# Patient Record
Sex: Female | Born: 1935
Health system: Southern US, Community
[De-identification: ages and names within clinical notes are randomized; demographics above are authoritative.]

## PROBLEM LIST (undated history)

## (undated) DIAGNOSIS — B029 Zoster without complications: Secondary | ICD-10-CM

## (undated) DIAGNOSIS — M25519 Pain in unspecified shoulder: Secondary | ICD-10-CM

## (undated) DIAGNOSIS — A159 Respiratory tuberculosis unspecified: Secondary | ICD-10-CM

## (undated) DIAGNOSIS — D126 Benign neoplasm of colon, unspecified: Secondary | ICD-10-CM

## (undated) DIAGNOSIS — D509 Iron deficiency anemia, unspecified: Secondary | ICD-10-CM

## (undated) DIAGNOSIS — Z9849 Cataract extraction status, unspecified eye: Secondary | ICD-10-CM

## (undated) DIAGNOSIS — Z5189 Encounter for other specified aftercare: Secondary | ICD-10-CM

## (undated) DIAGNOSIS — M1990 Unspecified osteoarthritis, unspecified site: Secondary | ICD-10-CM

## (undated) DIAGNOSIS — K219 Gastro-esophageal reflux disease without esophagitis: Secondary | ICD-10-CM

## (undated) DIAGNOSIS — J45909 Unspecified asthma, uncomplicated: Secondary | ICD-10-CM

## (undated) DIAGNOSIS — K279 Peptic ulcer, site unspecified, unspecified as acute or chronic, without hemorrhage or perforation: Secondary | ICD-10-CM

## (undated) DIAGNOSIS — M545 Low back pain: Secondary | ICD-10-CM

## (undated) DIAGNOSIS — J301 Allergic rhinitis due to pollen: Secondary | ICD-10-CM

## (undated) DIAGNOSIS — Z8719 Personal history of other diseases of the digestive system: Secondary | ICD-10-CM

## (undated) DIAGNOSIS — E785 Hyperlipidemia, unspecified: Secondary | ICD-10-CM

## (undated) DIAGNOSIS — H905 Unspecified sensorineural hearing loss: Secondary | ICD-10-CM

## (undated) DIAGNOSIS — I10 Essential (primary) hypertension: Secondary | ICD-10-CM

## (undated) DIAGNOSIS — Z9189 Other specified personal risk factors, not elsewhere classified: Secondary | ICD-10-CM

## (undated) DIAGNOSIS — F419 Anxiety disorder, unspecified: Secondary | ICD-10-CM

## (undated) DIAGNOSIS — L659 Nonscarring hair loss, unspecified: Secondary | ICD-10-CM

## (undated) DIAGNOSIS — M25549 Pain in joints of unspecified hand: Secondary | ICD-10-CM

## (undated) DIAGNOSIS — Z9079 Acquired absence of other genital organ(s): Secondary | ICD-10-CM

## (undated) HISTORY — DX: Unspecified osteoarthritis, unspecified site: M19.90

## (undated) HISTORY — DX: Peptic ulcer, site unspecified, unspecified as acute or chronic, without hemorrhage or perforation: K27.9

## (undated) HISTORY — DX: Other specified personal risk factors, not elsewhere classified: Z91.89

## (undated) HISTORY — PX: POLYPECTOMY: SHX149

## (undated) HISTORY — DX: Benign neoplasm of colon, unspecified: D12.6

## (undated) HISTORY — DX: Iron deficiency anemia, unspecified: D50.9

## (undated) HISTORY — DX: Cataract extraction status, unspecified eye: Z98.49

## (undated) HISTORY — DX: Zoster without complications: B02.9

## (undated) HISTORY — DX: Respiratory tuberculosis unspecified: A15.9

## (undated) HISTORY — DX: Anxiety disorder, unspecified: F41.9

## (undated) HISTORY — PX: BREAST SURGERY: SHX581

## (undated) HISTORY — DX: Encounter for other specified aftercare: Z51.89

## (undated) HISTORY — DX: Pain in joints of unspecified hand: M25.549

## (undated) HISTORY — DX: Gastro-esophageal reflux disease without esophagitis: K21.9

## (undated) HISTORY — DX: Allergic rhinitis due to pollen: J30.1

## (undated) HISTORY — DX: Pain in unspecified shoulder: M25.519

## (undated) HISTORY — PX: COLONOSCOPY: SHX174

## (undated) HISTORY — DX: Unspecified asthma, uncomplicated: J45.909

## (undated) HISTORY — DX: Personal history of other diseases of the digestive system: Z87.19

## (undated) HISTORY — DX: Low back pain: M54.5

## (undated) HISTORY — DX: Unspecified sensorineural hearing loss: H90.5

## (undated) HISTORY — DX: Hyperlipidemia, unspecified: E78.5

## (undated) HISTORY — PX: TOTAL ABDOMINAL HYSTERECTOMY W/ BILATERAL SALPINGOOPHORECTOMY: SHX83

## (undated) HISTORY — PX: CATARACT EXTRACTION: SUR2

## (undated) HISTORY — DX: Essential (primary) hypertension: I10

## (undated) HISTORY — PX: ABDOMINAL HYSTERECTOMY: SHX81

## (undated) HISTORY — PX: IRRIGATION AND DEBRIDEMENT SEBACEOUS CYST: SHX5255

## (undated) HISTORY — PX: EYE SURGERY: SHX253

## (undated) HISTORY — DX: Acquired absence of other genital organ(s): Z90.79

## (undated) HISTORY — DX: Nonscarring hair loss, unspecified: L65.9

---

## 1995-06-16 ENCOUNTER — Encounter: Payer: Self-pay | Admitting: Internal Medicine

## 1998-03-05 ENCOUNTER — Other Ambulatory Visit: Admission: RE | Admit: 1998-03-05 | Discharge: 1998-03-05 | Payer: Self-pay | Admitting: *Deleted

## 1999-06-03 ENCOUNTER — Other Ambulatory Visit: Admission: RE | Admit: 1999-06-03 | Discharge: 1999-06-03 | Payer: Self-pay | Admitting: *Deleted

## 2000-08-25 ENCOUNTER — Other Ambulatory Visit: Admission: RE | Admit: 2000-08-25 | Discharge: 2000-08-25 | Payer: Self-pay | Admitting: *Deleted

## 2002-09-21 ENCOUNTER — Other Ambulatory Visit: Admission: RE | Admit: 2002-09-21 | Discharge: 2002-09-21 | Payer: Self-pay | Admitting: *Deleted

## 2003-09-16 ENCOUNTER — Emergency Department (HOSPITAL_COMMUNITY): Admission: EM | Admit: 2003-09-16 | Discharge: 2003-09-16 | Payer: Self-pay | Admitting: Emergency Medicine

## 2003-10-02 ENCOUNTER — Other Ambulatory Visit: Admission: RE | Admit: 2003-10-02 | Discharge: 2003-10-02 | Payer: Self-pay | Admitting: *Deleted

## 2004-04-28 ENCOUNTER — Emergency Department (HOSPITAL_COMMUNITY): Admission: EM | Admit: 2004-04-28 | Discharge: 2004-04-28 | Payer: Self-pay | Admitting: Emergency Medicine

## 2004-10-28 ENCOUNTER — Other Ambulatory Visit: Admission: RE | Admit: 2004-10-28 | Discharge: 2004-10-28 | Payer: Self-pay | Admitting: *Deleted

## 2004-11-01 ENCOUNTER — Ambulatory Visit: Payer: Self-pay | Admitting: Internal Medicine

## 2004-11-04 ENCOUNTER — Ambulatory Visit: Payer: Self-pay | Admitting: Internal Medicine

## 2004-12-05 ENCOUNTER — Ambulatory Visit: Payer: Self-pay | Admitting: Internal Medicine

## 2005-01-01 ENCOUNTER — Ambulatory Visit: Payer: Self-pay | Admitting: Internal Medicine

## 2005-02-20 ENCOUNTER — Ambulatory Visit: Payer: Self-pay | Admitting: Internal Medicine

## 2005-06-26 ENCOUNTER — Ambulatory Visit: Payer: Self-pay | Admitting: Internal Medicine

## 2005-07-11 ENCOUNTER — Ambulatory Visit: Payer: Self-pay | Admitting: Internal Medicine

## 2005-09-11 ENCOUNTER — Ambulatory Visit: Payer: Self-pay | Admitting: Internal Medicine

## 2005-11-11 ENCOUNTER — Ambulatory Visit: Payer: Self-pay | Admitting: Internal Medicine

## 2005-11-17 ENCOUNTER — Ambulatory Visit: Payer: Self-pay | Admitting: Internal Medicine

## 2006-09-04 ENCOUNTER — Ambulatory Visit: Payer: Self-pay | Admitting: Internal Medicine

## 2006-09-16 ENCOUNTER — Ambulatory Visit: Payer: Self-pay | Admitting: Internal Medicine

## 2006-12-01 DIAGNOSIS — H269 Unspecified cataract: Secondary | ICD-10-CM

## 2006-12-01 HISTORY — DX: Unspecified cataract: H26.9

## 2006-12-30 ENCOUNTER — Ambulatory Visit: Payer: Self-pay | Admitting: Internal Medicine

## 2006-12-30 LAB — CONVERTED CEMR LAB
ALT: 18 units/L (ref 0–40)
AST: 21 units/L (ref 0–37)
Cholesterol: 150 mg/dL (ref 0–200)
Creatinine, Ser: 0.8 mg/dL (ref 0.4–1.2)
HDL: 44.5 mg/dL (ref >39.0)
LDL Cholesterol: 91 mg/dL (ref 0–99)
Total CHOL/HDL Ratio: 3.4
VLDL: 14 mg/dL (ref 0–40)

## 2007-01-04 ENCOUNTER — Ambulatory Visit: Payer: Self-pay

## 2007-02-04 ENCOUNTER — Other Ambulatory Visit: Admission: RE | Admit: 2007-02-04 | Discharge: 2007-02-04 | Payer: Self-pay | Admitting: *Deleted

## 2007-03-17 ENCOUNTER — Ambulatory Visit: Payer: Self-pay | Admitting: Internal Medicine

## 2007-09-10 ENCOUNTER — Encounter: Payer: Self-pay | Admitting: *Deleted

## 2007-09-10 DIAGNOSIS — J301 Allergic rhinitis due to pollen: Secondary | ICD-10-CM

## 2007-09-10 DIAGNOSIS — Z9189 Other specified personal risk factors, not elsewhere classified: Secondary | ICD-10-CM

## 2007-09-10 DIAGNOSIS — Z8719 Personal history of other diseases of the digestive system: Secondary | ICD-10-CM

## 2007-09-10 DIAGNOSIS — Z9849 Cataract extraction status, unspecified eye: Secondary | ICD-10-CM

## 2007-09-10 DIAGNOSIS — H905 Unspecified sensorineural hearing loss: Secondary | ICD-10-CM | POA: Insufficient documentation

## 2007-09-10 DIAGNOSIS — K219 Gastro-esophageal reflux disease without esophagitis: Secondary | ICD-10-CM

## 2007-09-10 DIAGNOSIS — I10 Essential (primary) hypertension: Secondary | ICD-10-CM | POA: Insufficient documentation

## 2007-09-10 DIAGNOSIS — D509 Iron deficiency anemia, unspecified: Secondary | ICD-10-CM

## 2007-09-10 DIAGNOSIS — E782 Mixed hyperlipidemia: Secondary | ICD-10-CM | POA: Insufficient documentation

## 2007-09-10 DIAGNOSIS — K279 Peptic ulcer, site unspecified, unspecified as acute or chronic, without hemorrhage or perforation: Secondary | ICD-10-CM

## 2007-09-10 DIAGNOSIS — D126 Benign neoplasm of colon, unspecified: Secondary | ICD-10-CM

## 2007-09-10 DIAGNOSIS — Z9079 Acquired absence of other genital organ(s): Secondary | ICD-10-CM | POA: Insufficient documentation

## 2007-09-10 DIAGNOSIS — E785 Hyperlipidemia, unspecified: Secondary | ICD-10-CM

## 2007-09-10 HISTORY — DX: Peptic ulcer, site unspecified, unspecified as acute or chronic, without hemorrhage or perforation: K27.9

## 2007-09-10 HISTORY — DX: Acquired absence of other genital organ(s): Z90.79

## 2007-09-10 HISTORY — DX: Hyperlipidemia, unspecified: E78.5

## 2007-09-10 HISTORY — DX: Other specified personal risk factors, not elsewhere classified: Z91.89

## 2007-09-10 HISTORY — DX: Cataract extraction status, unspecified eye: Z98.49

## 2007-09-10 HISTORY — DX: Unspecified sensorineural hearing loss: H90.5

## 2007-09-10 HISTORY — DX: Benign neoplasm of colon, unspecified: D12.6

## 2007-09-10 HISTORY — DX: Essential (primary) hypertension: I10

## 2007-09-10 HISTORY — DX: Personal history of other diseases of the digestive system: Z87.19

## 2007-09-10 HISTORY — DX: Allergic rhinitis due to pollen: J30.1

## 2007-09-10 HISTORY — DX: Iron deficiency anemia, unspecified: D50.9

## 2007-09-23 ENCOUNTER — Ambulatory Visit: Payer: Self-pay | Admitting: Internal Medicine

## 2007-11-08 ENCOUNTER — Ambulatory Visit: Payer: Self-pay | Admitting: Internal Medicine

## 2008-01-10 ENCOUNTER — Telehealth (INDEPENDENT_AMBULATORY_CARE_PROVIDER_SITE_OTHER): Payer: Self-pay | Admitting: Family Medicine

## 2008-01-11 ENCOUNTER — Telehealth (INDEPENDENT_AMBULATORY_CARE_PROVIDER_SITE_OTHER): Payer: Self-pay | Admitting: *Deleted

## 2008-01-12 ENCOUNTER — Ambulatory Visit: Payer: Self-pay | Admitting: Endocrinology

## 2008-01-14 ENCOUNTER — Telehealth: Payer: Self-pay | Admitting: Internal Medicine

## 2008-02-12 ENCOUNTER — Ambulatory Visit: Payer: Self-pay | Admitting: Family Medicine

## 2008-02-12 DIAGNOSIS — M25519 Pain in unspecified shoulder: Secondary | ICD-10-CM

## 2008-02-12 HISTORY — DX: Pain in unspecified shoulder: M25.519

## 2008-03-01 ENCOUNTER — Ambulatory Visit: Payer: Self-pay | Admitting: Internal Medicine

## 2008-03-01 DIAGNOSIS — M25549 Pain in joints of unspecified hand: Secondary | ICD-10-CM

## 2008-03-01 HISTORY — DX: Pain in joints of unspecified hand: M25.549

## 2008-03-01 LAB — CONVERTED CEMR LAB
ALT: 16 units/L (ref 0–35)
Albumin: 3.8 g/dL (ref 3.5–5.2)
Alkaline Phosphatase: 143 units/L — ABNORMAL HIGH (ref 39–117)
Calcium: 9.1 mg/dL (ref 8.4–10.5)
Chloride: 109 meq/L (ref 96–112)
Creatinine, Ser: 0.8 mg/dL (ref 0.4–1.2)
Eosinophils Absolute: 0.1 10*3/uL (ref 0.0–0.7)
Eosinophils Relative: 1.9 % (ref 0.0–5.0)
HCT: 39 % (ref 36.0–46.0)
HDL: 41.1 mg/dL (ref >39.0)
Hemoglobin: 12.5 g/dL (ref 12.0–15.0)
LDL Cholesterol: 99 mg/dL (ref 0–99)
Lymphocytes Relative: 33.8 % (ref 12.0–46.0)
RBC: 4.08 M/uL (ref 3.87–5.11)
Sodium: 144 meq/L (ref 135–145)
TSH: 0.81 microintl units/mL (ref 0.35–5.50)
Total Protein: 7.4 g/dL (ref 6.0–8.3)
WBC: 4.3 10*3/uL — ABNORMAL LOW (ref 4.5–10.5)

## 2008-03-02 ENCOUNTER — Encounter: Payer: Self-pay | Admitting: Internal Medicine

## 2008-03-21 ENCOUNTER — Encounter: Payer: Self-pay | Admitting: Internal Medicine

## 2008-03-22 ENCOUNTER — Other Ambulatory Visit: Admission: RE | Admit: 2008-03-22 | Discharge: 2008-03-22 | Payer: Self-pay | Admitting: Gynecology

## 2008-03-23 ENCOUNTER — Encounter: Payer: Self-pay | Admitting: Internal Medicine

## 2008-05-10 ENCOUNTER — Ambulatory Visit: Payer: Self-pay | Admitting: Internal Medicine

## 2008-05-10 DIAGNOSIS — M545 Low back pain, unspecified: Secondary | ICD-10-CM | POA: Insufficient documentation

## 2008-05-10 HISTORY — DX: Low back pain, unspecified: M54.50

## 2008-05-10 LAB — CONVERTED CEMR LAB
Bacteria, UA: NEGATIVE
Bilirubin Urine: NEGATIVE
Crystals: NEGATIVE
Ketones, ur: NEGATIVE mg/dL
Leukocytes, UA: NEGATIVE
Mucus, UA: NEGATIVE
Specific Gravity, Urine: 1.02 (ref 1.000–1.03)
Urine Glucose: NEGATIVE mg/dL
Urobilinogen, UA: 0.2 (ref 0.0–1.0)
pH: 6 (ref 5.0–8.0)

## 2008-06-09 ENCOUNTER — Ambulatory Visit: Payer: Self-pay | Admitting: Internal Medicine

## 2008-07-27 ENCOUNTER — Telehealth: Payer: Self-pay | Admitting: Internal Medicine

## 2008-07-28 ENCOUNTER — Ambulatory Visit: Payer: Self-pay | Admitting: Internal Medicine

## 2008-08-11 ENCOUNTER — Telehealth: Payer: Self-pay | Admitting: Internal Medicine

## 2008-09-10 ENCOUNTER — Telehealth: Payer: Self-pay | Admitting: Family Medicine

## 2008-09-12 ENCOUNTER — Ambulatory Visit: Payer: Self-pay | Admitting: Internal Medicine

## 2008-09-27 ENCOUNTER — Telehealth: Payer: Self-pay | Admitting: Internal Medicine

## 2008-09-28 ENCOUNTER — Ambulatory Visit: Payer: Self-pay | Admitting: Internal Medicine

## 2008-10-05 ENCOUNTER — Ambulatory Visit: Payer: Self-pay | Admitting: Internal Medicine

## 2008-10-19 ENCOUNTER — Telehealth: Payer: Self-pay | Admitting: Internal Medicine

## 2008-10-23 ENCOUNTER — Ambulatory Visit: Payer: Self-pay | Admitting: Internal Medicine

## 2008-10-23 ENCOUNTER — Encounter: Payer: Self-pay | Admitting: Internal Medicine

## 2008-10-23 LAB — HM COLONOSCOPY

## 2008-10-25 ENCOUNTER — Encounter: Payer: Self-pay | Admitting: Internal Medicine

## 2008-10-28 ENCOUNTER — Telehealth: Payer: Self-pay | Admitting: Internal Medicine

## 2008-10-30 ENCOUNTER — Telehealth: Payer: Self-pay | Admitting: Internal Medicine

## 2009-03-21 ENCOUNTER — Ambulatory Visit: Payer: Self-pay | Admitting: Internal Medicine

## 2009-03-21 DIAGNOSIS — L659 Nonscarring hair loss, unspecified: Secondary | ICD-10-CM | POA: Insufficient documentation

## 2009-03-21 HISTORY — DX: Nonscarring hair loss, unspecified: L65.9

## 2009-03-21 LAB — CONVERTED CEMR LAB
AST: 19 units/L (ref 0–37)
Albumin: 4.1 g/dL (ref 3.5–5.2)
Alkaline Phosphatase: 154 units/L — ABNORMAL HIGH (ref 39–117)
Basophils Relative: 0.3 % (ref 0.0–3.0)
Chloride: 107 meq/L (ref 96–112)
Eosinophils Absolute: 0.1 10*3/uL (ref 0.0–0.7)
GFR calc non Af Amer: 105.18 mL/min (ref >60)
HCT: 39.5 % (ref 36.0–46.0)
HDL: 50.8 mg/dL (ref >39.00)
Hemoglobin: 13.8 g/dL (ref 12.0–15.0)
Iron: 94 ug/dL (ref 42–145)
LDL Cholesterol: 88 mg/dL (ref 0–99)
Lymphocytes Relative: 33.7 % (ref 12.0–46.0)
Lymphs Abs: 1.6 10*3/uL (ref 0.7–4.0)
Monocytes Absolute: 0.3 10*3/uL (ref 0.1–1.0)
Monocytes Relative: 6.7 % (ref 3.0–12.0)
Neutro Abs: 2.8 10*3/uL (ref 1.4–7.7)
Saturation Ratios: 21.9 % (ref 20.0–50.0)
Total Bilirubin: 0.7 mg/dL (ref 0.3–1.2)
Total CHOL/HDL Ratio: 3
Triglycerides: 61 mg/dL (ref 0.0–149.0)
VLDL: 12.2 mg/dL (ref 0.0–40.0)

## 2009-03-22 ENCOUNTER — Encounter: Payer: Self-pay | Admitting: Internal Medicine

## 2009-03-22 ENCOUNTER — Encounter (INDEPENDENT_AMBULATORY_CARE_PROVIDER_SITE_OTHER): Payer: Self-pay | Admitting: *Deleted

## 2009-05-29 ENCOUNTER — Telehealth: Payer: Self-pay | Admitting: Internal Medicine

## 2009-08-10 ENCOUNTER — Telehealth: Payer: Self-pay | Admitting: Internal Medicine

## 2009-08-13 ENCOUNTER — Ambulatory Visit: Payer: Self-pay | Admitting: Internal Medicine

## 2009-08-13 DIAGNOSIS — B029 Zoster without complications: Secondary | ICD-10-CM

## 2009-08-13 HISTORY — DX: Zoster without complications: B02.9

## 2009-08-24 ENCOUNTER — Ambulatory Visit: Payer: Self-pay | Admitting: Internal Medicine

## 2009-09-11 ENCOUNTER — Ambulatory Visit: Payer: Self-pay | Admitting: Internal Medicine

## 2010-01-02 ENCOUNTER — Telehealth: Payer: Self-pay | Admitting: Internal Medicine

## 2010-01-31 ENCOUNTER — Telehealth: Payer: Self-pay | Admitting: Internal Medicine

## 2010-04-25 ENCOUNTER — Encounter: Payer: Self-pay | Admitting: Internal Medicine

## 2010-04-25 LAB — HM MAMMOGRAPHY: HM Mammogram: NORMAL

## 2010-05-01 ENCOUNTER — Telehealth: Payer: Self-pay | Admitting: Internal Medicine

## 2010-05-06 ENCOUNTER — Telehealth: Payer: Self-pay | Admitting: Internal Medicine

## 2010-05-17 ENCOUNTER — Ambulatory Visit: Payer: Self-pay | Admitting: Internal Medicine

## 2010-05-17 LAB — CONVERTED CEMR LAB
Albumin: 4.3 g/dL (ref 3.5–5.2)
Alkaline Phosphatase: 136 units/L — ABNORMAL HIGH (ref 39–117)
Basophils Relative: 1.1 % (ref 0.0–3.0)
Bilirubin, Direct: 0.2 mg/dL (ref 0.0–0.3)
Cholesterol: 155 mg/dL (ref 0–200)
Creatinine, Ser: 0.7 mg/dL (ref 0.4–1.2)
HDL: 49.9 mg/dL (ref >39.00)
Lymphocytes Relative: 34.6 % (ref 12.0–46.0)
Lymphs Abs: 1.7 10*3/uL (ref 0.7–4.0)
Neutro Abs: 2.6 10*3/uL (ref 1.4–7.7)
Neutrophils Relative %: 55.3 % (ref 43.0–77.0)
Platelets: 204 10*3/uL (ref 150.0–400.0)
Potassium: 4.2 meq/L (ref 3.5–5.1)
RBC: 3.99 M/uL (ref 3.87–5.11)
Sodium: 143 meq/L (ref 135–145)
Triglycerides: 56 mg/dL (ref 0.0–149.0)
VLDL: 11.2 mg/dL (ref 0.0–40.0)
WBC: 4.8 10*3/uL (ref 4.5–10.5)

## 2010-08-15 ENCOUNTER — Telehealth: Payer: Self-pay | Admitting: Internal Medicine

## 2010-09-24 ENCOUNTER — Ambulatory Visit: Payer: Self-pay | Admitting: Internal Medicine

## 2010-12-24 ENCOUNTER — Telehealth: Payer: Self-pay | Admitting: Internal Medicine

## 2011-01-02 NOTE — Progress Notes (Signed)
  Phone Note Refill Request Message from:  Fax from Pharmacy on December 24, 2010 10:54 AM  Refills Requested: Medication #1:  DIOVAN 320 MG  TABS Take one tablet once daily Initial call taken by: Ami Bullins CMA,  December 24, 2010 10:54 AM    Prescriptions: DIOVAN 320 MG  TABS (VALSARTAN) Take one tablet once daily  #90 x 3   Entered by:   Ami Bullins CMA   Authorized by:   Jacques Navy MD   Signed by:   Bill Salinas CMA on 12/24/2010   Method used:   Electronically to        [facility] (retail)       [address]       [place], [place]  [address]       Ph: [phone]       Fax: [phone]   RxID:   [id number]

## 2011-01-02 NOTE — Progress Notes (Signed)
    Preventive Care Screening  Mammogram:    Date:  04/25/2010    Results:  normal bilateral

## 2011-01-02 NOTE — Progress Notes (Signed)
  Phone Note Refill Request Message from:  Fax from Pharmacy on January 31, 2010 10:32 AM  Refills Requested: Medication #1:  XANAX 0.5 MG  TABS Take 1/2 tablets 3 times as day as needed   Supply Requested: 3 months   Last Refilled: 10/31/2008 Recieved refill request from [facility], pt did have 5 refills on medication but the refills have expired. Please Advise Refill?  Initial call taken by: Ami Bullins CMA,  January 31, 2010 10:33 AM  Follow-up for Phone Call        yes Follow-up by: Etta Grandchild MD,  January 31, 2010 11:30 AM    Prescriptions: Prudy Feeler 0.5 MG  TABS (ALPRAZOLAM) Take 1/2 tablets 3 times as day as needed  #100 x 5   Entered by:   Ami Bullins CMA   Authorized by:   Jacques Navy MD   Signed by:   Bill Salinas CMA on 01/31/2010   Method used:   Telephoned to ...       [facility] (retail)       [address]       [place], [place]  [address]       Ph: [phone]       Fax: [phone]   RxID:   [id number]

## 2011-01-02 NOTE — Assessment & Plan Note (Signed)
Summary: FLU VAC  PER [facility]  Nurse Visit   Allergies: No Known Drug Allergies  Orders Added: 1)  Flu Vaccine 29yrs + MEDICARE PATIENTS [Q2039] 2)  Administration Flu vaccine - MCR [G0008] Flu Vaccine Consent Questions     Do you have a history of severe allergic reactions to this vaccine? no    Any prior history of allergic reactions to egg and/or gelatin? no    Do you have a sensitivity to the preservative Thimersol? no    Do you have a past history of Guillan-Barre Syndrome? no    Do you currently have an acute febrile illness? no    Have you ever had a severe reaction to latex? no    Vaccine information given and explained to patient? yes    Are you currently pregnant? no    Lot Number:[id number]   Exp Date:05/31/2011   Site Given  Left Deltoid IM1

## 2011-01-02 NOTE — Progress Notes (Signed)
Summary: Furosemide refill  Phone Note Refill Request   Refills Requested: Medication #1:  LASIX 40 MG  TABS Take one tablet once daily Patient called stating that she is completely out of this med. She does not have any for tomorrow, I made patient aware that we will refill.  Initial call taken by: Lucious Groves,  January 02, 2010 5:01 PM    Prescriptions: LASIX 40 MG  TABS (FUROSEMIDE) Take one tablet once daily  #30 x 6   Entered by:   Lucious Groves   Authorized by:   Jacques Navy MD   Signed by:   Lucious Groves on 01/02/2010   Method used:   Faxed to ...       [facility] (retail)       [address]       [place], [place]  [address]       Ph: [phone]       Fax: [phone]   RxID:   [id number]

## 2011-01-02 NOTE — Assessment & Plan Note (Signed)
Summary: yearly f/u medicare/#/cd   Vital Signs:  Patient profile:   77 year old female Height:      63 inches Weight:      168 pounds BMI:     29.87 O2 Sat:      97 % on Room air Temp:     98.7 degrees F oral Pulse rate:   73 / minute BP sitting:   130 / 62  (left arm) Cuff size:   regular  Vitals Entered By: Bill Salinas CMA (May 17, 2010 1:32 PM)  O2 Flow:  Room air CC: cpx/ ab   Primary Care Provider:  Jacques Navy MD  CC:  cpx/ ab.  History of Present Illness: Patient presents for routine medical follow-up. She is feeling well. She does c/o hand and shoulder pain with a morning gel period, shortened with warm water immersion.  She had gyn exam several weeks ago that was normal. She had a mammogram in May that was normal. Chart reviewed - last EKG '08 and this was normal. Had colonoscopy in '09. Is due for tetnus. She has had shingles thus no indication for shingles vaccine.   Current Medications (verified): 1)  Climara 0.05 Mg/24hr  Ptwk (Estradiol) .... Take As Directed 2)  Lipitor 20 Mg  Tabs (Atorvastatin Calcium) .... Take One Tablet Once Daily 3)  Citrucel   Powd (Methylcellulose (Laxative)) .... Take Daily 4)  Lasix 40 Mg  Tabs (Furosemide) .... Take One Tablet Once Daily 5)  Diovan 320 Mg  Tabs (Valsartan) .... Take One Tablet Once Daily 6)  Ranitidine Hcl 150 Mg  Tabs (Ranitidine Hcl) .... Take 2 Tablets As Needed 7)  Meclizine Hcl 25 Mg  Tabs (Meclizine Hcl) .... Take 1 Tablet By Mouth Every 6 Hours As Needed 8)  Xanax 0.5 Mg  Tabs (Alprazolam) .... Take 1/2 Tablets 3 Times As Day As Needed 9)  Tylenol Extra Strength 500 Mg  Tabs (Acetaminophen) .... As Needed 10)  Mylanta 200-200-20 Mg/6ml  Susp (Alum & Mag Hydroxide-Simeth) .... As Needed 11)  Acyclovir 400 Mg Tabs (Acyclovir) .... 2 Tab 5 Times A Day 12)  Clotrimazole-Betamethasone 1-0.05 % Crea (Clotrimazole-Betamethasone) .... Apply Three Times A Day To Rash On Thig  Allergies (verified): No Known  Drug Allergies  Past History:  Past Medical History: Last updated: 08/13/2009 DYSPEPSIA, HX OF (ICD-V12.79) Hx of POLYP, COLON (ICD-211.3) PEPTIC ULCER DISEASE (ICD-533.90) HAY FEVER (ICD-477.0) ANEMIA, IRON DEFICIENCY (ICD-280.9) HEARING LOSS, SENSORINEURAL (ICD-389.10) HYPERLIPIDEMIA (ICD-272.4)  hypertension  Past Surgical History: Last updated: 09/10/2007 POLYPECTOMY, HX OF (ICD-V15.9) CATARACT EXTRACTION, HX OF (ICD-V45.61) SEBACEOUS CYST,I&D OF (ICD-706.2) TAH/BSO, HX OF (ICD-V45.77)  Family History: Last updated: March 05, 2008 father- deceased @ 68's acute mother-deceased @88 ; HTN, CAD/CHF Neg-breast or colon cancer; DM  Social History: Last updated: 03-05-2008 HSG married '52 3 sons - '54, '59, '69; 1 daughter '57; 4 grandchildren 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 SO with multiple medical problems.  Risk Factors: Caffeine Use: decaf tea (03/21/2009) Exercise: no (03/21/2009)  Risk Factors: Smoking Status: never (03/21/2009)  Review of Systems       The patient complains of decreased hearing.  The patient denies anorexia, fever, weight loss, weight gain, vision loss, hoarseness, chest pain, dyspnea on exertion, headaches, hemoptysis, abdominal pain, severe indigestion/heartburn, incontinence, suspicious skin lesions, difficulty walking, depression, abnormal bleeding, enlarged lymph nodes, and angioedema.         tinnitis  Physical Exam  General:  Overweight AA female in  no distress Head:  Normocephalic and atraumatic without obvious abnormalities. No apparent alopecia or balding. Eyes:  vision grossly intact, pupils equal, pupils round, and no injection.  Slight opacity left eye lens. Fundiscopic exam normal Ears:  Cerumen bilaterally Nose:  no external deformity and no external erythema.   Mouth:  full upper denture, partial lower. No buccal lesions Neck:  supple, full ROM, no thyromegaly, and no carotid bruits.    Chest Wall:  No deformities, masses, or tenderness noted. Breasts:  deferred Lungs:  Normal respiratory effort, chest expands symmetrically. Lungs are clear to auscultation, no crackles or wheezes. Heart:  Normal rate and regular rhythm. S1 and S2 normal without gallop, murmur, click, rub or other extra sounds. Abdomen:  soft, non-tender, normal bowel sounds, no distention, and no hepatomegaly.   Genitalia:  deferred to gyn Msk:  normal ROM, no joint tenderness, no joint swelling, no joint warmth, and no joint deformities.   Pulses:  2+ radial and DP pulses Extremities:  No clubbing, cyanosis, edema, or deformity noted with normal full range of motion of all joints.   Neurologic:  alert & oriented X3, cranial nerves II-XII intact, gait normal, and DTRs symmetrical and normal.   Skin:  turgor normal, color normal, no rashes, and no ulcerations.   Cervical Nodes:  no anterior cervical adenopathy.   Psych:  Oriented X3, memory intact for recent and remote, normally interactive, good eye contact, and not anxious appearing.     Impression & Recommendations:  Problem # 1:  PAIN IN JOINT, HAND (ICD-719.44) Chronic problem with some progression. No visible joint damage on exam  Plan - warm water immersion or hot showers for the stiffness           over the counter alleve or ibuprofen as needed  Problem # 2:  HYPERTENSION (ICD-401.9)  Her updated medication list for this problem includes:    Lasix 40 Mg Tabs (Furosemide) .Marland Kitchen... Take one tablet once daily    Diovan 320 Mg Tabs (Valsartan) .Marland Kitchen... Take one tablet once daily  Orders: Prescription Created Electronically [phone]) TLB-BMP (Basic Metabolic Panel-BMET) (80048-METABOL)  BP today: 130/62 Prior BP: 144/62 (08/24/2009)  Good control of BP on present medications. Plan - continue present medications.   Problem # 3:  PEPTIC ULCER DISEASE (ICD-533.90) Stable with no complaint of pain. She continues to take Ranitidine.  Her updated  medication list for this problem includes:    Ranitidine Hcl 150 Mg Tabs (Ranitidine hcl) .Marland Kitchen... Take 2 tablets as needed    Mylanta 200-200-20 Mg/1ml Susp (Alum & mag hydroxide-simeth) .Marland Kitchen... As needed  Problem # 4:  ANEMIA, IRON DEFICIENCY (ICD-280.9) Patient has no symptoms.   Plan - follow-up CBC.  Orders: TLB-CBC Platelet - w/Differential (85025-CBCD)  Addendum - Hgb 13 g - normal, no evidence of anemia  Problem # 5:  HYPERLIPIDEMIA (ICD-272.4) Continues on medication without complication.  Plan - routine follow-up lab. Recommendations to follow.   Her updated medication list for this problem includes:    Lipitor 20 Mg Tabs (Atorvastatin calcium) .Marland Kitchen... Take one tablet once daily  Orders: TLB-Lipid Panel (80061-LIPID) TLB-Hepatic/Liver Function Pnl (80076-HEPATIC) TLB-TSH (Thyroid Stimulating Hormone) (84443-TSH)  Addendum - LDL 94 - great control on present medication  Problem # 6:  Preventive Health Care (ICD-V70.0) Up to date with gyn, breast health, colorectal cancer screening. She has had no falls and has no fall risk. She has no depression - supportive family. Up to date with pneumovax and will have tetnus booster today.  She has had shingles and needs no zostavax.  In summary - a very nice woman who is medically stable. She will return as needed or in 1 year. Rx's renewed.  Complete Medication List: 1)  Lipitor 20 Mg Tabs (Atorvastatin calcium) .... Take one tablet once daily 2)  Citrucel Powd (Methylcellulose (laxative)) .... Take daily 3)  Lasix 40 Mg Tabs (Furosemide) .... Take one tablet once daily 4)  Diovan 320 Mg Tabs (Valsartan) .... Take one tablet once daily 5)  Ranitidine Hcl 150 Mg Tabs (Ranitidine hcl) .... Take 2 tablets as needed 6)  Meclizine Hcl 25 Mg Tabs (Meclizine hcl) .... Take 1 tablet by mouth every 6 hours as needed 7)  Xanax 0.5 Mg Tabs (Alprazolam) .... Take 1/2 tablets 3 times as day as needed 8)  Tylenol Extra Strength 500 Mg Tabs  (Acetaminophen) .... As needed 9)  Mylanta 200-200-20 Mg/57ml Susp (Alum & mag hydroxide-simeth) .... As needed 10)  Clotrimazole-betamethasone 1-0.05 % Crea (Clotrimazole-betamethasone) .... Apply three times a day to rash on thig  Other Orders: Subsequent annual wellness visit with prevention plan (Z6109) TD Toxoids IM 7 YR + (60454) Admin 1st Vaccine (09811)  Patient: Jamie [name] Note: All result statuses are Final unless otherwise noted.  Tests: (1) BMP (METABOL)   Sodium                    143 mEq/L                   135-145   Potassium                 4.2 mEq/L                   3.5-5.1   Chloride                  109 mEq/L                   96-112   Carbon Dioxide            27 mEq/L                    19-32   Glucose                   87 mg/dL                    91-47   BUN                       13 mg/dL                    8-29   Creatinine                0.7 mg/dL                   5.6-2.1   Calcium                   9.3 mg/dL                   3.0-86.5   GFR                       101.49 mL/min               >60  Tests: (2) CBC Platelet w/Diff (CBCD)  White Cell Count          4.8 K/uL                    4.5-10.5   Red Cell Count            3.99 Mil/uL                 3.87-5.11   Hemoglobin                13.0 g/dL                   16.1-09.6   Hematocrit                38.5 %                      36.0-46.0   MCV                       96.5 fl                     78.0-100.0   MCHC                      33.9 g/dL                   04.5-40.9   RDW                       14.1 %                      11.5-14.6   Platelet Count            204.0 K/uL                  150.0-400.0   Neutrophil %              55.3 %                      43.0-77.0   Lymphocyte %              34.6 %                      12.0-46.0   Monocyte %                6.1 %                       3.0-12.0   Eosinophils%              2.9 %                       0.0-5.0   Basophils %               1.1 %                        0.0-3.0   Neutrophill Absolute      2.6 K/uL                    1.4-7.7   Lymphocyte Absolute       1.7 K/uL  0.7-4.0   Monocyte Absolute         0.3 K/uL                    0.1-1.0  Eosinophils, Absolute                             0.1 K/uL                    0.0-0.7   Basophils Absolute        0.1 K/uL                    0.0-0.1  Tests: (3) Lipid Panel (LIPID)   Cholesterol               155 mg/dL                   6-010     ATP III Classification            Desirable:  < 200 mg/dL                    Borderline High:  200 - 239 mg/dL               High:  > = 240 mg/dL   Triglycerides             56.0 mg/dL                  9.3-235.5     Normal:  <150 mg/dL     Borderline High:  732 - 199 mg/dL   HDL                       20.25 mg/dL                 >42.70   VLDL Cholesterol          11.2 mg/dL                  6.2-37.6   LDL Cholesterol           94 mg/dL                    2-83  CHO/HDL Ratio:  CHD Risk                             3                    Men          Women     1/2 Average Risk     3.4          3.3     Average Risk          5.0          4.4     2X Average Risk          9.6          7.1     3X Average Risk          15.0          11.0                           Tests: (4) Hepatic/Liver Function Panel (HEPATIC)  Total Bilirubin           0.5 mg/dL                   1.6-1.0   Direct Bilirubin          0.2 mg/dL                   9.6-0.4   Alkaline Phosphatase [H]  136 U/L                     39-117   AST                       18 U/L                      0-37   ALT                       17 U/L                      0-35   Total Protein             7.6 g/dL                    5.4-0.9   Albumin                   4.3 g/dL                    8.1-1.9  Tests: (5) TSH (TSH)   FastTSH                   0.90 uIU/mL                 0.35-5.50Prescriptions: XANAX 0.5 MG  TABS (ALPRAZOLAM) Take 1/2 tablets 3 times as day as needed  #100 x 1    Entered and Authorized by:   Jacques Navy MD   Signed by:   Jacques Navy MD on 05/17/2010   Method used:   Handwritten   RxID:   [id number] CLOTRIMAZOLE-BETAMETHASONE 1-0.05 % CREA (CLOTRIMAZOLE-BETAMETHASONE) apply three times a day to rash on thig  #15g x 3   Entered and Authorized by:   Jacques Navy MD   Signed by:   Jacques Navy MD on 05/17/2010   Method used:   Faxed to ...       [facility] (retail)       [address]       [place], [place]  [address]       Ph: [phone]       Fax: [phone]   RxID:   [id number] RANITIDINE HCL 150 MG  TABS (RANITIDINE HCL) Take 2 tablets as needed  #180 x 3   Entered and Authorized by:   Jacques Navy MD   Signed by:   Jacques Navy MD on 05/17/2010   Method used:   Faxed to ...       [facility] (retail)       [address]       [place], [place]  [address]       Ph: [phone]       Fax: [phone]   RxID:   [id number]  LASIX 40 MG  TABS (FUROSEMIDE) Take one tablet once daily  #90 x 3   Entered and Authorized by:   Jacques Navy MD   Signed by:   Jacques Navy MD on 05/17/2010   Method used:   Faxed to ...       [facility] (retail)       [address]       [place], [place]  [address]       Ph: [phone]       Fax: [phone]   RxID:   [id number]    Immunizations Administered:  Tetanus Vaccine:    Vaccine Type: Td    Site: right deltoid    Mfr: Sanofi Pasteur    Dose: 0.5 ml    Route: IM    Given by: Ami Bullins CMA    Exp. Date: 12/27/2011    Lot #: [id number]    VIS given: 10/19/07 version given May 17, 2010.   Appended Document: yearly f/u medicare/#/cd Patient is fully independent in all ADLs and capable of independent function as well as being primary caregiver to her disabled husband. She is very conscious of safety issues and needs in her household for herself and  her husband.

## 2011-01-02 NOTE — Progress Notes (Signed)
  Phone Note Refill Request Message from:  Fax from Pharmacy on August 15, 2010 11:22 AM  Refills Requested: Medication #1:  XANAX 0.5 MG  TABS Take 1/2 tablets 3 times as day as needed   Last Refilled: 01/31/2010 Fax from [facility], please Advise refill  Initial call taken by: Ami Bullins CMA,  August 15, 2010 11:23 AM  Follow-up for Phone Call        ok x 5 Follow-up by: Jacques Navy MD,  August 15, 2010 4:01 PM    Prescriptions: Prudy Feeler 0.5 MG  TABS (ALPRAZOLAM) Take 1/2 tablets 3 times as day as needed  #100 x 5   Entered by:   Ami Bullins CMA   Authorized by:   Jacques Navy MD   Signed by:   Bill Salinas CMA on 08/15/2010   Method used:   Telephoned to ...       [facility] (retail)       [address]       [place], [place]  [address]       Ph: [phone]       Fax: [phone]   RxID:   [id number]

## 2011-01-02 NOTE — Progress Notes (Signed)
  Phone Note Refill Request Message from:  Fax from Pharmacy on May 06, 2010 10:19 AM  Refills Requested: Medication #1:  LIPITOR 20 MG  TABS Take one tablet once daily Initial call taken by: Ami Bullins CMA,  May 06, 2010 10:19 AM    Prescriptions: LIPITOR 20 MG  TABS (ATORVASTATIN CALCIUM) Take one tablet once daily  #90 x 3   Entered by:   Ami Bullins CMA   Authorized by:   Jacques Navy MD   Signed by:   Bill Salinas CMA on 05/06/2010   Method used:   Faxed to ...       [facility] (retail)       [address]       [place], [place]  [address]       Ph: [phone]       Fax: [phone]   RxID:   [id number]

## 2011-01-10 ENCOUNTER — Telehealth: Payer: Self-pay | Admitting: Internal Medicine

## 2011-01-22 NOTE — Progress Notes (Signed)
Summary: Meclizine  Phone Note Call from Patient Call back at Home Phone [phone] Call back at [phone]   Summary of Call: Patient is requesting refill of meclizine. She is c/o slight dizzyness and would like to try med. Is this med ok to take w/her other meds?  Initial call taken by: Lamar Sprinkles, CMA,  January 10, 2011 12:23 PM  Follow-up for Phone Call        ok for refill as needed  Follow-up by: Jacques Navy MD,  January 10, 2011 1:04 PM  Additional Follow-up for Phone Call Additional follow up Details #1::        # disconnected, will try again later....................Marland KitchenLamar Sprinkles, CMA  January 10, 2011 2:34 PM   unable to leave vm on # pt called on...............Marland KitchenLamar Sprinkles, CMA  January 10, 2011 6:37 PM   Pt informed  Additional Follow-up by: Lamar Sprinkles, CMA,  January 13, 2011 1:59 PM

## 2011-02-21 ENCOUNTER — Telehealth: Payer: Self-pay | Admitting: *Deleted

## 2011-02-21 NOTE — Telephone Encounter (Signed)
Received fax from [facility] in [place], Alprazolam 0.5 mg tablet SIG: take 1/2 tablet by mouth 3 times a day prn. Please Advise.

## 2011-02-21 NOTE — Telephone Encounter (Signed)
Ok for refills x 5 

## 2011-02-24 MED ORDER — ALPRAZOLAM 0.5 MG PO TABS: 0.5000 mg | ORAL_TABLET | Freq: Three times a day (TID) | ORAL | Status: DC

## 2011-02-24 MED ORDER — ALPRAZOLAM 0.5 MG PO TABS: 0.5000 mg | ORAL_TABLET | Freq: Three times a day (TID) | ORAL | Status: DC | PRN

## 2011-04-15 NOTE — Assessment & Plan Note (Signed)
[facility]                                 ON-CALL NOTE   NAME:Jamie [name]                  MRN:          [id number]  DATE:10/22/2008                            DOB:          11-Oct-1933    Phone # [phone]   PRIMARY CARE PHYSICIAN:  Rosalyn Gess. Norins, MD.   SUBJECTIVE:  Jamie [name] is calling because she has chronic intermittent  nausea and was given Phenergan by her primary MD.  She calls because she  is wondering if she can take this with her prep for her colonoscopy.  She is due to take Reglan and begin the prep between 3 and 5 tonight.   ASSESSMENT AND PLAN:  Recommended that if she goes ahead and takes the  Phenergan now she will be able to take it given that will be a good 6  hours before she needs to take the Reglan.  She was instructed not to  repeat the dose of Phenergan.     Kerby Nora, MD  Electronically Signed    AB/MedQ  DD: 10/22/2008  DT: 10/22/2008  Job #: [id number]

## 2011-04-18 NOTE — Assessment & Plan Note (Signed)
[facility]                           PRIMARY CARE OFFICE NOTE   NAME:[name], Jamie FONTANILLA                  MRN:          [id number]  DATE:12/30/2006                            DOB:          1933-06-08    Jamie [name] is a delightful 77 year old [place] American woman who  presents for follow up evaluation and exam.  She was last seen in the  office September 04, 2006, for dizziness.  In the interval, the patient  reports that she is having several mild problems.  1. Leg pain.  The patient reports that she will get discomfort in her      legs described as a tired heavy feeling with rapid recovery with      rest.  She is able to walk approximately 50 yards before her      symptoms develop.  2. Paresthesias.  The patient reports she has been having tingling in      her right hand affecting the first through third digits.  3. Tinnitus.  The patient is having ringing in both ears.  She reports      some decreased hearing, particularly of voice at high frequencies.   PAST MEDICAL HISTORY:  Surgical:  I&D of a sebaceous cyst in 1985.  TAHBSO in 1996.  Medical:  1. The usual childhood diseases.  2. Hyperlipidemia.  3. Sensory neural hearing loss.  4. History of colon polyps.  5. Iron deficiency anemia.  6. Hay fever.  7. History of peptic ulcer disease.   CURRENT MEDICATIONS:  Climara 0.05 mg patch twice weekly, Lipitor 20 mg  daily, Citrucel daily, Lasix 40 mg daily, Diovan 320 mg daily,  Ranitidine 150 mg b.i.d. p.r.n., Allegra D p.r.n., Meclizine 0.25 mg  p.r.n.   HEALTH MAINTENANCE:  Mammogram is scheduled for February 2008.  Pap  smear with her gynecologist is scheduled for February 2008.  Last  colonoscopy was April 2004 with follow up recommended in 2009.   The patient is a former smoker, she quit in 1992.  Last chest x-ray  January 17, 2003, was negative.   SOCIAL HISTORY:  The patient is happily married, her marriage is in good  health.   She has been married now for 52 years.  She has three sons and  one daughter.  She has many grandchildren.  She has a very supportive  family with whom she is close.   REVIEW OF SYMPTOMS:  The patient has had no fevers, sweats, chills,  weight change, or sleep problems.  Her last eye exam was November 2007.  She has had a cataract removed.  She does have some decreased vision and  is for laser surgery this summer.  No ENT problems.  Hearing as above.  No cardiovascular problem except for leg discomfort that is possibly  claudication.  No respiratory, GI, GU, or musculoskeletal complaints.   PHYSICAL EXAMINATION:  VITAL SIGNS:  Temperature 97.6, blood pressure  139/81, pulse 76, weight 169.  GENERAL:  This is a well nourished, mildly overweight, African American  woman in no acute distress.  HEENT:  Normocephalic, atraumatic.  The patient has  cerumen impactions  bilaterally which were irrigated without difficulty.  The patient has  full upper denture, partial lower denture.  She had no buccal  lesions.  Posterior pharynx was clear.  Conjunctivae and sclerae were clear.  The  patient has mild arcus senilis.  Pupils were equal, round, and reactive.  Funduscopic exam is deferred to ophthalmology.  NECK:  Supple without thyromegaly.  No lymphadenopathy was noted in the  cervical or supraclavicular regions.  CHEST:  No CVA tenderness.  LUNGS:  Clear with no rales, wheezes, or rhonchi.  CARDIOVASCULAR:  2+ radial pulse, she had a quiet precordium, she had no  JVD, no carotid bruits.  She had a regular rate and rhythm without  murmurs, gallops, and rubs.  She had 2+ dorsalis pedes pulses  bilaterally.  BREASTS:  Exam deferred to gynecology.  ABDOMEN:  Soft, no guarding, no rebound.  No organosplenomegaly was  noted.  PELVIC AND RECTAL:  Exams deferred to gynecology.  EXTREMITIES:  Without cyanosis, clubbing, edema, or deformity.  DERMATOLOGICAL:  The patient has no stasis changes, no  ulcers, no  lesions were noted.  NEUROLOGICAL:  Nonfocal.   DATA BASE:  A 12 lead electrocardiogram revealed a normal sinus rhythm  with no significant abnormalities noted.  Laboratory with normal  potassium at 3.3, creatinine was normal at 0.8, BUN was normal at 10,  SGOT normal at 21, SGPT normal at 18.  The patient had a cholesterol of  150, triglycerides 71, HDL 44.5, LDL 91.   ASSESSMENT/PLAN:  1. Hypertension.  The patient's blood pressure is adequately      controlled on her present medication and she will continue the      same.  2. Hyperlipidemia.  Excellent response to Lipitor, definitely at goal.      The patient will continue her present medications.  3. Claudication.  The patient gives a description of claudication.      She did have easily palpable distal pulses.  The patient is      scheduled for lower extremity exercise Doppler January 04, 2007.      The patient is aware of this appointment.  Will make      recommendations based on findings.  4. Neurological.  Patient with possible mild carpal tunnel syndrome.      She did have a positive Tinel's sign.  Her symptoms are mild.  At      this point, she is advised of the diagnosis and should consider      cock up wrist splints.  5. ENT.  Patient with mild tinnitus in both ears.  She does have      neurosensory neural hearing loss.  I expect that the cerumen      impaction impeded her hearing further and she should do well      following irrigation.  If she continues to have problems, we will      be happy to refer her back to ENT.  6. Health maintenance.  The patient stays current with Dr. Randell Patient for      gynecology.  She has an upcoming appointment in February.  She has      scheduled mammography with her last study being December 2006.  The      patient is up to date with colorectal cancer screening with      colonoscopy as noted.  She is for recall in 2009.  In summary, this is a very pleasant woman who seems to be  doing  well at  this time.  She will be notified as to the results of her Doppler study  when available.     Jamie Gess Norins, MD  Electronically Signed    MEN/MedQ  DD: 01/02/2007  DT: 01/02/2007  Job #: [id number]

## 2011-05-13 ENCOUNTER — Encounter: Payer: Self-pay | Admitting: Internal Medicine

## 2011-05-13 ENCOUNTER — Other Ambulatory Visit (INDEPENDENT_AMBULATORY_CARE_PROVIDER_SITE_OTHER): Payer: Medicare Other

## 2011-05-13 ENCOUNTER — Ambulatory Visit (INDEPENDENT_AMBULATORY_CARE_PROVIDER_SITE_OTHER): Payer: Medicare Other | Admitting: Internal Medicine

## 2011-05-13 VITALS — BP 142/82 | HR 84 | Temp 99.9°F | Ht 62.0 in | Wt 171.5 lb

## 2011-05-13 DIAGNOSIS — M7989 Other specified soft tissue disorders: Secondary | ICD-10-CM | POA: Insufficient documentation

## 2011-05-13 DIAGNOSIS — I10 Essential (primary) hypertension: Secondary | ICD-10-CM

## 2011-05-13 LAB — CBC WITH DIFFERENTIAL/PLATELET
Basophils Absolute: 0 10*3/uL (ref 0.0–0.1)
Eosinophils Absolute: 0.1 10*3/uL (ref 0.0–0.7)
Lymphocytes Relative: 31.1 % (ref 12.0–46.0)
MCHC: 33.9 g/dL (ref 30.0–36.0)
Monocytes Absolute: 0.5 10*3/uL (ref 0.1–1.0)
Neutrophils Relative %: 58 % (ref 43.0–77.0)
RDW: 13.7 % (ref 11.5–14.6)

## 2011-05-13 MED ORDER — VALSARTAN 320 MG PO TABS: 320.0000 mg | ORAL_TABLET | Freq: Every day | ORAL | Status: DC

## 2011-05-13 MED ORDER — FUROSEMIDE 40 MG PO TABS: 40.0000 mg | ORAL_TABLET | Freq: Every day | ORAL | Status: DC

## 2011-05-13 MED ORDER — DOXYCYCLINE HYCLATE 100 MG PO TABS: 100.0000 mg | ORAL_TABLET | Freq: Two times a day (BID) | ORAL | Status: AC

## 2011-05-13 NOTE — Assessment & Plan Note (Signed)
Third finger right hand with some swelling of the MCP palmar aspect only, without obvious trauma, no fever or pain, but with low grade temp and small fingertip blistering as well;  Diff includes overuse (marked tenosynovitis), cellulitis or gout;  For today will assume possible infection -tx with doxy course, check uric acid level (no hx of gout though) and cbc, with f/u Dr Debby Bud 1 wk, or sooner if needed

## 2011-05-13 NOTE — Progress Notes (Signed)
  Subjective:    Patient ID: Jamie [name], female    DOB: 04/28/1933, 77 y.o.   MRN: [id number]  HPI Pt here with unusual swelling only of the third finger right hand , mild to mod, for 5 days, rather sudden onset, not improving on its own, and assoc with blistering type small lesion to the distal finger tip, again without pain.   Pt denies fever, wt loss, night sweats, loss of appetite, or other constitutional symptoms  No hx of gout, recent trauma, or fever, chills.  She is right handed and tends to overuse the hand with taking care of her husband, especially with opening multiple pill bottles every day that she finds difficult.  Needs refill of lasix and diovan as well.  Pt denies chest pain, increased sob or doe, wheezing, orthopnea, PND, increased LE swelling, palpitations, dizziness or syncope.  Pt denies new neurological symptoms such as new headache, or facial or extremity weakness or numbness   Pt denies polydipsia, polyuria. Past Medical History  Diagnosis Date  . ANEMIA, IRON DEFICIENCY 09/10/2007  . CATARACT EXTRACTION, HX OF 09/10/2007  . DYSPEPSIA, HX OF 09/10/2007  . HAIR LOSS 03/21/2009  . HAY FEVER 09/10/2007  . HEARING LOSS, SENSORINEURAL 09/10/2007  . HYPERLIPIDEMIA 09/10/2007  . HYPERTENSION 09/10/2007  . LOW BACK PAIN 05/10/2008  . Pain in joint, hand 03/01/2008  . PEPTIC ULCER DISEASE 09/10/2007  . POLYP, COLON 09/10/2007  . POLYPECTOMY, HX OF 09/10/2007  . SHINGLES 08/13/2009  . SHOULDER PAIN, LEFT 02/12/2008  . TAH/BSO, HX OF 09/10/2007   Past Surgical History  Procedure Date  . Polypectomy   . Cataract extraction   . Irrigation and debridement sebaceous cyst   . Total abdominal hysterectomy w/ bilateral salpingoophorectomy     reports that she has never smoked. She does not have any smokeless tobacco history on file. Her alcohol and drug histories not on file. family history includes Coronary artery disease in her mother; Heart disease in her mother; and  Hypertension in her mother. No Known Allergies Review of Systems All otherwise neg per pt     Objective:   Physical Exam BP 142/82  Pulse 84  Temp(Src) 99.9 F (37.7 C) (Oral)  Ht 5\' 2"  (1.575 m)  Wt 171 lb 8 oz (77.792 kg)  BMI 31.37 kg/m2  SpO2 95% Physical Exam  VS noted Constitutional: Pt appears well-developed and well-nourished.  HENT: Head: Normocephalic.  Right Ear: External ear normal.  Left Ear: External ear normal.  Eyes: Conjunctivae and EOM are normal. Pupils are equal, round, and reactive to light.  Neck: Normal range of motion. Neck supple.  Cardiovascular: Normal rate and regular rhythm.   Pulmonary/Chest: Effort normal and breath sounds normal.  Neurological: Pt is alert. No cranial nerve deficit.  Skin: Skin is warm.to third finger right hand with diffuse swelling (worse palmar aspect) extending to the palmar MCP area, diffuse mild erythema with small blister lesion nontender nonfluctuant to fingertip;  Nail intact, no paronychia - o/w neurovasc intact;  Has decreased ROM on full flexion of DIP/PIP due to diffuse finger swelling Psychiatric: Pt behavior is normal. Thought content normal.         Assessment & Plan:

## 2011-05-13 NOTE — Patient Instructions (Addendum)
Take all new medications as prescribed Continue all other medications as before Please go to LAB in the Basement for the blood and/or urine tests to be done today Please call the phone number [phone] (the PhoneTree System) for results of testing in 2-3 days;  When calling, simply dial the number, and when prompted enter the MRN number above (the Medical Record Number) and the # key, then the message should start. Your lasix and diovan were refilled to [facility] Please return in 1 wk to Dr Debby Bud please

## 2011-05-13 NOTE — Assessment & Plan Note (Signed)
stable overall by hx and exam, most recent data reviewed with pt, and pt to continue medical treatment as before  BP Readings from Last 3 Encounters:  05/13/11 142/82  05/17/10 130/62  08/24/09 144/62

## 2011-05-15 ENCOUNTER — Encounter: Payer: Self-pay | Admitting: Internal Medicine

## 2011-05-15 ENCOUNTER — Ambulatory Visit (INDEPENDENT_AMBULATORY_CARE_PROVIDER_SITE_OTHER): Payer: Medicare Other | Admitting: Internal Medicine

## 2011-05-15 DIAGNOSIS — M7989 Other specified soft tissue disorders: Secondary | ICD-10-CM

## 2011-05-15 NOTE — Progress Notes (Signed)
  Subjective:    Patient ID: Francisco Capuchin, female    DOB: [birth year], 77 y.o.   MRN: [id number]  HPI Mrs. Folta presents for follow-up injury to middle finger right hand. She does not know how she injured the distal aspect of her finger. She did see Dr. Jonny Ruiz who started doxcycline. She reporets that the redness has dimished. She has no pain or discomfort, no fever.  PMH, FamHx and SocHx reviewed for any changes and relevance.    Review of Systems  Constitutional: Negative for fever, chills and activity change.  HENT: Negative.   Eyes: Negative.   Respiratory: Negative.   Cardiovascular: Negative.   Gastrointestinal: Negative.   Musculoskeletal: Negative.   Neurological: Negative.   Hematological: Negative.        Objective:   Physical Exam Vitals reviewed - afebrile Derm - distal aspect of right index finger with what appears to be a blister with swelling of the tuft of the finger. Residual blood under the nail. No heat, redness or tenderness.  Procedue: aspiration of blister Consent- verbal Prep - betadine followed by alcohol Procedure - using a sterile 23g needle attached to 10cc syringe attempted aspiration. No fluid returned.       Assessment & Plan:

## 2011-05-16 NOTE — Assessment & Plan Note (Signed)
Patient with a swollen distal finger, seen several days ago by Dr. Jonny Ruiz. She was started on doxycyline at that time. She reports that there has been a reduction in redness and minimal decrease in swelling. No fluid was aspirated today.  Plan - complete antibiotics            Soak in warm water with betadine once or twice a day.

## 2011-05-19 ENCOUNTER — Encounter: Payer: Self-pay | Admitting: Internal Medicine

## 2011-05-20 ENCOUNTER — Ambulatory Visit: Payer: BC Managed Care – PPO | Admitting: Internal Medicine

## 2011-06-16 ENCOUNTER — Other Ambulatory Visit: Payer: Self-pay | Admitting: *Deleted

## 2011-06-16 MED ORDER — ATORVASTATIN CALCIUM 20 MG PO TABS: 20.0000 mg | ORAL_TABLET | Freq: Every day | ORAL | Status: DC

## 2011-06-27 ENCOUNTER — Encounter: Payer: Self-pay | Admitting: Internal Medicine

## 2011-08-07 ENCOUNTER — Other Ambulatory Visit (INDEPENDENT_AMBULATORY_CARE_PROVIDER_SITE_OTHER): Payer: Medicare Other

## 2011-08-07 ENCOUNTER — Ambulatory Visit (INDEPENDENT_AMBULATORY_CARE_PROVIDER_SITE_OTHER): Payer: Medicare Other | Admitting: Internal Medicine

## 2011-08-07 VITALS — BP 142/64 | HR 83 | Temp 99.5°F | Wt 172.0 lb

## 2011-08-07 DIAGNOSIS — I10 Essential (primary) hypertension: Secondary | ICD-10-CM

## 2011-08-07 DIAGNOSIS — Z0000 Encounter for general adult medical examination without abnormal findings: Secondary | ICD-10-CM

## 2011-08-07 DIAGNOSIS — E785 Hyperlipidemia, unspecified: Secondary | ICD-10-CM

## 2011-08-07 DIAGNOSIS — D509 Iron deficiency anemia, unspecified: Secondary | ICD-10-CM

## 2011-08-07 DIAGNOSIS — Z136 Encounter for screening for cardiovascular disorders: Secondary | ICD-10-CM

## 2011-08-07 LAB — CBC WITH DIFFERENTIAL/PLATELET
Basophils Absolute: 0 10*3/uL (ref 0.0–0.1)
Basophils Relative: 0.5 % (ref 0.0–3.0)
Eosinophils Absolute: 0.2 10*3/uL (ref 0.0–0.7)
MCHC: 33 g/dL (ref 30.0–36.0)
MCV: 96.6 fl (ref 78.0–100.0)
Monocytes Absolute: 0.4 10*3/uL (ref 0.1–1.0)
Neutrophils Relative %: 57.1 % (ref 43.0–77.0)
Platelets: 227 10*3/uL (ref 150.0–400.0)
RDW: 14.1 % (ref 11.5–14.6)

## 2011-08-07 LAB — LIPID PANEL
Cholesterol: 142 mg/dL (ref 0–200)
Total CHOL/HDL Ratio: 3
Triglycerides: 50 mg/dL (ref 0.0–149.0)

## 2011-08-07 LAB — COMPREHENSIVE METABOLIC PANEL
ALT: 16 U/L (ref 0–35)
AST: 20 U/L (ref 0–37)
Albumin: 4.1 g/dL (ref 3.5–5.2)
Alkaline Phosphatase: 146 U/L — ABNORMAL HIGH (ref 39–117)
Calcium: 9 mg/dL (ref 8.4–10.5)
Chloride: 107 mEq/L (ref 96–112)
Potassium: 3.7 mEq/L (ref 3.5–5.1)
Sodium: 142 mEq/L (ref 135–145)
Total Protein: 7.6 g/dL (ref 6.0–8.3)

## 2011-08-07 LAB — HEPATIC FUNCTION PANEL
ALT: 16 U/L (ref 0–35)
AST: 20 U/L (ref 0–37)
Albumin: 4.1 g/dL (ref 3.5–5.2)
Alkaline Phosphatase: 146 U/L — ABNORMAL HIGH (ref 39–117)

## 2011-08-07 NOTE — Progress Notes (Signed)
Subjective:    Patient ID: Jamie [name], female    DOB: 12-Jun-1933, 77 y.o.   MRN: [id number]  HPI   The patient is here for annual Medicare wellness examination and management of other chronic and acute problems. Awoke this AM with a minor scratchy throat, fullness in the ears and a little balance trouble.    The risk factors are reflected in the social history.  The roster of all physicians providing medical care to patient - is listed in the Snapshot section of the chart.  Activities of daily living:  The patient is 100% inedpendent in all ADLs: dressing, toileting, feeding as well as independent mobility  Home safety : The patient has smoke detectors in the home. They wear seatbelts. No firearms at home. There is no violence in the home. No falls and the home is cleared of fall risks.  There is no risks for hepatitis, STDs or HIV. There is  history of blood transfusion. They have no travel history to infectious disease endemic areas of the world.  The patient has not seen their dentist in the last six month. They have seen their eye doctor in the last year. They deny  any hearing difficulty and have not had audiologic testing in the last year.  They do not  have excessive sun exposure. Discussed the need for sun protection: hats, long sleeves and use of sunscreen if there is significant sun exposure.   Diet: the importance of a healthy diet is discussed. They do have a healthy diet.  The patient does not have a regular exercise program.  The benefits of regular aerobic exercise were discussed.  Depression screen: there are no signs or vegative symptoms of depression- irritability, change in appetite, anhedonia, sadness/tearfullness.  Cognitive assessment: the patient manages all their financial and personal affairs and is actively engaged. They could relate day,date,year and events; recalled 3/3 objects at 3 minutes; performed clock-face test normally.  The following portions  of the patient's history were reviewed and updated as appropriate: allergies, current medications, past family history, past medical history,  past surgical history, past social history  and problem list.  Vision, hearing, body mass index were assessed and reviewed.   During the course of the visit the patient was educated and counseled about appropriate screening and preventive services including : fall prevention , diabetes screening, nutrition counseling, colorectal cancer screening, and recommended immunizations.  Past Medical History  Diagnosis Date  . ANEMIA, IRON DEFICIENCY 09/10/2007  . CATARACT EXTRACTION, HX OF 09/10/2007  . DYSPEPSIA, HX OF 09/10/2007  . HAIR LOSS 03/21/2009  . HAY FEVER 09/10/2007  . HEARING LOSS, SENSORINEURAL 09/10/2007  . HYPERLIPIDEMIA 09/10/2007  . HYPERTENSION 09/10/2007  . LOW BACK PAIN 05/10/2008  . Pain in joint, hand 03/01/2008  . PEPTIC ULCER DISEASE 09/10/2007  . POLYP, COLON 09/10/2007  . POLYPECTOMY, HX OF 09/10/2007  . SHINGLES 08/13/2009  . SHOULDER PAIN, LEFT 02/12/2008  . TAH/BSO, HX OF 09/10/2007   Past Surgical History  Procedure Date  . Polypectomy   . Cataract extraction   . Irrigation and debridement sebaceous cyst   . Total abdominal hysterectomy w/ bilateral salpingoophorectomy    Family History  Problem Relation Age of Onset  . Hypertension Mother   . Coronary artery disease Mother   . Heart disease Mother     CHF  . Cancer Neg Hx     breast or colon  . Diabetes Neg Hx    History   Social History  .  Marital Status: Married    Spouse Name: N/A    Number of Children: 4  . Years of Education: N/A   Occupational History  . [employer], retire 1998 after 30 yrs    Social History Main Topics  . Smoking status: Never Smoker   . Smokeless tobacco: Not on file  . Alcohol Use: Not on file  . Drug Use: Not on file  . Sexually Active: Not on file   Other Topics Concern  . Not on file    Social History Narrative   3 sons, 54,59, 60 and 1 daughter 16, 4 grandchildrenSO with multiple medical problems       Review of Systems Review of Systems  Constitutional:  Negative for fever, chills, activity change and unexpected weight change.  HEENT:  Negative for hearing loss, ear pain, congestion, neck stiffness and postnasal drip. Negative for sore throat or swallowing problems. Negative for dental complaints.   Eyes: Negative for vision loss or change in visual acuity.  Respiratory: Negative for chest tightness and wheezing.   Cardiovascular: Negative for chest pain and palpitation. No decreased exercise tolerance. Rare PVC Gastrointestinal: No change in bowel habit. No bloating or gas. No reflux or indigestion Genitourinary: Negative for urgency, frequency, flank pain and difficulty urinating.  Musculoskeletal: Negative for myalgias, back pain, arthralgias and gait problem. Stiffness of hands in AM, responds to warming Neurological: Negative for dizziness, tremors, weakness and headaches.  Hematological: Negative for adenopathy.  Psychiatric/Behavioral: Negative for behavioral problems and dysphoric mood.       Objective:   Physical Exam Vitals reviewed. Gen'l: well nourished, well developed AA woman in no distress HEENT - [place]/AT, EACs/TMs normal, oropharynx with denture and partial in good condition, no buccal or palatal lesions, posterior pharynx clear, mucous membranes moist. C&S clear, PERRLA, fundi - normal Neck - supple, no thyromegaly Nodes- negative submental, cervical, supraclavicular regions Chest - no deformity, no CVAT Lungs - cleat without rales, wheezes. No increased work of breathing Breast - deferred to mammogram Cardiovascular - regular rate and rhythm, quiet precordium, no murmurs, rubs or gallops, 2+ radial, DP and PT pulses Abdomen - BS+ x 4, no HSM, no guarding or rebound or tenderness Pelvic - deferred to gyn Rectal - deferred to gyn Extremities  - no clubbing, cyanosis, edema or deformity.  Neuro - A&O x 3, CN II-XII normal, motor strength normal and equal, DTRs 2+ and symmetrical biceps, radial, and patellar tendons. Cerebellar - no tremor, no rigidity, fluid movement and normal gait. Derm - Head, neck, back, abdomen and extremities without suspicious lesions  Lab Results  Component Value Date   WBC 5.3 08/07/2011   HGB 12.5 08/07/2011   HCT 37.7 08/07/2011   PLT 227.0 08/07/2011   CHOL 142 08/07/2011   TRIG 50.0 08/07/2011   HDL 52.00 08/07/2011   ALT 16 08/07/2011   ALT 16 08/07/2011   AST 20 08/07/2011   AST 20 08/07/2011   NA 142 08/07/2011   K 3.7 08/07/2011   CL 107 08/07/2011   CREATININE 0.6 08/07/2011   BUN 11 08/07/2011   CO2 30 08/07/2011   TSH 0.90 05/17/2010   Lab Results  Component Value Date   LDLCALC 80 08/07/2011            Assessment & Plan:

## 2011-08-09 DIAGNOSIS — Z0000 Encounter for general adult medical examination without abnormal findings: Secondary | ICD-10-CM | POA: Insufficient documentation

## 2011-08-09 NOTE — Assessment & Plan Note (Signed)
Hemoglobin today is normal range.

## 2011-08-09 NOTE — Assessment & Plan Note (Signed)
BP Readings from Last 3 Encounters:  08/07/11 142/64  05/15/11 152/72  05/13/11 142/82   Adequate control on present medications. No changes at this time

## 2011-08-09 NOTE — Assessment & Plan Note (Signed)
Good control on present medication. No adverse effects from medication.  Plan - continue present regimen

## 2011-08-09 NOTE — Assessment & Plan Note (Signed)
Interval history is benign. Physical exam, sans breast and pelvic, is normal. Lab results are in normal limits. She is current with mammography with last exam Uly 23, '12. She is current with colorectal cancer screening with last exam Nov '09. Immunizations: Pneumonia vaccine Arpil '09; Tetanus June '11. She has had shingles. 12 lead EKG without signs of ischemia or injury.   In summary - a very nice woman who is medically stable at this time. She will continue her healthy diet, a little more exercise would be a good thing. She will return as needed on in 6 months.

## 2011-08-11 ENCOUNTER — Other Ambulatory Visit: Payer: Self-pay | Admitting: *Deleted

## 2011-08-11 ENCOUNTER — Encounter: Payer: Self-pay | Admitting: Internal Medicine

## 2011-08-11 MED ORDER — CLOTRIMAZOLE-BETAMETHASONE 1-0.05 % EX CREA: TOPICAL_CREAM | Freq: Three times a day (TID) | CUTANEOUS | Status: DC

## 2011-10-15 ENCOUNTER — Other Ambulatory Visit: Payer: Self-pay | Admitting: *Deleted

## 2011-10-15 MED ORDER — RANITIDINE HCL 150 MG PO TABS: 150.0000 mg | ORAL_TABLET | Freq: Two times a day (BID) | ORAL | Status: DC

## 2011-12-17 ENCOUNTER — Other Ambulatory Visit: Payer: Self-pay | Admitting: Internal Medicine

## 2012-01-30 ENCOUNTER — Telehealth: Payer: Self-pay | Admitting: *Deleted

## 2012-01-30 NOTE — Telephone Encounter (Signed)
Request for Promethazine HCL 12.5mg ; not listed in patient's EMR [[facility] and/or Epic]. Please advise.

## 2012-01-30 NOTE — Telephone Encounter (Signed)
Ok for phenrgan 12.5 mg 1 po q 6 , # 25, refill x 2. Please update med list.

## 2012-02-02 MED ORDER — PROMETHAZINE HCL 12.5 MG PO TABS: 12.5000 mg | ORAL_TABLET | Freq: Four times a day (QID) | ORAL | Status: DC | PRN

## 2012-02-02 NOTE — Telephone Encounter (Signed)
Done

## 2012-03-26 ENCOUNTER — Other Ambulatory Visit: Payer: Self-pay

## 2012-03-26 MED ORDER — ALPRAZOLAM 0.5 MG PO TABS: 0.5000 mg | ORAL_TABLET | Freq: Three times a day (TID) | ORAL | Status: DC | PRN

## 2012-03-26 NOTE — Telephone Encounter (Signed)
Rx refill request received for Xanax 0.5 mg 1/2 tablet TID PRN

## 2012-03-31 MED ORDER — ALPRAZOLAM 0.5 MG PO TABS: 0.5000 mg | ORAL_TABLET | Freq: Three times a day (TID) | ORAL | Status: DC | PRN

## 2012-03-31 NOTE — Telephone Encounter (Signed)
Unable to locate printed Rx, pharmacy had no record of receiving refill. Rx called in 03/31/2012

## 2012-03-31 NOTE — Telephone Encounter (Signed)
Addended by: Anselm Jungling on: 03/31/2012 12:21 PM   Modules accepted: Orders

## 2012-04-28 ENCOUNTER — Ambulatory Visit (INDEPENDENT_AMBULATORY_CARE_PROVIDER_SITE_OTHER): Payer: Medicare Other | Admitting: Internal Medicine

## 2012-04-28 ENCOUNTER — Ambulatory Visit: Payer: Medicare Other | Admitting: Internal Medicine

## 2012-04-28 ENCOUNTER — Other Ambulatory Visit (INDEPENDENT_AMBULATORY_CARE_PROVIDER_SITE_OTHER): Payer: Medicare Other

## 2012-04-28 ENCOUNTER — Encounter: Payer: Self-pay | Admitting: Internal Medicine

## 2012-04-28 ENCOUNTER — Ambulatory Visit (INDEPENDENT_AMBULATORY_CARE_PROVIDER_SITE_OTHER)
Admission: RE | Admit: 2012-04-28 | Discharge: 2012-04-28 | Disposition: A | Discharge status: Home / Self Care | Payer: Medicare Other | Source: Ambulatory Visit | Attending: Internal Medicine | Admitting: Internal Medicine

## 2012-04-28 VITALS — BP 138/72 | HR 80 | Temp 98.8°F | Resp 16 | Wt 164.0 lb

## 2012-04-28 DIAGNOSIS — R1033 Periumbilical pain: Secondary | ICD-10-CM

## 2012-04-28 DIAGNOSIS — N390 Urinary tract infection, site not specified: Secondary | ICD-10-CM

## 2012-04-28 DIAGNOSIS — I10 Essential (primary) hypertension: Secondary | ICD-10-CM

## 2012-04-28 LAB — URINALYSIS, ROUTINE W REFLEX MICROSCOPIC
Bilirubin Urine: NEGATIVE
Ketones, ur: NEGATIVE
Nitrite: NEGATIVE
Specific Gravity, Urine: 1.02 (ref 1.000–1.030)
Urine Glucose: NEGATIVE
Urobilinogen, UA: 1 (ref 0.0–1.0)
pH: 7 (ref 5.0–8.0)

## 2012-04-28 LAB — CBC WITH DIFFERENTIAL/PLATELET
Basophils Relative: 0.3 % (ref 0.0–3.0)
Eosinophils Absolute: 0.1 10*3/uL (ref 0.0–0.7)
Eosinophils Relative: 0.8 % (ref 0.0–5.0)
Lymphocytes Relative: 19.1 % (ref 12.0–46.0)
MCHC: 33 g/dL (ref 30.0–36.0)
MCV: 95.5 fl (ref 78.0–100.0)
Monocytes Absolute: 0.6 10*3/uL (ref 0.1–1.0)
Neutrophils Relative %: 73.3 % (ref 43.0–77.0)
Platelets: 237 10*3/uL (ref 150.0–400.0)
RBC: 3.93 Mil/uL (ref 3.87–5.11)
WBC: 8.7 10*3/uL (ref 4.5–10.5)

## 2012-04-28 LAB — LIPASE: Lipase: 34 U/L (ref 11.0–59.0)

## 2012-04-28 LAB — COMPREHENSIVE METABOLIC PANEL
Albumin: 3.8 g/dL (ref 3.5–5.2)
BUN: 8 mg/dL (ref 6–23)
Calcium: 9.1 mg/dL (ref 8.4–10.5)
Chloride: 103 mEq/L (ref 96–112)
Glucose, Bld: 95 mg/dL (ref 70–99)
Potassium: 3.8 mEq/L (ref 3.5–5.1)
Sodium: 141 mEq/L (ref 135–145)
Total Protein: 7.7 g/dL (ref 6.0–8.3)

## 2012-04-28 NOTE — Progress Notes (Signed)
Subjective:    Patient ID: Jamie [name], female    DOB: Aug 05, 1933, 78 y.o.   MRN: [id number]  Abdominal Pain This is a new problem. The current episode started in the past 7 days. The onset quality is gradual. The problem occurs intermittently. The most recent episode lasted 5 days. The problem has been unchanged. The pain is located in the suprapubic region. The pain is at a severity of 2/10. The pain is mild. Quality: squeezing. The abdominal pain does not radiate. Pertinent negatives include no anorexia, arthralgias, belching, constipation, diarrhea, dysuria, fever, flatus, frequency, headaches, hematochezia, hematuria, melena, myalgias, nausea, vomiting or weight loss. The pain is aggravated by nothing. The pain is relieved by nothing. She has tried nothing for the symptoms.      Review of Systems  Constitutional: Negative for fever, chills, weight loss, diaphoresis, activity change, appetite change, fatigue and unexpected weight change.  HENT: Negative.   Eyes: Negative.   Respiratory: Negative for cough, chest tightness, shortness of breath, wheezing and stridor.   Cardiovascular: Negative for chest pain, palpitations and leg swelling.  Gastrointestinal: Positive for abdominal pain. Negative for nausea, vomiting, diarrhea, constipation, blood in stool, melena, hematochezia, abdominal distention, anal bleeding, rectal pain, anorexia and flatus.  Genitourinary: Negative for dysuria, urgency, frequency, hematuria, flank pain, decreased urine volume, vaginal bleeding, vaginal discharge, enuresis, difficulty urinating, genital sores, vaginal pain, pelvic pain and dyspareunia.  Musculoskeletal: Negative for myalgias, back pain, joint swelling, arthralgias and gait problem.  Skin: Negative for color change, pallor, rash and wound.  Neurological: Negative.  Negative for headaches.  Hematological: Negative for adenopathy. Does not bruise/bleed easily.  Psychiatric/Behavioral: Negative.         Objective:   Physical Exam  Vitals reviewed. Constitutional: She is oriented to person, place, and time. She appears well-developed and well-nourished. No distress.  HENT:  Head: Normocephalic and atraumatic.  Mouth/Throat: Oropharynx is clear and moist. No oropharyngeal exudate.  Eyes: Conjunctivae are normal. Right eye exhibits no discharge. Left eye exhibits no discharge. No scleral icterus.  Neck: Normal range of motion. Neck supple. No JVD present. No tracheal deviation present. No thyromegaly present.  Cardiovascular: Normal rate, regular rhythm, normal heart sounds and intact distal pulses.  Exam reveals no gallop and no friction rub.   No murmur heard. Pulmonary/Chest: Effort normal and breath sounds normal. No stridor. No respiratory distress. She has no wheezes. She has no rales. She exhibits no tenderness.  Abdominal: Soft. Normal appearance. She exhibits no shifting dullness, no distension, no pulsatile liver, no fluid wave, no abdominal bruit, no ascites, no pulsatile midline mass and no mass. Bowel sounds are decreased. There is no hepatosplenomegaly, splenomegaly or hepatomegaly. There is tenderness in the suprapubic area. There is no rigidity, no rebound, no guarding, no CVA tenderness, no tenderness at McBurney's point and negative Murphy's sign. No hernia. Hernia confirmed negative in the ventral area, confirmed negative in the right inguinal area and confirmed negative in the left inguinal area.  Genitourinary: Rectum normal. Rectal exam shows no external hemorrhoid, no internal hemorrhoid, no fissure, no mass, no tenderness and anal tone normal. Guaiac negative stool.  Musculoskeletal: Normal range of motion. She exhibits no edema and no tenderness.  Lymphadenopathy:    She has no cervical adenopathy.  Neurological: She is oriented to person, place, and time.  Skin: Skin is warm and dry. No rash noted. She is not diaphoretic. No erythema. No pallor.  Psychiatric: She  has a normal mood and affect. Her behavior  is normal. Judgment and thought content normal.     Lab Results  Component Value Date   WBC 8.7 04/28/2012   HGB 12.4 04/28/2012   HCT 37.5 04/28/2012   PLT 237.0 04/28/2012   GLUCOSE 95 04/28/2012   CHOL 142 08/07/2011   TRIG 50.0 08/07/2011   HDL 52.00 08/07/2011   LDLCALC 80 08/07/2011   ALT 14 04/28/2012   AST 16 04/28/2012   NA 141 04/28/2012   K 3.8 04/28/2012   CL 103 04/28/2012   CREATININE 0.8 04/28/2012   BUN 8 04/28/2012   CO2 30 04/28/2012   TSH 1.12 04/28/2012       Assessment & Plan:

## 2012-04-28 NOTE — Patient Instructions (Signed)

## 2012-04-29 ENCOUNTER — Encounter: Payer: Self-pay | Admitting: Internal Medicine

## 2012-04-29 DIAGNOSIS — N390 Urinary tract infection, site not specified: Secondary | ICD-10-CM | POA: Insufficient documentation

## 2012-04-29 MED ORDER — CIPROFLOXACIN HCL 250 MG PO TABS: 250.0000 mg | ORAL_TABLET | Freq: Two times a day (BID) | ORAL | Status: AC

## 2012-04-29 NOTE — Assessment & Plan Note (Signed)
Her BP is well controlled, I will check her lytes and renal function 

## 2012-04-29 NOTE — Assessment & Plan Note (Signed)
Her UA looks suspicious for infection so I have asked her to start cipro

## 2012-04-29 NOTE — Assessment & Plan Note (Signed)
Her pain and exam do not indicate anything acute, I will check her labs and xray today to look for pathology

## 2012-05-06 ENCOUNTER — Ambulatory Visit (INDEPENDENT_AMBULATORY_CARE_PROVIDER_SITE_OTHER)
Admission: RE | Admit: 2012-05-06 | Discharge: 2012-05-06 | Disposition: A | Discharge status: Home / Self Care | Payer: Medicare Other | Source: Ambulatory Visit | Attending: Internal Medicine | Admitting: Internal Medicine

## 2012-05-06 ENCOUNTER — Telehealth: Payer: Self-pay | Admitting: Internal Medicine

## 2012-05-06 ENCOUNTER — Encounter: Payer: Self-pay | Admitting: Internal Medicine

## 2012-05-06 ENCOUNTER — Ambulatory Visit (INDEPENDENT_AMBULATORY_CARE_PROVIDER_SITE_OTHER): Payer: Medicare Other | Admitting: Internal Medicine

## 2012-05-06 VITALS — BP 132/64 | HR 60 | Temp 98.7°F | Resp 16 | Wt 161.0 lb

## 2012-05-06 DIAGNOSIS — J301 Allergic rhinitis due to pollen: Secondary | ICD-10-CM

## 2012-05-06 DIAGNOSIS — R1033 Periumbilical pain: Secondary | ICD-10-CM

## 2012-05-06 DIAGNOSIS — R52 Pain, unspecified: Secondary | ICD-10-CM

## 2012-05-06 MED ORDER — PROMETHAZINE-CODEINE 6.25-10 MG/5ML PO SYRP: 5.0000 mL | ORAL_SOLUTION | ORAL | Status: AC | PRN

## 2012-05-06 NOTE — Patient Instructions (Signed)
Blood pressure is good.  No signs of a bacterial infection by history or exam. This appears to be a viral infection vs allergy.  Plan - for the cough Robitussin DM, or the equivalent, during the day. Take loratadine (OTC) for the drip. At bedtime take phenergan with codiene syrup so the cough doesn't keep you up. Lots of fluids and mucinex 1200 mg twice a day. Vitamin C.  Mild stomach discomfort - may be related to all the post-nasal drainage. Plan - take the Ranitidine twice a day. If needed for discomfort and/or gas take a tablespoon of Mylanta II or Riopan Plus.

## 2012-05-06 NOTE — Progress Notes (Signed)
  Subjective:    Patient ID: Jamie [name], female    DOB: [birth year], 78 y.o.   MRN: [id number]  HPI Jamie [name] presents for a 3 week h/o sinus drainage, fullness, cough productive phlegm that is clear to yellow w/o metallic or bitter taste. She has not had any fever or SOB. She odes a bit of sleep disturbance due to the cough.   She reports that she has mild dyspepsia with a trace of nausea but no vomiting. She is taking Ranitidine twice a day, most days.   PMH, FamHx and SocHx reviewed for any changes and relevance.    Review of Systems System review is negative for any constitutional, cardiac, pulmonary, GI or neuro symptoms or complaints other than as described in the HPI.     Objective:   Physical Exam Filed Vitals:   05/06/12 1206  BP: 132/64  Pulse: 60  Temp: 98.7 F (37.1 C)  Resp: 16   Gen'l- WNWD AA woman in no acute distress HEENT - no tenderness to percussion over frontal or max sinus, Throat clear Nodes - none Cor- RRR Pulm - CTAP Abd - BS+, minimal epigastric tenderness.      Assessment & Plan:

## 2012-05-06 NOTE — Telephone Encounter (Signed)
The pt called and is hoping to be worked in for cold symptoms.  She refused an apt with a different dr or a different day.   (Your schedule already has 4 double books for this afternoon)   Thanks!    [phone]

## 2012-05-07 ENCOUNTER — Telehealth: Payer: Self-pay | Admitting: *Deleted

## 2012-05-07 NOTE — Telephone Encounter (Signed)
Message copied by Elnora Morrison on Fri May 07, 2012  2:49 PM ------      Message from: Jacques Navy      Created: Fri May 07, 2012  2:31 PM       Call patient - follow-up x-ray 100% normal

## 2012-05-07 NOTE — Telephone Encounter (Signed)
PATIENT NOTIFIED OF X-RAY RESULT OF ABDOMEN NORMAL

## 2012-05-09 NOTE — Assessment & Plan Note (Signed)
Symptoms today w/o evidence of a bacterial infection - suspect allergy with possible viral illness  Plan  Phenergan/codeine cough syrup  Supportive care.

## 2012-05-09 NOTE — Assessment & Plan Note (Signed)
Patient was evaluated for abdominal pain - KUB with mild dilation of colon. Her symptoms have resolved. She is very worried about the abnormal KUG  Plan - follow-up KUB  Addendum: KUB June 6th: IMPRESSION:  Normal examination. No plain radiographic findings suspicious for  a colon mass.

## 2012-05-12 ENCOUNTER — Ambulatory Visit: Payer: Medicare Other | Admitting: Internal Medicine

## 2012-05-17 ENCOUNTER — Other Ambulatory Visit: Payer: Self-pay | Admitting: Internal Medicine

## 2012-05-18 NOTE — Telephone Encounter (Signed)
Med refills sent to [facility].

## 2012-06-18 NOTE — Telephone Encounter (Signed)
error 

## 2012-08-13 ENCOUNTER — Encounter: Payer: Self-pay | Admitting: Internal Medicine

## 2012-08-26 ENCOUNTER — Other Ambulatory Visit: Payer: Self-pay | Admitting: *Deleted

## 2012-08-26 MED ORDER — CLOTRIMAZOLE-BETAMETHASONE 1-0.05 % EX CREA: TOPICAL_CREAM | Freq: Three times a day (TID) | CUTANEOUS | Status: DC

## 2012-09-21 ENCOUNTER — Ambulatory Visit (INDEPENDENT_AMBULATORY_CARE_PROVIDER_SITE_OTHER): Payer: Medicare Other | Admitting: Internal Medicine

## 2012-09-21 ENCOUNTER — Encounter: Payer: Self-pay | Admitting: Internal Medicine

## 2012-09-21 ENCOUNTER — Other Ambulatory Visit (INDEPENDENT_AMBULATORY_CARE_PROVIDER_SITE_OTHER): Payer: Medicare Other

## 2012-09-21 VITALS — BP 130/62 | HR 76 | Temp 98.5°F | Resp 16 | Ht 62.0 in | Wt 164.0 lb

## 2012-09-21 DIAGNOSIS — Z0000 Encounter for general adult medical examination without abnormal findings: Secondary | ICD-10-CM

## 2012-09-21 DIAGNOSIS — M25549 Pain in joints of unspecified hand: Secondary | ICD-10-CM

## 2012-09-21 DIAGNOSIS — I10 Essential (primary) hypertension: Secondary | ICD-10-CM

## 2012-09-21 DIAGNOSIS — K279 Peptic ulcer, site unspecified, unspecified as acute or chronic, without hemorrhage or perforation: Secondary | ICD-10-CM

## 2012-09-21 DIAGNOSIS — D509 Iron deficiency anemia, unspecified: Secondary | ICD-10-CM

## 2012-09-21 DIAGNOSIS — Z23 Encounter for immunization: Secondary | ICD-10-CM | POA: Insufficient documentation

## 2012-09-21 DIAGNOSIS — E785 Hyperlipidemia, unspecified: Secondary | ICD-10-CM

## 2012-09-21 LAB — CBC WITH DIFFERENTIAL/PLATELET
Eosinophils Relative: 3.3 % (ref 0.0–5.0)
HCT: 39.5 % (ref 36.0–46.0)
Lymphs Abs: 1.5 10*3/uL (ref 0.7–4.0)
Monocytes Relative: 7 % (ref 3.0–12.0)
Neutrophils Relative %: 58.6 % (ref 43.0–77.0)
Platelets: 208 10*3/uL (ref 150.0–400.0)
RBC: 4.04 Mil/uL (ref 3.87–5.11)
WBC: 5.1 10*3/uL (ref 4.5–10.5)

## 2012-09-21 NOTE — Patient Instructions (Addendum)
Thanks for coming to see me. Your exam is normal.  Please: 1. Try to exercise at least 3 times a week: walking is good 2. Try to loose a little weight.  A full report will follow including your lab work.

## 2012-09-21 NOTE — Progress Notes (Signed)
Subjective:    Patient ID: Jamie [name], female    DOB: Apr 02, 1933, 78 y.o.   MRN: [id number]  HPI The patient is here for annual Medicare wellness examination and management of other chronic and acute problems.   The risk factors are reflected in the social history.  The roster of all physicians providing medical care to patient - is listed in the Snapshot section of the chart.  Activities of daily living:  The patient is 100% inedpendent in all ADLs: dressing, toileting, feeding as well as independent mobility  Home safety : The patient has smoke detectors in the home. Fall - home is fall safe. They wear seatbelts. No firearms at home. There is no violence in the home.   There is no risks for hepatitis, STDs or HIV. There is no   history of blood transfusion. They have no travel history to infectious disease endemic areas of the world.  The patient has not seen their dentist in the last six month. They have not seen their eye doctor in the last year. They deny any hearing difficulty and have not had audiologic testing in the last year.    They do not  have excessive sun exposure. Discussed the need for sun protection: hats, long sleeves and use of sunscreen if there is significant sun exposure.   Diet: the importance of a healthy diet is discussed. They do have a healthy  diet.  The patient has no regular exercise program.  The benefits of regular aerobic exercise were discussed.  Depression screen: there are no signs or vegative symptoms of depression- irritability, change in appetite, anhedonia, sadness/tearfullness.  Cognitive assessment: the patient manages all their financial and personal affairs and is actively engaged.   Vision, hearing, body mass index were assessed and reviewed.   During the course of the visit the patient was educated and counseled about appropriate screening and preventive services including : fall prevention , diabetes screening, nutrition  counseling, colorectal cancer screening, and recommended immunizations.  Past Medical History  Diagnosis Date  . ANEMIA, IRON DEFICIENCY 09/10/2007  . CATARACT EXTRACTION, HX OF 09/10/2007  . DYSPEPSIA, HX OF 09/10/2007  . HAIR LOSS 03/21/2009  . HAY FEVER 09/10/2007  . HEARING LOSS, SENSORINEURAL 09/10/2007  . HYPERLIPIDEMIA 09/10/2007  . HYPERTENSION 09/10/2007  . LOW BACK PAIN 05/10/2008  . Pain in joint, hand 03/01/2008  . PEPTIC ULCER DISEASE 09/10/2007  . POLYP, COLON 09/10/2007  . POLYPECTOMY, HX OF 09/10/2007  . SHINGLES 08/13/2009  . SHOULDER PAIN, LEFT 02/12/2008  . TAH/BSO, HX OF 09/10/2007   Past Surgical History  Procedure Date  . Polypectomy   . Cataract extraction   . Irrigation and debridement sebaceous cyst   . Total abdominal hysterectomy w/ bilateral salpingoophorectomy    Family History  Problem Relation Age of Onset  . Hypertension Mother   . Coronary artery disease Mother   . Heart disease Mother     CHF  . Cancer Neg Hx     breast or colon  . Diabetes Neg Hx    History   Social History  . Marital Status: Married    Spouse Name: N/A    Number of Children: 4  . Years of Education: N/A   Occupational History  . [employer], retire 1998 after 30 yrs    Social History Main Topics  . Smoking status: Never Smoker   . Smokeless tobacco: Not on file  . Alcohol Use: Not on file  .  Drug Use: Not on file  . Sexually Active: Not on file   Other Topics Concern  . Not on file   Social History Narrative   3 sons, 54,59, 41 and 1 daughter 16, 4 grandchildrenSO with multiple medical problems    Current Outpatient Prescriptions on File Prior to Visit  Medication Sig Dispense Refill  . acetaminophen (TYLENOL) 500 MG tablet Take 500 mg by mouth as needed.        . ALPRAZolam (XANAX) 0.5 MG tablet Take 1 tablet (0.5 mg total) by mouth 3 (three) times daily as needed for sleep or anxiety.  30 tablet  2  . alum & mag  hydroxide-simeth (MYLANTA) 200-200-20 MG/5ML suspension Take by mouth as needed.        Marland Kitchen atorvastatin (LIPITOR) 20 MG tablet TAKE ONE (1) TABLET EACH DAY  90 tablet  3  . clotrimazole-betamethasone (LOTRISONE) cream Apply topically 3 (three) times daily.  30 g  3  . DIOVAN 320 MG tablet TAKE ONE (1) TABLET EACH DAY  90 tablet  3  . furosemide (LASIX) 40 MG tablet TAKE ONE (1) TABLET EACH DAY                                                  Generic for LASIX 40  90 tablet  3  . meclizine (ANTIVERT) 25 MG tablet Take 25 mg by mouth every 6 (six) hours as needed.        . methylcellulose (CITRUCEL) oral powder Take by mouth daily.        . ranitidine (ZANTAC) 150 MG tablet Take 1 tablet (150 mg total) by mouth 2 (two) times daily.  180 tablet  3  . promethazine (PHENERGAN) 12.5 MG tablet Take 1 tablet (12.5 mg total) by mouth every 6 (six) hours as needed for nausea.  25 tablet  2       Review of Systems Constitutional:  Negative for fever, chills, activity change and unexpected weight change.  HEENT:  Negative for hearing loss, ear pain, congestion, neck stiffness and postnasal drip. Negative for sore throat or swallowing problems. Negative for dental complaints.   Eyes: Negative for vision loss or change in visual acuity.  Respiratory: Negative for chest tightness and wheezing. Negative for DOE.   Cardiovascular: Negative for chest pain or palpitations. No decreased exercise tolerance Gastrointestinal: No change in bowel habit. No bloating or gas. No reflux or indigestion Genitourinary: Negative for urgency, frequency, flank pain and difficulty urinating.  Musculoskeletal: Negative for myalgias, back pain, arthralgias and gait problem.  Neurological: Negative for dizziness, tremors, weakness and headaches.  Hematological: Negative for adenopathy.  Psychiatric/Behavioral: Negative for behavioral problems and dysphoric mood.       Objective:   Physical Exam Filed Vitals:   09/21/12 1454   BP: 130/62  Pulse: 76  Temp: 98.5 F (36.9 C)  Resp: 16   Wt Readings from Last 3 Encounters:  09/21/12 164 lb (74.39 kg)  05/06/12 161 lb (73.029 kg)  04/28/12 164 lb (74.39 kg)   Gen'l: well nourished, well developed AA Woman in no distress HEENT - [place]/AT, EACs/TMs normal, oropharynx with full upper denture, lower partial, no buccal lesions, posterior pharynx clear, mucous membranes moist. C&S clear, PERRLA, arcus senilis noted. Neck - supple, no thyromegaly Nodes- negative submental, cervical, supraclavicular regions Chest - no deformity, no  CVAT Lungs - clear without rales, wheezes. No increased work of breathing Breast - deferred to mammography Cardiovascular - regular rate and rhythm, quiet precordium, no murmurs, rubs or gallops, 2+ radial, DP and PT pulses Abdomen - BS+ x 4, no HSM, no guarding or rebound or tenderness Pelvic - deferred to age Rectal - deferred  Extremities - no clubbing, cyanosis, edema or deformity.  Neuro - A&O x 3, CN II-XII normal, motor strength normal and equal, DTRs 2+ and symmetrical biceps, radial, and patellar tendons. Cerebellar - no tremor, no rigidity, fluid movement and normal gait. Derm - Head, neck, back, abdomen and extremities without suspicious lesions  Lab Results  Component Value Date   WBC 8.7 04/28/2012   HGB 12.4 04/28/2012   HCT 37.5 04/28/2012   PLT 237.0 04/28/2012   GLUCOSE 95 04/28/2012   CHOL 142 08/07/2011   TRIG 50.0 08/07/2011   HDL 52.00 08/07/2011   LDLCALC 80 08/07/2011   ALT 14 04/28/2012   AST 16 04/28/2012   NA 141 04/28/2012   K 3.8 04/28/2012   CL 103 04/28/2012   CREATININE 0.8 04/28/2012   BUN 8 04/28/2012   CO2 30 04/28/2012   TSH 1.12 04/28/2012   New labs ordered and pending         Assessment & Plan:

## 2012-09-22 LAB — COMPREHENSIVE METABOLIC PANEL
AST: 18 U/L (ref 0–37)
Alkaline Phosphatase: 150 U/L — ABNORMAL HIGH (ref 39–117)
BUN: 11 mg/dL (ref 6–23)
Creatinine, Ser: 0.8 mg/dL (ref 0.4–1.2)

## 2012-09-22 LAB — LIPID PANEL
Cholesterol: 144 mg/dL (ref 0–200)
LDL Cholesterol: 83 mg/dL (ref 0–99)
Triglycerides: 75 mg/dL (ref 0.0–149.0)
VLDL: 15 mg/dL (ref 0.0–40.0)

## 2012-09-22 LAB — HEPATIC FUNCTION PANEL
ALT: 16 U/L (ref 0–35)
Alkaline Phosphatase: 150 U/L — ABNORMAL HIGH (ref 39–117)
Bilirubin, Direct: 0.1 mg/dL (ref 0.0–0.3)
Total Protein: 7.4 g/dL (ref 6.0–8.3)

## 2012-09-22 NOTE — Assessment & Plan Note (Signed)
Lab Results  Component Value Date   WBC 8.7 04/28/2012   HGB 12.4 04/28/2012   HCT 37.5 04/28/2012   MCV 95.5 04/28/2012   PLT 237.0 04/28/2012   Normal Hgb

## 2012-09-22 NOTE — Assessment & Plan Note (Signed)
Interval history is unremarkable. Limited physical exam is normal. She is current with colorectal and breast cancer screening. Immunizations are up to date.  In summary - a very nice woman who is medically stable and doing well. She will return in 1 year, sooner as needed.

## 2012-09-22 NOTE — Assessment & Plan Note (Signed)
BP Readings from Last 3 Encounters:  09/21/12 130/62  05/06/12 132/64  04/28/12 138/72   Good control on present medication

## 2012-09-22 NOTE — Assessment & Plan Note (Signed)
Tolerates medication well. Has previously been well controlled.  Plan  Follow-up lab with recommendations to follow.

## 2012-09-22 NOTE — Assessment & Plan Note (Signed)
Hand and wrist pain, worse in the AM. Mildly positive Tinel's c/w mild carpal tunnel syndrome.  Plan Provided with cock-up wrist splints to use at night.

## 2012-09-22 NOTE — Assessment & Plan Note (Signed)
Stable with no c/o abdominal pain.   Plan - continue on H2 blocker therapy - ranitidine

## 2012-09-23 ENCOUNTER — Encounter: Payer: Self-pay | Admitting: Internal Medicine

## 2012-10-25 ENCOUNTER — Other Ambulatory Visit: Payer: Self-pay | Admitting: *Deleted

## 2012-10-25 MED ORDER — ALPRAZOLAM 0.5 MG PO TABS: 0.5000 mg | ORAL_TABLET | Freq: Three times a day (TID) | ORAL | Status: DC | PRN

## 2012-11-12 ENCOUNTER — Other Ambulatory Visit: Payer: Self-pay | Admitting: *Deleted

## 2012-11-13 MED ORDER — ALPRAZOLAM 0.5 MG PO TABS: 0.5000 mg | ORAL_TABLET | Freq: Three times a day (TID) | ORAL | Status: DC | PRN

## 2012-12-29 ENCOUNTER — Other Ambulatory Visit: Payer: Self-pay | Admitting: *Deleted

## 2012-12-29 MED ORDER — ATORVASTATIN CALCIUM 20 MG PO TABS: 20.0000 mg | ORAL_TABLET | Freq: Every day | ORAL | Status: DC

## 2013-01-10 ENCOUNTER — Other Ambulatory Visit: Payer: Self-pay | Admitting: *Deleted

## 2013-01-10 MED ORDER — ATORVASTATIN CALCIUM 20 MG PO TABS: 20.0000 mg | ORAL_TABLET | Freq: Every day | ORAL | Status: DC

## 2013-01-12 ENCOUNTER — Telehealth: Payer: Self-pay | Admitting: Internal Medicine

## 2013-01-12 NOTE — Telephone Encounter (Signed)
Patient Information:  Caller Name: Jamie [name]  Phone: [phone]  Patient: Jamie, [name]  Gender: Female  DOB: 23-Feb-1933  Age: 79 Years  PCP: Jamie [name] (Adults only)  Office Follow Up:  Does the office need to follow up with this patient?: No  Instructions For The Office: N/A   Symptoms  Reason For Call & Symptoms: Patient asks why there is comment on her cholesterol medication that she needs a physical exam.  Reviewed EMR and adivsed that it was on Rx in chart.  She voiced understanding and states she will call later to schedule the appointment.  Reviewed Health History In EMR: Yes  Reviewed Medications In EMR: Yes  Reviewed Allergies In EMR: Yes  Reviewed Surgeries / Procedures: Yes  Date of Onset of Symptoms: Unknown  Guideline(s) Used:  No Protocol Available - Information Only  Disposition Per Guideline:   Home Care  Reason For Disposition Reached:   Information only question and nurse able to answer  Advice Given:  Call Back If:  New symptoms develop  You become worse.

## 2013-01-19 ENCOUNTER — Ambulatory Visit (INDEPENDENT_AMBULATORY_CARE_PROVIDER_SITE_OTHER): Payer: Medicare Other | Admitting: Internal Medicine

## 2013-01-19 ENCOUNTER — Encounter: Payer: Self-pay | Admitting: Internal Medicine

## 2013-01-19 VITALS — BP 130/64 | HR 98 | Temp 98.3°F | Resp 10 | Wt 165.0 lb

## 2013-01-19 DIAGNOSIS — E785 Hyperlipidemia, unspecified: Secondary | ICD-10-CM

## 2013-01-19 DIAGNOSIS — I10 Essential (primary) hypertension: Secondary | ICD-10-CM

## 2013-01-19 MED ORDER — ATORVASTATIN CALCIUM 20 MG PO TABS: 20.0000 mg | ORAL_TABLET | Freq: Every day | ORAL | Status: DC

## 2013-01-19 NOTE — Progress Notes (Signed)
  Subjective:    Patient ID: Francisco Capuchin, female    DOB: 1933-11-13, 79 y.o.   MRN: [id number]  HPI Mrs. Durrell presents because on her refill prescription there was an uncleared message that she was due for physical. She had full exam in October '13.  She is feeling well and doing well.  PMH, FamHx and SocHx reviewed for any changes and relevance.  Medications - reviewed   Review of Systems Constitutional:  Negative for fever, chills, activity change and unexpected weight change.  HEENT:  Negative for hearing loss, ear pain, congestion, neck stiffness and postnasal drip. Negative for sore throat or swallowing problems. Negative for dental complaints.   Eyes: Negative for vision loss or change in visual acuity.  Respiratory: Negative for chest tightness and wheezing. Negative for DOE.   Cardiovascular: Negative for chest pain or palpitations. No decreased exercise tolerance Gastrointestinal: No change in bowel habit. No bloating or gas. No reflux or indigestion Genitourinary: Negative for urgency, frequency, flank pain and difficulty urinating.  Musculoskeletal: Negative for myalgias, back pain, arthralgias and gait problem.  Neurological: Negative for dizziness, tremors, weakness and headaches.  Hematological: Negative for adenopathy.  Psychiatric/Behavioral: Negative for behavioral problems and dysphoric mood.        Objective:   Physical Exam Filed Vitals:   01/19/13 1034  BP: 130/64  Pulse: 98  Temp: 98.3 F (36.8 C)  Resp: 10   Wt Readings from Last 3 Encounters:  01/19/13 165 lb (74.844 kg)  09/21/12 164 lb (74.39 kg)  05/06/12 161 lb (73.029 kg)   Gen'l - WNWD AA woman Cor- RRR Pulm - CTAP Neuro - normal       Assessment & Plan:

## 2013-01-19 NOTE — Assessment & Plan Note (Signed)
Last Lipid panel - good control.  No change in medication.

## 2013-01-19 NOTE — Assessment & Plan Note (Signed)
BP Readings from Last 3 Encounters:  01/19/13 130/64  09/21/12 130/62  05/06/12 132/64   Continue present medication

## 2013-03-16 ENCOUNTER — Other Ambulatory Visit: Payer: Self-pay | Admitting: Internal Medicine

## 2013-04-11 ENCOUNTER — Other Ambulatory Visit: Payer: Self-pay | Admitting: Internal Medicine

## 2013-04-11 NOTE — Telephone Encounter (Signed)
Alprazolam called to pharmacy #[id number], no refills

## 2013-05-08 ENCOUNTER — Other Ambulatory Visit: Payer: Self-pay | Admitting: Internal Medicine

## 2013-05-09 ENCOUNTER — Other Ambulatory Visit: Payer: Self-pay | Admitting: Internal Medicine

## 2013-08-25 ENCOUNTER — Other Ambulatory Visit: Payer: Self-pay | Admitting: Internal Medicine

## 2013-09-03 ENCOUNTER — Other Ambulatory Visit: Payer: Self-pay | Admitting: Internal Medicine

## 2013-09-20 ENCOUNTER — Other Ambulatory Visit: Payer: Self-pay | Admitting: Internal Medicine

## 2013-09-22 ENCOUNTER — Ambulatory Visit (INDEPENDENT_AMBULATORY_CARE_PROVIDER_SITE_OTHER): Payer: Medicare PPO | Admitting: Internal Medicine

## 2013-09-22 ENCOUNTER — Other Ambulatory Visit (INDEPENDENT_AMBULATORY_CARE_PROVIDER_SITE_OTHER): Payer: Medicare PPO

## 2013-09-22 ENCOUNTER — Encounter: Payer: Self-pay | Admitting: Internal Medicine

## 2013-09-22 VITALS — BP 146/60 | HR 75 | Temp 98.1°F | Ht 62.0 in | Wt 161.4 lb

## 2013-09-22 DIAGNOSIS — I10 Essential (primary) hypertension: Secondary | ICD-10-CM

## 2013-09-22 DIAGNOSIS — K279 Peptic ulcer, site unspecified, unspecified as acute or chronic, without hemorrhage or perforation: Secondary | ICD-10-CM

## 2013-09-22 DIAGNOSIS — Z0000 Encounter for general adult medical examination without abnormal findings: Secondary | ICD-10-CM

## 2013-09-22 DIAGNOSIS — E785 Hyperlipidemia, unspecified: Secondary | ICD-10-CM

## 2013-09-22 DIAGNOSIS — M21612 Bunion of left foot: Secondary | ICD-10-CM

## 2013-09-22 DIAGNOSIS — D509 Iron deficiency anemia, unspecified: Secondary | ICD-10-CM

## 2013-09-22 DIAGNOSIS — Z23 Encounter for immunization: Secondary | ICD-10-CM

## 2013-09-22 LAB — COMPREHENSIVE METABOLIC PANEL
ALT: 14 U/L (ref 0–35)
AST: 20 U/L (ref 0–37)
BUN: 11 mg/dL (ref 6–23)
CO2: 29 mEq/L (ref 19–32)
Calcium: 9.3 mg/dL (ref 8.4–10.5)
Creatinine, Ser: 0.8 mg/dL (ref 0.4–1.2)
GFR: 87.81 mL/min (ref >60.00)
Sodium: 141 mEq/L (ref 135–145)
Total Bilirubin: 0.6 mg/dL (ref 0.3–1.2)

## 2013-09-22 LAB — IBC PANEL
Iron: 96 ug/dL (ref 42–145)
Saturation Ratios: 24.2 % (ref 20.0–50.0)

## 2013-09-22 LAB — LIPID PANEL
Cholesterol: 143 mg/dL (ref 0–200)
HDL: 49.3 mg/dL (ref >39.00)
Total CHOL/HDL Ratio: 3
Triglycerides: 38 mg/dL (ref 0.0–149.0)
VLDL: 7.6 mg/dL (ref 0.0–40.0)

## 2013-09-22 LAB — HEPATIC FUNCTION PANEL
AST: 20 U/L (ref 0–37)
Albumin: 4.2 g/dL (ref 3.5–5.2)
Alkaline Phosphatase: 152 U/L — ABNORMAL HIGH (ref 39–117)
Bilirubin, Direct: 0.1 mg/dL (ref 0.0–0.3)
Total Bilirubin: 0.6 mg/dL (ref 0.3–1.2)

## 2013-09-22 LAB — HEMOGLOBIN AND HEMATOCRIT, BLOOD: Hemoglobin: 12.9 g/dL (ref 12.0–15.0)

## 2013-09-22 NOTE — Progress Notes (Signed)
Subjective:    Patient ID: Jamie [name], female    DOB: 1933/01/06, 79 y.o.   MRN: [id number]  HPI The patient is here for annual Medicare wellness examination and management of other chronic and acute problems.  She is feeling good and does not have any major complaints.   The risk factors are reflected in the social history.  The roster of all physicians providing medical care to patient - is listed in the Snapshot section of the chart.  Activities of daily living:  The patient is 100% inedpendent in all ADLs: dressing, toileting, feeding as well as independent mobility  Home safety : The patient has smoke detectors in the home. Falls - none, Home is fall safe. They wear seatbelts. No firearms at home. There is no violence in the home.   There is no risks for hepatitis, STDs or HIV. There is no   history of blood transfusion. They have no travel history to infectious disease endemic areas of the world.  The patient has not seen their dentist in the last six month. They have seen their eye doctor in the last year. They deny any hearing difficulty and have not had audiologic testing in the last year.    They do not  have excessive sun exposure. Discussed the need for sun protection: hats, long sleeves and use of sunscreen if there is significant sun exposure.   Diet: the importance of a healthy diet is discussed. They do have a healthy diet.  The patient has no regular exercise program.  The benefits of regular aerobic exercise were discussed.  Depression screen: there are no signs or vegative symptoms of depression- irritability, change in appetite, anhedonia, sadness/tearfullness.  Cognitive assessment: the patient manages all their financial and personal affairs and is actively engaged.   The following portions of the patient's history were reviewed and updated as appropriate: allergies, current medications, past family history, past medical history,  past surgical history,  past social history  and problem list.  Vision, hearing, body mass index were assessed and reviewed.   During the course of the visit the patient was educated and counseled about appropriate screening and preventive services including : fall prevention , diabetes screening, nutrition counseling, colorectal cancer screening, and recommended immunizations.  Past Medical History  Diagnosis Date  . ANEMIA, IRON DEFICIENCY 09/10/2007  . CATARACT EXTRACTION, HX OF 09/10/2007  . DYSPEPSIA, HX OF 09/10/2007  . HAIR LOSS 03/21/2009  . HAY FEVER 09/10/2007  . HEARING LOSS, SENSORINEURAL 09/10/2007  . HYPERLIPIDEMIA 09/10/2007  . HYPERTENSION 09/10/2007  . LOW BACK PAIN 05/10/2008  . Pain in joint, hand 03/01/2008  . PEPTIC ULCER DISEASE 09/10/2007  . POLYP, COLON 09/10/2007  . POLYPECTOMY, HX OF 09/10/2007  . SHINGLES 08/13/2009  . SHOULDER PAIN, LEFT 02/12/2008  . TAH/BSO, HX OF 09/10/2007   Past Surgical History  Procedure Laterality Date  . Polypectomy    . Cataract extraction    . Irrigation and debridement sebaceous cyst    . Total abdominal hysterectomy w/ bilateral salpingoophorectomy     Family History  Problem Relation Age of Onset  . Hypertension Mother   . Coronary artery disease Mother   . Heart disease Mother     CHF  . Cancer Neg Hx     breast or colon  . Diabetes Neg Hx    History   Social History  . Marital Status: Married    Spouse Name: N/A    Number of Children: 4  .  Years of Education: N/A   Occupational History  . [employer], retire 1998 after 30 yrs    Social History Main Topics  . Smoking status: Never Smoker   . Smokeless tobacco: Not on file  . Alcohol Use: Not on file  . Drug Use: Not on file  . Sexual Activity: Not on file   Other Topics Concern  . Not on file   Social History Narrative   3 sons, 68,59, 45 and 1 daughter 87, 4 grandchildren   SO with multiple medical problems     Current Outpatient  Prescriptions on File Prior to Visit  Medication Sig Dispense Refill  . acetaminophen (TYLENOL) 500 MG tablet Take 500 mg by mouth as needed.        . ALPRAZolam (XANAX) 0.5 MG tablet TAKE ONE (1) TABLET THREE (3) TIMES EACH DAY AS NEEDED FOR SLEEP OR ANXIETY  30 tablet  0  . alum & mag hydroxide-simeth (MYLANTA) 200-200-20 MG/5ML suspension Take by mouth as needed.        Marland Kitchen atorvastatin (LIPITOR) 20 MG tablet Take 1 tablet (20 mg total) by mouth daily.  90 tablet  3  . clotrimazole-betamethasone (LOTRISONE) cream Apply topically 3 (three) times daily.  30 g  3  . DIOVAN 320 MG tablet TAKE ONE (1) TABLET EACH DAY  90 tablet  3  . furosemide (LASIX) 40 MG tablet TAKE ONE (1) TABLET EACH DAY  90 tablet  3  . meclizine (ANTIVERT) 25 MG tablet Take 25 mg by mouth every 6 (six) hours as needed.        . methylcellulose (CITRUCEL) oral powder Take by mouth daily.        . promethazine (PHENERGAN) 12.5 MG tablet TAKE 1 TABLET BY MOUTH EVERY 6 HOURS AS NEEDED FOR NAUSEA  25 tablet  0  . ranitidine (ZANTAC) 150 MG tablet TAKE ONE TABLET TWICE DAILY  180 tablet  1   No current facility-administered medications on file prior to visit.      Review of Systems Constitutional:  Negative for fever, chills, activity change and unexpected weight change.  HEENT:  Negative for hearing loss, ear pain, congestion, neck stiffness and postnasal drip. Negative for sore throat or swallowing problems. Negative for dental complaints.   Eyes: Negative for vision loss or change in visual acuity.  Respiratory: Negative for chest tightness and wheezing. Negative for DOE.   Cardiovascular: Negative for chest pain or palpitations. No decreased exercise tolerance Gastrointestinal: No change in bowel habit. Positive bloating or gas. Positive for  reflux or indigestion Genitourinary: Negative for urgency, frequency, flank pain and difficulty urinating.  Musculoskeletal: Negative for myalgias, back pain, arthralgias and gait  problem.  Neurological: Negative for dizziness, tremors, weakness and headaches.  Hematological: Negative for adenopathy.  Psychiatric/Behavioral: Negative for behavioral problems and dysphoric mood.       Objective:   Physical Exam Filed Vitals:   09/22/13 1332  BP: 146/60  Pulse: 75  Temp: 98.1 F (36.7 C)   Wt Readings from Last 3 Encounters:  09/22/13 161 lb 6.4 oz (73.211 kg)  01/19/13 165 lb (74.844 kg)  09/21/12 164 lb (74.39 kg)   Gen'l: well nourished, well developed Woman in no distress HEENT - [place]/AT, EACs/TMs normal, oropharynx with native dentition in good condition, no buccal or palatal lesions, posterior pharynx clear, mucous membranes moist. C&S clear, PERRLA Neck - supple, no thyromegaly Nodes- negative submental, cervical, supraclavicular regions Chest - no deformity, no CVAT  Lungs - clear without rales, wheezes. No increased work of breathing Breast - pre gyn and mammogram Cardiovascular - regular rate and rhythm, quiet precordium, no murmurs, rubs or gallops, 2+ radial, DP and PT pulses Abdomen - BS+ x 4, no HSM, no guarding or rebound or tenderness Pelvic - deferred to gyn Rectal - deferred to gyn Extremities - no clubbing, cyanosis, edema or deformity.  Neuro - A&O x 3, CN II-XII normal, motor strength normal and equal, DTRs 2+ and symmetrical biceps, radial, and patellar tendons. Cerebellar - no tremor, no rigidity, fluid movement and normal gait. Derm - Head, neck, back, abdomen and extremities without suspicious lesions  Recent Results (from the past 2160 hour(s))  HEPATIC FUNCTION PANEL     Status: Abnormal   Collection Time    09/22/13  2:51 PM      Result Value Range   Total Bilirubin 0.6  0.3 - 1.2 mg/dL   Bilirubin, Direct 0.1  0.0 - 0.3 mg/dL   Alkaline Phosphatase 152 (*) 39 - 117 U/L   AST 20  0 - 37 U/L   ALT 14  0 - 35 U/L   Total Protein 7.7  6.0 - 8.3 g/dL   Albumin 4.2  3.5 - 5.2 g/dL  COMPREHENSIVE METABOLIC PANEL     Status:  Abnormal   Collection Time    09/22/13  2:51 PM      Result Value Range   Sodium 141  135 - 145 mEq/L   Potassium 4.3  3.5 - 5.1 mEq/L   Chloride 105  96 - 112 mEq/L   CO2 29  19 - 32 mEq/L   Glucose, Bld 78  70 - 99 mg/dL   BUN 11  6 - 23 mg/dL   Creatinine, Ser 0.8  0.4 - 1.2 mg/dL   Total Bilirubin 0.6  0.3 - 1.2 mg/dL   Alkaline Phosphatase 152 (*) 39 - 117 U/L   AST 20  0 - 37 U/L   ALT 14  0 - 35 U/L   Total Protein 7.7  6.0 - 8.3 g/dL   Albumin 4.2  3.5 - 5.2 g/dL   Calcium 9.3  8.4 - 16.1 mg/dL   GFR 09.60  >45.40 mL/min  LIPID PANEL     Status: None   Collection Time    09/22/13  2:51 PM      Result Value Range   Cholesterol 143  0 - 200 mg/dL   Comment: ATP III Classification       Desirable:  < 200 mg/dL               Borderline High:  200 - 239 mg/dL          High:  > = 981 mg/dL   Triglycerides 19.1  0.0 - 149.0 mg/dL   Comment: Normal:  <478 mg/dLBorderline High:  150 - 199 mg/dL   HDL 29.56  >21.30 mg/dL   VLDL 7.6  0.0 - 86.5 mg/dL   LDL Cholesterol 86  0 - 99 mg/dL   Total CHOL/HDL Ratio 3     Comment:                Men          Women1/2 Average Risk     3.4          3.3Average Risk          5.0          4.42X Average Risk  9.6          7.13X Average Risk          15.0          11.0                      HEMOGLOBIN AND HEMATOCRIT, BLOOD     Status: None   Collection Time    09/22/13  2:51 PM      Result Value Range   Hemoglobin 12.9  12.0 - 15.0 g/dL   HCT 46.9  62.9 - 52.8 %  IBC PANEL     Status: None   Collection Time    09/22/13  2:51 PM      Result Value Range   Iron 96  42 - 145 ug/dL   Transferrin 413.2  440.1 - 360.0 mg/dL   Saturation Ratios 02.7  20.0 - 50.0 %         Assessment & Plan:

## 2013-09-22 NOTE — Patient Instructions (Signed)
Good to see you, especially since you are doing so well  Callus - shaved down the callus on the left heel and toe pad. Will refer to Dr. Harriet Pho for podiatry consult  Routine lab today with a letter to follow  Immunizations:  Prevnar today - once and done for extra protection against pneumonia. You can get a flu shot in 2 weeks and the shingles vaccine at a participating drug store.  Your exam is normal today.  Wart on the left index finger - treated today with liquid nitrogen., Very likely will need another treatment in a couple of weeks. If this gets red, angry, swollen, if there is fever or drainage come see me right away.  Health maintenance is up to date with colonoscopy and gyn.

## 2013-09-26 NOTE — Assessment & Plan Note (Signed)
Interval history is negative. Physical exam, sans pelvic, is normal. Lab results are in normal range. She is current with colorectal and breast cancer screening. Immunizations are up to date.   In summary A very nice woman who appears to be medically stable.

## 2013-09-26 NOTE — Assessment & Plan Note (Signed)
No complaints or symptoms at today's exam. Lab: Hgb 12.9. Fe 96, Sat% 24 - normal

## 2013-09-26 NOTE — Assessment & Plan Note (Signed)
BP Readings from Last 3 Encounters:  09/22/13 146/60  01/19/13 130/64  09/21/12 130/62   Adequate control on present medications  Plan No change in regimen

## 2013-09-26 NOTE — Assessment & Plan Note (Signed)
Taking and tolerating atorvastatin w/o adverse effects.  Lab reveals excellent control with LDL better than goal of 100 or less. LFTs normal  Plan Continue present medications

## 2013-09-29 ENCOUNTER — Encounter: Payer: Self-pay | Admitting: Internal Medicine

## 2013-10-11 ENCOUNTER — Ambulatory Visit (INDEPENDENT_AMBULATORY_CARE_PROVIDER_SITE_OTHER): Payer: Medicare PPO

## 2013-10-11 DIAGNOSIS — Z23 Encounter for immunization: Secondary | ICD-10-CM

## 2013-10-19 ENCOUNTER — Other Ambulatory Visit: Payer: Self-pay | Admitting: Internal Medicine

## 2013-12-13 ENCOUNTER — Encounter: Payer: Self-pay | Admitting: Internal Medicine

## 2013-12-13 ENCOUNTER — Ambulatory Visit (INDEPENDENT_AMBULATORY_CARE_PROVIDER_SITE_OTHER): Payer: Medicare PPO | Admitting: Internal Medicine

## 2013-12-13 VITALS — BP 134/76 | HR 67 | Temp 97.8°F | Wt 157.0 lb

## 2013-12-13 DIAGNOSIS — H6120 Impacted cerumen, unspecified ear: Secondary | ICD-10-CM

## 2013-12-13 DIAGNOSIS — H6123 Impacted cerumen, bilateral: Secondary | ICD-10-CM

## 2013-12-13 NOTE — Progress Notes (Signed)
Pre visit review using our clinic review tool, if applicable. No additional management support is needed unless otherwise documented below in the visit note. 

## 2013-12-13 NOTE — Progress Notes (Signed)
   Subjective:    Patient ID: Jamie [name], female    DOB: 03-Aug-1933, 80 y.o.   MRN: [id number]  HPI Mrs. Uselton presents for decreased hearing both ears with a concern for cerumen impaction which has been a problem in the past. No pain, no drainage, no tinnitis.  PMH, FamHx and SocHx reviewed for any changes and relevance.  \ Current Outpatient Prescriptions on File Prior to Visit  Medication Sig Dispense Refill  . acetaminophen (TYLENOL) 500 MG tablet Take 500 mg by mouth as needed.        . ALPRAZolam (XANAX) 0.5 MG tablet TAKE ONE (1) TABLET THREE (3) TIMES EACH DAY AS NEEDED FOR SLEEP OR ANXIETY  30 tablet  0  . alum & mag hydroxide-simeth (MYLANTA) 500-938-18 MG/5ML suspension Take by mouth as needed.        Marland Kitchen atorvastatin (LIPITOR) 20 MG tablet Take 1 tablet (20 mg total) by mouth daily.  90 tablet  3  . clotrimazole-betamethasone (LOTRISONE) cream APPLY TOPICALLY 3 TIMES A DAY  30 g  1  . DIOVAN 320 MG tablet TAKE ONE (1) TABLET EACH DAY  90 tablet  3  . furosemide (LASIX) 40 MG tablet TAKE ONE (1) TABLET EACH DAY  90 tablet  3  . meclizine (ANTIVERT) 25 MG tablet Take 25 mg by mouth every 6 (six) hours as needed.        . methylcellulose (CITRUCEL) oral powder Take by mouth daily.        . promethazine (PHENERGAN) 12.5 MG tablet TAKE 1 TABLET BY MOUTH EVERY 6 HOURS AS NEEDED FOR NAUSEA  25 tablet  0  . ranitidine (ZANTAC) 150 MG tablet TAKE ONE TABLET TWICE DAILY  180 tablet  1   No current facility-administered medications on file prior to visit.      Review of Systems System review is negative for any constitutional, cardiac, pulmonary, GI or neuro symptoms or complaints other than as described in the HPI.     Objective:   Physical Exam Filed Vitals:   12/13/13 0957  BP: 134/76  Pulse: 67  Temp: 97.8 F (36.6 C)   Gen'l - WNWD woman in no distress HEENT - Cerumen impaction bilaterally       Assessment & Plan:  Cerumen impaction - CMA performed  irrigation w/o complication and good results.

## 2014-01-04 ENCOUNTER — Encounter: Payer: Self-pay | Admitting: Internal Medicine

## 2014-01-04 ENCOUNTER — Ambulatory Visit (INDEPENDENT_AMBULATORY_CARE_PROVIDER_SITE_OTHER): Payer: Medicare PPO | Admitting: Internal Medicine

## 2014-01-04 VITALS — BP 140/60 | HR 69 | Temp 98.4°F | Wt 155.0 lb

## 2014-01-04 DIAGNOSIS — H905 Unspecified sensorineural hearing loss: Secondary | ICD-10-CM

## 2014-01-04 NOTE — Progress Notes (Signed)
Pre visit review using our clinic review tool, if applicable. No additional management support is needed unless otherwise documented below in the visit note. 

## 2014-01-04 NOTE — Patient Instructions (Signed)
Decreased hearing - on exam there is a clear ear canal - no wax build up. The problem may be eustachian tube dysfunction - poor air ventilation of the ear - vs true hearing loss.  Plan Try taking sudafed (generic)30 mg (from behind the counter) twice a day for several day to see if this helps your hearing. If it does the problem is ventilation. If it doesn'l help we will need to get your hearing tested.    Barotitis Media Barotitis media is inflammation of your middle ear. This occurs when the auditory tube (eustachian tube) leading from the back of your nose (nasopharynx) to your eardrum is blocked. This blockage may result from a cold, environmental allergies, or an upper respiratory infection. Unresolved barotitis media may lead to damage or hearing loss (barotrauma), which may become permanent. HOME CARE INSTRUCTIONS   Use medicines as recommended by your health care provider. Over-the-counter medicines will help unblock the canal and can help during times of air travel.  Do not put anything into your ears to clean or unplug them. Eardrops will not be helpful.  Do not swim, dive, or fly until your health care provider says it is all right to do so. If these activities are necessary, chewing gum with frequent, forceful swallowing may help. It is also helpful to hold your nose and gently blow to pop your ears for equalizing pressure changes. This forces air into the eustachian tube.  Only take over-the-counter or prescription medicines for pain, discomfort, or fever as directed by your health care provider.  A decongestant may be helpful in decongesting the middle ear and make pressure equalization easier. SEEK MEDICAL CARE IF:  You experience a serious form of dizziness in which you feel as if the room is spinning and you feel nauseated (vertigo).  Your symptoms only involve one ear. SEEK IMMEDIATE MEDICAL CARE IF:   You develop a severe headache, dizziness, or severe ear pain.  You  have bloody or pus-like drainage from your ears.  You develop a fever.  Your problems do not improve or become worse. MAKE SURE YOU:   Understand these instructions.  Will watch your condition.  Will get help right away if you are not doing well or get worse. Document Released: 11/14/2000 Document Revised: 09/07/2013 Document Reviewed: 06/14/2013 [facility] Patient Information 2014 [place], [place].

## 2014-01-04 NOTE — Progress Notes (Signed)
   Subjective:    Patient ID: Jamie [name], female    DOB: 1933-10-26, 80 y.o.   MRN: [id number]  HPI Jamie [name] presents for crackling sounds in the ears with decreased hearing. No pain, no tinnitus. Has had cerumen impactions in the past.   PMH, FamHx and SocHx reviewed for any changes and relevance.  Current Outpatient Prescriptions on File Prior to Visit  Medication Sig Dispense Refill  . acetaminophen (TYLENOL) 500 MG tablet Take 500 mg by mouth as needed.        . ALPRAZolam (XANAX) 0.5 MG tablet TAKE ONE (1) TABLET THREE (3) TIMES EACH DAY AS NEEDED FOR SLEEP OR ANXIETY  30 tablet  0  . alum & mag hydroxide-simeth (MYLANTA) 585-277-82 MG/5ML suspension Take by mouth as needed.        Marland Kitchen atorvastatin (LIPITOR) 20 MG tablet Take 1 tablet (20 mg total) by mouth daily.  90 tablet  3  . clotrimazole-betamethasone (LOTRISONE) cream APPLY TOPICALLY 3 TIMES A DAY  30 g  1  . DIOVAN 320 MG tablet TAKE ONE (1) TABLET EACH DAY  90 tablet  3  . furosemide (LASIX) 40 MG tablet TAKE ONE (1) TABLET EACH DAY  90 tablet  3  . meclizine (ANTIVERT) 25 MG tablet Take 25 mg by mouth every 6 (six) hours as needed.        . methylcellulose (CITRUCEL) oral powder Take by mouth daily.        . promethazine (PHENERGAN) 12.5 MG tablet TAKE 1 TABLET BY MOUTH EVERY 6 HOURS AS NEEDED FOR NAUSEA  25 tablet  0  . ranitidine (ZANTAC) 150 MG tablet TAKE ONE TABLET TWICE DAILY  180 tablet  1   No current facility-administered medications on file prior to visit.      Review of Systems System review is negative for any constitutional, cardiac, pulmonary, GI or neuro symptoms or complaints other than as described in the HPI.     Objective:   Physical Exam Filed Vitals:   01/04/14 0849  BP: 140/60  Pulse: 69  Temp: 98.4 F (36.9 C)   HEENT- EAC clear., decreased hearing to 256 Hz tuning fork. Cor - RRR Pulm - normal respirations Neuor - A&O x 3       Assessment & Plan:  Hearing loss - no  evidence of cerumen impaction or infection. Suspect eustachian tube dysfunction.  Plan otc pseudoephedrin low dose  If no relief will need audiology evaluation.

## 2014-01-10 ENCOUNTER — Telehealth: Payer: Self-pay | Admitting: *Deleted

## 2014-01-10 DIAGNOSIS — H905 Unspecified sensorineural hearing loss: Secondary | ICD-10-CM

## 2014-01-10 NOTE — Telephone Encounter (Signed)
Will refer to [facility] for audiology eval.

## 2014-01-10 NOTE — Telephone Encounter (Signed)
Patient phoned and stated she had been taking the recommended sudafed for her ears and she is still having a problem; unable to hear clearly.  Last OV with PCP 01/04/14.  States PCP advised her to call back if not improved.  Please advise if you wish for pt to schedule f/u.   CB# [phone]

## 2014-01-11 NOTE — Telephone Encounter (Signed)
Notified patient of MD response and action.  Understanding and appreciation verbalized

## 2014-01-23 ENCOUNTER — Encounter: Payer: Self-pay | Admitting: Internal Medicine

## 2014-01-28 ENCOUNTER — Other Ambulatory Visit: Payer: Self-pay | Admitting: Internal Medicine

## 2014-01-31 ENCOUNTER — Telehealth: Payer: Self-pay | Admitting: Internal Medicine

## 2014-01-31 NOTE — Telephone Encounter (Signed)
Rec'd from [facility] and Throat forward 2 pages to Dr.Norins

## 2014-02-23 ENCOUNTER — Ambulatory Visit (INDEPENDENT_AMBULATORY_CARE_PROVIDER_SITE_OTHER): Payer: Medicare PPO | Admitting: Internal Medicine

## 2014-02-23 ENCOUNTER — Encounter: Payer: Self-pay | Admitting: Internal Medicine

## 2014-02-23 VITALS — BP 140/58 | HR 80 | Temp 98.6°F | Wt 153.0 lb

## 2014-02-23 DIAGNOSIS — M25549 Pain in joints of unspecified hand: Secondary | ICD-10-CM

## 2014-02-23 NOTE — Progress Notes (Signed)
Pre visit review using our clinic review tool, if applicable. No additional management support is needed unless otherwise documented below in the visit note. 

## 2014-02-23 NOTE — Progress Notes (Signed)
   Subjective:    Patient ID: [name], female    DOB: Jun 22, 1933, 80 y.o.   MRN: [id number]  HPI Mrs. Meddaugh presents for evaluation of progressive hand stiffness and discomfort. She has had increased pain at the base of the right thumb. Her morning gel period is about 10 minutes. She has not been taking any medication.   PMH, FamHx and SocHx reviewed for any changes and relevance. \ \ Current Outpatient Prescriptions on File Prior to Visit  Medication Sig Dispense Refill  . acetaminophen (TYLENOL) 500 MG tablet Take 500 mg by mouth as needed.        . ALPRAZolam (XANAX) 0.5 MG tablet TAKE ONE (1) TABLET THREE (3) TIMES EACH DAY AS NEEDED FOR SLEEP OR ANXIETY  30 tablet  0  . alum & mag hydroxide-simeth (MYLANTA) 409-811-91 MG/5ML suspension Take by mouth as needed.        Marland Kitchen atorvastatin (LIPITOR) 20 MG tablet Take 1 tablet (20 mg total) by mouth daily.  90 tablet  3  . clotrimazole-betamethasone (LOTRISONE) cream APPLY TOPICALLY 3 TIMES A DAY  30 g  1  . DIOVAN 320 MG tablet TAKE ONE (1) TABLET EACH DAY  90 tablet  3  . furosemide (LASIX) 40 MG tablet TAKE ONE (1) TABLET EACH DAY  90 tablet  3  . meclizine (ANTIVERT) 25 MG tablet Take 25 mg by mouth every 6 (six) hours as needed.        . methylcellulose (CITRUCEL) oral powder Take by mouth daily.        . promethazine (PHENERGAN) 12.5 MG tablet TAKE 1 TABLET BY MOUTH EVERY 6 HOURS AS NEEDED FOR NAUSEA  25 tablet  0  . ranitidine (ZANTAC) 150 MG tablet TAKE ONE TABLET TWICE DAILY  180 tablet  1   No current facility-administered medications on file prior to visit.      Review of Systems System review is negative for any constitutional, cardiac, pulmonary, GI or neuro symptoms or complaints other than as described in the HPI.     Objective:   Physical Exam Filed Vitals:   02/23/14 1645  BP: 140/58  Pulse: 80  Temp: 98.6 F (37 C)   MSK - tender at the MTP joint right 1st.       Assessment & Plan:

## 2014-02-23 NOTE — Patient Instructions (Signed)
Osteoarthritis - the hand stiffness in the morning and some joint pain, especially at the base of the thumb is early arthritis.  Plan Immerse your hands in warm water in the morning to loosen them up  Use a soft rubber ball to keep the hand strong and flexible  For the soreness at the base of the thumb use a rub of choice, like Aspercreme, Ben-Gay, etc  For unrelieved pain take an anti-inflammatory drug like aleve or advil.   Osteoarthritis Osteoarthritis is a disease that causes soreness and swelling (inflammation) of a joint. It occurs when the cartilage at the affected joint wears down. Cartilage acts as a cushion, covering the ends of bones where they meet to form a joint. Osteoarthritis is the most common form of arthritis. It often occurs in older people. The joints affected most often by this condition include those in the:  Ends of the fingers.  Thumbs.  Neck.  Lower back.  Knees.  Hips. CAUSES  Over time, the cartilage that covers the ends of bones begins to wear away. This causes bone to rub on bone, producing pain and stiffness in the affected joints.  RISK FACTORS Certain factors can increase your chances of having osteoarthritis, including:  Older age.  Excessive body weight.  Overuse of joints. SIGNS AND SYMPTOMS   Pain, swelling, and stiffness in the joint.  Over time, the joint may lose its normal shape.  Small deposits of bone (osteophytes) may grow on the edges of the joint.  Bits of bone or cartilage can break off and float inside the joint space. This may cause more pain and damage. DIAGNOSIS  Your health care provider will do a physical exam and ask about your symptoms. Various tests may be ordered, such as:  X-rays of the affected joint.  An MRI scan.  Blood tests to rule out other types of arthritis.  Joint fluid tests. This involves using a needle to draw fluid from the joint and examining the fluid under a microscope. TREATMENT  Goals of  treatment are to control pain and improve joint function. Treatment plans may include:  A prescribed exercise program that allows for rest and joint relief.  A weight control plan.  Pain relief techniques, such as:  Properly applied heat and cold.  Electric pulses delivered to nerve endings under the skin (transcutaneous electrical nerve stimulation, TENS).  Massage.  Certain nutritional supplements.  Medicines to control pain, such as:  Acetaminophen.  Nonsteroidal anti-inflammatory drugs (NSAIDs), such as naproxen.  Narcotic or central-acting agents, such as tramadol.  Corticosteroids. These can be given orally or as an injection.  Surgery to reposition the bones and relieve pain (osteotomy) or to remove loose pieces of bone and cartilage. Joint replacement may be needed in advanced states of osteoarthritis. HOME CARE INSTRUCTIONS   Only take over-the-counter or prescription medicines as directed by your health care provider. Take all medicines exactly as instructed.  Maintain a healthy weight. Follow your health care provider's instructions for weight control. This may include dietary instructions.  Exercise as directed. Your health care provider can recommend specific types of exercise. These may include:  Strengthening exercises These are done to strengthen the muscles that support joints affected by arthritis. They can be performed with weights or with exercise bands to add resistance.  Aerobic activities These are exercises, such as brisk walking or low-impact aerobics, that get your heart pumping.  Range-of-motion activities These keep your joints limber.  Balance and agility exercises These help  you maintain daily living skills.  Rest your affected joints as directed by your health care provider.  Follow up with your health care provider as directed. SEEK MEDICAL CARE IF:   Your skin turns red.  You develop a rash in addition to your joint pain.  You have  worsening joint pain. SEEK IMMEDIATE MEDICAL CARE IF:  You have a significant loss of weight or appetite.  You have a fever along with joint or muscle aches.  You have night sweats. [place] of Arthritis and Musculoskeletal and Skin Diseases: www.niams.SouthExposed.es [employer] on Aging: http://kim-miller.com/ American College of Rheumatology: www.rheumatology.org Document Released: 11/17/2005 Document Revised: 09/07/2013 Document Reviewed: 07/25/2013 [facility] Patient Information 2014 [place], [place].

## 2014-03-01 NOTE — Assessment & Plan Note (Signed)
Osteoarthritis - the hand stiffness in the morning and some joint pain, especially at the base of the thumb is early arthritis.  Plan Immerse your hands in warm water in the morning to loosen them up  Use a soft rubber ball to keep the hand strong and flexible  For the soreness at the base of the thumb use a rub of choice, like Aspercreme, Ben-Gay, etc  For unrelieved pain take an anti-inflammatory drug like aleve or advil.

## 2014-04-21 ENCOUNTER — Encounter (HOSPITAL_COMMUNITY): Payer: Self-pay | Admitting: Emergency Medicine

## 2014-04-21 ENCOUNTER — Emergency Department (HOSPITAL_COMMUNITY): Payer: Medicare PPO

## 2014-04-21 ENCOUNTER — Emergency Department (HOSPITAL_COMMUNITY)
Admission: EM | Admit: 2014-04-21 | Discharge: 2014-04-21 | Disposition: A | Discharge status: Home / Self Care | Payer: Medicare PPO | Attending: Emergency Medicine | Admitting: Emergency Medicine

## 2014-04-21 DIAGNOSIS — Z8719 Personal history of other diseases of the digestive system: Secondary | ICD-10-CM | POA: Insufficient documentation

## 2014-04-21 DIAGNOSIS — Z9079 Acquired absence of other genital organ(s): Secondary | ICD-10-CM | POA: Insufficient documentation

## 2014-04-21 DIAGNOSIS — J069 Acute upper respiratory infection, unspecified: Secondary | ICD-10-CM | POA: Insufficient documentation

## 2014-04-21 DIAGNOSIS — R5381 Other malaise: Secondary | ICD-10-CM | POA: Insufficient documentation

## 2014-04-21 DIAGNOSIS — R197 Diarrhea, unspecified: Secondary | ICD-10-CM

## 2014-04-21 DIAGNOSIS — N390 Urinary tract infection, site not specified: Secondary | ICD-10-CM | POA: Insufficient documentation

## 2014-04-21 DIAGNOSIS — Z8619 Personal history of other infectious and parasitic diseases: Secondary | ICD-10-CM | POA: Insufficient documentation

## 2014-04-21 DIAGNOSIS — Z862 Personal history of diseases of the blood and blood-forming organs and certain disorders involving the immune mechanism: Secondary | ICD-10-CM | POA: Insufficient documentation

## 2014-04-21 DIAGNOSIS — R5383 Other fatigue: Secondary | ICD-10-CM

## 2014-04-21 DIAGNOSIS — Z90710 Acquired absence of both cervix and uterus: Secondary | ICD-10-CM | POA: Insufficient documentation

## 2014-04-21 DIAGNOSIS — I10 Essential (primary) hypertension: Secondary | ICD-10-CM | POA: Insufficient documentation

## 2014-04-21 DIAGNOSIS — Z79899 Other long term (current) drug therapy: Secondary | ICD-10-CM | POA: Insufficient documentation

## 2014-04-21 DIAGNOSIS — E785 Hyperlipidemia, unspecified: Secondary | ICD-10-CM | POA: Insufficient documentation

## 2014-04-21 DIAGNOSIS — Z9849 Cataract extraction status, unspecified eye: Secondary | ICD-10-CM | POA: Insufficient documentation

## 2014-04-21 LAB — CBC WITH DIFFERENTIAL/PLATELET
BASOS ABS: 0 10*3/uL (ref 0.0–0.1)
BASOS PCT: 0 % (ref 0–1)
EOS PCT: 1 % (ref 0–5)
Eosinophils Absolute: 0.1 10*3/uL (ref 0.0–0.7)
HCT: 34.5 % — ABNORMAL LOW (ref 36.0–46.0)
Hemoglobin: 11.2 g/dL — ABNORMAL LOW (ref 12.0–15.0)
Lymphocytes Relative: 11 % — ABNORMAL LOW (ref 12–46)
Lymphs Abs: 1 10*3/uL (ref 0.7–4.0)
MCH: 31.5 pg (ref 26.0–34.0)
MCHC: 32.5 g/dL (ref 30.0–36.0)
MCV: 96.9 fL (ref 78.0–100.0)
MONO ABS: 0.5 10*3/uL (ref 0.1–1.0)
Monocytes Relative: 5 % (ref 3–12)
NEUTROS ABS: 7.1 10*3/uL (ref 1.7–7.7)
Neutrophils Relative %: 83 % — ABNORMAL HIGH (ref 43–77)
Platelets: 180 10*3/uL (ref 150–400)
RBC: 3.56 MIL/uL — ABNORMAL LOW (ref 3.87–5.11)
RDW: 13.4 % (ref 11.5–15.5)
WBC: 8.7 10*3/uL (ref 4.0–10.5)

## 2014-04-21 LAB — URINALYSIS, ROUTINE W REFLEX MICROSCOPIC
BILIRUBIN URINE: NEGATIVE
Glucose, UA: NEGATIVE mg/dL
Hgb urine dipstick: NEGATIVE
KETONES UR: NEGATIVE mg/dL
Nitrite: NEGATIVE
PH: 7 (ref 5.0–8.0)
PROTEIN: 30 mg/dL — AB
Specific Gravity, Urine: 1.027 (ref 1.005–1.030)
UROBILINOGEN UA: 1 mg/dL (ref 0.0–1.0)

## 2014-04-21 LAB — BASIC METABOLIC PANEL
BUN: 12 mg/dL (ref 6–23)
CO2: 24 mEq/L (ref 19–32)
CREATININE: 0.72 mg/dL (ref 0.50–1.10)
Calcium: 8.8 mg/dL (ref 8.4–10.5)
Chloride: 104 mEq/L (ref 96–112)
GFR calc non Af Amer: 80 mL/min — ABNORMAL LOW (ref >90)
Glucose, Bld: 106 mg/dL — ABNORMAL HIGH (ref 70–99)
Potassium: 4 mEq/L (ref 3.7–5.3)
Sodium: 139 mEq/L (ref 137–147)

## 2014-04-21 LAB — URINE MICROSCOPIC-ADD ON

## 2014-04-21 MED ORDER — ONDANSETRON HCL 4 MG PO TABS
4.0000 mg | ORAL_TABLET | Freq: Once | ORAL | Status: AC
  Administered 2014-04-21: 4 mg via ORAL
  Filled 2014-04-21: qty 1

## 2014-04-21 MED ORDER — BENZONATATE 100 MG PO CAPS
100.0000 mg | ORAL_CAPSULE | Freq: Once | ORAL | Status: AC
  Administered 2014-04-21: 100 mg via ORAL
  Filled 2014-04-21: qty 1

## 2014-04-21 MED ORDER — LEVOFLOXACIN 500 MG PO TABS
500.0000 mg | ORAL_TABLET | Freq: Once | ORAL | Status: AC
  Administered 2014-04-21: 500 mg via ORAL
  Filled 2014-04-21 (×2): qty 1

## 2014-04-21 MED ORDER — LEVOFLOXACIN 500 MG PO TABS: 500.0000 mg | ORAL_TABLET | Freq: Every day | ORAL | Status: DC

## 2014-04-21 MED ORDER — ONDANSETRON HCL 4 MG PO TABS: 4.0000 mg | ORAL_TABLET | Freq: Four times a day (QID) | ORAL | Status: DC

## 2014-04-21 MED ORDER — ALBUTEROL SULFATE HFA 108 (90 BASE) MCG/ACT IN AERS
2.0000 | INHALATION_SPRAY | RESPIRATORY_TRACT | Status: DC | PRN
Start: 2014-04-21 — End: 2014-04-21
  Administered 2014-04-21: 2 via RESPIRATORY_TRACT
  Filled 2014-04-21: qty 6.7

## 2014-04-21 MED ORDER — SODIUM CHLORIDE 0.9 % IV SOLN
INTRAVENOUS | Status: DC
  Administered 2014-04-21: 08:00:00 via INTRAVENOUS

## 2014-04-21 MED ORDER — BENZONATATE 100 MG PO CAPS: 100.0000 mg | ORAL_CAPSULE | Freq: Three times a day (TID) | ORAL | Status: DC

## 2014-04-21 NOTE — Discharge Instructions (Signed)
Urinary Tract Infection Urinary tract infections (UTIs) can develop anywhere along your urinary tract. Your urinary tract is your body's drainage system for removing wastes and extra water. Your urinary tract includes two kidneys, two ureters, a bladder, and a urethra. Your kidneys are a pair of bean-shaped organs. Each kidney is about the size of your fist. They are located below your ribs, one on each side of your spine. CAUSES Infections are caused by microbes, which are microscopic organisms, including fungi, viruses, and bacteria. These organisms are so small that they can only be seen through a microscope. Bacteria are the microbes that most commonly cause UTIs. SYMPTOMS  Symptoms of UTIs may vary by age and gender of the patient and by the location of the infection. Symptoms in young women typically include a frequent and intense urge to urinate and a painful, burning feeling in the bladder or urethra during urination. Older women and men are more likely to be tired, shaky, and weak and have muscle aches and abdominal pain. A fever may mean the infection is in your kidneys. Other symptoms of a kidney infection include pain in your back or sides below the ribs, nausea, and vomiting. DIAGNOSIS To diagnose a UTI, your caregiver will ask you about your symptoms. Your caregiver also will ask to provide a urine sample. The urine sample will be tested for bacteria and white blood cells. White blood cells are made by your body to help fight infection. TREATMENT  Typically, UTIs can be treated with medication. Because most UTIs are caused by a bacterial infection, they usually can be treated with the use of antibiotics. The choice of antibiotic and length of treatment depend on your symptoms and the type of bacteria causing your infection. HOME CARE INSTRUCTIONS  If you were prescribed antibiotics, take them exactly as your caregiver instructs you. Finish the medication even if you feel better after you  have only taken some of the medication.  Drink enough water and fluids to keep your urine clear or pale yellow.  Avoid caffeine, tea, and carbonated beverages. They tend to irritate your bladder.  Empty your bladder often. Avoid holding urine for long periods of time.  Empty your bladder before and after sexual intercourse.  After a bowel movement, women should cleanse from front to back. Use each tissue only once. SEEK MEDICAL CARE IF:   You have back pain.  You develop a fever.  Your symptoms do not begin to resolve within 3 days. SEEK IMMEDIATE MEDICAL CARE IF:   You have severe back pain or lower abdominal pain.  You develop chills.  You have nausea or vomiting.  You have continued burning or discomfort with urination. MAKE SURE YOU:   Understand these instructions.  Will watch your condition.  Will get help right away if you are not doing well or get worse. Document Released: 08/27/2005 Document Revised: 05/18/2012 Document Reviewed: 12/26/2011 [facility] Patient Information 2014 [place].  Upper Respiratory Infection, Adult An upper respiratory infection (URI) is also sometimes known as the common cold. The upper respiratory tract includes the nose, sinuses, throat, trachea, and bronchi. Bronchi are the airways leading to the lungs. Most people improve within 1 week, but symptoms can last up to 2 weeks. A residual cough may last even longer.  CAUSES Many different viruses can infect the tissues lining the upper respiratory tract. The tissues become irritated and inflamed and often become very moist. Mucus production is also common. A cold is contagious. You can easily  spread the virus to others by oral contact. This includes kissing, sharing a glass, coughing, or sneezing. Touching your mouth or nose and then touching a surface, which is then touched by another person, can also spread the virus. SYMPTOMS  Symptoms typically develop 1 to 3 days after you come in  contact with a cold virus. Symptoms vary from person to person. They may include:  Runny nose.  Sneezing.  Nasal congestion.  Sinus irritation.  Sore throat.  Loss of voice (laryngitis).  Cough.  Fatigue.  Muscle aches.  Loss of appetite.  Headache.  Low-grade fever. DIAGNOSIS  You might diagnose your own cold based on familiar symptoms, since most people get a cold 2 to 3 times a year. Your caregiver can confirm this based on your exam. Most importantly, your caregiver can check that your symptoms are not due to another disease such as strep throat, sinusitis, pneumonia, asthma, or epiglottitis. Blood tests, throat tests, and X-rays are not necessary to diagnose a common cold, but they may sometimes be helpful in excluding other more serious diseases. Your caregiver will decide if any further tests are required. RISKS AND COMPLICATIONS  You may be at risk for a more severe case of the common cold if you smoke cigarettes, have chronic heart disease (such as heart failure) or lung disease (such as asthma), or if you have a weakened immune system. The very young and very old are also at risk for more serious infections. Bacterial sinusitis, middle ear infections, and bacterial pneumonia can complicate the common cold. The common cold can worsen asthma and chronic obstructive pulmonary disease (COPD). Sometimes, these complications can require emergency medical care and may be life-threatening. PREVENTION  The best way to protect against getting a cold is to practice good hygiene. Avoid oral or hand contact with people with cold symptoms. Wash your hands often if contact occurs. There is no clear evidence that vitamin C, vitamin E, echinacea, or exercise reduces the chance of developing a cold. However, it is always recommended to get plenty of rest and practice good nutrition. TREATMENT  Treatment is directed at relieving symptoms. There is no cure. Antibiotics are not effective,  because the infection is caused by a virus, not by bacteria. Treatment may include:  Increased fluid intake. Sports drinks offer valuable electrolytes, sugars, and fluids.  Breathing heated mist or steam (vaporizer or shower).  Eating chicken soup or other clear broths, and maintaining good nutrition.  Getting plenty of rest.  Using gargles or lozenges for comfort.  Controlling fevers with ibuprofen or acetaminophen as directed by your caregiver.  Increasing usage of your inhaler if you have asthma. Zinc gel and zinc lozenges, taken in the first 24 hours of the common cold, can shorten the duration and lessen the severity of symptoms. Pain medicines may help with fever, muscle aches, and throat pain. A variety of non-prescription medicines are available to treat congestion and runny nose. Your caregiver can make recommendations and may suggest nasal or lung inhalers for other symptoms.  HOME CARE INSTRUCTIONS   Only take over-the-counter or prescription medicines for pain, discomfort, or fever as directed by your caregiver.  Use a warm mist humidifier or inhale steam from a shower to increase air moisture. This may keep secretions moist and make it easier to breathe.  Drink enough water and fluids to keep your urine clear or pale yellow.  Rest as needed.  Return to work when your temperature has returned to normal or as your  caregiver advises. You may need to stay home longer to avoid infecting others. You can also use a face mask and careful hand washing to prevent spread of the virus. SEEK MEDICAL CARE IF:   After the first few days, you feel you are getting worse rather than better.  You need your caregiver's advice about medicines to control symptoms.  You develop chills, worsening shortness of breath, or brown or red sputum. These may be signs of pneumonia.  You develop yellow or brown nasal discharge or pain in the face, especially when you bend forward. These may be signs of  sinusitis.  You develop a fever, swollen neck glands, pain with swallowing, or white areas in the back of your throat. These may be signs of strep throat. SEEK IMMEDIATE MEDICAL CARE IF:   You have a fever.  You develop severe or persistent headache, ear pain, sinus pain, or chest pain.  You develop wheezing, a prolonged cough, cough up blood, or have a change in your usual mucus (if you have chronic lung disease).  You develop sore muscles or a stiff neck. Document Released: 05/13/2001 Document Revised: 02/09/2012 Document Reviewed: 03/21/2011 [facility] Patient Information 2014 [place], [place].

## 2014-04-21 NOTE — ED Notes (Signed)
Nausea since this morning. Some diarrhea. Non productive cough since mother's day.  Takes zantac.

## 2014-04-21 NOTE — ED Provider Notes (Signed)
Patient presented to the ER with complaints of 3 weeks of progressively worsening productive cough. She is now experiencing nausea without vomiting and diarrhea. There is no abdominal pain.  Face to face Exam: HEENT - PERRLA Lungs - CTAB Heart - RRR, no M/R/G Abd - S/NT/ND Neuro - alert, oriented x3  Plan: Blood work unremarkable. Urinalysis consistent with urinary tract infection. Chest x-ray clear, no pneumonia. [employer] Coverage for bronchitis and UTI, followup with PCP.   Orpah Greek, MD 04/21/14 [phone]

## 2014-04-21 NOTE — ED Provider Notes (Signed)
Medical screening examination/treatment/procedure(s) were conducted as a shared visit with non-physician practitioner(s) and myself.  I personally evaluated the patient during the encounter.  Please see associated note for evaluation and plan.    EKG Interpretation None       Orpah Greek, MD 04/21/14 [phone]

## 2014-04-21 NOTE — ED Provider Notes (Signed)
CSN: [id number]     Arrival date & time 04/21/14  0272 History   None    Chief Complaint  Patient presents with  . Nausea  . URI     (Consider location/radiation/quality/duration/timing/severity/associated sxs/prior Treatment) HPI  Jamie [name] is a 80 y.o.female with a significant PMH of hypertension, peptic ulcer disease, low back pain, and shingles presents to the ER with complaints of cough, nausea, one episode of diarrhea and feeling weak for one day. Patient reports that she's had a cough over the past two weeks which she has not yet been seen for. She describes it as productive and clear sputum. She reports that she had felt fine other than that until this morning when she woke up feeling weak, nauseous and had one episode of nonbloody diarrhea. She also reports having a subjective fever. She denies having pain anywhere. In triage she is afebrile with normal blood pressure, oxygen saturation and pulse.   Past Medical History  Diagnosis Date  . ANEMIA, IRON DEFICIENCY 09/10/2007  . CATARACT EXTRACTION, HX OF 09/10/2007  . DYSPEPSIA, HX OF 09/10/2007  . HAIR LOSS 03/21/2009  . HAY FEVER 09/10/2007  . HEARING LOSS, SENSORINEURAL 09/10/2007  . HYPERLIPIDEMIA 09/10/2007  . HYPERTENSION 09/10/2007  . LOW BACK PAIN 05/10/2008  . Pain in joint, hand 03/01/2008  . PEPTIC ULCER DISEASE 09/10/2007  . POLYP, COLON 09/10/2007  . POLYPECTOMY, HX OF 09/10/2007  . SHINGLES 08/13/2009  . SHOULDER PAIN, LEFT 02/12/2008  . TAH/BSO, HX OF 09/10/2007   Past Surgical History  Procedure Laterality Date  . Polypectomy    . Cataract extraction    . Irrigation and debridement sebaceous cyst    . Total abdominal hysterectomy w/ bilateral salpingoophorectomy     Family History  Problem Relation Age of Onset  . Hypertension Mother   . Coronary artery disease Mother   . Heart disease Mother     CHF  . Cancer Neg Hx     breast or colon  . Diabetes Neg Hx    History  Substance Use Topics   . Smoking status: Never Smoker   . Smokeless tobacco: Not on file  . Alcohol Use: Not on file   OB History   Grav Para Term Preterm Abortions TAB SAB Ect Mult Living                 Review of Systems  Respiratory: Positive for cough. Negative for shortness of breath.   Cardiovascular: Negative for chest pain.  Gastrointestinal: Positive for nausea and diarrhea. Negative for vomiting.  Neurological: Positive for weakness.  All other systems reviewed and are negative.     Allergies  Review of patient's allergies indicates no known allergies.  Home Medications   Prior to Admission medications   Medication Sig Start Date End Date Taking? Authorizing Provider  acetaminophen (TYLENOL) 500 MG tablet Take 500 mg by mouth as needed.      Historical Provider, MD  ALPRAZolam Duanne Moron) 0.5 MG tablet TAKE ONE (1) TABLET THREE (3) TIMES EACH DAY AS NEEDED FOR SLEEP OR ANXIETY 04/11/13   Neena Rhymes, MD  alum & mag hydroxide-simeth [facility]) 200-200-20 MG/5ML suspension Take by mouth as needed.      Historical Provider, MD  atorvastatin (LIPITOR) 20 MG tablet Take 1 tablet (20 mg total) by mouth daily.    Neena Rhymes, MD  clotrimazole-betamethasone (LOTRISONE) cream APPLY TOPICALLY 3 TIMES A DAY 10/19/13   Neena Rhymes, MD  DIOVAN 320 MG  tablet TAKE ONE (1) TABLET EACH DAY 05/08/13   Neena Rhymes, MD  furosemide (LASIX) 40 MG tablet TAKE ONE (1) TABLET EACH DAY 03/16/13   Neena Rhymes, MD  meclizine (ANTIVERT) 25 MG tablet Take 25 mg by mouth every 6 (six) hours as needed.      Historical Provider, MD  methylcellulose (CITRUCEL) oral powder Take by mouth daily.      Historical Provider, MD  promethazine (PHENERGAN) 12.5 MG tablet TAKE 1 TABLET BY MOUTH EVERY 6 HOURS AS NEEDED FOR NAUSEA 09/20/13   Neena Rhymes, MD  ranitidine (ZANTAC) 150 MG tablet TAKE ONE TABLET TWICE DAILY 09/03/13   Neena Rhymes, MD   BP 131/63  Pulse 103  Temp(Src) 98.6 F (37 C) (Oral)  Resp  21  SpO2 98% Physical Exam  Nursing note and vitals reviewed. Constitutional: She appears well-developed and well-nourished. No distress.  HENT:  Head: Normocephalic and atraumatic.  Eyes: Pupils are equal, round, and reactive to light.  Neck: Normal range of motion. Neck supple.  Cardiovascular: Normal rate and regular rhythm.   Pulmonary/Chest: Effort normal. No respiratory distress. She has no wheezes. She has no rales.  Productive cough during exam. Good airway movement without wheezing or stridor.  Abdominal: Soft.  Neurological: She is alert.  Skin: Skin is warm and dry.    ED Course  Procedures (including critical care time) Labs Review Labs Reviewed  BASIC METABOLIC PANEL - Abnormal; Notable for the following:    Glucose, Bld 106 (*)    GFR calc non Af Amer 80 (*)    All other components within normal limits  CBC WITH DIFFERENTIAL - Abnormal; Notable for the following:    RBC 3.56 (*)    Hemoglobin 11.2 (*)    HCT 34.5 (*)    Neutrophils Relative % 83 (*)    Lymphocytes Relative 11 (*)    All other components within normal limits  URINALYSIS, ROUTINE W REFLEX MICROSCOPIC - Abnormal; Notable for the following:    APPearance CLOUDY (*)    Protein, ur 30 (*)    Leukocytes, UA MODERATE (*)    All other components within normal limits  URINE MICROSCOPIC-ADD ON - Abnormal; Notable for the following:    Squamous Epithelial / LPF FEW (*)    Bacteria, UA FEW (*)    All other components within normal limits  URINE CULTURE    Imaging Review Dg Chest 2 View  04/21/2014   CLINICAL DATA:  Cough and congestion  EXAM: CHEST  2 VIEW  COMPARISON:  DG CHEST 2 VIEW dated 03/01/2008; DG ABD ACUTE W/CHEST dated 09/12/2008  FINDINGS: The heart size and mediastinal contours are within normal limits. Both lungs are clear. The visualized skeletal structures are unremarkable. Nodularity over the left anterior fifth rib is stable since 2009 prior exam, compatible with a benign finding such as  costochondral calcification.  IMPRESSION: No active cardiopulmonary disease.   Electronically Signed   By: Conchita Paris M.D.   On: 04/21/2014 07:46     EKG Interpretation None      MDM   Final diagnoses:  UTI (lower urinary tract infection)  URI (upper respiratory infection)  Diarrhea    Dr. Betsey Holiday saw patient as well. She has  UTI and probably bronchitis (coughing for two weeks) and its getting worse.  He recommends Levaquin, Zofran, Tessalon, Albuterol Inhaler and then home. She is to follow-up with PCP.  She can take Imodium at home for diarrhea. She  otherwise does not look toxic or sick at this time. Her blood work is reassuring. She was given an albuterol inhaler in the ed as well as her first dose of Levaquin and Tessalon.   80 y.o.Tommy Rainwater Mcroy's evaluation in the Emergency Department is complete. It has been determined that no acute conditions requiring further emergency intervention are present at this time. The patient/guardian have been advised of the diagnosis and plan. We have discussed signs and symptoms that warrant return to the ED, such as changes or worsening in symptoms.  Vital signs are stable at discharge. Filed Vitals:   04/21/14 0700  BP: 131/63  Pulse: 103  Temp:   Resp: 21    Patient/guardian has voiced understanding and agreed to follow-up with the PCP or specialist.  I personally performed the services described in this documentation, which was scribed in my presence. The recorded information has been reviewed and is accurate.     Linus Mako, PA-C 04/21/14 [phone]

## 2014-04-22 LAB — URINE CULTURE

## 2014-04-25 ENCOUNTER — Other Ambulatory Visit: Payer: Self-pay

## 2014-04-25 MED ORDER — VALSARTAN 320 MG PO TABS: 320.0000 mg | ORAL_TABLET | Freq: Every day | ORAL | Status: DC

## 2014-04-25 MED ORDER — FUROSEMIDE 40 MG PO TABS: 40.0000 mg | ORAL_TABLET | Freq: Every day | ORAL | Status: DC

## 2014-04-28 ENCOUNTER — Ambulatory Visit (INDEPENDENT_AMBULATORY_CARE_PROVIDER_SITE_OTHER): Payer: Medicare PPO | Admitting: Internal Medicine

## 2014-04-28 ENCOUNTER — Encounter: Payer: Self-pay | Admitting: Internal Medicine

## 2014-04-28 VITALS — BP 132/50 | HR 81 | Temp 98.6°F | Resp 14 | Wt 157.1 lb

## 2014-04-28 DIAGNOSIS — M654 Radial styloid tenosynovitis [de Quervain]: Secondary | ICD-10-CM

## 2014-04-28 DIAGNOSIS — N898 Other specified noninflammatory disorders of vagina: Secondary | ICD-10-CM

## 2014-04-28 DIAGNOSIS — J209 Acute bronchitis, unspecified: Secondary | ICD-10-CM

## 2014-04-28 NOTE — Patient Instructions (Signed)
Use an anti-inflammatory cream such as Aspercreme or Zostrix cream twice a day to the affected area as needed. In lieu of this warm moist compresses or  hot water bottle can be used. Do not apply ice .  I recommend a consultation to determine optimal therapy with Dr Charlann Boxer [phone]) if wrist pain persists.

## 2014-04-28 NOTE — Progress Notes (Signed)
Pre visit review using our clinic review tool, if applicable. No additional management support is needed unless otherwise documented below in the visit note. 

## 2014-04-28 NOTE — Progress Notes (Signed)
   Subjective:    Patient ID: Jamie [name], female    DOB: 08-09-1933, 80 y.o.   MRN: [id number]  HPI Respiratory tract symptoms for which she was seen in emergency room 04/21/14 have resolved except for some clear, thick sputum. She denies any fever chills or sweats at this time she is on her last day of Levaquin. Chest x-ray revealed no active process. There was a nodule over the left anterior fifth rib which was unchanged versus 2009. This was felt to be a costochondral calcification.  Urinary tract infection was diagnosed based on moderate leukocytes on urinalysis even though she had no genitourinary symptoms. Final culture 5/22 revealed 20,000 colonies of multiple species indicating the absence of a true urinary tract infection.  At this time her major concern is the size of her ARB antihypertensive pill. She's questioning changing  back to the branded format. Her blood pressure ranges 118-126 over 70s at home.    Review of Systems  She denies frontal headache, facial pain, nasal purulence, or purulence sputum.  She denies dysuria, pyuria, hematuria, or flank pain.  She is having pain at the left medial wrist with mechanical activities. This has been present since March.     Objective:   Physical Exam  General appearance:good health ;well nourished; no acute distress or increased work of breathing is present.  No  lymphadenopathy about the head, neck, or axilla noted. Appears younger than stated age  Eyes: No conjunctival inflammation or lid edema is present. There is no scleral icterus.  Ears:  External ear exam shows no significant lesions or deformities.  Otoscopic examination reveals clear canals, tympanic membranes are intact bilaterally without bulging, retraction, inflammation or discharge.  Nose:  External nasal examination shows no deformity or inflammation. Nasal mucosa are pink and moist without lesions or exudates. No septal dislocation or deviation.No obstruction  to airflow.   Oral exam:She has an upper plate & lower partial;   lips and gums are healthy appearing.There is no oropharyngeal erythema or exudate noted.   Neck:  No deformities, thyromegaly, masses, or tenderness noted.   Supple with full range of motion without pain.   Heart:  Normal rate and regular rhythm. S1 and S2 normal without gallop, murmur, click, rub or other extra sounds.   Lungs:Chest clear to auscultation; no wheezes, rhonchi,rales ,or rubs present.No increased work of breathing.    Extremities:  No cyanosis, edema, or clubbing  noted .She has pain with range of motion of the left wrist.  No discomfort with percussion over the flanks  Skin: Warm & dry w/o jaundice or tenting.          Assessment & Plan:  #1 respiratory infection/bronchitis which is resolving  #2 no true urinary tract infection  #3 tenosynovitis left wrist  Plan: Because of the large size of the angiotensin receptor blocker ;I have asked her to cut the pill in half and take a half every 12 hours.  Topical treatment for the tenosynovitis. She should be seen by Dr. Tamala Julian, sports medicine if symptoms persist or progress.

## 2014-04-28 NOTE — Progress Notes (Signed)
Subjective:    Patient ID: Jamie [name], female    DOB: 09-26-1933, 80 y.o.   MRN: [id number]  HPI: hospital f/u Pt presented to [facility] ER Friday 04/21/14 for cough and cold symptoms. Per pt, she was subsequently found to have a UTI and was treated with Levaquin 500mg  daily x 7 days. The pt was given tessalon for the cough, which has improved. The pt is currently taking th tessalon 2x/day and reports the cough is no longer productive and is improving.Today is D7 of Levaquin. At this time the pt has no complaints. Pt does question the pharmacy refilling her diovan. She was given Valsartan, which she states is a larger pill than the diovan and would like to have it changed back to the diovan brand.   Review of Systems  Constitutional: Negative for fever, chills, fatigue and unexpected weight change.  Respiratory: Negative for chest tightness, shortness of breath and wheezing.   Cardiovascular: Negative for chest pain and palpitations.  Gastrointestinal: Negative for nausea, vomiting, abdominal pain, diarrhea and constipation.  Genitourinary: Negative for dysuria and difficulty urinating.  Neurological: Negative for dizziness and light-headedness.        Objective:   Physical Exam Gen.: Healthy and well-nourished in appearance. Alert, appropriate and cooperative throughout exam. Appears younger than stated age  Head: Normocephalic without obvious abnormalities.  Eyes: No corneal or conjunctival inflammation noted. Pupils equal round reactive to light and accommodation. Arcus senilis bilaterally. Extraocular motion intact. Pt wears glasses.  Nose: External nasal exam reveals no deformity or inflammation. Nasal mucosa are pink and moist. No lesions or exudates noted.   Mouth: Oral mucosa and oropharynx reveal no lesions or exudates. Upper partial.  Neck: No deformities, masses, or tenderness noted. Range of motion . No thyroidmegally.  Lungs: Normal respiratory effort; chest expands  symmetrically. Lungs are clear to auscultation without rales, wheezes, or increased work of breathing. Heart: Normal rate and rhythm. Normal S1 and S2. No gallop, click, or rub. No murmur. Abdomen: Bowel sounds normal; abdomen soft and nontender. No masses, organomegaly or hernias noted.                         Musculoskeletal/extremities: No deformity or scoliosis noted of  the thoracic or lumbar spine.  No clubbing, cyanosis, edema, or significant extremity  deformity noted. Range of motion normal .Tone & strength normal. Hand joints normal.  OR reveal mild  DJD DIP changes.  Able to lie down & sit up w/o help. Vascular: Carotid, radial artery, dorsalis pedis are full and equal. No bruits present. Neurologic: Alert and oriented x3.      Skin: Intact without suspicious lesions or rashes. Lymph: No cervical, axillary, lymphadenopathy present. Psych: Mood and affect are normal. Normally interactive                                                                                Assessment & Plan:  #1 Well patient visit; pt has no complaints at this time  Advised pt to discuss with insurance the Diovan to valsartan switch. At this recommend pt cut the valsartan in half and take BID. Will not consider halfing  valsartan dose as pt's BP is well controlled on current regimen. On recheck, BP was 130/70.

## 2014-05-04 ENCOUNTER — Telehealth: Payer: Self-pay | Admitting: *Deleted

## 2014-05-04 NOTE — Telephone Encounter (Signed)
Rf req for Alprazolam 0.5 mg 1 po tid prn sleep and anxiety. Last OV 01/2014 with [facility]. Scheduled to see Dr. Doug Sou 08/08/14. Ok to Rf?

## 2014-05-04 NOTE — Telephone Encounter (Signed)
OK to fill this prescription with additional refills x2 Thank you!  

## 2014-05-05 MED ORDER — ALPRAZOLAM 0.5 MG PO TABS: 0.5000 mg | ORAL_TABLET | Freq: Three times a day (TID) | ORAL | Status: DC | PRN

## 2014-05-05 NOTE — Telephone Encounter (Signed)
Done

## 2014-05-05 NOTE — Telephone Encounter (Signed)
Please call [facility].

## 2014-05-17 ENCOUNTER — Telehealth: Payer: Self-pay

## 2014-05-17 MED ORDER — VALSARTAN 320 MG PO TABS: 320.0000 mg | ORAL_TABLET | Freq: Every day | ORAL | Status: DC

## 2014-05-17 NOTE — Telephone Encounter (Deleted)
Pt needs a refill for diovan 320 mg

## 2014-05-17 NOTE — Telephone Encounter (Signed)
rx refilled for diovan 320 mg 1 tab po daily qty 30 0 rfs

## 2014-06-21 ENCOUNTER — Encounter: Payer: Self-pay | Admitting: Podiatry

## 2014-06-21 ENCOUNTER — Ambulatory Visit (INDEPENDENT_AMBULATORY_CARE_PROVIDER_SITE_OTHER): Payer: Medicare HMO | Admitting: Podiatry

## 2014-06-21 VITALS — BP 179/78 | HR 78 | Resp 17 | Ht 64.0 in | Wt <= 1120 oz

## 2014-06-21 DIAGNOSIS — Q828 Other specified congenital malformations of skin: Secondary | ICD-10-CM

## 2014-06-21 NOTE — Progress Notes (Signed)
   Subjective:    Patient ID: Jamie [name], female    DOB: Nov 11, 1933, 80 y.o.   MRN: [id number]  HPI Comments: N porokeratosis L left 2nd MPJ, heel, and 5th toe, right 1st toe and MPJ D and O long-term C hard, painful cores A weight bearing T OTC corn medication in bottle, and OTC medicated corn pads     Review of Systems  HENT: Positive for hearing loss and tinnitus.   Respiratory: Positive for cough.   Allergic/Immunologic: Positive for environmental allergies.  Neurological: Positive for numbness.  All other systems reviewed and are negative.      Objective:   Physical Exam  Orientated x3 black female  Vascular: DP and PT pulses 2/4 bilaterally  Neurological: Sensation to 10 g monofilament wire intact 5/5 bilaterally Vibratory sensation intact bilaterally Ankle Reflexes reactive bilaterally  Dermatological: Nucleated keratoses x3 plantar aspect left foot Non-nucleated keratoses x2 plantar aspect right  Musculoskeletal: HAV and hammer toes second, left foot Pes planus bilaterally      Assessment & Plan:   Assessment: Satisfactory neurovascular status Porokeratosis x3 Plantar keratoses x2  Plan: Debridement of porokeratosis and pack with salinocaine  Debride keratoses x2  Annex point at patient's request

## 2014-06-22 ENCOUNTER — Other Ambulatory Visit (INDEPENDENT_AMBULATORY_CARE_PROVIDER_SITE_OTHER): Payer: Medicare PPO

## 2014-06-22 ENCOUNTER — Encounter: Payer: Self-pay | Admitting: Family Medicine

## 2014-06-22 ENCOUNTER — Encounter: Payer: Self-pay | Admitting: Podiatry

## 2014-06-22 ENCOUNTER — Ambulatory Visit (INDEPENDENT_AMBULATORY_CARE_PROVIDER_SITE_OTHER): Payer: Medicare PPO | Admitting: Family Medicine

## 2014-06-22 VITALS — BP 154/76 | HR 87 | Ht 64.0 in | Wt 157.0 lb

## 2014-06-22 DIAGNOSIS — M19049 Primary osteoarthritis, unspecified hand: Secondary | ICD-10-CM

## 2014-06-22 DIAGNOSIS — M25532 Pain in left wrist: Secondary | ICD-10-CM

## 2014-06-22 DIAGNOSIS — M25539 Pain in unspecified wrist: Secondary | ICD-10-CM

## 2014-06-22 DIAGNOSIS — M180 Bilateral primary osteoarthritis of first carpometacarpal joints: Secondary | ICD-10-CM | POA: Insufficient documentation

## 2014-06-22 NOTE — Assessment & Plan Note (Signed)
Patient does have underlying osteophytic changes of the [facility] joint I think is giving her some pain. Patient does have some overlying de Quervain's tenosynovitis also. We are going to put patient in a brace today and then for the next 2 weeks to nightly for 2 weeks. We discussed an icing protocol as well as trying topical anti-inflammatories. Patient has had erosive gastritis previously and does to stop the topical medication if any stomach pain occurs. We discussed continued icing as well. Patient and will come back and see me again in 3-4 weeks.

## 2014-06-22 NOTE — Progress Notes (Signed)
  [name] [address] [place], [place] [address] Phone: [phone] Subjective:      CC: Jamie [name]  CLE:[id number] Jamie [name] is a 80 y.o. female coming in with complaint of right wrist pain. Patient has had this pain for several months. Patient does not remember any specific injury and states that it has slowly increased over time. Patient does not do any repetitive motions, does not have any hobbies and does not garden on a regular basis. Patient states it hurts more when she moves her thumb. Feels better when she rests. Denies any new medications. Denies any loss of strength or numbness. States severity is 5/10 pain. No nighttime awakening.      Past medical history, social, surgical and family history all reviewed in electronic medical record.   Review of Systems: No headache, visual changes, nausea, vomiting, diarrhea, constipation, dizziness, abdominal pain, skin rash, fevers, chills, night sweats, weight loss, swollen lymph nodes, body aches, joint swelling, muscle aches, chest pain, shortness of breath, mood changes.   Objective Blood pressure 154/76, pulse 87, height 5\' 4"  (1.626 m), weight 157 lb (71.215 kg), SpO2 98.00%.  General: No apparent distress alert and oriented x3 mood and affect normal, dressed appropriately.  HEENT: Pupils equal, extraocular movements intact  Respiratory: Patient's speak in full sentences and does not appear short of breath  Cardiovascular: No lower extremity edema, non tender, no erythema  Skin: Warm dry intact with no signs of infection or rash on extremities or on axial skeleton.  Abdomen: Soft nontender  Neuro: Cranial nerves II through XII are intact, neurovascularly intact in all extremities with 2+ DTRs and 2+ pulses.  Lymph: No lymphadenopathy of posterior or anterior cervical chain or axillae bilaterally.  Gait normal with good balance and coordination.  MSK:  Non tender with full range of motion  and good stability and symmetric strength and tone of shoulders, elbows, hip, knee and ankles bilaterally.  Wrist: Right Inspection normal with no visible erythema or swelling. ROM smooth and normal with good flexion and extension and ulnar/radial deviation that is symmetrical with opposite wrist. Palpation is normal over metacarpals, navicular, lunate, and TFCC; tendons without tenderness/ swelling Patient though is very tender to palpation over the [facility] joint. Positive pain with compression of the CMC joint. Positive grind test No snuffbox tenderness. No tenderness over Canal of Guyon. Strength 5/5 in all directions without pain. Negative Finkelstein, tinel's and phalens. Negative Watson's test.  MSK US performed of: Right This study was ordered, performed, and interpreted by Jamie [name] D.O.  Wrist: All extensor compartments visualized and tendons all normal in appearance without fraying, tears, or sheath effusions. Mild tendon sheath effusion of the abductor pollicis longus tendon. Heeney joint does have arthritic changes with hypoechoic changes. Mild potential effusion noted. TFCC intact. Scapholunate ligament intact. Carpal tunnel visualized and median nerve area normal, flexor tendons all normal in appearance without fraying, tears, or sheath effusions. Power doppler signal normal.  IMPRESSION:  CMC arthritis with mild tension of the abductor pollicis longus tendon..      Impression and Recommendations:     This case required medical decision making of moderate complexity.

## 2014-06-22 NOTE — Patient Instructions (Signed)
Very nice to meet you.  Wear brace day and night for next 2 weeks then nightly for 2 weeks.  Ice for 20 minutes after activity and before bed.  Heat before activity.  Try the Pennsaid twice daily for next 3 days then as needed.  If it hurts your stomach stop it immediatly.  Exercises to start in 1 week and do them 3 times a week.  Take tylenol 650 mg three times a day is the best evidence based medicine we have for arthritis.  Glucosamine sulfate 750mg  twice a day is a supplement that has been shown to help moderate to severe arthritis. Vitamin D 2000 IU daily Fish oil 2 grams daily.  Tumeric 500mg  twice daily.  Come back and see me in 3 weeks. If still in pain we can do an injection.

## 2014-07-03 ENCOUNTER — Other Ambulatory Visit: Payer: Self-pay | Admitting: Internal Medicine

## 2014-07-03 NOTE — Telephone Encounter (Signed)
Former Dr Linda Hedges patient  08/08/14 appt coming up with Dr Doug Sou Recent labs

## 2014-07-03 NOTE — Telephone Encounter (Signed)
OK X1 

## 2014-07-13 ENCOUNTER — Ambulatory Visit (INDEPENDENT_AMBULATORY_CARE_PROVIDER_SITE_OTHER): Payer: Medicare PPO | Admitting: Family Medicine

## 2014-07-13 ENCOUNTER — Encounter: Payer: Self-pay | Admitting: Family Medicine

## 2014-07-13 VITALS — BP 142/68 | HR 71 | Ht 64.0 in | Wt 161.0 lb

## 2014-07-13 DIAGNOSIS — M19049 Primary osteoarthritis, unspecified hand: Secondary | ICD-10-CM

## 2014-07-13 NOTE — Progress Notes (Signed)
  [name] [address], [place] [address] Phone: [phone] Subjective:      GLO:[id number] Jamie [name] is a 80 y.o. female coming in with complaint of right wrist pain. Patient was seen previously and did have severe right CMC arthritis. Patient was given bracing, icing protocol and we discussed with medications over-the-counter to be beneficial. Patient states that she is improved significantly. Patient states that only when she is opening of cancer she have any discomfort. Patient is able to do daily activities and sleeping comfortably at night. Patient states that she is approximately 70-80% better. Denies any new symptoms.     Past medical history, social, surgical and family history all reviewed in electronic medical record.   Review of Systems: No headache, visual changes, nausea, vomiting, diarrhea, constipation, dizziness, abdominal pain, skin rash, fevers, chills, night sweats, weight loss, swollen lymph nodes, body aches, joint swelling, muscle aches, chest pain, shortness of breath, mood changes.   Objective Blood pressure 142/68, pulse 71, height 5\' 4"  (1.626 m), weight 161 lb (73.029 kg), SpO2 97.00%.  General: No apparent distress alert and oriented x3 mood and affect normal, dressed appropriately.  HEENT: Pupils equal, extraocular movements intact  Respiratory: Patient's speak in full sentences and does not appear short of breath  Cardiovascular: No lower extremity edema, non tender, no erythema  Skin: Warm dry intact with no signs of infection or rash on extremities or on axial skeleton.  Abdomen: Soft nontender  Neuro: Cranial nerves II through XII are intact, neurovascularly intact in all extremities with 2+ DTRs and 2+ pulses.  Lymph: No lymphadenopathy of posterior or anterior cervical chain or axillae bilaterally.  Gait normal with good balance and coordination.  MSK:  Non tender with full range of motion and good  stability and symmetric strength and tone of shoulders, elbows, hip, knee and ankles bilaterally.  Wrist: Right Inspection normal with no visible erythema or swelling. Patient does have some atrophy of the thenar eminence. ROM smooth and normal with good flexion and extension and ulnar/radial deviation that is symmetrical with opposite wrist. Palpation is normal over metacarpals, navicular, lunate, and TFCC; tendons without tenderness/ swelling Patient though is minimally tender to palpation over the [facility] joint. Positive pain with compression of the CMC joint. Positive grind test No snuffbox tenderness. No tenderness over Canal of Guyon. Strength 5/5 in all directions without pain. Negative Finkelstein, tinel's and phalens. Negative Watson's test.       Impression and Recommendations:     This case required medical decision making of moderate complexity.

## 2014-07-13 NOTE — Patient Instructions (Signed)
You are doing great! COntinue the brace at night another week or 2.  Ice is your friend.  Consider a can opener.  Tylenol 3 times daily can help Continue the vitamin D  See me when you need me./

## 2014-07-13 NOTE — Assessment & Plan Note (Addendum)
Patient is doing significantly better with over-the-counter medications as well as bracing and icing protocol. Patient encouraged to continue this on a regular basis. We discussed other over-the-counter tools the can help her with can opening. His lungs patient continues to do well she can follow up with me on an as-needed basis. We did not get any further imaging secondary to do not changing our management. Patient is happy at this time. Discussed imaging which showed patient does have a scaphoid fracture but at this time this would not change management.  Spent greater than 25 minutes with patient face-to-face and had greater than 50% of counseling including as described above in assessment and plan.

## 2014-07-21 ENCOUNTER — Other Ambulatory Visit: Payer: Self-pay | Admitting: *Deleted

## 2014-07-21 MED ORDER — FUROSEMIDE 40 MG PO TABS: 40.0000 mg | ORAL_TABLET | Freq: Every day | ORAL | Status: DC

## 2014-07-25 ENCOUNTER — Other Ambulatory Visit: Payer: Self-pay

## 2014-07-25 MED ORDER — FUROSEMIDE 40 MG PO TABS
40.0000 mg | ORAL_TABLET | Freq: Every day | ORAL | Status: DC
Start: 2014-07-25 — End: 2014-12-22

## 2014-08-08 ENCOUNTER — Encounter: Payer: Self-pay | Admitting: Internal Medicine

## 2014-08-08 ENCOUNTER — Ambulatory Visit (INDEPENDENT_AMBULATORY_CARE_PROVIDER_SITE_OTHER): Payer: Medicare PPO | Admitting: Internal Medicine

## 2014-08-08 VITALS — BP 122/72 | HR 80 | Temp 98.2°F | Resp 16 | Ht 62.0 in | Wt 163.6 lb

## 2014-08-08 DIAGNOSIS — I10 Essential (primary) hypertension: Secondary | ICD-10-CM

## 2014-08-08 DIAGNOSIS — E785 Hyperlipidemia, unspecified: Secondary | ICD-10-CM

## 2014-08-08 DIAGNOSIS — Z0000 Encounter for general adult medical examination without abnormal findings: Secondary | ICD-10-CM

## 2014-08-08 DIAGNOSIS — Z23 Encounter for immunization: Secondary | ICD-10-CM

## 2014-08-08 DIAGNOSIS — L659 Nonscarring hair loss, unspecified: Secondary | ICD-10-CM

## 2014-08-08 MED ORDER — ATORVASTATIN CALCIUM 20 MG PO TABS: 20.0000 mg | ORAL_TABLET | Freq: Every day | ORAL | Status: DC

## 2014-08-08 NOTE — Progress Notes (Signed)
Pre visit review using our clinic review tool, if applicable. No additional management support is needed unless otherwise documented below in the visit note. 

## 2014-08-08 NOTE — Progress Notes (Signed)
   Subjective:    Patient ID: Jamie [name], female    DOB: [birth year], 80 y.o.   MRN: [id number]  HPI The patient is a 80 YO woman who is coming in to establish care. She has PMH of hypertension, shingles, arthritis, hyperlipidemia. She is doing well overall but is concerned as her hair has thinned over the last several years. She has not noticed any rash or pain. She is not having any problems with her medicines and her arthritis is doing better since she has started seeing sports medicine. She denies any chest pains or SOB. She denies abdominal pain, diarrhea, constipation. She denies balance problems or recent falls. She is having minimal allergy symptoms and wants to know if she can still get her flu shot. She is having some clear drainage with inconsistent dry cough. She denies fevers or chills or sore throat. She denies headache or sinus pain.   Review of Systems  Constitutional: Negative for activity change, appetite change and unexpected weight change.  HENT: Positive for congestion and rhinorrhea. Negative for postnasal drip, sinus pressure, sneezing and sore throat.   Respiratory: Positive for cough. Negative for chest tightness, shortness of breath and wheezing.   Cardiovascular: Negative for chest pain, palpitations and leg swelling.  Gastrointestinal: Negative for abdominal pain, diarrhea, constipation and abdominal distention.  Musculoskeletal: Positive for arthralgias. Negative for back pain and gait problem.  Neurological: Negative for dizziness, weakness, light-headedness and headaches.       Objective:   Physical Exam  Constitutional: She is oriented to person, place, and time. She appears well-developed and well-nourished. No distress.  HENT:  Head: Normocephalic and atraumatic.  Nose: Nose normal.  Mouth/Throat: Oropharynx is clear and moist.  Eyes: EOM are normal.  Neck: Normal range of motion. Neck supple. No JVD present. No thyromegaly present.  Cardiovascular:  Normal rate and regular rhythm.   Mild systolic ejection murmur at left sternal border.   Pulmonary/Chest: Effort normal and breath sounds normal.  Abdominal: Soft. Bowel sounds are normal. She exhibits no distension. There is no tenderness. There is no rebound.  Musculoskeletal: Normal range of motion. She exhibits no edema and no tenderness.  Neurological: She is alert and oriented to person, place, and time. No cranial nerve deficit.  Skin: Skin is warm and dry. She is not diaphoretic.    Filed Vitals:   08/08/14 0948 08/08/14 1000  BP: 158/64 122/72  Pulse: 80   Temp: 98.2 F (36.8 C)   TempSrc: Oral   Resp: 16   Height: 5\' 2"  (1.575 m)   Weight: 163 lb 9.6 oz (74.208 kg)   SpO2: 96%       Assessment & Plan:   Patient given flu shot at today's visit.

## 2014-08-08 NOTE — Assessment & Plan Note (Signed)
Patient advised that this is likely a normal part of aging and that she can go see dermatologist if she is interested in pursing it.

## 2014-08-08 NOTE — Assessment & Plan Note (Signed)
Filed Vitals:   08/08/14 0948 08/08/14 1000  BP: 158/64 122/72  Pulse: 80   Temp: 98.2 F (36.8 C)   TempSrc: Oral   Resp: 16   Height: 5\' 2"  (1.575 m)   Weight: 163 lb 9.6 oz (74.208 kg)   SpO2: 96%    BP well controlled with rest and will continue her diovan. She would like to go back to name brand as the generic is a larger pill.

## 2014-08-08 NOTE — Patient Instructions (Signed)
We will have you call a dermatologist to check on your hair. We are not going to change your medicines today. Your blood pressure is fine today.   We will check your blood work at the next visit in about 6 months. If you are feeling sick you are come back sooner.   Exercise to Stay Healthy Exercise helps you become and stay healthy. EXERCISE IDEAS AND TIPS Choose exercises that:  You enjoy.  Fit into your day. You do not need to exercise really hard to be healthy. You can do exercises at a slow or medium level and stay healthy. You can:  Stretch before and after working out.  Try yoga, Pilates, or tai chi.  Lift weights.  Walk fast, swim, jog, run, climb stairs, bicycle, dance, or rollerskate.  Take aerobic classes. Exercises that burn about 150 calories:  Running 1  miles in 15 minutes.  Playing volleyball for 45 to 60 minutes.  Washing and waxing a car for 45 to 60 minutes.  Playing touch football for 45 minutes.  Walking 1  miles in 35 minutes.  Pushing a stroller 1  miles in 30 minutes.  Playing basketball for 30 minutes.  Raking leaves for 30 minutes.  Bicycling 5 miles in 30 minutes.  Walking 2 miles in 30 minutes.  Dancing for 30 minutes.  Shoveling snow for 15 minutes.  Swimming laps for 20 minutes.  Walking up stairs for 15 minutes.  Bicycling 4 miles in 15 minutes.  Gardening for 30 to 45 minutes.  Jumping rope for 15 minutes.  Washing windows or floors for 45 to 60 minutes. Document Released: 12/20/2010 Document Revised: 02/09/2012 Document Reviewed: 12/20/2010 [facility] Patient Information 2015 [place], [place]. This information is not intended to replace advice given to you by your health care provider. Make sure you discuss any questions you have with your health care provider.

## 2014-08-08 NOTE — Assessment & Plan Note (Signed)
Patient continues on lipitor. Last LDL 86 and can consider recheck in several years.

## 2014-08-08 NOTE — Assessment & Plan Note (Signed)
Flu shot given today

## 2014-09-08 ENCOUNTER — Ambulatory Visit (INDEPENDENT_AMBULATORY_CARE_PROVIDER_SITE_OTHER): Payer: Medicare PPO | Admitting: Family

## 2014-09-08 ENCOUNTER — Ambulatory Visit (INDEPENDENT_AMBULATORY_CARE_PROVIDER_SITE_OTHER)
Admission: RE | Admit: 2014-09-08 | Discharge: 2014-09-08 | Disposition: A | Discharge status: Home / Self Care | Payer: Medicare PPO | Source: Ambulatory Visit | Attending: Family | Admitting: Family

## 2014-09-08 ENCOUNTER — Telehealth: Payer: Self-pay

## 2014-09-08 ENCOUNTER — Ambulatory Visit: Payer: Medicare PPO | Admitting: Family

## 2014-09-08 ENCOUNTER — Encounter: Payer: Self-pay | Admitting: Family

## 2014-09-08 VITALS — BP 160/74 | HR 88 | Temp 98.6°F | Resp 18 | Wt 165.0 lb

## 2014-09-08 DIAGNOSIS — J209 Acute bronchitis, unspecified: Secondary | ICD-10-CM

## 2014-09-08 DIAGNOSIS — H6123 Impacted cerumen, bilateral: Secondary | ICD-10-CM

## 2014-09-08 DIAGNOSIS — H6120 Impacted cerumen, unspecified ear: Secondary | ICD-10-CM | POA: Insufficient documentation

## 2014-09-08 NOTE — Patient Instructions (Signed)
Thank you for choosing [facility].  Summary/Instructions:   Please go down to radiology for a chest x-ray   Continue supportive care with OTC medications as needed. May use Tylenol for headaches/fever if develops. Continue to you Delysm or Robustussin as needed for cough. May use Mucinex to break up congestion. Please return to the office if symptoms worsen or fail to improve.

## 2014-09-08 NOTE — Telephone Encounter (Signed)
Informed pt of her xray results.

## 2014-09-08 NOTE — Progress Notes (Signed)
Pre visit review using our clinic review tool, if applicable. No additional management support is needed unless otherwise documented below in the visit note. 

## 2014-09-08 NOTE — Telephone Encounter (Deleted)
Message copied by Lorrin Jackson on Fri Sep 08, 2014  1:42 PM ------      Message from: Mauricio Po D      Created: Fri Sep 08, 2014 12:51 PM       Please call patient and inform her her chest x-ray shows bronchitis and no pneumonia. Thus, please continue the supportive care and over the counter medications as we discussed. ------

## 2014-09-08 NOTE — Assessment & Plan Note (Signed)
Attempted to clean ears - mild irritation of right ear following. Left ear cleaned with ear pick. Advised to flush with warm water at home or use OTC ear care kits.

## 2014-09-08 NOTE — Progress Notes (Signed)
   Subjective:    Patient ID: Jamie [name], female    DOB: 07-02-1933, 80 y.o.   MRN: [id number]  HPI:  Jamie [name] is a 80 y.o. female who presents today for chest congestion Been going on for 2 about weeks, and it has stayed the same. Has some mucus production described as light green. Denies nasal congestion, sore throat, fever, chills or body aches  Has tried Robutussin which has helped a little.    No Known Allergies   Current Outpatient Prescriptions on File Prior to Visit  Medication Sig Dispense Refill  . acetaminophen (TYLENOL) 500 MG tablet Take 1,000 mg by mouth every 6 (six) hours as needed for mild pain.       Marland Kitchen ALPRAZolam (XANAX) 0.5 MG tablet Take 1 tablet (0.5 mg total) by mouth 3 (three) times daily as needed for anxiety.  90 tablet  2  . alum & mag hydroxide-simeth (MYLANTA) 546-568-12 MG/5ML suspension Take 30 mLs by mouth daily as needed for indigestion or heartburn.       Marland Kitchen atorvastatin (LIPITOR) 20 MG tablet Take 1 tablet (20 mg total) by mouth daily.  90 tablet  3  . clotrimazole-betamethasone (LOTRISONE) cream Apply 1 application topically 2 (two) times daily as needed (rash).      . DIOVAN 320 MG tablet TAKE 1 TABLET BY MOUTH DAILY  90 tablet  0  . furosemide (LASIX) 40 MG tablet Take 1 tablet (40 mg total) by mouth daily.  90 tablet  0  . methylcellulose (CITRUCEL) oral powder Take 1 packet by mouth daily as needed (constipation).       . ranitidine (ZANTAC) 150 MG tablet Take 150 mg by mouth daily.       No current facility-administered medications on file prior to visit.     Review of Systems  See HPI    Objective:     BP 160/74  Pulse 88  Temp(Src) 98.6 F (37 C) (Oral)  Resp 18  Wt 165 lb (74.844 kg)  SpO2 95% Nursing note and vital signs reviewed.  Physical Exam  Constitutional: She is oriented to person, place, and time. She appears well-developed and well-nourished.  HENT:  Head: Normocephalic.  Right Ear: External ear  normal.  Left Ear: External ear normal.  Nose: Nose normal.  Mouth/Throat: Oropharynx is clear and moist. No oropharyngeal exudate.  Cerumen impaction noted bilaterally  Neck: Neck supple.  Cardiovascular: Normal rate, regular rhythm and normal heart sounds.   Pulmonary/Chest: Effort normal.  Mild rales noted Right lower lobe  Neurological: She is alert and oriented to person, place, and time.  Skin: Skin is warm and dry.  Psychiatric: She has a normal mood and affect. Her behavior is normal. Judgment and thought content normal.        Assessment & Plan:

## 2014-09-08 NOTE — Assessment & Plan Note (Addendum)
Obtain chest x-ray r/o pneumonia. Continue supportive care with OTC medications as needed. May use Tylenol for headaches/fever if develops. Continue to you Delysm or Robustussin as needed for cough. May use Mucinex to break up congestion. Please return to the office if symptoms worsen or fail to improve.   X-ray consistent with bronchitis with no evidence of PNA

## 2014-09-29 ENCOUNTER — Other Ambulatory Visit: Payer: Self-pay | Admitting: Geriatric Medicine

## 2014-09-29 MED ORDER — CLOTRIMAZOLE-BETAMETHASONE 1-0.05 % EX CREA: 1.0000 "application " | TOPICAL_CREAM | Freq: Two times a day (BID) | CUTANEOUS | Status: DC | PRN

## 2014-11-06 ENCOUNTER — Encounter: Payer: Self-pay | Admitting: Family

## 2014-11-06 ENCOUNTER — Ambulatory Visit (INDEPENDENT_AMBULATORY_CARE_PROVIDER_SITE_OTHER): Payer: Medicare PPO | Admitting: Family

## 2014-11-06 ENCOUNTER — Other Ambulatory Visit: Payer: Medicare PPO

## 2014-11-06 VITALS — BP 160/60 | HR 76 | Temp 98.2°F | Resp 18 | Ht 63.0 in | Wt 164.8 lb

## 2014-11-06 DIAGNOSIS — R350 Frequency of micturition: Secondary | ICD-10-CM | POA: Insufficient documentation

## 2014-11-06 LAB — POCT URINALYSIS DIPSTICK
Bilirubin, UA: NEGATIVE
Blood, UA: NEGATIVE
Glucose, UA: NEGATIVE
Ketones, UA: NEGATIVE
NITRITE UA: NEGATIVE
PH UA: 6
PROTEIN UA: NEGATIVE
SPEC GRAV UA: 1.02
UROBILINOGEN UA: NEGATIVE

## 2014-11-06 MED ORDER — SULFAMETHOXAZOLE-TRIMETHOPRIM 800-160 MG PO TABS: 1.0000 | ORAL_TABLET | Freq: Two times a day (BID) | ORAL | Status: DC

## 2014-11-06 NOTE — Patient Instructions (Signed)
Thank you for choosing [facility].  Summary/Instructions:  Your prescription(s) have been submitted to your pharmacy. Please take as directed and contact our office if you believe you are having problem(s) with the medication(s).  If your symptoms worsen or fail to improve, please contact our office for further instruction, or in case of emergency go directly to the emergency room at the closest medical facility.   Urinary Tract Infection Urinary tract infections (UTIs) can develop anywhere along your urinary tract. Your urinary tract is your body's drainage system for removing wastes and extra water. Your urinary tract includes two kidneys, two ureters, a bladder, and a urethra. Your kidneys are a pair of bean-shaped organs. Each kidney is about the size of your fist. They are located below your ribs, one on each side of your spine. CAUSES Infections are caused by microbes, which are microscopic organisms, including fungi, viruses, and bacteria. These organisms are so small that they can only be seen through a microscope. Bacteria are the microbes that most commonly cause UTIs. SYMPTOMS  Symptoms of UTIs may vary by age and gender of the patient and by the location of the infection. Symptoms in young women typically include a frequent and intense urge to urinate and a painful, burning feeling in the bladder or urethra during urination. Older women and men are more likely to be tired, shaky, and weak and have muscle aches and abdominal pain. A fever may mean the infection is in your kidneys. Other symptoms of a kidney infection include pain in your back or sides below the ribs, nausea, and vomiting. DIAGNOSIS To diagnose a UTI, your caregiver will ask you about your symptoms. Your caregiver also will ask to provide a urine sample. The urine sample will be tested for bacteria and white blood cells. White blood cells are made by your body to help fight infection. TREATMENT  Typically, UTIs can  be treated with medication. Because most UTIs are caused by a bacterial infection, they usually can be treated with the use of antibiotics. The choice of antibiotic and length of treatment depend on your symptoms and the type of bacteria causing your infection. HOME CARE INSTRUCTIONS  If you were prescribed antibiotics, take them exactly as your caregiver instructs you. Finish the medication even if you feel better after you have only taken some of the medication.  Drink enough water and fluids to keep your urine clear or pale yellow.  Avoid caffeine, tea, and carbonated beverages. They tend to irritate your bladder.  Empty your bladder often. Avoid holding urine for long periods of time.  Empty your bladder before and after sexual intercourse.  After a bowel movement, women should cleanse from front to back. Use each tissue only once. SEEK MEDICAL CARE IF:   You have back pain.  You develop a fever.  Your symptoms do not begin to resolve within 3 days. SEEK IMMEDIATE MEDICAL CARE IF:   You have severe back pain or lower abdominal pain.  You develop chills.  You have nausea or vomiting.  You have continued burning or discomfort with urination. MAKE SURE YOU:   Understand these instructions.  Will watch your condition.  Will get help right away if you are not doing well or get worse. Document Released: 08/27/2005 Document Revised: 05/18/2012 Document Reviewed: 12/26/2011 ExitCare Patient Information 2015 [employer]. This information is not intended to replace advice given to you by your health care provider. Make sure you discuss any questions you have with your health   care provider.   

## 2014-11-06 NOTE — Assessment & Plan Note (Addendum)
Abdomen exam is benign. Symptoms and exam consistent with potential urinary tract infection. Start Bactrim. Discussed urinary tract infection care. Follow up if symptoms worsen or fail to improve.

## 2014-11-06 NOTE — Progress Notes (Signed)
Pre visit review using our clinic review tool, if applicable. No additional management support is needed unless otherwise documented below in the visit note. 

## 2014-11-06 NOTE — Progress Notes (Signed)
   Subjective:    Patient ID: Jamie [name], female    DOB: [birth year], 80 y.o.   MRN: [id number]  Chief Complaint  Patient presents with  . Abdominal Pain    lower abdominal pain x2 weeks, did have discomfort with urination last night    HPI:  Jamie [name] is a 80 y.o. female who presents today for an acute visit.   Acute symptoms of lower abdominal pain started about 2 weeks ago and feels like a squeezing around the hypogastric quadrant. Has waxed and waned. Has tried tylenol and extra strength [facility] pain pill and has drank some cranberry juice.. A little more frequency and urgency than normal. Little bit of low back pain, no fevers. Denies changes in bowel habits.  No Known Allergies  Current Outpatient Prescriptions on File Prior to Visit  Medication Sig Dispense Refill  . acetaminophen (TYLENOL) 500 MG tablet Take 1,000 mg by mouth every 6 (six) hours as needed for mild pain.     Marland Kitchen ALPRAZolam (XANAX) 0.5 MG tablet Take 1 tablet (0.5 mg total) by mouth 3 (three) times daily as needed for anxiety. 90 tablet 2  . alum & mag hydroxide-simeth (MYLANTA) 409-735-32 MG/5ML suspension Take 30 mLs by mouth daily as needed for indigestion or heartburn.     Marland Kitchen atorvastatin (LIPITOR) 20 MG tablet Take 1 tablet (20 mg total) by mouth daily. 90 tablet 3  . clotrimazole-betamethasone (LOTRISONE) cream Apply 1 application topically 2 (two) times daily as needed (rash). 30 g 3  . DIOVAN 320 MG tablet TAKE 1 TABLET BY MOUTH DAILY 90 tablet 0  . furosemide (LASIX) 40 MG tablet Take 1 tablet (40 mg total) by mouth daily. 90 tablet 0  . methylcellulose (CITRUCEL) oral powder Take 1 packet by mouth daily as needed (constipation).     . ranitidine (ZANTAC) 150 MG tablet Take 150 mg by mouth daily.     No current facility-administered medications on file prior to visit.   Review of Systems    See HPI  Objective:    BP 160/60 mmHg  Pulse 76  Temp(Src) 98.2 F (36.8 C) (Oral)  Resp 18   Ht 5\' 3"  (1.6 m)  Wt 164 lb 12.8 oz (74.753 kg)  BMI 29.20 kg/m2  SpO2 97% Nursing note and vital signs reviewed.  Physical Exam  Constitutional: She is oriented to person, place, and time. She appears well-developed and well-nourished. No distress.  Cardiovascular: Normal rate, regular rhythm, normal heart sounds and intact distal pulses.   Pulmonary/Chest: Effort normal and breath sounds normal.  Abdominal: Normal appearance and bowel sounds are normal. There is no hepatosplenomegaly. There is no tenderness. There is no CVA tenderness.  Unable to reproduce squeezing feeling in hypogastric area.  Neurological: She is alert and oriented to person, place, and time.  Skin: Skin is warm and dry.  Psychiatric: She has a normal mood and affect. Her behavior is normal. Judgment and thought content normal.       Assessment & Plan:

## 2014-11-07 ENCOUNTER — Telehealth: Payer: Self-pay | Admitting: Internal Medicine

## 2014-11-07 MED ORDER — CIPROFLOXACIN HCL 250 MG PO TABS: 250.0000 mg | ORAL_TABLET | Freq: Two times a day (BID) | ORAL | Status: DC

## 2014-11-07 NOTE — Telephone Encounter (Signed)
Pt aware.

## 2014-11-07 NOTE — Telephone Encounter (Signed)
Pt called stated that she was in for a uti and Marya Amsler gave her medication. Pt took one pill last night and she started to itch on her inner thigh and big bump came up. Pt stated that it went away this morning but she was wondering if this a allergic reaction? What does Marya Amsler wants to do.

## 2014-11-07 NOTE — Telephone Encounter (Signed)
To be on the safe side, I have sent a new antibiotic to her pharmacy.

## 2014-11-08 LAB — URINE CULTURE
COLONY COUNT: NO GROWTH
Organism ID, Bacteria: NO GROWTH

## 2014-11-28 ENCOUNTER — Other Ambulatory Visit: Payer: Self-pay | Admitting: Geriatric Medicine

## 2014-11-28 ENCOUNTER — Telehealth: Payer: Self-pay | Admitting: Internal Medicine

## 2014-11-28 ENCOUNTER — Telehealth: Payer: Self-pay | Admitting: Geriatric Medicine

## 2014-11-28 NOTE — Telephone Encounter (Signed)
Jamie [name], she was ok with coming in tomorrow to be seen.  I had to put her in to see Greg at 4pm.  She said that she just woke up this morning feeling sick on her stomach.  She said she just has a nervous stomach.

## 2014-11-28 NOTE — Telephone Encounter (Signed)
Patient is coming in to be seen tomorrow.

## 2014-11-28 NOTE — Telephone Encounter (Signed)
Patient is requesting a call back.  She is requesting a script refill on a nausea med.

## 2014-11-29 ENCOUNTER — Ambulatory Visit (INDEPENDENT_AMBULATORY_CARE_PROVIDER_SITE_OTHER): Payer: Medicare PPO | Admitting: Family

## 2014-11-29 ENCOUNTER — Encounter: Payer: Self-pay | Admitting: Family

## 2014-11-29 VITALS — BP 142/68 | HR 78 | Temp 98.6°F | Resp 18 | Wt 162.0 lb

## 2014-11-29 DIAGNOSIS — R142 Eructation: Secondary | ICD-10-CM

## 2014-11-29 MED ORDER — ALPRAZOLAM 0.5 MG PO TABS: 0.5000 mg | ORAL_TABLET | Freq: Three times a day (TID) | ORAL | Status: DC | PRN

## 2014-11-29 MED ORDER — PROMETHAZINE HCL 12.5 MG PO TABS: 12.5000 mg | ORAL_TABLET | Freq: Three times a day (TID) | ORAL | Status: DC | PRN

## 2014-11-29 NOTE — Assessment & Plan Note (Signed)
Symptoms and exam consistent with excessive gas production cannot rule out GERD as an underlying cause. Increase ranitidine from 150 mg daily to 150 mg twice a day. Also discussed decreasing gas producing foods. Follow up if symptoms worsen or fail to improve.

## 2014-11-29 NOTE — Patient Instructions (Signed)
Thank you for choosing [facility].  Summary/Instructions:  Your prescription(s) have been submitted to your pharmacy. Please take as directed and contact our office if you believe you are having problem(s) with the medication(s).  If your symptoms worsen or fail to improve, please contact our office for further instruction, or in case of emergency go directly to the emergency room at the closest medical facility.

## 2014-11-29 NOTE — Progress Notes (Signed)
   Subjective:    Patient ID: Jamie [name], female    DOB: Apr 14, 1933, 80 y.o.   MRN: [id number]  Chief Complaint  Patient presents with  . Gas    says she has gas, feels really full, when taking ranitidine it does feel better, x3 days     HPI:      Jamie [name] is a 80 y.o. female who presents today for an acute visit.   Acute symptoms of increased belching started up again following the Christmas holiday. Currently taking ranitidine and Mylanta which help to decrease her symptoms. No abdominal pain. May have some refllux following eating tomato sauce. Denies changes in bowel or bladder habits.   No Known Allergies  Current Outpatient Prescriptions on File Prior to Visit  Medication Sig Dispense Refill  . acetaminophen (TYLENOL) 500 MG tablet Take 1,000 mg by mouth every 6 (six) hours as needed for mild pain.     Marland Kitchen alum & mag hydroxide-simeth (MYLANTA) 200-200-20 MG/5ML suspension Take 30 mLs by mouth daily as needed for indigestion or heartburn.     Marland Kitchen atorvastatin (LIPITOR) 20 MG tablet Take 1 tablet (20 mg total) by mouth daily. 90 tablet 3  . cholecalciferol (VITAMIN D) 400 UNITS TABS tablet Take 2,000 Units by mouth.    . clotrimazole-betamethasone (LOTRISONE) cream Apply 1 application topically 2 (two) times daily as needed (rash). 30 g 3  . DIOVAN 320 MG tablet TAKE 1 TABLET BY MOUTH DAILY 90 tablet 0  . furosemide (LASIX) 40 MG tablet Take 1 tablet (40 mg total) by mouth daily. 90 tablet 0  . methylcellulose (CITRUCEL) oral powder Take 1 packet by mouth daily as needed (constipation).     . ranitidine (ZANTAC) 150 MG tablet Take 150 mg by mouth 2 (two) times daily.    Marland Kitchen sulfamethoxazole-trimethoprim (SEPTRA DS) 800-160 MG per tablet Take 1 tablet by mouth 2 (two) times daily. 6 tablet 0   No current facility-administered medications on file prior to visit.    Review of Systems  Constitutional: Negative for fever.  Gastrointestinal: Negative for nausea,  vomiting, abdominal pain and constipation.      Objective:    BP 142/68 mmHg  Pulse 78  Temp(Src) 98.6 F (37 C) (Oral)  Resp 18  Wt 162 lb (73.483 kg)  SpO2 97% Nursing note and vital signs reviewed.  Physical Exam  Constitutional: She is oriented to person, place, and time. She appears well-developed and well-nourished. No distress.  Cardiovascular: Normal rate, regular rhythm, normal heart sounds and intact distal pulses.   Pulmonary/Chest: Effort normal and breath sounds normal.  Abdominal: Soft. Bowel sounds are normal. She exhibits no distension and no mass. There is no tenderness. There is no rebound and no guarding.  Neurological: She is alert and oriented to person, place, and time.  Skin: Skin is warm and dry.  Psychiatric: She has a normal mood and affect. Her behavior is normal. Judgment and thought content normal.       Assessment & Plan:

## 2014-11-29 NOTE — Progress Notes (Signed)
Pre visit review using our clinic review tool, if applicable. No additional management support is needed unless otherwise documented below in the visit note. 

## 2014-12-22 ENCOUNTER — Other Ambulatory Visit: Payer: Self-pay | Admitting: Internal Medicine

## 2014-12-31 ENCOUNTER — Other Ambulatory Visit: Payer: Self-pay | Admitting: Internal Medicine

## 2015-01-08 ENCOUNTER — Other Ambulatory Visit: Payer: Self-pay | Admitting: Internal Medicine

## 2015-01-20 ENCOUNTER — Ambulatory Visit (INDEPENDENT_AMBULATORY_CARE_PROVIDER_SITE_OTHER): Payer: Medicare PPO | Admitting: Family

## 2015-01-20 ENCOUNTER — Encounter: Payer: Self-pay | Admitting: Family

## 2015-01-20 ENCOUNTER — Other Ambulatory Visit: Payer: Self-pay | Admitting: Family

## 2015-01-20 VITALS — BP 144/80 | HR 71 | Temp 97.6°F | Ht 63.0 in | Wt 162.5 lb

## 2015-01-20 DIAGNOSIS — R1032 Left lower quadrant pain: Secondary | ICD-10-CM

## 2015-01-20 LAB — POCT URINALYSIS DIPSTICK
BILIRUBIN UA: NEGATIVE
GLUCOSE UA: NEGATIVE
KETONES UA: 1.005
Leukocytes, UA: NEGATIVE
Nitrite, UA: NEGATIVE
PH UA: 7
Protein, UA: 15
RBC UA: NEGATIVE
Spec Grav, UA: <=1.005
UROBILINOGEN UA: 0.2

## 2015-01-20 MED ORDER — CIPROFLOXACIN HCL 500 MG PO TABS: 500.0000 mg | ORAL_TABLET | Freq: Two times a day (BID) | ORAL | Status: DC

## 2015-01-20 MED ORDER — METRONIDAZOLE 500 MG PO TABS
500.0000 mg | ORAL_TABLET | Freq: Three times a day (TID) | ORAL | Status: DC
Start: 2015-01-20 — End: 2015-02-24

## 2015-01-20 NOTE — Patient Instructions (Signed)
Start cipro/flagyl. Follow up with Terri Piedra NP on Monday as scheduled. Go to the ER if you develop worsening abdominal pain, fever over 101.

## 2015-01-20 NOTE — Progress Notes (Signed)
Pre visit review using our clinic review tool, if applicable. No additional management support is needed unless otherwise documented below in the visit note. 

## 2015-01-20 NOTE — Progress Notes (Signed)
Subjective:    Patient ID: Jamie [name], female    DOB: 02/01/1933, 81 y.o.   MRN: [id number]  HPI  Ms. Alas is a 81 yr old female who presents today with chief complaint of LLQ pain x 3 days.  Hurts to move. Sitting still makes it feel better.  Reports that she has had similar pain in the past with a UTI.  Reports that pain started suprapubic then moved to the left.  Denies associated fever, nausea, vomitting, dysuria, or hematuria.  Reports bowels are regular and unchanged.    Past Medical History  Diagnosis Date  . ANEMIA, IRON DEFICIENCY 09/10/2007  . CATARACT EXTRACTION, HX OF 09/10/2007  . DYSPEPSIA, HX OF 09/10/2007  . HAIR LOSS 03/21/2009  . HAY FEVER 09/10/2007  . HEARING LOSS, SENSORINEURAL 09/10/2007  . HYPERLIPIDEMIA 09/10/2007  . HYPERTENSION 09/10/2007  . LOW BACK PAIN 05/10/2008  . Pain in joint, hand 03/01/2008  . PEPTIC ULCER DISEASE 09/10/2007  . POLYP, COLON 09/10/2007  . POLYPECTOMY, HX OF 09/10/2007  . SHINGLES 08/13/2009  . SHOULDER PAIN, LEFT 02/12/2008  . TAH/BSO, HX OF 09/10/2007    Review of Systems    see HPI  Past Medical History  Diagnosis Date  . ANEMIA, IRON DEFICIENCY 09/10/2007  . CATARACT EXTRACTION, HX OF 09/10/2007  . DYSPEPSIA, HX OF 09/10/2007  . HAIR LOSS 03/21/2009  . HAY FEVER 09/10/2007  . HEARING LOSS, SENSORINEURAL 09/10/2007  . HYPERLIPIDEMIA 09/10/2007  . HYPERTENSION 09/10/2007  . LOW BACK PAIN 05/10/2008  . Pain in joint, hand 03/01/2008  . PEPTIC ULCER DISEASE 09/10/2007  . POLYP, COLON 09/10/2007  . POLYPECTOMY, HX OF 09/10/2007  . SHINGLES 08/13/2009  . SHOULDER PAIN, LEFT 02/12/2008  . TAH/BSO, HX OF 09/10/2007    History   Social History  . Marital Status: Married    Spouse Name: N/A  . Number of Children: 4  . Years of Education: N/A   Occupational History  . [employer], retire 1998 after 30 yrs    Social History Main Topics  . Smoking status: Never Smoker   . Smokeless  tobacco: Not on file  . Alcohol Use: Not on file  . Drug Use: Not on file  . Sexual Activity: Not on file   Other Topics Concern  . Not on file   Social History Narrative   3 sons, 54,59, 35 and 1 daughter 40, 4 grandchildren   SO with multiple medical problems    Past Surgical History  Procedure Laterality Date  . Polypectomy    . Cataract extraction    . Irrigation and debridement sebaceous cyst    . Total abdominal hysterectomy w/ bilateral salpingoophorectomy      Family History  Problem Relation Age of Onset  . Hypertension Mother   . Coronary artery disease Mother   . Heart disease Mother     CHF  . Cancer Neg Hx     breast or colon  . Diabetes Neg Hx     No Known Allergies  Current Outpatient Prescriptions on File Prior to Visit  Medication Sig Dispense Refill  . acetaminophen (TYLENOL) 500 MG tablet Take 1,000 mg by mouth every 6 (six) hours as needed for mild pain.     Marland Kitchen ALPRAZolam (XANAX) 0.5 MG tablet Take 1 tablet (0.5 mg total) by mouth 3 (three) times daily as needed for anxiety. 90 tablet 0  . alum & mag hydroxide-simeth (MYLANTA) 200-200-20 MG/5ML suspension Take 30 mLs by  mouth daily as needed for indigestion or heartburn.     Marland Kitchen atorvastatin (LIPITOR) 20 MG tablet Take 1 tablet (20 mg total) by mouth daily. 90 tablet 3  . cholecalciferol (VITAMIN D) 400 UNITS TABS tablet Take 2,000 Units by mouth.    . clotrimazole-betamethasone (LOTRISONE) cream Apply 1 application topically 2 (two) times daily as needed (rash). 30 g 3  . furosemide (LASIX) 40 MG tablet TAKE 1 TABLET (40 MG TOTAL) BY MOUTH DAILY. 90 tablet 0  . furosemide (LASIX) 40 MG tablet TAKE 1 TABLET (40 MG TOTAL) BY MOUTH DAILY. 90 tablet 1  . methylcellulose (CITRUCEL) oral powder Take 1 packet by mouth daily as needed (constipation).     . promethazine (PHENERGAN) 12.5 MG tablet Take 1 tablet (12.5 mg total) by mouth every 8 (eight) hours as needed for nausea or vomiting. 20 tablet 0  .  ranitidine (ZANTAC) 150 MG tablet Take 150 mg by mouth 2 (two) times daily.    Marland Kitchen sulfamethoxazole-trimethoprim (SEPTRA DS) 800-160 MG per tablet Take 1 tablet by mouth 2 (two) times daily. 6 tablet 0  . valsartan (DIOVAN) 320 MG tablet TAKE 1 TABLET BY MOUTH DAILY 90 tablet 1   No current facility-administered medications on file prior to visit.    BP 144/80 mmHg  Pulse 71  Temp(Src) 97.6 F (36.4 C) (Oral)  Ht 5\' 3"  (1.6 m)  Wt 162 lb 8 oz (73.71 kg)  BMI 28.79 kg/m2  SpO2 97%    `  Objective:    Physical Exam  Constitutional: She is oriented to person, place, and time. She appears well-developed and well-nourished.  Cardiovascular: Normal rate, regular rhythm and normal heart sounds.   No murmur heard. Pulmonary/Chest: Effort normal and breath sounds normal. No respiratory distress. She has no wheezes.  Abdominal: Soft. There is tenderness in the left lower quadrant. There is no rigidity, no rebound, no guarding and no CVA tenderness.  Neurological: She is alert and oriented to person, place, and time.  Psychiatric: She has a normal mood and affect. Her behavior is normal. Judgment and thought content normal.    There were no vitals taken for this visit. Wt Readings from Last 3 Encounters:  11/29/14 162 lb (73.483 kg)  11/06/14 164 lb 12.8 oz (74.753 kg)  09/08/14 165 lb (74.844 kg)     Lab Results  Component Value Date   WBC 8.7 04/21/2014   HGB 11.2* 04/21/2014   HCT 34.5* 04/21/2014   PLT 180 04/21/2014   GLUCOSE 106* 04/21/2014   CHOL 143 09/22/2013   TRIG 38.0 09/22/2013   HDL 49.30 09/22/2013   LDLCALC 86 09/22/2013   ALT 14 09/22/2013   ALT 14 09/22/2013   AST 20 09/22/2013   AST 20 09/22/2013   NA 139 04/21/2014   K 4.0 04/21/2014   CL 104 04/21/2014   CREATININE 0.72 04/21/2014   BUN 12 04/21/2014   CO2 24 04/21/2014   TSH 1.12 04/28/2012    Dg Chest 2 View  09/08/2014   CLINICAL DATA:  Acute bronchitis. Rule out pneumonia. Cough and  congestion for 2 weeks.  EXAM: CHEST  2 VIEW  COMPARISON:  04/21/2014  FINDINGS: The cardiomediastinal silhouette is within normal limits. The lungs are normally to mildly hyperinflated. There is mild peribronchial thickening, more notable in the right lung. No confluent airspace opacity, overt pulmonary edema, pleural effusion, or pneumothorax is identified. Focal nodular density projecting over the anterior left fifth rib is unchanged. No acute osseous abnormality  is identified.  IMPRESSION: Mild peribronchial thickening, compatible with bronchitis. No evidence of pneumonia.   Electronically Signed   By: Logan Bores   On: 09/08/2014 10:26       Assessment & Plan:

## 2015-01-21 LAB — URINE CULTURE: Colony Count: 10000

## 2015-01-22 ENCOUNTER — Encounter: Payer: Self-pay | Admitting: Family

## 2015-01-22 ENCOUNTER — Ambulatory Visit (INDEPENDENT_AMBULATORY_CARE_PROVIDER_SITE_OTHER): Payer: Medicare PPO | Admitting: Family

## 2015-01-22 VITALS — BP 142/72 | HR 86 | Temp 98.3°F | Resp 18 | Ht 62.0 in | Wt 162.0 lb

## 2015-01-22 DIAGNOSIS — R1032 Left lower quadrant pain: Secondary | ICD-10-CM | POA: Insufficient documentation

## 2015-01-22 NOTE — Progress Notes (Signed)
Pre visit review using our clinic review tool, if applicable. No additional management support is needed unless otherwise documented below in the visit note. 

## 2015-01-22 NOTE — Progress Notes (Signed)
Subjective:    Patient ID: Jamie [name], female    DOB: 09-22-1933, 81 y.o.   MRN: [id number]  Chief Complaint  Patient presents with  . Flank Pain    x4 days, left side pain, urine has been collected on saturday and sent down for cx, only feels it when she's moving around    HPI:  Jamie [name] is a 81 y.o. female who presents today for follow up. She was recently seen in the office with LLQ pain and the UA at the time was unremarkable and the culture shows no predominant bacteria. She was treated with Cipro for UTI and metronidazole for diverticulitis.   She continues to experience the associated symptoms of left side pain located just above her iliac crest that has been going on for 4 days.  She notes that she has had some improvement since Saturday. Contextually notes that the pain is there when she moves around and states that she hardly feels it today. Today's current pain is 4-5/10. Denies any trauma or events that may have started the pain.    No Known Allergies   Current Outpatient Prescriptions on File Prior to Visit  Medication Sig Dispense Refill  . acetaminophen (TYLENOL) 500 MG tablet Take 1,000 mg by mouth every 6 (six) hours as needed for mild pain.     Marland Kitchen ALPRAZolam (XANAX) 0.5 MG tablet Take 1 tablet (0.5 mg total) by mouth 3 (three) times daily as needed for anxiety. 90 tablet 0  . alum & mag hydroxide-simeth (MYLANTA) 629-528-41 MG/5ML suspension Take 30 mLs by mouth daily as needed for indigestion or heartburn.     Marland Kitchen atorvastatin (LIPITOR) 20 MG tablet Take 1 tablet (20 mg total) by mouth daily. 90 tablet 3  . cholecalciferol (VITAMIN D) 400 UNITS TABS tablet Take 2,000 Units by mouth.    . ciprofloxacin (CIPRO) 500 MG tablet Take 1 tablet (500 mg total) by mouth 2 (two) times daily. 20 tablet 0  . clotrimazole-betamethasone (LOTRISONE) cream Apply 1 application topically 2 (two) times daily as needed (rash). 30 g 3  . furosemide (LASIX) 40 MG tablet  TAKE 1 TABLET (40 MG TOTAL) BY MOUTH DAILY. 90 tablet 0  . furosemide (LASIX) 40 MG tablet TAKE 1 TABLET (40 MG TOTAL) BY MOUTH DAILY. 90 tablet 1  . methylcellulose (CITRUCEL) oral powder Take 1 packet by mouth daily as needed (constipation).     . metroNIDAZOLE (FLAGYL) 500 MG tablet Take 1 tablet (500 mg total) by mouth 3 (three) times daily. 30 tablet 0  . promethazine (PHENERGAN) 12.5 MG tablet Take 1 tablet (12.5 mg total) by mouth every 8 (eight) hours as needed for nausea or vomiting. 20 tablet 0  . ranitidine (ZANTAC) 150 MG tablet Take 150 mg by mouth 2 (two) times daily.    Marland Kitchen sulfamethoxazole-trimethoprim (SEPTRA DS) 800-160 MG per tablet Take 1 tablet by mouth 2 (two) times daily. 6 tablet 0  . valsartan (DIOVAN) 320 MG tablet TAKE 1 TABLET BY MOUTH DAILY 90 tablet 1   No current facility-administered medications on file prior to visit.    Review of Systems  Constitutional: Negative for fever and chills.  Gastrointestinal: Negative for nausea, abdominal pain, diarrhea and constipation.  Genitourinary: Negative for dysuria, urgency, frequency and hematuria.      Objective:    BP 142/72 mmHg  Pulse 86  Temp(Src) 98.3 F (36.8 C) (Oral)  Resp 18  Ht 5\' 2"  (1.575 m)  Wt 162 lb (73.483  kg)  BMI 29.62 kg/m2  SpO2 98% Nursing note and vital signs reviewed.  Physical Exam  Constitutional: She is oriented to person, place, and time. She appears well-developed and well-nourished. No distress.  Cardiovascular: Normal rate, regular rhythm, normal heart sounds and intact distal pulses.   Pulmonary/Chest: Effort normal and breath sounds normal.  Abdominal: Normal appearance and bowel sounds are normal. She exhibits no distension and no mass. There is tenderness in the left lower quadrant. There is no rigidity, no rebound, no guarding, no CVA tenderness, no tenderness at McBurney's point and negative Murphy's sign.  Unable to elicit pain with palpation. Patient indicated location but  not currently experiencing pain.   Neurological: She is alert and oriented to person, place, and time.  Skin: Skin is warm and dry.  Psychiatric: She has a normal mood and affect. Her behavior is normal. Judgment and thought content normal.       Assessment & Plan:

## 2015-01-22 NOTE — Assessment & Plan Note (Addendum)
Pain is improved with previous treatment. Previous lab results show no clear indication of infection. Question origin of pain of infection versus musculoskeletal. Continue current metronidazole and 1 more day of Ciprofloxacin. If symptoms fail to improve will obtain imaging of abdomen.

## 2015-01-22 NOTE — Patient Instructions (Signed)
Thank you for choosing [facility].  Summary/Instructions:  If your symptoms worsen or fail to improve, please contact our office for further instruction, or in case of emergency go directly to the emergency room at the closest medical facility.   Take the Ciprofloxacin today and 1 pill tomorrow and then stop.  Please complete the metronidazole prescription.

## 2015-01-22 NOTE — Assessment & Plan Note (Signed)
No rebound or guarding, no CVAT.  Differential includes: kidney stone, diverticulitis, constipation.  UA unremarkable. Will send urine for culture, start empiric cipro/flagyl to cover for diverticulitis and urinary pathogens.  Close follow up on Monday with PCP.  If symptoms are not improved consider CT at that time (CT scan was unavailable on Saturday- unless pt was sent to ED).  She is instructed to go to the ED over the weekend if symptoms worsen. Pt verbalizes understanding.

## 2015-02-02 ENCOUNTER — Telehealth: Payer: Self-pay | Admitting: Family

## 2015-02-02 NOTE — Telephone Encounter (Signed)
Pt called in said that her stomach is hurting with new meds that she was given.  She wants to know if she can stop taking it and or is there something else she can take?

## 2015-02-02 NOTE — Telephone Encounter (Signed)
I am not sure what medication she is referring to. If it is the flagyl, then yes she can stop it.

## 2015-02-05 NOTE — Telephone Encounter (Signed)
Tried calling pt. Was not at home will try again later

## 2015-02-08 ENCOUNTER — Telehealth: Payer: Self-pay | Admitting: Family

## 2015-02-24 ENCOUNTER — Ambulatory Visit (INDEPENDENT_AMBULATORY_CARE_PROVIDER_SITE_OTHER): Payer: Medicare PPO | Admitting: Family Medicine

## 2015-02-24 ENCOUNTER — Encounter: Payer: Self-pay | Admitting: Family Medicine

## 2015-02-24 VITALS — BP 146/74 | HR 70 | Temp 98.6°F | Wt 161.5 lb

## 2015-02-24 DIAGNOSIS — R109 Unspecified abdominal pain: Secondary | ICD-10-CM | POA: Diagnosis not present

## 2015-02-24 LAB — POCT URINALYSIS DIPSTICK
BILIRUBIN UA: NEGATIVE
Glucose, UA: NEGATIVE
Ketones, UA: NEGATIVE
Nitrite, UA: NEGATIVE
PH UA: 6
Spec Grav, UA: 1.01
UROBILINOGEN UA: NEGATIVE

## 2015-02-24 NOTE — Progress Notes (Signed)
Pre visit review using our clinic review tool, if applicable. No additional management support is needed unless otherwise documented below in the visit note. 

## 2015-02-24 NOTE — Assessment & Plan Note (Signed)
New- improving.  Epigastric-improving with H2 blocker and belching. Abdominal exam benign and reassuring. UA pos for trace LE and RBCs only- send for cx but I doubt this is a urinary source.  Likely caused by increase in gas producing foods and acidic foods/beverages. Advised to continue taking Zantac.  Add Gas X or  Beano prn pain.  Call or return to clinic prn if these symptoms worsen or fail to improve as anticipated on Monday.  Advised to go to ER over the holiday weekend if symptoms worsen- red flag symptoms discussed.  The patient indicates understanding of these issues and agrees with the plan.

## 2015-02-24 NOTE — Patient Instructions (Signed)
Nice to meet you.  Continue taking your regular antacid- Zantac (ranititdine). Try over the counter Gas X or Beano for gas pain.  Try to stay away from gas producing foods like raw vegetables. Cut back on fiber supplements. Also try to avoid acidic foods like tomato based sauces and orange juice.  We are sending your urine for culture but I do not think you have a urinary infection.  Please follow up with your primary care provider if your symptoms do not continue to improve.  Go to the ER if your symptoms worsen later this weekend.  Have a nice Easter.

## 2015-02-24 NOTE — Progress Notes (Signed)
Subjective:   Patient ID: Jamie [name], female    DOB: 01/13/1933, 81 y.o.   MRN: [id number]  Jamie [name] is a pleasant 81 y.o. year old female patient of Terri Piedra, new to me,   who presents to weekend clinic today with Abdominal Pain  on 02/24/2015  HPI:  Of note, was seen for similar complaints twice last month.  Notes reviewed. Pain at that time was LLQ.  Initially on 2/20-- saw Debbrah Alar.  UA neg, Urine cx- contaminant. (previous urine cx from 11/06/14 neg). Prescribed cipro/flagyl to cover for diverticulitis and UTI.  Followed up with her PCP, Terri Piedra, two days later.  Since symptoms were improving, no further work up or treatment at that time.  Last colonoscopy- Dr. Olevia Perches- 10/23/2008- reviewed- showed polyps and diverticulosis.  Overdue for recall- advised 5 year recall per report.  Here today with epigastric pain and increased gas x 5 days- OTC ranitidine.  Not experiencing any LLQ with this episode. Pain does improve when she belches.  Pain initially was a 5/10 and now it is occuring less frequently and now a 1 or 2/10.  No changes in her bowel habits or blood in her stool.  No nausea vomiting or fevers.  Did eat tomato sauce and hast been drinking a lot of orange juice.  Current Outpatient Prescriptions on File Prior to Visit  Medication Sig Dispense Refill  . acetaminophen (TYLENOL) 500 MG tablet Take 1,000 mg by mouth every 6 (six) hours as needed for mild pain.     Marland Kitchen ALPRAZolam (XANAX) 0.5 MG tablet Take 1 tablet (0.5 mg total) by mouth 3 (three) times daily as needed for anxiety. 90 tablet 0  . alum & mag hydroxide-simeth (MYLANTA) 694-854-62 MG/5ML suspension Take 30 mLs by mouth daily as needed for indigestion or heartburn.     Marland Kitchen atorvastatin (LIPITOR) 20 MG tablet Take 1 tablet (20 mg total) by mouth daily. 90 tablet 3  . cholecalciferol (VITAMIN D) 400 UNITS TABS tablet Take 2,000 Units by mouth.    . clotrimazole-betamethasone  (LOTRISONE) cream Apply 1 application topically 2 (two) times daily as needed (rash). 30 g 3  . furosemide (LASIX) 40 MG tablet TAKE 1 TABLET (40 MG TOTAL) BY MOUTH DAILY. 90 tablet 1  . methylcellulose (CITRUCEL) oral powder Take 1 packet by mouth daily as needed (constipation).     . promethazine (PHENERGAN) 12.5 MG tablet Take 1 tablet (12.5 mg total) by mouth every 8 (eight) hours as needed for nausea or vomiting. 20 tablet 0  . ranitidine (ZANTAC) 150 MG tablet Take 150 mg by mouth 2 (two) times daily.    . valsartan (DIOVAN) 320 MG tablet TAKE 1 TABLET BY MOUTH DAILY 90 tablet 1   No current facility-administered medications on file prior to visit.    No Known Allergies  Past Medical History  Diagnosis Date  . ANEMIA, IRON DEFICIENCY 09/10/2007  . CATARACT EXTRACTION, HX OF 09/10/2007  . DYSPEPSIA, HX OF 09/10/2007  . HAIR LOSS 03/21/2009  . HAY FEVER 09/10/2007  . HEARING LOSS, SENSORINEURAL 09/10/2007  . HYPERLIPIDEMIA 09/10/2007  . HYPERTENSION 09/10/2007  . LOW BACK PAIN 05/10/2008  . Pain in joint, hand 03/01/2008  . PEPTIC ULCER DISEASE 09/10/2007  . POLYP, COLON 09/10/2007  . POLYPECTOMY, HX OF 09/10/2007  . SHINGLES 08/13/2009  . SHOULDER PAIN, LEFT 02/12/2008  . TAH/BSO, HX OF 09/10/2007    Past Surgical History  Procedure Laterality Date  . Polypectomy    .  Cataract extraction    . Irrigation and debridement sebaceous cyst    . Total abdominal hysterectomy w/ bilateral salpingoophorectomy      Family History  Problem Relation Age of Onset  . Hypertension Mother   . Coronary artery disease Mother   . Heart disease Mother     CHF  . Cancer Neg Hx     breast or colon  . Diabetes Neg Hx     History   Social History  . Marital Status: Married    Spouse Name: N/A  . Number of Children: 4  . Years of Education: N/A   Occupational History  . [employer], retire 1998 after 30 yrs    Social History Main Topics  . Smoking  status: Never Smoker   . Smokeless tobacco: Not on file  . Alcohol Use: Not on file  . Drug Use: Not on file  . Sexual Activity: Not on file   Other Topics Concern  . Not on file   Social History Narrative   3 sons, 78,59, 1 and 1 daughter 62, 4 grandchildren   SO with multiple medical problems   The PMH, PSH, Social History, Family History, Medications, and allergies have been reviewed in [facility], and have been updated if relevant.     Review of Systems  Constitutional: Negative.   Respiratory: Negative.   Cardiovascular: Negative.   Gastrointestinal: Positive for abdominal pain. Negative for nausea, vomiting, diarrhea, constipation, blood in stool, abdominal distention, anal bleeding and rectal pain.  Genitourinary: Negative.   Musculoskeletal: Negative.   Neurological: Negative.   All other systems reviewed and are negative.      Objective:    Pulse 70  Temp(Src) 98.6 F (37 C) (Oral)  Wt 161 lb 8 oz (73.256 kg)  SpO2 98%   Physical Exam  Constitutional: She is oriented to person, place, and time. She appears well-developed and well-nourished. No distress.  HENT:  Head: Normocephalic.  Eyes: Conjunctivae are normal.  Neck: Normal range of motion.  Cardiovascular: Normal rate.   Pulmonary/Chest: Effort normal.  Abdominal: Soft. Bowel sounds are normal. She exhibits no distension and no mass. There is no tenderness. There is no rebound and no guarding.  Musculoskeletal: She exhibits no edema.  Neurological: She is alert and oriented to person, place, and time. No cranial nerve deficit.  Skin: Skin is warm and dry.  Psychiatric: She has a normal mood and affect. Her behavior is normal. Judgment and thought content normal.  Nursing note and vitals reviewed.         Assessment & Plan:   Abdominal pain in female patient No Follow-up on file.

## 2015-03-24 ENCOUNTER — Other Ambulatory Visit: Payer: Self-pay | Admitting: Internal Medicine

## 2015-04-18 ENCOUNTER — Other Ambulatory Visit: Payer: Self-pay

## 2015-04-18 MED ORDER — ATORVASTATIN CALCIUM 20 MG PO TABS: 20.0000 mg | ORAL_TABLET | Freq: Every day | ORAL | Status: DC

## 2015-04-19 ENCOUNTER — Other Ambulatory Visit: Payer: Self-pay

## 2015-04-19 MED ORDER — ATORVASTATIN CALCIUM 20 MG PO TABS: 20.0000 mg | ORAL_TABLET | Freq: Every day | ORAL | Status: DC

## 2015-05-18 ENCOUNTER — Ambulatory Visit (INDEPENDENT_AMBULATORY_CARE_PROVIDER_SITE_OTHER): Payer: Medicare PPO | Admitting: Internal Medicine

## 2015-05-18 ENCOUNTER — Encounter: Payer: Self-pay | Admitting: Internal Medicine

## 2015-05-18 VITALS — BP 146/74 | HR 77 | Temp 98.4°F | Resp 18 | Wt 157.0 lb

## 2015-05-18 DIAGNOSIS — J4521 Mild intermittent asthma with (acute) exacerbation: Secondary | ICD-10-CM

## 2015-05-18 MED ORDER — HYDROCODONE-HOMATROPINE 5-1.5 MG/5ML PO SYRP: 5.0000 mL | ORAL_SOLUTION | Freq: Four times a day (QID) | ORAL | Status: DC | PRN

## 2015-05-18 MED ORDER — AZITHROMYCIN 250 MG PO TABS: ORAL_TABLET | ORAL | Status: DC

## 2015-05-18 MED ORDER — PREDNISONE 10 MG PO TABS: ORAL_TABLET | ORAL | Status: DC

## 2015-05-18 NOTE — Progress Notes (Signed)
Pre visit review using our clinic review tool, if applicable. No additional management support is needed unless otherwise documented below in the visit note. 

## 2015-05-18 NOTE — Progress Notes (Signed)
   Subjective:    Patient ID: Jamie [name], female    DOB: Apr 25, 1933, 81 y.o.   MRN: [id number]  HPI Her symptoms started 05/13/15 as a dry cough and sneezing. The cough progressed & is associated with some wheezing .As of 6/15 she was producing white-yellow sputum. All nasal secretions have been clear and in the form of postnasal drainage. She's had a mild headache over the crown of her head when she coughs. She's also had some mild nausea which may be related to postnasal drainage. This is also been associated with some sore throat.  The cough is awakening her.  She has no history of asthma. She did smoke 1952-1991 up to 3-4 cigarettes per day.   Review of Systems Frontal headache, facial pain , nasal purulence, dental pain, sore throat , otic pain or otic discharge denied. No fever , chills or sweats.     Objective:   Physical Exam  Pertinent or positive findings include: She appears much younger than her stated age. She has dense arcus senilis. There is mild erythema of the nasal mucosa. Wax in both otic canals.She has an upper plate and lower partial. Breath sounds are decreased in the upper lobes but she is expiratory rhonchi in both lower lobes. She has a grade 1 systolic murmur at the right base.  General appearance :adequately nourished; in no distress.  Eyes: No conjunctival inflammation or scleral icterus is present.  Oral exam:  Lips and gums are healthy appearing.There is no oropharyngeal erythema or exudate noted.   Heart:  Normal rate and regular rhythm. S1 and S2 normal without gallop, click, rub or other extra sounds    Lungs: No increased work of breathing.   Abdomen: bowel sounds normal, soft and non-tender without masses, organomegaly or hernias noted.  No guarding or rebound..  Vascular : all pulses equal ; no bruits present.  Skin:Warm & dry.  Intact without suspicious lesions or rashes ; no tenting    Lymphatic: No lymphadenopathy is noted about the  head, neck, axilla   Neuro: Strength, tone  normal.         Assessment & Plan:  #1 asthmatic bronchitis See orders & AVS

## 2015-05-18 NOTE — Patient Instructions (Addendum)
Plain Mucinex (NOT D) for thick secretions ;force NON dairy fluids .   Nasal cleansing in the shower as discussed with lather of mild shampoo.After 10 seconds wash off lather while  exhaling through nostrils. Make sure that all residual soap is removed to prevent irritation.  Flonase OR Nasacort AQ 1 spray in each nostril twice a day as needed. Use the "crossover" technique into opposite nostril spraying toward opposite ear @ 45 degree angle, not straight up into nostril.  Plain Allegra (NOT D )  160 daily , Loratidine 10 mg , OR Zyrtec 10 mg @ bedtime  as needed for itchy eyes & sneezing.   Please do not use Q-tips as this simply packs the wax down against the eardrum. Should wax build up occur, please put 2-3 drops of mineral oil in the ear at night and cover the canal with a  cotton ball.In the morning fill the canal with hydrogen peroxide & leave  for 10-15 minutes.Following this shower and use the thinnest washrag available to wick out the wax.

## 2015-05-25 ENCOUNTER — Other Ambulatory Visit: Payer: Self-pay

## 2015-05-25 MED ORDER — ATORVASTATIN CALCIUM 20 MG PO TABS: 20.0000 mg | ORAL_TABLET | Freq: Every day | ORAL | Status: DC

## 2015-06-26 ENCOUNTER — Other Ambulatory Visit: Payer: Self-pay | Admitting: Family

## 2015-06-28 ENCOUNTER — Other Ambulatory Visit: Payer: Self-pay | Admitting: Family

## 2015-06-29 ENCOUNTER — Other Ambulatory Visit: Payer: Self-pay | Admitting: Family

## 2015-07-31 ENCOUNTER — Telehealth: Payer: Self-pay

## 2015-07-31 NOTE — Telephone Encounter (Signed)
Call to Mrs. Mirante; agreed to schedule AWV for 9/9 at 10am

## 2015-08-09 LAB — HM MAMMOGRAPHY: HM Mammogram: 0.5

## 2015-08-10 ENCOUNTER — Ambulatory Visit (INDEPENDENT_AMBULATORY_CARE_PROVIDER_SITE_OTHER): Payer: Medicare PPO

## 2015-08-10 ENCOUNTER — Telehealth: Payer: Self-pay

## 2015-08-10 VITALS — BP 130/70 | Ht 61.5 in | Wt 165.2 lb

## 2015-08-10 DIAGNOSIS — Z0000 Encounter for general adult medical examination without abnormal findings: Secondary | ICD-10-CM

## 2015-08-10 NOTE — Telephone Encounter (Signed)
Fax rec'd from [place] with mammogram; completed 08/09/2015 at [facility] There was noted a 0.5 cm cluster of calcifications in the right breast suspicious of malignancy;  The patient states BX is set up for next week.

## 2015-08-10 NOTE — Telephone Encounter (Signed)
Call to [facility] to request mammogram; was sent to office but will fax directly to [phone] for abstraction of data

## 2015-08-10 NOTE — Patient Instructions (Addendum)
Jamie [name] , Thank you for taking time to come for your Medicare Wellness Visit. I appreciate your ongoing commitment to your health goals. Please review the following plan we discussed and let me know if I can assist you in the future.   These are the goals we discussed: Goals    . Exercise 150 minutes per week (moderate activity)     States she would like to start walking some;  Grandson bought a treadmill; attempt to start 3 days a week; attempt after breakfast ; will try to work to 30 min; as  tolerated       Will take flu shot when seeing Dr. Doug Sou next week;   Discussed hep C antibody and will have labs drawn by dr. Doug Sou  Dental hygienist program at [place] for teeth cleaning  Dexa scan 2013 was normal; height less than the patient thought at 67ft 1.5in  Check with [facility] regarding shingles vaccination and cost at office or pharmacy  Advanced Directive given and follow up available as needed    This is a list of the screening recommended for you and due dates:  Health Maintenance  Topic Date Due  . Flu Shot  07/02/2015  . Mammogram  08/04/2015  . Colon Cancer Screening  10/23/2018  . Tetanus Vaccine  05/17/2020  . DEXA scan (bone density measurement)  Completed  . Shingles Vaccine  Completed  . Pneumonia vaccines  Completed     Health Maintenance Adopting a healthy lifestyle and getting preventive care can go a long way to promote health and wellness. Talk with your health care provider about what schedule of regular examinations is right for you. This is a good chance for you to check in with your provider about disease prevention and staying healthy. In between checkups, there are plenty of things you can do on your own. Experts have done a lot of research about which lifestyle changes and preventive measures are most likely to keep you healthy. Ask your health care provider for more information. WEIGHT AND DIET  Eat a healthy diet  Be sure to include  plenty of vegetables, fruits, low-fat dairy products, and lean protein.  Do not eat a lot of foods high in solid fats, added sugars, or salt.  Get regular exercise. This is one of the most important things you can do for your health.  Most adults should exercise for at least 150 minutes each week. The exercise should increase your heart rate and make you sweat (moderate-intensity exercise).  Most adults should also do strengthening exercises at least twice a week. This is in addition to the moderate-intensity exercise.  Maintain a healthy weight  Body mass index (BMI) is a measurement that can be used to identify possible weight problems. It estimates body fat based on height and weight. Your health care provider can help determine your BMI and help you achieve or maintain a healthy weight.  For females 63 years of age and older:   A BMI below 18.5 is considered underweight.  A BMI of 18.5 to 24.9 is normal.  A BMI of 25 to 29.9 is considered overweight.  A BMI of 30 and above is considered obese.  Watch levels of cholesterol and blood lipids  You should start having your blood tested for lipids and cholesterol at 79 years of age, then have this test every 5 years.  You may need to have your cholesterol levels checked more often if:  Your lipid or cholesterol levels  are high.  You are older than 79 years of age.  You are at high risk for heart disease.  CANCER SCREENING   Lung Cancer  Lung cancer screening is recommended for adults 52-66 years old who are at high risk for lung cancer because of a history of smoking.  A yearly low-dose CT scan of the lungs is recommended for people who:  Currently smoke.  Have quit within the past 15 years.  Have at least a 30-pack-year history of smoking. A pack year is smoking an average of one pack of cigarettes a day for 1 year.  Yearly screening should continue until it has been 15 years since you quit.  Yearly screening  should stop if you develop a health problem that would prevent you from having lung cancer treatment.  Breast Cancer  Practice breast self-awareness. This means understanding how your breasts normally appear and feel.  It also means doing regular breast self-exams. Let your health care provider know about any changes, no matter how small.  If you are in your 20s or 30s, you should have a clinical breast exam (CBE) by a health care provider every 1-3 years as part of a regular health exam.  If you are 62 or older, have a CBE every year. Also consider having a breast X-ray (mammogram) every year.  If you have a family history of breast cancer, talk to your health care provider about genetic screening.  If you are at high risk for breast cancer, talk to your health care provider about having an MRI and a mammogram every year.  Breast cancer gene (BRCA) assessment is recommended for women who have family members with BRCA-related cancers. BRCA-related cancers include:  Breast.  Ovarian.  Tubal.  Peritoneal cancers.  Results of the assessment will determine the need for genetic counseling and BRCA1 and BRCA2 testing. Cervical Cancer Routine pelvic examinations to screen for cervical cancer are no longer recommended for nonpregnant women who are considered low risk for cancer of the pelvic organs (ovaries, uterus, and vagina) and who do not have symptoms. A pelvic examination may be necessary if you have symptoms including those associated with pelvic infections. Ask your health care provider if a screening pelvic exam is right for you.   The Pap test is the screening test for cervical cancer for women who are considered at risk.  If you had a hysterectomy for a problem that was not cancer or a condition that could lead to cancer, then you no longer need Pap tests.  If you are older than 65 years, and you have had normal Pap tests for the past 10 years, you no longer need to have Pap  tests.  If you have had past treatment for cervical cancer or a condition that could lead to cancer, you need Pap tests and screening for cancer for at least 20 years after your treatment.  If you no longer get a Pap test, assess your risk factors if they change (such as having a new sexual partner). This can affect whether you should start being screened again.  Some women have medical problems that increase their chance of getting cervical cancer. If this is the case for you, your health care provider may recommend more frequent screening and Pap tests.  The human papillomavirus (HPV) test is another test that may be used for cervical cancer screening. The HPV test looks for the virus that can cause cell changes in the cervix. The cells collected during the  Pap test can be tested for HPV.  The HPV test can be used to screen women 63 years of age and older. Getting tested for HPV can extend the interval between normal Pap tests from three to five years.  An HPV test also should be used to screen women of any age who have unclear Pap test results.  After 79 years of age, women should have HPV testing as often as Pap tests.  Colorectal Cancer  This type of cancer can be detected and often prevented.  Routine colorectal cancer screening usually begins at 79 years of age and continues through 79 years of age.  Your health care provider may recommend screening at an earlier age if you have risk factors for colon cancer.  Your health care provider may also recommend using home test kits to check for hidden blood in the stool.  A small camera at the end of a tube can be used to examine your colon directly (sigmoidoscopy or colonoscopy). This is done to check for the earliest forms of colorectal cancer.  Routine screening usually begins at age 49.  Direct examination of the colon should be repeated every 5-10 years through 79 years of age. However, you may need to be screened more often if  early forms of precancerous polyps or small growths are found. Skin Cancer  Check your skin from head to toe regularly.  Tell your health care provider about any new moles or changes in moles, especially if there is a change in a mole's shape or color.  Also tell your health care provider if you have a mole that is larger than the size of a pencil eraser.  Always use sunscreen. Apply sunscreen liberally and repeatedly throughout the day.  Protect yourself by wearing long sleeves, pants, a wide-brimmed hat, and sunglasses whenever you are outside. HEART DISEASE, DIABETES, AND HIGH BLOOD PRESSURE   Have your blood pressure checked at least every 1-2 years. High blood pressure causes heart disease and increases the risk of stroke.  If you are between 46 years and 58 years old, ask your health care provider if you should take aspirin to prevent strokes.  Have regular diabetes screenings. This involves taking a blood sample to check your fasting blood sugar level.  If you are at a normal weight and have a low risk for diabetes, have this test once every three years after 79 years of age.  If you are overweight and have a high risk for diabetes, consider being tested at a younger age or more often. PREVENTING INFECTION  Hepatitis B  If you have a higher risk for hepatitis B, you should be screened for this virus. You are considered at high risk for hepatitis B if:  You were born in a country where hepatitis B is common. Ask your health care provider which countries are considered high risk.  Your parents were born in a high-risk country, and you have not been immunized against hepatitis B (hepatitis B vaccine).  You have HIV or AIDS.  You use needles to inject street drugs.  You live with someone who has hepatitis B.  You have had sex with someone who has hepatitis B.  You get hemodialysis treatment.  You take certain medicines for conditions, including cancer, organ  transplantation, and autoimmune conditions. Hepatitis C  Blood testing is recommended for:  Everyone born from [birth year] through 1963.  Anyone with known risk factors for hepatitis C. Sexually transmitted infections (STIs)  You should be  screened for sexually transmitted infections (STIs) including gonorrhea and chlamydia if:  You are sexually active and are younger than 79 years of age.  You are older than 79 years of age and your health care provider tells you that you are at risk for this type of infection.  Your sexual activity has changed since you were last screened and you are at an increased risk for chlamydia or gonorrhea. Ask your health care provider if you are at risk.  If you do not have HIV, but are at risk, it may be recommended that you take a prescription medicine daily to prevent HIV infection. This is called pre-exposure prophylaxis (PrEP). You are considered at risk if:  You are sexually active and do not regularly use condoms or know the HIV status of your partner(s).  You take drugs by injection.  You are sexually active with a partner who has HIV. Talk with your health care provider about whether you are at high risk of being infected with HIV. If you choose to begin PrEP, you should first be tested for HIV. You should then be tested every 3 months for as long as you are taking PrEP.  PREGNANCY   If you are premenopausal and you may become pregnant, ask your health care provider about preconception counseling.  If you may become pregnant, take 400 to 800 micrograms (mcg) of folic acid every day.  If you want to prevent pregnancy, talk to your health care provider about birth control (contraception). OSTEOPOROSIS AND MENOPAUSE   Osteoporosis is a disease in which the bones lose minerals and strength with aging. This can result in serious bone fractures. Your risk for osteoporosis can be identified using a bone density scan.  If you are 25 years of age or older,  or if you are at risk for osteoporosis and fractures, ask your health care provider if you should be screened.  Ask your health care provider whether you should take a calcium or vitamin D supplement to lower your risk for osteoporosis.  Menopause may have certain physical symptoms and risks.  Hormone replacement therapy may reduce some of these symptoms and risks. Talk to your health care provider about whether hormone replacement therapy is right for you.  HOME CARE INSTRUCTIONS   Schedule regular health, dental, and eye exams.  Stay current with your immunizations.   Do not use any tobacco products including cigarettes, chewing tobacco, or electronic cigarettes.  If you are pregnant, do not drink alcohol.  If you are breastfeeding, limit how much and how often you drink alcohol.  Limit alcohol intake to no more than 1 drink per day for nonpregnant women. One drink equals 12 ounces of beer, 5 ounces of wine, or 1 ounces of hard liquor.  Do not use street drugs.  Do not share needles.  Ask your health care provider for help if you need support or information about quitting drugs.  Tell your health care provider if you often feel depressed.  Tell your health care provider if you have ever been abused or do not feel safe at home. Document Released: 06/02/2011 Document Revised: 04/03/2014 Document Reviewed: 10/19/2013 [facility] Patient Information 2015 [place], [place]. This information is not intended to replace advice given to you by your health care provider. Make sure you discuss any questions you have with your health care provider.

## 2015-08-10 NOTE — Progress Notes (Addendum)
Subjective:   Jamie [name] is a 81 y.o. female who presents for Medicare Annual (Subsequent) preventive examination.  Review of Systems:   Cardiac Risk Factors include: advanced age (>48men, >80 women);hypertension;obesity (BMI >30kg/m2) HRA assessment completed during visit; [place] Patient is here for Annual Wellness Assessment The Patient was informed that this wellness visit is to identify risk and educate on how to reduce risk for increase disease through lifestyle changes.   ROS deferred to CPE exam with physician Hx HTN;  Hyperlipidemia Blood Glucose 106 03/2014; first elevation since 2012  BMI:  Diet; good appetite; fruits and vegetables; she cooks Sometimes skips lunch;  Exercise; don't' do much; spouse is sick; he has a walker; son and dtr;  Son and dtr lives with her Has 3 boys and one girl;    SAFETY One level; good access getting in and out of bathroom; shower is in the tub; OA in wrist ; better now  Safety reviewed for the home; including removal of clutter; clear paths through the home, eliminating clutter, railing as needed; bathroom safety; community safety; smoke detectors and firearms safety as well as sun protection;  Driving accidents; still drives; and seatbelt; yes Stressors; 1 - 10 about 4; loves playing with grand's 8 -10yo   Anxiety attacks on occasion; pill helps these Sleep is ok; but checks spouse BS at hs and does this at hs;   Medication review/ New meds/   Fall assessment none  AD8 Score 0; states spouse has some memory issues but is getting sick Mobilization and Functional losses in the last year. none   Urinary or fecal incontinence reviewed/ hx of urinary incont   Lifeline: http://www.lifelinesys.com/content/home; [phone]   Counseling: Colonoscopy; 10/23/2008/ ok; Dr. Olevia Perches; talked aging and having if medically necessary in the future EKG 098/05/2011/ no issues Dexa scan; 08/2012 T scores normal Mammogram  08/2014 no evidence of malignancy/ States she had one this week on Tuesday 09/06 Will call [facility] and ask them to send report Have to go back next Thursday; will do needle bx; right breast;  Hearing: are done consistently Dental; Need to go to the dentist;  Ophthalmology exam; time to get annual checkup; dr. Rachael Fee   Immunizations Due  Flu shot will take on Tuesday;   Current Care Team reviewed and updated Dr. Bing Plume;     Objective:     Vitals: BP 130/70 mmHg  Ht 5' 1.5" (1.562 m)  Wt 165 lb 4 oz (74.957 kg)  BMI 30.72 kg/m2  Tobacco History  Smoking status  . Former Smoker -- 0.20 packs/day for 25 years  . Types: Cigarettes  . Quit date: 12/01/1989  Smokeless tobacco  . Not on file    Comment: smoked a long time ago; Quit 18 or 89/ number is approximate     Counseling given: Not Answered   Past Medical History  Diagnosis Date  . ANEMIA, IRON DEFICIENCY 09/10/2007  . CATARACT EXTRACTION, HX OF 09/10/2007  . DYSPEPSIA, HX OF 09/10/2007  . HAIR LOSS 03/21/2009  . HAY FEVER 09/10/2007  . HEARING LOSS, SENSORINEURAL 09/10/2007  . HYPERLIPIDEMIA 09/10/2007  . HYPERTENSION 09/10/2007  . LOW BACK PAIN 05/10/2008  . Pain in joint, hand 03/01/2008  . PEPTIC ULCER DISEASE 09/10/2007  . POLYP, COLON 09/10/2007  . POLYPECTOMY, HX OF 09/10/2007  . SHINGLES 08/13/2009  . SHOULDER PAIN, LEFT 02/12/2008  . TAH/BSO, HX OF 09/10/2007   Past Surgical History  Procedure Laterality Date  . Polypectomy    .  Cataract extraction    . Irrigation and debridement sebaceous cyst    . Total abdominal hysterectomy w/ bilateral salpingoophorectomy     Family History  Problem Relation Age of Onset  . Hypertension Mother   . Coronary artery disease Mother   . Heart disease Mother     CHF  . Cancer Neg Hx     breast or colon  . Diabetes Neg Hx    History  Sexual Activity  . Sexual Activity: Not on file    Outpatient Encounter Prescriptions as of 08/10/2015  Medication Sig  .  acetaminophen (TYLENOL) 500 MG tablet Take 1,000 mg by mouth every 6 (six) hours as needed for mild pain.   Marland Kitchen ALPRAZolam (XANAX) 0.5 MG tablet Take 1 tablet (0.5 mg total) by mouth 3 (three) times daily as needed for anxiety.  Marland Kitchen alum & mag hydroxide-simeth (MYLANTA) 200-200-20 MG/5ML suspension Take 30 mLs by mouth daily as needed for indigestion or heartburn.   Marland Kitchen atorvastatin (LIPITOR) 20 MG tablet Take 1 tablet (20 mg total) by mouth daily.  . cholecalciferol (VITAMIN D) 400 UNITS TABS tablet Take 2,000 Units by mouth.  . clotrimazole-betamethasone (LOTRISONE) cream Apply 1 application topically 2 (two) times daily as needed (rash).  . furosemide (LASIX) 40 MG tablet TAKE 1 TABLET (40 MG TOTAL) BY MOUTH DAILY.  Marland Kitchen HYDROcodone-homatropine (HYDROMET) 5-1.5 MG/5ML syrup Take 5 mLs by mouth every 6 (six) hours as needed for cough.  . methylcellulose (CITRUCEL) oral powder Take 1 packet by mouth daily as needed (constipation).   . ranitidine (ZANTAC) 150 MG tablet Take 150 mg by mouth 2 (two) times daily.  . valsartan (DIOVAN) 320 MG tablet TAKE 1 TABLET BY MOUTH DAILY  . azithromycin (ZITHROMAX Z-PAK) 250 MG tablet 2 day 1, then 1 qd (Patient not taking: Reported on 08/10/2015)  . promethazine (PHENERGAN) 12.5 MG tablet Take 1 tablet (12.5 mg total) by mouth every 8 (eight) hours as needed for nausea or vomiting. (Patient not taking: Reported on 08/10/2015)  . [DISCONTINUED] predniSONE (DELTASONE) 10 MG tablet 1 tid pc (Patient not taking: Reported on 08/10/2015)   No facility-administered encounter medications on file as of 08/10/2015.    Activities of Daily Living In your present state of health, do you have any difficulty performing the following activities: 08/10/2015 01/22/2015  Hearing? N Y  Vision? N N  Difficulty concentrating or making decisions? N N  Walking or climbing stairs? N N  Dressing or bathing? N N  Doing errands, shopping? N N  Preparing Food and eating ? N -  Using the Toilet? N -    In the past six months, have you accidently leaked urine? N -  Do you have problems with loss of bowel control? N -  Managing your Medications? N -  Managing your Finances? N -  Housekeeping or managing your Housekeeping? N -    Patient Care Team: [place], FNP as PCP - General (Family Medicine)    Assessment:    Assessment    Today patient counseled on age appropriate routine health concerns for screening and prevention, each reviewed and up to date or declined. Immunizations reviewed and up to date; referred to insurance carrier for cost of getting shingles in the office or at the pharmacy; Will take flu shot when she see' Dr. Doug Sou. Labs deferred for CPE or medical fup by MD.  Risk factors for depression reviewed and negative. Hearing function and visual acuity are intact. ADLs screened and addressed  as needed. Functional ability and level of safety reviewed and appropriate. Educated on memory loss and AD8 screen was neg;   Education, counseling and referrals performed based on assessed risks today. Patient provided with a copy of personalized plan for preventive services and due dates  FALL PREVENTION no falls; the patient did agree to start walking; States grand son purchased a treadmill in the home; States she will start walking perhaps 3 days a week after breakfast when spouse is napping.   HEPATITIS SCREEN reviewed; no risk identified; will discuss Hep C antibody test with Dr. Doug Sou next week;   TOBACCO; hx but quit with limited smoking/ETOH or other DRUG use was negative   Barriers to successful management: Patient is a full time caregiver; son and dtr live in the home and can assist; the patient still drives; no accidents;   Stress;(1-10) and is approx a 4 to 5 most days; Does admit to occasional anxiety and does take medicine for relief. Sleeps fairly well when she lies down;    Did agree to review Advanced Directive and will call if she needs assistance;  Took  information home with a form   Exercise Activities and Dietary recommendations Current Exercise Habits:: Home exercise routine (housework; will start walking), Type of exercise: walking, Time (Minutes): 30, Frequency (Times/Week): 3, Weekly Exercise (Minutes/Week): 90, Intensity: Mild  Goals    . Exercise 150 minutes per week (moderate activity)     States she would like to start walking some;  Grandson bought a treadmill; attempt to start 3 days a week; attempt after breakfast ; will try to work to 30 min; as  tolerated      Fall Risk Fall Risk  08/10/2015 01/22/2015  Falls in the past year? No No   Depression Screen PHQ 2/9 Scores 08/10/2015 01/22/2015  PHQ - 2 Score 0 0     Cognitive Testing MMSE - Mini Mental State Exam 08/10/2015  Not completed: (No Data)    Immunization History  Administered Date(s) Administered  . Influenza Split 09/21/2012  . Influenza Whole 11/08/2007, 09/12/2008, 09/11/2009, 09/24/2010  . Influenza, High Dose Seasonal PF 10/11/2013  . Influenza,inj,Quad PF,36+ Mos 08/08/2014  . Pneumococcal Conjugate-13 09/22/2013  . Pneumococcal Polysaccharide-23 03/01/2008  . Td 05/17/2010   Screening Tests Health Maintenance  Topic Date Due  . INFLUENZA VACCINE  07/02/2015  . MAMMOGRAM  08/04/2015  . COLONOSCOPY  10/23/2018  . TETANUS/TDAP  05/17/2020  . DEXA SCAN  Completed  . ZOSTAVAX  Completed  . PNA vac Low Risk Adult  Completed      Plan:    To fup on annual eye exam with Dr. Bing Plume Just had mammogram at [facility] [facility] Stated she had this week on Tuesday and went back on Thursday and scheduled bx next Thursday due to "calcium deposit".   Dexa scan in 2013 was negative; will discuss with dr. Doug Sou if she would like to repeat; Currently on Vit D   Asked about shingles but this has been done; not sure the patient rec'd it; as planned even though documented   Information given on AD  During the course of the visit the patient was educated and counseled about  the following appropriate screening and preventive services:   Vaccines to include Pneumoccal, Influenza, Hepatitis B, Td, Zostavax, HCV/ To take flu shot on Tuesday and will check on Zoster  Electrocardiogram; 08/2011  Cardiovascular Disease/ No issue at this time/ mother had heart disease; BP good; BMI 30 but does not want  to lose weight; does agreed to start walking; [place] 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 Doug Sou the patient felt she was 41ft 3in and she was 90ft 1.5in  Diabetes screening/n/a  Glaucoma screening/ to be scheduled this year; has vision check annually  Mammography/ in process  Nutrition counseling / cooks at home; appetite good; overweight but states she has been this weight and does not feel this is an issue  Patient Instructions (the written plan) was given to the patient.   Wynetta Fines, RN  08/10/2015

## 2015-08-10 NOTE — Progress Notes (Signed)
Medical screening examination/treatment/procedure(s) were performed by non-physician practitioner and as supervising physician I was immediately available for consultation/collaboration. I agree with above. Kollar, Elizabeth A, MD   

## 2015-08-14 ENCOUNTER — Other Ambulatory Visit (INDEPENDENT_AMBULATORY_CARE_PROVIDER_SITE_OTHER): Payer: Medicare PPO

## 2015-08-14 ENCOUNTER — Encounter: Payer: Medicare PPO | Admitting: Family

## 2015-08-14 ENCOUNTER — Encounter: Payer: Self-pay | Admitting: Internal Medicine

## 2015-08-14 ENCOUNTER — Ambulatory Visit (INDEPENDENT_AMBULATORY_CARE_PROVIDER_SITE_OTHER): Payer: Medicare PPO | Admitting: Internal Medicine

## 2015-08-14 VITALS — BP 138/62 | HR 77 | Temp 98.7°F | Resp 16 | Ht 61.0 in | Wt 164.0 lb

## 2015-08-14 DIAGNOSIS — E785 Hyperlipidemia, unspecified: Secondary | ICD-10-CM

## 2015-08-14 DIAGNOSIS — I10 Essential (primary) hypertension: Secondary | ICD-10-CM

## 2015-08-14 DIAGNOSIS — K219 Gastro-esophageal reflux disease without esophagitis: Secondary | ICD-10-CM

## 2015-08-14 DIAGNOSIS — Z23 Encounter for immunization: Secondary | ICD-10-CM

## 2015-08-14 LAB — COMPREHENSIVE METABOLIC PANEL
ALBUMIN: 4.2 g/dL (ref 3.5–5.2)
ALT: 10 U/L (ref 0–35)
AST: 15 U/L (ref 0–37)
Alkaline Phosphatase: 160 U/L — ABNORMAL HIGH (ref 39–117)
BUN: 9 mg/dL (ref 6–23)
CHLORIDE: 106 meq/L (ref 96–112)
CO2: 30 meq/L (ref 19–32)
CREATININE: 0.78 mg/dL (ref 0.40–1.20)
Calcium: 9.5 mg/dL (ref 8.4–10.5)
GFR: 91.28 mL/min (ref >60.00)
GLUCOSE: 104 mg/dL — AB (ref 70–99)
Potassium: 4.1 mEq/L (ref 3.5–5.1)
SODIUM: 142 meq/L (ref 135–145)
Total Bilirubin: 0.7 mg/dL (ref 0.2–1.2)
Total Protein: 7.6 g/dL (ref 6.0–8.3)

## 2015-08-14 LAB — LIPID PANEL
CHOL/HDL RATIO: 3
CHOLESTEROL: 146 mg/dL (ref 0–200)
HDL: 49.1 mg/dL (ref >39.00)
LDL CALC: 85 mg/dL (ref 0–99)
NONHDL: 97.01
Triglycerides: 61 mg/dL (ref 0.0–149.0)
VLDL: 12.2 mg/dL (ref 0.0–40.0)

## 2015-08-14 MED ORDER — PROMETHAZINE HCL 12.5 MG PO TABS: 12.5000 mg | ORAL_TABLET | Freq: Three times a day (TID) | ORAL | Status: DC | PRN

## 2015-08-14 NOTE — Patient Instructions (Addendum)
We will check the blood work today and call you back with the results. We have sent in the refill of the nausea medicine called promethazine (phenergan).   Health Maintenance Adopting a healthy lifestyle and getting preventive care can go a long way to promote health and wellness. Talk with your health care provider about what schedule of regular examinations is right for you. This is a good chance for you to check in with your provider about disease prevention and staying healthy. In between checkups, there are plenty of things you can do on your own. Experts have done a lot of research about which lifestyle changes and preventive measures are most likely to keep you healthy. Ask your health care provider for more information. WEIGHT AND DIET  Eat a healthy diet  Be sure to include plenty of vegetables, fruits, low-fat dairy products, and lean protein.  Do not eat a lot of foods high in solid fats, added sugars, or salt.  Get regular exercise. This is one of the most important things you can do for your health.  Most adults should exercise for at least 150 minutes each week. The exercise should increase your heart rate and make you sweat (moderate-intensity exercise).  Most adults should also do strengthening exercises at least twice a week. This is in addition to the moderate-intensity exercise.  Maintain a healthy weight  Body mass index (BMI) is a measurement that can be used to identify possible weight problems. It estimates body fat based on height and weight. Your health care provider can help determine your BMI and help you achieve or maintain a healthy weight.  For females 48 years of age and older:   A BMI below 18.5 is considered underweight.  A BMI of 18.5 to 24.9 is normal.  A BMI of 25 to 29.9 is considered overweight.  A BMI of 30 and above is considered obese.  Watch levels of cholesterol and blood lipids  You should start having your blood tested for lipids and  cholesterol at 79 years of age, then have this test every 5 years.  You may need to have your cholesterol levels checked more often if:  Your lipid or cholesterol levels are high.  You are older than 79 years of age.  You are at high risk for heart disease.  CANCER SCREENING   Lung Cancer  Lung cancer screening is recommended for adults 35-11 years old who are at high risk for lung cancer because of a history of smoking.  A yearly low-dose CT scan of the lungs is recommended for people who:  Currently smoke.  Have quit within the past 15 years.  Have at least a 30-pack-year history of smoking. A pack year is smoking an average of one pack of cigarettes a day for 1 year.  Yearly screening should continue until it has been 15 years since you quit.  Yearly screening should stop if you develop a health problem that would prevent you from having lung cancer treatment.  Breast Cancer  Practice breast self-awareness. This means understanding how your breasts normally appear and feel.  It also means doing regular breast self-exams. Let your health care provider know about any changes, no matter how small.  If you are in your 20s or 30s, you should have a clinical breast exam (CBE) by a health care provider every 1-3 years as part of a regular health exam.  If you are 10 or older, have a CBE every year. Also consider having  a breast X-ray (mammogram) every year.  If you have a family history of breast cancer, talk to your health care provider about genetic screening.  If you are at high risk for breast cancer, talk to your health care provider about having an MRI and a mammogram every year.  Breast cancer gene (BRCA) assessment is recommended for women who have family members with BRCA-related cancers. BRCA-related cancers include:  Breast.  Ovarian.  Tubal.  Peritoneal cancers.  Results of the assessment will determine the need for genetic counseling and BRCA1 and BRCA2  testing. Cervical Cancer Routine pelvic examinations to screen for cervical cancer are no longer recommended for nonpregnant women who are considered low risk for cancer of the pelvic organs (ovaries, uterus, and vagina) and who do not have symptoms. A pelvic examination may be necessary if you have symptoms including those associated with pelvic infections. Ask your health care provider if a screening pelvic exam is right for you.   The Pap test is the screening test for cervical cancer for women who are considered at risk.  If you had a hysterectomy for a problem that was not cancer or a condition that could lead to cancer, then you no longer need Pap tests.  If you are older than 65 years, and you have had normal Pap tests for the past 10 years, you no longer need to have Pap tests.  If you have had past treatment for cervical cancer or a condition that could lead to cancer, you need Pap tests and screening for cancer for at least 20 years after your treatment.  If you no longer get a Pap test, assess your risk factors if they change (such as having a new sexual partner). This can affect whether you should start being screened again.  Some women have medical problems that increase their chance of getting cervical cancer. If this is the case for you, your health care provider may recommend more frequent screening and Pap tests.  The human papillomavirus (HPV) test is another test that may be used for cervical cancer screening. The HPV test looks for the virus that can cause cell changes in the cervix. The cells collected during the Pap test can be tested for HPV.  The HPV test can be used to screen women 48 years of age and older. Getting tested for HPV can extend the interval between normal Pap tests from three to five years.  An HPV test also should be used to screen women of any age who have unclear Pap test results.  After 79 years of age, women should have HPV testing as often as Pap  tests.  Colorectal Cancer  This type of cancer can be detected and often prevented.  Routine colorectal cancer screening usually begins at 79 years of age and continues through 79 years of age.  Your health care provider may recommend screening at an earlier age if you have risk factors for colon cancer.  Your health care provider may also recommend using home test kits to check for hidden blood in the stool.  A small camera at the end of a tube can be used to examine your colon directly (sigmoidoscopy or colonoscopy). This is done to check for the earliest forms of colorectal cancer.  Routine screening usually begins at age 57.  Direct examination of the colon should be repeated every 5-10 years through 79 years of age. However, you may need to be screened more often if early forms of precancerous polyps  or small growths are found. Skin Cancer  Check your skin from head to toe regularly.  Tell your health care provider about any new moles or changes in moles, especially if there is a change in a mole's shape or color.  Also tell your health care provider if you have a mole that is larger than the size of a pencil eraser.  Always use sunscreen. Apply sunscreen liberally and repeatedly throughout the day.  Protect yourself by wearing long sleeves, pants, a wide-brimmed hat, and sunglasses whenever you are outside. HEART DISEASE, DIABETES, AND HIGH BLOOD PRESSURE   Have your blood pressure checked at least every 1-2 years. High blood pressure causes heart disease and increases the risk of stroke.  If you are between 9 years and 37 years old, ask your health care provider if you should take aspirin to prevent strokes.  Have regular diabetes screenings. This involves taking a blood sample to check your fasting blood sugar level.  If you are at a normal weight and have a low risk for diabetes, have this test once every three years after 79 years of age.  If you are overweight and  have a high risk for diabetes, consider being tested at a younger age or more often. PREVENTING INFECTION  Hepatitis B  If you have a higher risk for hepatitis B, you should be screened for this virus. You are considered at high risk for hepatitis B if:  You were born in a country where hepatitis B is common. Ask your health care provider which countries are considered high risk.  Your parents were born in a high-risk country, and you have not been immunized against hepatitis B (hepatitis B vaccine).  You have HIV or AIDS.  You use needles to inject street drugs.  You live with someone who has hepatitis B.  You have had sex with someone who has hepatitis B.  You get hemodialysis treatment.  You take certain medicines for conditions, including cancer, organ transplantation, and autoimmune conditions. Hepatitis C  Blood testing is recommended for:  Everyone born from [birth year] through 1963.  Anyone with known risk factors for hepatitis C. Sexually transmitted infections (STIs)  You should be screened for sexually transmitted infections (STIs) including gonorrhea and chlamydia if:  You are sexually active and are younger than 79 years of age.  You are older than 79 years of age and your health care provider tells you that you are at risk for this type of infection.  Your sexual activity has changed since you were last screened and you are at an increased risk for chlamydia or gonorrhea. Ask your health care provider if you are at risk.  If you do not have HIV, but are at risk, it may be recommended that you take a prescription medicine daily to prevent HIV infection. This is called pre-exposure prophylaxis (PrEP). You are considered at risk if:  You are sexually active and do not regularly use condoms or know the HIV status of your partner(s).  You take drugs by injection.  You are sexually active with a partner who has HIV. Talk with your health care provider about whether you  are at high risk of being infected with HIV. If you choose to begin PrEP, you should first be tested for HIV. You should then be tested every 3 months for as long as you are taking PrEP.  PREGNANCY   If you are premenopausal and you may become pregnant, ask your health care provider about  preconception counseling.  If you may become pregnant, take 400 to 800 micrograms (mcg) of folic acid every day.  If you want to prevent pregnancy, talk to your health care provider about birth control (contraception). OSTEOPOROSIS AND MENOPAUSE   Osteoporosis is a disease in which the bones lose minerals and strength with aging. This can result in serious bone fractures. Your risk for osteoporosis can be identified using a bone density scan.  If you are 3 years of age or older, or if you are at risk for osteoporosis and fractures, ask your health care provider if you should be screened.  Ask your health care provider whether you should take a calcium or vitamin D supplement to lower your risk for osteoporosis.  Menopause may have certain physical symptoms and risks.  Hormone replacement therapy may reduce some of these symptoms and risks. Talk to your health care provider about whether hormone replacement therapy is right for you.  HOME CARE INSTRUCTIONS   Schedule regular health, dental, and eye exams.  Stay current with your immunizations.   Do not use any tobacco products including cigarettes, chewing tobacco, or electronic cigarettes.  If you are pregnant, do not drink alcohol.  If you are breastfeeding, limit how much and how often you drink alcohol.  Limit alcohol intake to no more than 1 drink per day for nonpregnant women. One drink equals 12 ounces of beer, 5 ounces of wine, or 1 ounces of hard liquor.  Do not use street drugs.  Do not share needles.  Ask your health care provider for help if you need support or information about quitting drugs.  Tell your health care provider if  you often feel depressed.  Tell your health care provider if you have ever been abused or do not feel safe at home. Document Released: 06/02/2011 Document Revised: 04/03/2014 Document Reviewed: 10/19/2013 [facility] Patient Information 2015 [place], [place]. This information is not intended to replace advice given to you by your health care provider. Make sure you discuss any questions you have with your health care provider.

## 2015-08-15 ENCOUNTER — Encounter: Payer: Self-pay | Admitting: Family

## 2015-08-16 ENCOUNTER — Other Ambulatory Visit: Payer: Self-pay | Admitting: Radiology

## 2015-08-16 NOTE — Progress Notes (Signed)
   Subjective:    Patient ID: Jamie [name], female    DOB: [birth year], 81 y.o.   MRN: [id number]  HPI The patient is an 81 YO coming in for follow up of her medical problems including: hypertension (taking lasix, valsartan, BP at goal, no known complications), her GERD (controlled with zantac, rarely uses phenergan for nausea, previously complicated by PUD), hyperlipidemia (taking lipitor 20 mg daily, no complications, no side effects). No new complaints at this time.  PMH, [facility], social history reviewed and updated at visit.   Review of Systems  Constitutional: Negative for activity change, appetite change and unexpected weight change.  HENT: Negative for congestion, postnasal drip, rhinorrhea, sinus pressure, sneezing and sore throat.   Respiratory: Negative for cough, chest tightness, shortness of breath and wheezing.   Cardiovascular: Negative for chest pain, palpitations and leg swelling.  Gastrointestinal: Negative for abdominal pain, diarrhea, constipation and abdominal distention.  Musculoskeletal: Positive for arthralgias. Negative for back pain and gait problem.  Skin: Negative.   Neurological: Negative for dizziness, weakness, light-headedness and headaches.  Psychiatric/Behavioral: Negative.       Objective:   Physical Exam  Constitutional: She is oriented to person, place, and time. She appears well-developed and well-nourished. No distress.  HENT:  Head: Normocephalic and atraumatic.  Nose: Nose normal.  Mouth/Throat: Oropharynx is clear and moist.  Eyes: EOM are normal.  Neck: Normal range of motion. Neck supple. No JVD present. No thyromegaly present.  Cardiovascular: Normal rate and regular rhythm.   Mild systolic ejection murmur at left sternal border.   Pulmonary/Chest: Effort normal and breath sounds normal.  Abdominal: Soft. Bowel sounds are normal. She exhibits no distension. There is no tenderness. There is no rebound.  Musculoskeletal: Normal range of  motion. She exhibits no edema or tenderness.  Neurological: She is alert and oriented to person, place, and time. No cranial nerve deficit.  Skin: Skin is warm and dry. She is not diaphoretic.   Filed Vitals:   08/14/15 0955  BP: 138/62  Pulse: 77  Temp: 98.7 F (37.1 C)  TempSrc: Oral  Resp: 16  Height: 5\' 1"  (1.549 m)  Weight: 164 lb (74.39 kg)  SpO2: 96%      Assessment & Plan:  Flu shot given at visit.

## 2015-08-16 NOTE — Assessment & Plan Note (Signed)
BP at goal on lasix and valsartan. Checking CMP and adjust dosing as needed. No side effects.

## 2015-08-16 NOTE — Assessment & Plan Note (Signed)
Controlled on zantac, previous history of PUD. Would continue for now. Adjust as needed for symptoms in the future.

## 2015-08-16 NOTE — Assessment & Plan Note (Signed)
Checking lipid panel and adjust therapy as needed. Currently on lipitor 20 mg daily without side effects.

## 2015-08-17 ENCOUNTER — Encounter (HOSPITAL_COMMUNITY): Payer: Self-pay | Admitting: Emergency Medicine

## 2015-08-17 ENCOUNTER — Emergency Department (HOSPITAL_COMMUNITY)
Admission: EM | Admit: 2015-08-17 | Discharge: 2015-08-17 | Disposition: A | Discharge status: Home / Self Care | Payer: Medicare PPO | Attending: Emergency Medicine | Admitting: Emergency Medicine

## 2015-08-17 DIAGNOSIS — H905 Unspecified sensorineural hearing loss: Secondary | ICD-10-CM | POA: Insufficient documentation

## 2015-08-17 DIAGNOSIS — Z8669 Personal history of other diseases of the nervous system and sense organs: Secondary | ICD-10-CM | POA: Diagnosis not present

## 2015-08-17 DIAGNOSIS — R21 Rash and other nonspecific skin eruption: Secondary | ICD-10-CM | POA: Diagnosis not present

## 2015-08-17 DIAGNOSIS — Z86010 Personal history of colonic polyps: Secondary | ICD-10-CM | POA: Diagnosis not present

## 2015-08-17 DIAGNOSIS — Z872 Personal history of diseases of the skin and subcutaneous tissue: Secondary | ICD-10-CM | POA: Insufficient documentation

## 2015-08-17 DIAGNOSIS — Z87891 Personal history of nicotine dependence: Secondary | ICD-10-CM | POA: Insufficient documentation

## 2015-08-17 DIAGNOSIS — Z8719 Personal history of other diseases of the digestive system: Secondary | ICD-10-CM | POA: Insufficient documentation

## 2015-08-17 DIAGNOSIS — Z8619 Personal history of other infectious and parasitic diseases: Secondary | ICD-10-CM | POA: Diagnosis not present

## 2015-08-17 DIAGNOSIS — Z8739 Personal history of other diseases of the musculoskeletal system and connective tissue: Secondary | ICD-10-CM | POA: Insufficient documentation

## 2015-08-17 DIAGNOSIS — Z8709 Personal history of other diseases of the respiratory system: Secondary | ICD-10-CM | POA: Diagnosis not present

## 2015-08-17 DIAGNOSIS — E785 Hyperlipidemia, unspecified: Secondary | ICD-10-CM | POA: Diagnosis not present

## 2015-08-17 DIAGNOSIS — Z79899 Other long term (current) drug therapy: Secondary | ICD-10-CM | POA: Diagnosis not present

## 2015-08-17 DIAGNOSIS — I10 Essential (primary) hypertension: Secondary | ICD-10-CM | POA: Insufficient documentation

## 2015-08-17 DIAGNOSIS — L298 Other pruritus: Secondary | ICD-10-CM | POA: Diagnosis present

## 2015-08-17 DIAGNOSIS — Z862 Personal history of diseases of the blood and blood-forming organs and certain disorders involving the immune mechanism: Secondary | ICD-10-CM | POA: Insufficient documentation

## 2015-08-17 MED ORDER — DIPHENHYDRAMINE HCL 25 MG PO CAPS
25.0000 mg | ORAL_CAPSULE | Freq: Once | ORAL | Status: AC
  Administered 2015-08-17: 25 mg via ORAL
  Filled 2015-08-17: qty 1

## 2015-08-17 NOTE — Discharge Instructions (Signed)
If you were given medicines take as directed.  If you are on coumadin or contraceptives realize their levels and effectiveness is altered by many different medicines.  If you have any reaction (rash, tongues swelling, other) to the medicines stop taking and see a physician.    If your blood pressure was elevated in the ER make sure you follow up for management with a primary doctor or return for chest pain, shortness of breath or stroke symptoms.  Please follow up as directed and return to the ER or see a physician for new or worsening symptoms.  Thank you. Filed Vitals:   08/17/15 1741  BP: 155/68  Pulse: 75  Temp: 98.4 F (36.9 C)  TempSrc: Oral  Resp: 16  Height: $Remove'5\' 1"'lOIGcCV$  (1.549 m)  Weight: 164 lb (74.39 kg)  SpO2: 100%    Allergies  Allergies may happen from anything your body is sensitive to. This may be food, medicines, pollens, chemicals, and many other things. Food allergies can be severe and deadly.  HOME CARE  If you do not know what causes a reaction, keep a diary. Write down the foods you ate and the symptoms that followed. Avoid foods that cause reactions.  If you have red raised spots (hives) or a rash:  Take medicine as told by your doctor.  Use medicines for red raised spots and itching as needed.  Apply cold cloths (compresses) to the skin. Take a cool bath. Avoid hot baths or showers.  If you are severely allergic:  It is often necessary to go to the hospital after you have treated your reaction.  Wear your medical alert jewelry.  You and your family must learn how to give a allergy shot or use an allergy kit (anaphylaxis kit).  Always carry your allergy kit or shot with you. Use this medicine as told by your doctor if a severe reaction is occurring. GET HELP RIGHT AWAY IF:  You have trouble breathing or are making high-pitched whistling sounds (wheezing).  You have a tight feeling in your chest or throat.  You have a puffy (swollen) mouth.  You have  red raised spots, puffiness (swelling), or itching all over your body.  You have had a severe reaction that was helped by your allergy kit or shot. The reaction can return once the medicine has worn off.  You think you are having a food allergy. Symptoms most often happen within 30 minutes of eating a food.  Your symptoms have not gone away within 2 days or are getting worse.  You have new symptoms.  You want to retest yourself with a food or drink you think causes an allergic reaction. Only do this under the care of a doctor. MAKE SURE YOU:   Understand these instructions.  Will watch your condition.  Will get help right away if you are not doing well or get worse. Document Released: 03/14/2013 Document Reviewed: 03/14/2013 [facility] Patient Information 2015 [place]. This information is not intended to replace advice given to you by your health care provider. Make sure you discuss any questions you have with your health care provider.

## 2015-08-17 NOTE — ED Notes (Addendum)
Patient states she had a biopsy of her right breast yesterday. Patient states she started itching today at 1400. Patient's skin appears reddened on forearms, back, back of neck, and thighs.

## 2015-08-18 ENCOUNTER — Ambulatory Visit (INDEPENDENT_AMBULATORY_CARE_PROVIDER_SITE_OTHER): Payer: Medicare PPO | Admitting: Family Medicine

## 2015-08-18 ENCOUNTER — Encounter: Payer: Self-pay | Admitting: Family Medicine

## 2015-08-18 VITALS — BP 122/70 | HR 83 | Temp 98.1°F | Resp 18 | Ht 61.0 in | Wt 164.2 lb

## 2015-08-18 DIAGNOSIS — T7840XA Allergy, unspecified, initial encounter: Secondary | ICD-10-CM | POA: Diagnosis not present

## 2015-08-18 MED ORDER — TRIAMCINOLONE ACETONIDE 0.5 % EX CREA: 1.0000 "application " | TOPICAL_CREAM | Freq: Two times a day (BID) | CUTANEOUS | Status: DC

## 2015-08-18 MED ORDER — METHYLPREDNISOLONE ACETATE 80 MG/ML IJ SUSP
80.0000 mg | Freq: Once | INTRAMUSCULAR | Status: AC
  Administered 2015-08-18: 80 mg via INTRAMUSCULAR

## 2015-08-18 NOTE — Progress Notes (Signed)
[name] [address] [place], [place] [address] Phone: [phone] Subjective:      CC: Swelling and hives  [id number] Jamie [name] is a 81 y.o. female coming in with complaint of swelling and hives. Patient was seen at the breast Center on Thursday and did have a biopsy. Patient states unfortunately been starting yesterday she started having hives all over her body with swelling and redness patient was significantly HEP patient went into the emergency room and patient was to do Benadryl. Patient denies any new animals, any new foods, or any new medications. Patient states that the Benadryl was very helpful. Denies any fevers or chills or any abnormal weight loss. Patient states that she is still little bit itchy that the redness has improved on her body.     Past Medical History  Diagnosis Date  . ANEMIA, IRON DEFICIENCY 09/10/2007  . CATARACT EXTRACTION, HX OF 09/10/2007  . DYSPEPSIA, HX OF 09/10/2007  . HAIR LOSS 03/21/2009  . HAY FEVER 09/10/2007  . HEARING LOSS, SENSORINEURAL 09/10/2007  . HYPERLIPIDEMIA 09/10/2007  . HYPERTENSION 09/10/2007  . LOW BACK PAIN 05/10/2008  . Pain in joint, hand 03/01/2008  . PEPTIC ULCER DISEASE 09/10/2007  . POLYP, COLON 09/10/2007  . POLYPECTOMY, HX OF 09/10/2007  . SHINGLES 08/13/2009  . SHOULDER PAIN, LEFT 02/12/2008  . TAH/BSO, HX OF 09/10/2007   Past Surgical History  Procedure Laterality Date  . Polypectomy    . Cataract extraction    . Irrigation and debridement sebaceous cyst    . Total abdominal hysterectomy w/ bilateral salpingoophorectomy     Social History  Substance Use Topics  . Smoking status: Former Smoker -- 0.20 packs/day for 25 years    Types: Cigarettes    Quit date: 12/01/1989  . Smokeless tobacco: None     Comment: smoked a long time ago; Quit 90 or 91/ number is approximate  . Alcohol Use: No   No Known Allergies Family History  Problem Relation Age of Onset  .  Hypertension Mother   . Coronary artery disease Mother   . Heart disease Mother     CHF  . Cancer Neg Hx     breast or colon  . Diabetes Neg Hx      Past medical history, social, surgical and family history all reviewed in electronic medical record.   Review of Systems: No headache, visual changes, nausea, vomiting, diarrhea, constipation, dizziness, abdominal pain, skin rash, fevers, chills, night sweats, weight loss, swollen lymph nodes, body aches, joint swelling, muscle aches, chest pain, shortness of breath, mood changes.   Objective Blood pressure 122/70, pulse 83, temperature 98.1 F (36.7 C), temperature source Oral, resp. rate 18, height 5\' 1"  (1.549 m), weight 164 lb 4 oz (74.503 kg), SpO2 97 %.  General: No apparent distress alert and oriented x3 mood and affect normal, dressed appropriately.  HEENT: Pupils equal, extraocular movements intact  Respiratory: Patient's speak in full sentences and does not appear short of breath  Cardiovascular: No lower extremity edema, non tender, no erythema  Skin: Mild erythema of the right clavicle area that has very minimal macular change, mild warmth. Patient's area of breast biopsy on the right side has no erythema or redness. Nontender on exam. Patient has hands and her palms do have significant redness patient does have some exfoliation of the forearms bilaterally. No signs of true infection. No discharge from any areas. Abdomen: Soft nontender  Neuro: Cranial nerves II through XII  are intact, neurovascularly intact in all extremities with 2+ DTRs and 2+ pulses.  Lymph: No lymphadenopathy of posterior or anterior cervical chain or axillae bilaterally.  Gait normal with good balance and coordination.  MSK:  Non tender with full range of motion and good stability and symmetric strength and tone of shoulders, elbows, wrist, hip, knee and ankles bilaterally. Moderate osteoarthritic changes of multiple joints.     Impression and  Recommendations:     This case required medical decision making of moderate complexity.

## 2015-08-18 NOTE — Patient Instructions (Addendum)
Good to see you Benedryl 3 times daily as needed for itching Try creme I sent in s directed.  If any fever or chills or worsening symptoms call or go to emergency room Follow up with [facility] in next week.   Hives Hives are itchy, red, swollen areas of the skin. They can vary in size and location on your body. Hives can come and go for hours or several days (acute hives) or for several weeks (chronic hives). Hives do not spread from person to person (noncontagious). They may get worse with scratching, exercise, and emotional stress. CAUSES   Allergic reaction to food, additives, or drugs.  Infections, including the common cold.  Illness, such as vasculitis, lupus, or thyroid disease.  Exposure to sunlight, heat, or cold.  Exercise.  Stress.  Contact with chemicals. SYMPTOMS   Red or white swollen patches on the skin. The patches may change size, shape, and location quickly and repeatedly.  Itching.  Swelling of the hands, feet, and face. This may occur if hives develop deeper in the skin. DIAGNOSIS  Your caregiver can usually tell what is wrong by performing a physical exam. Skin or blood tests may also be done to determine the cause of your hives. In some cases, the cause cannot be determined. TREATMENT  Mild cases usually get better with medicines such as antihistamines. Severe cases may require an emergency epinephrine injection. If the cause of your hives is known, treatment includes avoiding that trigger.  HOME CARE INSTRUCTIONS   Avoid causes that trigger your hives.  Take antihistamines as directed by your caregiver to reduce the severity of your hives. Non-sedating or low-sedating antihistamines are usually recommended. Do not drive while taking an antihistamine.  Take any other medicines prescribed for itching as directed by your caregiver.  Wear loose-fitting clothing.  Keep all follow-up appointments as directed by your caregiver. SEEK MEDICAL CARE IF:   You  have persistent or severe itching that is not relieved with medicine.  You have painful or swollen joints. SEEK IMMEDIATE MEDICAL CARE IF:   You have a fever.  Your tongue or lips are swollen.  You have trouble breathing or swallowing.  You feel tightness in the throat or chest.  You have abdominal pain. These problems may be the first sign of a life-threatening allergic reaction. Call your local emergency services (911 in [place] MAKE SURE YOU:   Understand these instructions.  Will watch your condition.  Will get help right away if you are not doing well or get worse. Document Released: 11/17/2005 Document Revised: 11/22/2013 Document Reviewed: 02/10/2012 [facility] Patient Information 2015 [place], [place]. This information is not intended to replace advice given to you by your health care provider. Make sure you discuss any questions you have with your health care provider.

## 2015-08-18 NOTE — Progress Notes (Signed)
Pre-visit discussion using our clinic review tool. No additional management support is needed unless otherwise documented below in the visit note.  

## 2015-08-18 NOTE — Assessment & Plan Note (Signed)
Patient I do think is having some type of allergic reaction versus stress related hives. Patient is concerned about the biopsy. I do not see any signs of an infectious etiology at this time the patient will call if there is any fevers chills or any abnormal weight loss. Discussed with her that I'm on-call and would call it in and about it if necessary. Patient though was given a prescription for a topical steroid to help with the itching. Discuss it can take Benadryl if needed. We discussed trying to avoid the itching to avoid any secondary bacterial infections if possible. Patient will monitor and see if there is any other type of trigger second of cause this. Patient will follow-up with primary care and one week.  Spent  25 minutes with patient face-to-face and had greater than 50% of counseling including as described above in assessment and plan.

## 2015-08-19 NOTE — ED Provider Notes (Signed)
CSN: [id number]     Arrival date & time 08/17/15  1722 History   First MD Initiated Contact with Patient 08/17/15 2016     Chief Complaint  Patient presents with  . Pruritis     (Consider location/radiation/quality/duration/timing/severity/associated sxs/prior Treatment) HPI Comments: 81 year old female presents with pruritic rash since yesterday when she had a breast biopsy done. No altercations with biopsy, patient denies any new medications since then. Patient had itching starting at 2:00 today because it worse with diffuse hive-like rash. Rash is improved. No breathing difficulty or tongue swelling no other new exposures known. No fevers or chills.  The history is provided by the patient.    Past Medical History  Diagnosis Date  . ANEMIA, IRON DEFICIENCY 09/10/2007  . CATARACT EXTRACTION, HX OF 09/10/2007  . DYSPEPSIA, HX OF 09/10/2007  . HAIR LOSS 03/21/2009  . HAY FEVER 09/10/2007  . HEARING LOSS, SENSORINEURAL 09/10/2007  . HYPERLIPIDEMIA 09/10/2007  . HYPERTENSION 09/10/2007  . LOW BACK PAIN 05/10/2008  . Pain in joint, hand 03/01/2008  . PEPTIC ULCER DISEASE 09/10/2007  . POLYP, COLON 09/10/2007  . POLYPECTOMY, HX OF 09/10/2007  . SHINGLES 08/13/2009  . SHOULDER PAIN, LEFT 02/12/2008  . TAH/BSO, HX OF 09/10/2007   Past Surgical History  Procedure Laterality Date  . Polypectomy    . Cataract extraction    . Irrigation and debridement sebaceous cyst    . Total abdominal hysterectomy w/ bilateral salpingoophorectomy     Family History  Problem Relation Age of Onset  . Hypertension Mother   . Coronary artery disease Mother   . Heart disease Mother     CHF  . Cancer Neg Hx     breast or colon  . Diabetes Neg Hx    Social History  Substance Use Topics  . Smoking status: Former Smoker -- 0.20 packs/day for 25 years    Types: Cigarettes    Quit date: 12/01/1989  . Smokeless tobacco: None     Comment: smoked a long time ago; Quit 90 or 91/ number is approximate  .  Alcohol Use: No   OB History    No data available     Review of Systems  Constitutional: Negative for fever and chills.  Respiratory: Negative for shortness of breath.   Gastrointestinal: Negative for vomiting and abdominal pain.  Genitourinary: Negative for dysuria and flank pain.  Musculoskeletal: Negative for back pain, neck pain and neck stiffness.  Skin: Positive for rash.  Neurological: Negative for light-headedness and headaches.      Allergies  Review of patient's allergies indicates no known allergies.  Home Medications   Prior to Admission medications   Medication Sig Start Date End Date Taking? Authorizing Retta Pitcher  acetaminophen (TYLENOL) 500 MG tablet Take 1,000 mg by mouth every 6 (six) hours as needed for mild pain.    Yes Historical Yanett Conkright, MD  ALPRAZolam Duanne Moron) 0.5 MG tablet Take 1 tablet (0.5 mg total) by mouth 3 (three) times daily as needed for anxiety. 11/29/14  Yes Golden Circle, FNP  alum & mag hydroxide-simeth (MYLANTA) 200-200-20 MG/5ML suspension Take 30 mLs by mouth daily as needed for indigestion or heartburn.    Yes Historical Jeannett Dekoning, MD  atorvastatin (LIPITOR) 20 MG tablet Take 1 tablet (20 mg total) by mouth daily. 05/25/15  Yes Golden Circle, FNP  cholecalciferol (VITAMIN D) 400 UNITS TABS tablet Take 2,000 Units by mouth.   Yes Historical Oneisha Ammons, MD  clotrimazole-betamethasone (LOTRISONE) cream Apply 1 application topically 2 (two)  times daily as needed (rash). 09/29/14  Yes Olga Millers, MD  furosemide (LASIX) 40 MG tablet TAKE 1 TABLET (40 MG TOTAL) BY MOUTH DAILY. 06/26/15  Yes Golden Circle, FNP  HYDROcodone-homatropine (HYDROMET) 5-1.5 MG/5ML syrup Take 5 mLs by mouth every 6 (six) hours as needed for cough. Patient not taking: Reported on 08/18/2015 05/18/15  Yes Hendricks Limes, MD  methylcellulose (CITRUCEL) oral powder Take 1 packet by mouth daily as needed (constipation).    Yes Historical Nadea Kirkland, MD  promethazine  (PHENERGAN) 12.5 MG tablet Take 1 tablet (12.5 mg total) by mouth every 8 (eight) hours as needed for nausea or vomiting. 08/14/15  Yes Olga Millers, MD  ranitidine (ZANTAC) 150 MG tablet Take 150 mg by mouth 2 (two) times daily.   Yes Historical Landi Biscardi, MD  valsartan (DIOVAN) 320 MG tablet TAKE 1 TABLET BY MOUTH DAILY 06/29/15  Yes Golden Circle, FNP  triamcinolone cream (KENALOG) 0.5 % Apply 1 application topically 2 (two) times daily. To affected areas. 08/18/15   Lyndal Pulley, DO   BP 111/42 mmHg  Pulse 72  Temp(Src) 98.4 F (36.9 C) (Oral)  Resp 14  Ht 5\' 1"  (1.549 m)  Wt 164 lb (74.39 kg)  BMI 31.00 kg/m2  SpO2 99% Physical Exam  Constitutional: She is oriented to person, place, and time. She appears well-developed and well-nourished.  HENT:  Head: Normocephalic and atraumatic.  Mouth/Throat: Oropharyngeal exudate: no angioedema.  Eyes: Conjunctivae are normal. Right eye exhibits no discharge. Left eye exhibits no discharge.  Neck: Normal range of motion. Neck supple. No tracheal deviation present.  Cardiovascular: Normal rate.   Pulmonary/Chest: Effort normal and breath sounds normal.  Abdominal: Soft. She exhibits no distension. There is no tenderness. There is no guarding.  Musculoskeletal: She exhibits no edema.  Neurological: She is alert and oriented to person, place, and time.  Skin: Skin is warm. Rash (very mild hives on dorsal forearms resolving) noted. Pallor: breast biopsy site no sign of infection.  Psychiatric: She has a normal mood and affect.  Nursing note and vitals reviewed.   ED Course  Procedures (including critical care time) Labs Review Labs Reviewed - No data to display  Imaging Review No results found. I have personally reviewed and evaluated these images and lab results as part of my medical decision-making.   EKG Interpretation None      MDM   Final diagnoses:  Rash and nonspecific skin eruption   Patient presents with  allergic type rash. Symptoms and signs improving significantly, Benadryl improves symptoms. Discussed reasons to return in follow-up.  Results and differential diagnosis were discussed with the patient/parent/guardian. Xrays were independently reviewed by myself.  Close follow up outpatient was discussed, comfortable with the plan.   Medications  diphenhydrAMINE (BENADRYL) capsule 25 mg (25 mg Oral Given 08/17/15 1751)    Filed Vitals:   08/17/15 1741 08/17/15 2119  BP: 155/68 111/42  Pulse: 75 72  Temp: 98.4 F (36.9 C)   TempSrc: Oral   Resp: 16 14  Height: 5\' 1"  (1.549 m)   Weight: 164 lb (74.39 kg)   SpO2: 100% 99%    Final diagnoses:  Rash and nonspecific skin eruption       Elnora Morrison, MD 08/19/15 0028

## 2015-08-24 ENCOUNTER — Encounter: Payer: Self-pay | Admitting: Family

## 2015-08-24 ENCOUNTER — Ambulatory Visit (INDEPENDENT_AMBULATORY_CARE_PROVIDER_SITE_OTHER): Payer: Medicare PPO | Admitting: Family

## 2015-08-24 VITALS — BP 158/70 | HR 63 | Temp 98.2°F | Resp 18 | Ht 61.0 in | Wt 164.0 lb

## 2015-08-24 DIAGNOSIS — T7840XD Allergy, unspecified, subsequent encounter: Secondary | ICD-10-CM | POA: Diagnosis not present

## 2015-08-24 NOTE — Progress Notes (Signed)
Pre visit review using our clinic review tool, if applicable. No additional management support is needed unless otherwise documented below in the visit note. 

## 2015-08-24 NOTE — Progress Notes (Signed)
Subjective:    Patient ID: Jamie [name], female    DOB: 01-Dec-1933, 81 y.o.   MRN: [id number]  Chief Complaint  Patient presents with  . Follow-up    Pt was seen in [facility] due to hives and itching, she states that she is feeling alot better and the hives have gone away    HPI:  Jamie [name] is a 81 y.o. female who  has a past medical history of ANEMIA, IRON DEFICIENCY (09/10/2007); CATARACT EXTRACTION, HX OF (09/10/2007); DYSPEPSIA, HX OF (09/10/2007); HAIR LOSS (03/21/2009); HAY FEVER (09/10/2007); HEARING LOSS, SENSORINEURAL (09/10/2007); HYPERLIPIDEMIA (09/10/2007); HYPERTENSION (09/10/2007); LOW BACK PAIN (05/10/2008); Pain in joint, hand (03/01/2008); PEPTIC ULCER DISEASE (09/10/2007); POLYP, COLON (09/10/2007); POLYPECTOMY, HX OF (09/10/2007); SHINGLES (08/13/2009); SHOULDER PAIN, LEFT (02/12/2008); and TAH/BSO, HX OF (09/10/2007). and presents today for a follow up office visit.   Previously seen at [facility] for a breast biopsy and began having an allergic reaction to something and described itching and redness all over. She was also seen in the ED for this and was prescribed Benedryl. She followed up for an office visit during the Saturday clinic and continued to have redness and itching of her arms and chest. She was treated with an in office injection of DepoMedrol and triamcinalone cream. All office and ED notes have been reviewed in detail.   Reports for a follow up office today and notes that she has been feeling a lot better with no continued rash or itching. Denies any shortness of breath, tongue swelling or angioedema.    No Known Allergies   Current Outpatient Prescriptions on File Prior to Visit  Medication Sig Dispense Refill  . acetaminophen (TYLENOL) 500 MG tablet Take 1,000 mg by mouth every 6 (six) hours as needed for mild pain.     Marland Kitchen ALPRAZolam (XANAX) 0.5 MG tablet Take 1 tablet (0.5 mg total) by mouth 3 (three) times daily as needed for anxiety. 90  tablet 0  . alum & mag hydroxide-simeth (MYLANTA) 694-503-88 MG/5ML suspension Take 30 mLs by mouth daily as needed for indigestion or heartburn.     Marland Kitchen atorvastatin (LIPITOR) 20 MG tablet Take 1 tablet (20 mg total) by mouth daily. 90 tablet 3  . cholecalciferol (VITAMIN D) 400 UNITS TABS tablet Take 2,000 Units by mouth.    . clotrimazole-betamethasone (LOTRISONE) cream Apply 1 application topically 2 (two) times daily as needed (rash). 30 g 3  . furosemide (LASIX) 40 MG tablet TAKE 1 TABLET (40 MG TOTAL) BY MOUTH DAILY. 90 tablet 1  . methylcellulose (CITRUCEL) oral powder Take 1 packet by mouth daily as needed (constipation).     . promethazine (PHENERGAN) 12.5 MG tablet Take 1 tablet (12.5 mg total) by mouth every 8 (eight) hours as needed for nausea or vomiting. 20 tablet 0  . ranitidine (ZANTAC) 150 MG tablet Take 150 mg by mouth 2 (two) times daily.    Marland Kitchen triamcinolone cream (KENALOG) 0.5 % Apply 1 application topically 2 (two) times daily. To affected areas. 30 g 3  . valsartan (DIOVAN) 320 MG tablet TAKE 1 TABLET BY MOUTH DAILY 90 tablet 1   No current facility-administered medications on file prior to visit.     Review of Systems  Constitutional: Negative for fever and chills.  Respiratory: Negative for chest tightness and shortness of breath.   Cardiovascular: Negative for chest pain, palpitations and leg swelling.      Objective:    BP 158/70 mmHg  Pulse 63  Temp(Src)  98.2 F (36.8 C) (Oral)  Resp 18  Ht 5\' 1"  (1.549 m)  Wt 164 lb (74.39 kg)  BMI 31.00 kg/m2  SpO2 97% Nursing note and vital signs reviewed.  Physical Exam  Constitutional: She is oriented to person, place, and time. She appears well-developed and well-nourished. No distress.  Cardiovascular: Normal rate, regular rhythm, normal heart sounds and intact distal pulses.   Pulmonary/Chest: Effort normal and breath sounds normal.  Neurological: She is alert and oriented to person, place, and time.  Skin: Skin  is warm and dry.  Psychiatric: She has a normal mood and affect. Her behavior is normal. Judgment and thought content normal.       Assessment & Plan:   Problem List Items Addressed This Visit      Other   Allergic reaction - Primary    Symptoms of allergic reaction are solved with no residual effects and no significant cause identifiable. Follow up if symptoms return.

## 2015-08-24 NOTE — Assessment & Plan Note (Signed)
Symptoms of allergic reaction are solved with no residual effects and no significant cause identifiable. Follow up if symptoms return.

## 2015-08-24 NOTE — Patient Instructions (Signed)
Thank you for choosing [facility].  Summary/Instructions:  Follow up as needed.  If your symptoms worsen or fail to improve, please contact our office for further instruction, or in case of emergency go directly to the emergency room at the closest medical facility.

## 2015-09-18 ENCOUNTER — Encounter: Payer: Self-pay | Admitting: Internal Medicine

## 2015-09-30 ENCOUNTER — Other Ambulatory Visit: Payer: Self-pay | Admitting: Family

## 2015-10-03 ENCOUNTER — Other Ambulatory Visit: Payer: Self-pay | Admitting: Internal Medicine

## 2015-10-03 ENCOUNTER — Encounter: Payer: Self-pay | Admitting: Internal Medicine

## 2015-10-23 ENCOUNTER — Emergency Department (HOSPITAL_COMMUNITY)
Admission: EM | Admit: 2015-10-23 | Discharge: 2015-10-23 | Disposition: A | Discharge status: Home / Self Care | Payer: Medicare PPO | Attending: Emergency Medicine | Admitting: Emergency Medicine

## 2015-10-23 ENCOUNTER — Encounter (HOSPITAL_COMMUNITY): Payer: Self-pay

## 2015-10-23 ENCOUNTER — Emergency Department (HOSPITAL_COMMUNITY): Payer: Medicare PPO

## 2015-10-23 DIAGNOSIS — R531 Weakness: Secondary | ICD-10-CM | POA: Diagnosis not present

## 2015-10-23 DIAGNOSIS — Z872 Personal history of diseases of the skin and subcutaneous tissue: Secondary | ICD-10-CM | POA: Insufficient documentation

## 2015-10-23 DIAGNOSIS — Z9849 Cataract extraction status, unspecified eye: Secondary | ICD-10-CM | POA: Diagnosis not present

## 2015-10-23 DIAGNOSIS — Z87891 Personal history of nicotine dependence: Secondary | ICD-10-CM | POA: Insufficient documentation

## 2015-10-23 DIAGNOSIS — I10 Essential (primary) hypertension: Secondary | ICD-10-CM | POA: Insufficient documentation

## 2015-10-23 DIAGNOSIS — Z86010 Personal history of colonic polyps: Secondary | ICD-10-CM | POA: Insufficient documentation

## 2015-10-23 DIAGNOSIS — Z862 Personal history of diseases of the blood and blood-forming organs and certain disorders involving the immune mechanism: Secondary | ICD-10-CM | POA: Diagnosis not present

## 2015-10-23 DIAGNOSIS — Z8669 Personal history of other diseases of the nervous system and sense organs: Secondary | ICD-10-CM | POA: Insufficient documentation

## 2015-10-23 DIAGNOSIS — E785 Hyperlipidemia, unspecified: Secondary | ICD-10-CM | POA: Diagnosis not present

## 2015-10-23 DIAGNOSIS — Z8619 Personal history of other infectious and parasitic diseases: Secondary | ICD-10-CM | POA: Diagnosis not present

## 2015-10-23 DIAGNOSIS — Z9049 Acquired absence of other specified parts of digestive tract: Secondary | ICD-10-CM | POA: Insufficient documentation

## 2015-10-23 DIAGNOSIS — Z79899 Other long term (current) drug therapy: Secondary | ICD-10-CM | POA: Insufficient documentation

## 2015-10-23 DIAGNOSIS — R1013 Epigastric pain: Secondary | ICD-10-CM | POA: Insufficient documentation

## 2015-10-23 DIAGNOSIS — Z8711 Personal history of peptic ulcer disease: Secondary | ICD-10-CM | POA: Insufficient documentation

## 2015-10-23 DIAGNOSIS — R110 Nausea: Secondary | ICD-10-CM | POA: Insufficient documentation

## 2015-10-23 DIAGNOSIS — Z91040 Latex allergy status: Secondary | ICD-10-CM | POA: Diagnosis not present

## 2015-10-23 LAB — URINALYSIS, ROUTINE W REFLEX MICROSCOPIC
GLUCOSE, UA: NEGATIVE mg/dL
KETONES UR: 15 mg/dL — AB
NITRITE: NEGATIVE
PROTEIN: 30 mg/dL — AB
Specific Gravity, Urine: 1.025 (ref 1.005–1.030)
pH: 6.5 (ref 5.0–8.0)

## 2015-10-23 LAB — COMPREHENSIVE METABOLIC PANEL
ALT: 17 U/L (ref 14–54)
AST: 22 U/L (ref 15–41)
Albumin: 4.1 g/dL (ref 3.5–5.0)
Alkaline Phosphatase: 153 U/L — ABNORMAL HIGH (ref 38–126)
Anion gap: 9 (ref 5–15)
BUN: 8 mg/dL (ref 6–20)
CHLORIDE: 108 mmol/L (ref 101–111)
CO2: 23 mmol/L (ref 22–32)
CREATININE: 0.89 mg/dL (ref 0.44–1.00)
Calcium: 9.3 mg/dL (ref 8.9–10.3)
GFR calc non Af Amer: 60 mL/min — ABNORMAL LOW (ref >60)
Glucose, Bld: 93 mg/dL (ref 65–99)
POTASSIUM: 3.8 mmol/L (ref 3.5–5.1)
SODIUM: 140 mmol/L (ref 135–145)
Total Bilirubin: 0.8 mg/dL (ref 0.3–1.2)
Total Protein: 7.4 g/dL (ref 6.5–8.1)

## 2015-10-23 LAB — I-STAT TROPONIN, ED
TROPONIN I, POC: 0 ng/mL (ref 0.00–0.08)
Troponin i, poc: 0.01 ng/mL (ref 0.00–0.08)

## 2015-10-23 LAB — I-STAT CHEM 8, ED
BUN: 8 mg/dL (ref 6–20)
CHLORIDE: 105 mmol/L (ref 101–111)
Calcium, Ion: 1.14 mmol/L (ref 1.13–1.30)
Creatinine, Ser: 0.9 mg/dL (ref 0.44–1.00)
GLUCOSE: 91 mg/dL (ref 65–99)
HEMATOCRIT: 43 % (ref 36.0–46.0)
HEMOGLOBIN: 14.6 g/dL (ref 12.0–15.0)
POTASSIUM: 3.8 mmol/L (ref 3.5–5.1)
SODIUM: 142 mmol/L (ref 135–145)
TCO2: 23 mmol/L (ref 0–100)

## 2015-10-23 LAB — CBC WITH DIFFERENTIAL/PLATELET
BASOS ABS: 0 10*3/uL (ref 0.0–0.1)
Basophils Relative: 0 %
EOS ABS: 0.1 10*3/uL (ref 0.0–0.7)
EOS PCT: 2 %
HCT: 42.2 % (ref 36.0–46.0)
Hemoglobin: 13.7 g/dL (ref 12.0–15.0)
Lymphocytes Relative: 27 %
Lymphs Abs: 1.4 10*3/uL (ref 0.7–4.0)
MCH: 31.6 pg (ref 26.0–34.0)
MCHC: 32.5 g/dL (ref 30.0–36.0)
MCV: 97.5 fL (ref 78.0–100.0)
Monocytes Absolute: 0.2 10*3/uL (ref 0.1–1.0)
Monocytes Relative: 4 %
Neutro Abs: 3.5 10*3/uL (ref 1.7–7.7)
Neutrophils Relative %: 67 %
PLATELETS: 171 10*3/uL (ref 150–400)
RBC: 4.33 MIL/uL (ref 3.87–5.11)
RDW: 13.5 % (ref 11.5–15.5)
WBC: 5.2 10*3/uL (ref 4.0–10.5)

## 2015-10-23 LAB — URINE MICROSCOPIC-ADD ON

## 2015-10-23 LAB — LIPASE, BLOOD: LIPASE: 25 U/L (ref 11–51)

## 2015-10-23 LAB — I-STAT CG4 LACTIC ACID, ED: Lactic Acid, Venous: 1.59 mmol/L (ref 0.5–2.0)

## 2015-10-23 MED ORDER — ONDANSETRON HCL 4 MG/2ML IJ SOLN
4.0000 mg | Freq: Once | INTRAMUSCULAR | Status: AC
  Administered 2015-10-23: 4 mg via INTRAVENOUS
  Filled 2015-10-23: qty 2

## 2015-10-23 MED ORDER — ASPIRIN 81 MG PO CHEW
324.0000 mg | CHEWABLE_TABLET | Freq: Once | ORAL | Status: AC
  Administered 2015-10-23: 324 mg via ORAL
  Filled 2015-10-23: qty 4

## 2015-10-23 MED ORDER — NITROGLYCERIN 0.4 MG SL SUBL: 0.4000 mg | SUBLINGUAL_TABLET | SUBLINGUAL | Status: DC | PRN

## 2015-10-23 MED ORDER — MORPHINE SULFATE (PF) 4 MG/ML IV SOLN
4.0000 mg | Freq: Once | INTRAVENOUS | Status: DC
  Filled 2015-10-23: qty 1

## 2015-10-23 MED ORDER — LIDOCAINE VISCOUS 2 % MT SOLN
15.0000 mL | Freq: Once | OROMUCOSAL | Status: AC
  Administered 2015-10-23: 15 mL via OROMUCOSAL
  Filled 2015-10-23: qty 15

## 2015-10-23 MED ORDER — SODIUM CHLORIDE 0.9 % IV SOLN
1000.0000 mL | Freq: Once | INTRAVENOUS | Status: AC
  Administered 2015-10-23: 1000 mL via INTRAVENOUS

## 2015-10-23 MED ORDER — IOHEXOL 300 MG/ML  SOLN
100.0000 mL | Freq: Once | INTRAMUSCULAR | Status: AC | PRN
  Administered 2015-10-23: 100 mL via INTRAVENOUS

## 2015-10-23 MED ORDER — ONDANSETRON 4 MG PO TBDP: 4.0000 mg | ORAL_TABLET | Freq: Three times a day (TID) | ORAL | Status: DC | PRN

## 2015-10-23 MED ORDER — ALUM & MAG HYDROXIDE-SIMETH 200-200-20 MG/5ML PO SUSP
15.0000 mL | Freq: Once | ORAL | Status: AC
Start: 2015-10-23 — End: 2015-10-23
  Administered 2015-10-23: 15 mL via ORAL
  Filled 2015-10-23: qty 30

## 2015-10-23 NOTE — ED Notes (Signed)
Pt began feeling nauseated w/ upper abd pain beginning yesterday afternoon. Denies v/d.

## 2015-10-23 NOTE — ED Notes (Signed)
Pt given water and crackers for PO challenge

## 2015-10-23 NOTE — Discharge Instructions (Signed)
Nausea, Adult °Nausea is the feeling that you have an upset stomach or have to vomit. Nausea by itself is not likely a serious concern, but it may be an early sign of more serious medical problems. As nausea gets worse, it can lead to vomiting. If vomiting develops, there is the risk of dehydration.  °CAUSES  °· Viral infections. °· Food poisoning. °· Medicines. °· Pregnancy. °· Motion sickness. °· Migraine headaches. °· Emotional distress. °· Severe pain from any source. °· Alcohol intoxication. °HOME CARE INSTRUCTIONS °· Get plenty of rest. °· Ask your caregiver about specific rehydration instructions. °· Eat small amounts of food and sip liquids more often. °· Take all medicines as told by your caregiver. °SEEK MEDICAL CARE IF: °· You have not improved after 2 days, or you get worse. °· You have a headache. °SEEK IMMEDIATE MEDICAL CARE IF:  °· You have a fever. °· You faint. °· You keep vomiting or have blood in your vomit. °· You are extremely weak or dehydrated. °· You have dark or bloody stools. °· You have severe chest or abdominal pain. °MAKE SURE YOU: °· Understand these instructions. °· Will watch your condition. °· Will get help right away if you are not doing well or get worse. °  °This information is not intended to replace advice given to you by your health care provider. Make sure you discuss any questions you have with your health care provider. °  °Document Released: 12/25/2004 Document Revised: 12/08/2014 Document Reviewed: 07/30/2011 °Elsevier Interactive Patient Education ©2016 [employer] ° °

## 2015-10-23 NOTE — ED Notes (Signed)
Pt tolerated water and crackers w/o difficulty.

## 2015-10-23 NOTE — ED Notes (Signed)
Pt verbalized understanding of d/c instructions, prescriptions, and follow-up care. No further questions/concerns, VSS, assisted to lobby in wheelchair.  

## 2015-10-23 NOTE — ED Provider Notes (Signed)
CSN: [id number]     Arrival date & time 10/23/15  [id number] History   First MD Initiated Contact with Patient 10/23/15 0857     Chief Complaint  Patient presents with  . Abdominal Pain     (Consider location/radiation/quality/duration/timing/severity/associated sxs/prior Treatment) Patient is a 81 y.o. female presenting with abdominal pain. The history is provided by the patient.  Abdominal Pain Pain location:  Epigastric Pain quality: burning, cramping and squeezing   Pain radiates to:  Does not radiate Pain severity:  Moderate Onset quality:  Sudden Duration:  2 hours Timing:  Rare Progression:  Resolved Chronicity:  New Relieved by:  Nothing Worsened by:  Nothing tried Ineffective treatments:  None tried Associated symptoms: nausea   Associated symptoms: no chest pain, no chills, no dysuria, no fever, no shortness of breath and no vomiting     80 yo F with a chief complaint of nausea. This been going on since yesterday afternoon. Initially was associated with a feeling like gas in her retrosternal area. This resolved with Maalox at home. Patient no longer has that sensation but is continued to have feeling of nausea and weakness. Patient denies any exertional component. Patient denies diaphoresis patient denies vomiting. Last bowel movement today and was normal for her. Patient currently is having no pain or pressure. Patient denied radiation of the pain when she had it. Denies fevers or chills.  Past Medical History  Diagnosis Date  . ANEMIA, IRON DEFICIENCY 09/10/2007  . CATARACT EXTRACTION, HX OF 09/10/2007  . DYSPEPSIA, HX OF 09/10/2007  . HAIR LOSS 03/21/2009  . HAY FEVER 09/10/2007  . HEARING LOSS, SENSORINEURAL 09/10/2007  . HYPERLIPIDEMIA 09/10/2007  . HYPERTENSION 09/10/2007  . LOW BACK PAIN 05/10/2008  . Pain in joint, hand 03/01/2008  . PEPTIC ULCER DISEASE 09/10/2007  . POLYP, COLON 09/10/2007  . POLYPECTOMY, HX OF 09/10/2007  . SHINGLES 08/13/2009  . SHOULDER  PAIN, LEFT 02/12/2008  . TAH/BSO, HX OF 09/10/2007   Past Surgical History  Procedure Laterality Date  . Polypectomy    . Cataract extraction    . Irrigation and debridement sebaceous cyst    . Total abdominal hysterectomy w/ bilateral salpingoophorectomy     Family History  Problem Relation Age of Onset  . Hypertension Mother   . Coronary artery disease Mother   . Heart disease Mother     CHF  . Cancer Neg Hx     breast or colon  . Diabetes Neg Hx    Social History  Substance Use Topics  . Smoking status: Former Smoker -- 0.20 packs/day for 25 years    Types: Cigarettes    Quit date: 12/01/1989  . Smokeless tobacco: None     Comment: smoked a long time ago; Quit 90 or 91/ number is approximate  . Alcohol Use: No   OB History    No data available     Review of Systems  Constitutional: Negative for fever and chills.  HENT: Negative for congestion and rhinorrhea.   Eyes: Negative for redness and visual disturbance.  Respiratory: Negative for chest tightness, shortness of breath and wheezing.   Cardiovascular: Negative for chest pain and palpitations.  Gastrointestinal: Positive for nausea and abdominal pain (substernal). Negative for vomiting.  Genitourinary: Negative for dysuria and urgency.  Musculoskeletal: Negative for myalgias and arthralgias.  Skin: Negative for pallor and wound.  Neurological: Negative for dizziness and headaches.      Allergies  Latex  Home Medications   Prior to Admission medications  Medication Sig Start Date End Date Taking? Authorizing Provider  ALPRAZolam Duanne Moron) 0.5 MG tablet Take 1 tablet (0.5 mg total) by mouth 3 (three) times daily as needed for anxiety. 11/29/14  Yes Golden Circle, FNP  furosemide (LASIX) 40 MG tablet TAKE 1 TABLET (40 MG TOTAL) BY MOUTH DAILY. 06/26/15  Yes Golden Circle, FNP  valsartan (DIOVAN) 320 MG tablet TAKE 1 TABLET BY MOUTH DAILY 10/01/15  Yes Golden Circle, FNP  atorvastatin (LIPITOR) 20 MG  tablet Take 1 tablet (20 mg total) by mouth daily. 05/25/15   Golden Circle, FNP  clotrimazole-betamethasone (LOTRISONE) cream Apply 1 application topically 2 (two) times daily as needed (rash). Patient not taking: Reported on 10/23/2015 09/29/14   Hoyt Koch, MD  ondansetron (ZOFRAN ODT) 4 MG disintegrating tablet Take 1 tablet (4 mg total) by mouth every 8 (eight) hours as needed for nausea or vomiting. 10/23/15   Deno Etienne, DO  promethazine (PHENERGAN) 12.5 MG tablet Take 1 tablet (12.5 mg total) by mouth every 8 (eight) hours as needed for nausea or vomiting. Patient not taking: Reported on 10/23/2015 08/14/15   Hoyt Koch, MD  ranitidine (ZANTAC) 150 MG tablet TAKE 1 TABLET BY MOUTH TWICE A DAY 10/03/15   Hoyt Koch, MD  triamcinolone cream (KENALOG) 0.5 % Apply 1 application topically 2 (two) times daily. To affected areas. Patient not taking: Reported on 10/23/2015 08/18/15   Lyndal Pulley, DO   BP 124/63 mmHg  Pulse 78  Temp(Src) 98.3 F (36.8 C) (Oral)  Resp 16  SpO2 98% Physical Exam  Constitutional: She is oriented to person, place, and time. She appears well-developed and well-nourished. No distress.  HENT:  Head: Normocephalic and atraumatic.  Eyes: EOM are normal. Pupils are equal, round, and reactive to light.  Neck: Normal range of motion. Neck supple.  Cardiovascular: Normal rate and regular rhythm.  Exam reveals no gallop and no friction rub.   No murmur heard. Pulmonary/Chest: Effort normal. She has no wheezes. She has no rales. She exhibits no tenderness.  Abdominal: Soft. She exhibits no distension. There is no tenderness. There is no rebound and no guarding.  Musculoskeletal: She exhibits no edema or tenderness.  Neurological: She is alert and oriented to person, place, and time.  Skin: Skin is warm and dry. She is not diaphoretic.  Psychiatric: She has a normal mood and affect. Her behavior is normal.  Nursing note and vitals  reviewed.   ED Course  Procedures (including critical care time) Labs Review Labs Reviewed  COMPREHENSIVE METABOLIC PANEL - Abnormal; Notable for the following:    Alkaline Phosphatase 153 (*)    GFR calc non Af Amer 60 (*)    All other components within normal limits  URINALYSIS, ROUTINE W REFLEX MICROSCOPIC (NOT AT [facility]) - Abnormal; Notable for the following:    APPearance HAZY (*)    Hgb urine dipstick TRACE (*)    Bilirubin Urine SMALL (*)    Ketones, ur 15 (*)    Protein, ur 30 (*)    Leukocytes, UA SMALL (*)    All other components within normal limits  URINE MICROSCOPIC-ADD ON - Abnormal; Notable for the following:    Squamous Epithelial / LPF 0-5 (*)    Bacteria, UA RARE (*)    All other components within normal limits  CBC WITH DIFFERENTIAL/PLATELET  LIPASE, BLOOD  I-STAT CG4 LACTIC ACID, ED  I-STAT CHEM 8, ED  I-STAT TROPOININ, ED  Randolm Idol, ED  Imaging Review Ct Abdomen Pelvis W Contrast  10/23/2015  CLINICAL DATA:  Epigastric pain and nausea since yesterday. EXAM: CT ABDOMEN AND PELVIS WITH CONTRAST TECHNIQUE: Multidetector CT imaging of the abdomen and pelvis was performed using the standard protocol following bolus administration of intravenous contrast. CONTRAST:  190mL OMNIPAQUE IOHEXOL 300 MG/ML  SOLN COMPARISON:  None. FINDINGS: Lower chest and abdominal wall:  No contributory findings. Hepatobiliary: Limited cystic change in the liver. There is an 8 mm low-density but not simple cystic density lesion along the capsule of segment 5/6 which has peripheral somewhat discontinuous enhancement on the delayed phase, favor incidental hemangioma. No evidence of biliary obstruction or stone. Pancreas: Unremarkable. Spleen: Unremarkable. Adrenals/Urinary Tract: Negative adrenals. No hydronephrosis or stone. Simple bilateral renal cysts. Unremarkable bladder. Reproductive:Hysterectomy.  Negative adnexae. Stomach/Bowel: No obstruction. Uncomplicated distal colonic  diverticulosis. No appendicitis. Vascular/Lymphatic: No acute vascular abnormality. No mass or adenopathy. Peritoneal: No ascites or pneumoperitoneum. Musculoskeletal: No acute abnormalities. Lower lumbar facet arthritis with grade 1 L4-5 anterolisthesis. IMPRESSION: No explanation for acute abdominal pain. Electronically Signed   By: Monte Fantasia M.D.   On: 10/23/2015 11:06   I have personally reviewed and evaluated these images and lab results as part of my medical decision-making.   EKG Interpretation   Date/Time:  Tuesday October 23 2015 09:04:24 EST Ventricular Rate:  80 PR Interval:  153 QRS Duration: 81 QT Interval:  382 QTC Calculation: 441 R Axis:   37 Text Interpretation:  Sinus rhythm Consider right atrial enlargement No  old tracing to compare Confirmed by Heloise Gordan MD, DANIEL [phone]) on 10/23/2015  10:18:26 AM      MDM   Final diagnoses:  Nausea    81 yo F with a chief complaint of epigastric discomfort and nausea. Discomfort has resolved but continues to have nausea with it. No noted abdominal tenderness on exam. Concern for possible cardiac etiology. Obtain CBC CMP lipase i-STAT troponin. History of hypertension hyperlipidemia. Nonsmoker. EKG unremarkable.  Delta trop negative,CT abd pelvis negative, able to tolerate PO.  Will D/c home, close follow up with PCP.   I have discussed the diagnosis/risks/treatment options with the patient and believe the pt to be eligible for discharge home to follow-up with PCP. We also discussed returning to the ED immediately if new or worsening sx occur. We discussed the sx which are most concerning (e.g., sudden worsening pain, fever, inability to tolerate by mouth) that necessitate immediate return. Medications administered to the patient during their visit and any new prescriptions provided to the patient are listed below.  Medications given during this visit Medications  nitroGLYCERIN (NITROSTAT) SL tablet 0.4 mg (not  administered)  0.9 %  sodium chloride infusion (0 mLs Intravenous Stopped 10/23/15 1021)  ondansetron (ZOFRAN) injection 4 mg (4 mg Intravenous Given 10/23/15 0921)  aspirin chewable tablet 324 mg (324 mg Oral Given 10/23/15 0922)  alum & mag hydroxide-simeth (MAALOX/MYLANTA) 200-200-20 MG/5ML suspension 15 mL (15 mLs Oral Given 10/23/15 0922)  lidocaine (XYLOCAINE) 2 % viscous mouth solution 15 mL (15 mLs Mouth/Throat Given 10/23/15 0922)  iohexol (OMNIPAQUE) 300 MG/ML solution 100 mL (100 mLs Intravenous Contrast Given 10/23/15 1024)  ondansetron (ZOFRAN) injection 4 mg (4 mg Intravenous Given 10/23/15 1124)    Discharge Medication List as of 10/23/2015  1:20 PM    START taking these medications   Details  ondansetron (ZOFRAN ODT) 4 MG disintegrating tablet Take 1 tablet (4 mg total) by mouth every 8 (eight) hours as needed for nausea or vomiting., Starting  10/23/2015, Until Discontinued, Print        The patient appears reasonably screen and/or stabilized for discharge and I doubt any other medical condition or other [facility] requiring further screening, evaluation, or treatment in the ED at this time prior to discharge.    Deno Etienne, DO 10/23/15 [phone]

## 2015-10-24 ENCOUNTER — Ambulatory Visit (INDEPENDENT_AMBULATORY_CARE_PROVIDER_SITE_OTHER): Payer: Medicare PPO | Admitting: Family

## 2015-10-24 ENCOUNTER — Encounter: Payer: Self-pay | Admitting: Family

## 2015-10-24 VITALS — BP 142/70 | HR 73 | Temp 98.3°F | Resp 18 | Ht 61.0 in | Wt 157.8 lb

## 2015-10-24 DIAGNOSIS — R110 Nausea: Secondary | ICD-10-CM | POA: Diagnosis not present

## 2015-10-24 NOTE — Patient Instructions (Signed)
Thank you for choosing [facility].  Summary/Instructions:  If your symptoms worsen or fail to improve, please contact our office for further instruction, or in case of emergency go directly to the emergency room at the closest medical facility.    Nausea, Adult Nausea is the feeling that you have an upset stomach or have to vomit. Nausea by itself is not likely a serious concern, but it may be an early sign of more serious medical problems. As nausea gets worse, it can lead to vomiting. If vomiting develops, there is the risk of dehydration.  CAUSES   Viral infections.  Food poisoning.  Medicines.  Pregnancy.  Motion sickness.  Migraine headaches.  Emotional distress.  Severe pain from any source.  Alcohol intoxication. HOME CARE INSTRUCTIONS  Get plenty of rest.  Ask your caregiver about specific rehydration instructions.  Eat small amounts of food and sip liquids more often.  Take all medicines as told by your caregiver. SEEK MEDICAL CARE IF:  You have not improved after 2 days, or you get worse.  You have a headache. SEEK IMMEDIATE MEDICAL CARE IF:   You have a fever.  You faint.  You keep vomiting or have blood in your vomit.  You are extremely weak or dehydrated.  You have dark or bloody stools.  You have severe chest or abdominal pain. MAKE SURE YOU:  Understand these instructions.  Will watch your condition.  Will get help right away if you are not doing well or get worse.   This information is not intended to replace advice given to you by your health care provider. Make sure you discuss any questions you have with your health care provider.   Document Released: 12/25/2004 Document Revised: 12/08/2014 Document Reviewed: 07/30/2011 Elsevier Interactive Patient Education [employer].

## 2015-10-24 NOTE — Assessment & Plan Note (Signed)
Symptoms and nausea most likely related to mild virus given improvement since initial onset. CT scan of the abdomen negative. Continue Zofran as needed. Continue over-the-counter medications as needed for symptom relief. Follow-up if symptoms worsen or fail to improve.

## 2015-10-24 NOTE — Progress Notes (Signed)
Subjective:    Patient ID: Jamie [name], female    DOB: 08/31/1933, 81 y.o.   MRN: [id number]  Chief Complaint  Patient presents with  . Hospitalization Follow-up    still has a little nausea, does have regular BMs    HPI:  Jamie [name] is a 81 y.o. female who  has a past medical history of ANEMIA, IRON DEFICIENCY (09/10/2007); CATARACT EXTRACTION, HX OF (09/10/2007); DYSPEPSIA, HX OF (09/10/2007); HAIR LOSS (03/21/2009); HAY FEVER (09/10/2007); HEARING LOSS, SENSORINEURAL (09/10/2007); HYPERLIPIDEMIA (09/10/2007); HYPERTENSION (09/10/2007); LOW BACK PAIN (05/10/2008); Pain in joint, hand (03/01/2008); PEPTIC ULCER DISEASE (09/10/2007); POLYP, COLON (09/10/2007); POLYPECTOMY, HX OF (09/10/2007); SHINGLES (08/13/2009); SHOULDER PAIN, LEFT (02/12/2008); and TAH/BSO, HX OF (09/10/2007). and presents today for an ED follow up.   Recently seen and evaluated in the emergency room for nausea. There was initial feeling like gas which resolved with Maalox. There was not abdominal tenderness. CT of the abdomen was negative for causes of acute abdominal pain. Cardiac work up was negative. She was discharged to home with close follow up and Zofran as needed. All ED records were reviewed in details.   She continues to experience mild nausea but it is improved since initial onset. Has taken the Zofran as prescribed and notes that it also helps with nausea. Denies abdominal pain, constipation, diarrhea, blood in stool, or vomiting. Describes that her appetite is slightly decreased but was able to eat some toast this morning with no increases in nausea noted.   Allergies  Allergen Reactions  . Latex Itching and Rash     Current Outpatient Prescriptions on File Prior to Visit  Medication Sig Dispense Refill  . ALPRAZolam (XANAX) 0.5 MG tablet Take 1 tablet (0.5 mg total) by mouth 3 (three) times daily as needed for anxiety. 90 tablet 0  . atorvastatin (LIPITOR) 20 MG tablet Take 1 tablet (20 mg  total) by mouth daily. 90 tablet 3  . clotrimazole-betamethasone (LOTRISONE) cream Apply 1 application topically 2 (two) times daily as needed (rash). 30 g 3  . furosemide (LASIX) 40 MG tablet TAKE 1 TABLET (40 MG TOTAL) BY MOUTH DAILY. 90 tablet 1  . ondansetron (ZOFRAN ODT) 4 MG disintegrating tablet Take 1 tablet (4 mg total) by mouth every 8 (eight) hours as needed for nausea or vomiting. 20 tablet 0  . promethazine (PHENERGAN) 12.5 MG tablet Take 1 tablet (12.5 mg total) by mouth every 8 (eight) hours as needed for nausea or vomiting. 20 tablet 0  . ranitidine (ZANTAC) 150 MG tablet TAKE 1 TABLET BY MOUTH TWICE A DAY 180 tablet 1  . triamcinolone cream (KENALOG) 0.5 % Apply 1 application topically 2 (two) times daily. To affected areas. 30 g 3  . valsartan (DIOVAN) 320 MG tablet TAKE 1 TABLET BY MOUTH DAILY 90 tablet 0   No current facility-administered medications on file prior to visit.    Review of Systems  Constitutional: Negative for fever and chills.  Respiratory: Negative for chest tightness and shortness of breath.   Cardiovascular: Negative for chest pain, palpitations and leg swelling.  Gastrointestinal: Positive for nausea. Negative for vomiting, abdominal pain, diarrhea, constipation, blood in stool and abdominal distention.  Neurological: Negative for headaches.      Objective:    BP 142/70 mmHg  Pulse 73  Temp(Src) 98.3 F (36.8 C) (Oral)  Resp 18  Ht 5\' 1"  (1.549 m)  Wt 157 lb 12.8 oz (71.578 kg)  BMI 29.83 kg/m2  SpO2 99% Nursing note and  vital signs reviewed.  Physical Exam  Constitutional: She is oriented to person, place, and time. She appears well-developed and well-nourished. No distress.  Cardiovascular: Normal rate, regular rhythm, normal heart sounds and intact distal pulses.   Pulmonary/Chest: Effort normal and breath sounds normal.  Abdominal: Soft. Normal appearance and bowel sounds are normal. She exhibits no ascites and no mass. There is no  hepatosplenomegaly. There is no tenderness. There is no rigidity, no rebound, no guarding, no tenderness at McBurney's point and negative Murphy's sign.  Neurological: She is alert and oriented to person, place, and time.  Skin: Skin is warm and dry.  Psychiatric: She has a normal mood and affect. Her behavior is normal. Judgment and thought content normal.       Assessment & Plan:   Problem List Items Addressed This Visit      Other   Nausea without vomiting - Primary    Symptoms and nausea most likely related to mild virus given improvement since initial onset. CT scan of the abdomen negative. Continue Zofran as needed. Continue over-the-counter medications as needed for symptom relief. Follow-up if symptoms worsen or fail to improve.

## 2015-10-24 NOTE — Progress Notes (Signed)
Pre visit review using our clinic review tool, if applicable. No additional management support is needed unless otherwise documented below in the visit note. 

## 2015-11-16 ENCOUNTER — Telehealth: Payer: Self-pay | Admitting: *Deleted

## 2015-11-16 ENCOUNTER — Other Ambulatory Visit: Payer: Self-pay | Admitting: Family

## 2015-11-16 MED ORDER — HYDROCODONE-HOMATROPINE 5-1.5 MG/5ML PO SYRP: 5.0000 mL | ORAL_SOLUTION | Freq: Three times a day (TID) | ORAL | Status: DC | PRN

## 2015-11-16 NOTE — Telephone Encounter (Signed)
Received call pt states she is needing a refill on her cough syrup "Hydromet" that was rx by Dr. Linna Darner back in June...Johny Chess

## 2015-11-16 NOTE — Telephone Encounter (Signed)
Faxed script back to [facility].../lmb 

## 2015-11-16 NOTE — Telephone Encounter (Signed)
Notified pt rx ready for pick-up.../lmb 

## 2015-11-16 NOTE — Telephone Encounter (Signed)
Medication refilled - she will have to come and pick it up.

## 2015-11-29 ENCOUNTER — Encounter: Payer: Self-pay | Admitting: Internal Medicine

## 2015-11-29 ENCOUNTER — Ambulatory Visit (INDEPENDENT_AMBULATORY_CARE_PROVIDER_SITE_OTHER): Payer: Medicare PPO | Admitting: Internal Medicine

## 2015-11-29 VITALS — BP 162/78 | HR 90 | Temp 98.6°F | Resp 16 | Ht 62.0 in | Wt 158.1 lb

## 2015-11-29 DIAGNOSIS — R110 Nausea: Secondary | ICD-10-CM

## 2015-11-29 NOTE — Patient Instructions (Signed)
You can keep using the promethazine for the nausea. I would recommend to keep taking the ranitidine for the acid in the stomach.   It is okay to try gas-x or beano over the counter to decrease the amount of gas in your stomach.

## 2015-11-29 NOTE — Progress Notes (Signed)
Pre visit review using our clinic review tool, if applicable. No additional management support is needed unless otherwise documented below in the visit note. 

## 2015-11-30 ENCOUNTER — Encounter: Payer: Self-pay | Admitting: Internal Medicine

## 2015-11-30 NOTE — Assessment & Plan Note (Signed)
At this time no further workup needed. She can continue to use promethazine for the nausea. Recent CT abdomen without findings.

## 2015-11-30 NOTE — Progress Notes (Signed)
   Subjective:    Patient ID: Jamie [name], female    DOB: Mar 27, 1933, 81 y.o.   MRN: [id number]  HPI The patient is an 81 YO female coming in for nausea. She has had episodes of this in the past. This is mild and she is still able to eat. No pain in her stomach. Denies diarrhea or constipation. Has been taking her zantac only once per day instead of twice recently.   Review of Systems  Constitutional: Negative for activity change, appetite change and unexpected weight change.  Respiratory: Negative for cough, chest tightness, shortness of breath and wheezing.   Cardiovascular: Negative for chest pain, palpitations and leg swelling.  Gastrointestinal: Positive for nausea. Negative for abdominal pain, diarrhea, constipation and abdominal distention.  Musculoskeletal: Positive for arthralgias. Negative for back pain and gait problem.  Skin: Negative.   Neurological: Negative for dizziness, weakness, light-headedness and headaches.      Objective:   Physical Exam  Constitutional: She is oriented to person, place, and time. She appears well-developed and well-nourished.  HENT:  Head: Normocephalic and atraumatic.  Eyes: EOM are normal.  Neck: Normal range of motion. Neck supple.  Cardiovascular: Normal rate and regular rhythm.   Pulmonary/Chest: Effort normal and breath sounds normal.  Abdominal: Soft. Bowel sounds are normal. She exhibits no distension. There is no tenderness. There is no rebound.  Musculoskeletal: She exhibits no edema.  Neurological: She is alert and oriented to person, place, and time.  Skin: Skin is warm and dry.   Filed Vitals:   11/29/15 1506  BP: 162/78  Pulse: 90  Temp: 98.6 F (37 C)  TempSrc: Oral  Resp: 16  Height: 5\' 2"  (1.575 m)  Weight: 158 lb 1.9 oz (71.723 kg)  SpO2: 97%      Assessment & Plan:

## 2015-12-07 ENCOUNTER — Other Ambulatory Visit: Payer: Self-pay

## 2015-12-07 MED ORDER — CLOTRIMAZOLE-BETAMETHASONE 1-0.05 % EX CREA: 1.0000 "application " | TOPICAL_CREAM | Freq: Two times a day (BID) | CUTANEOUS | Status: DC | PRN

## 2015-12-28 ENCOUNTER — Telehealth: Payer: Self-pay | Admitting: Family

## 2015-12-28 NOTE — Telephone Encounter (Signed)
[facility] Patient Name: Jamie [name] DOB: 1933-07-27 Initial Comment Caller states she forgot to take her bp medications this morning. Wants to know if she waits until tomorrow if that would be ok. Nurse Assessment Nurse: Markus Daft, RN, [place] Date/Time Eilene Ghazi Time): 12/28/2015 2:18:03 PM Confirm and document reason for call. If symptomatic, describe symptoms. You must click the next button to save text entered. ---Caller states she forgot to take her BP medications this morning. They are ordered once daily. She wants to know if she waits until tomorrow if that would be ok. She has not checked her BP today. Denies s/s. Has the patient traveled out of the country within the last 30 days? ---Not Applicable Does the patient have any new or worsening symptoms? ---No Please document clinical information provided and list any resource used. ---RN advised that she go ahead and take her BP meds now, and then tomorrow take them at noon, and then Sunday around 10 am, and then Monday her routine time which she reports is 8 or 9 am. Caller verb. understanding. (per RN clinical exp). Guidelines Guideline Title Affirmed Question Affirmed Notes Final Disposition User Clinical Call [place], RN, [employer]

## 2015-12-31 ENCOUNTER — Other Ambulatory Visit: Payer: Self-pay | Admitting: Family

## 2016-01-03 ENCOUNTER — Encounter: Payer: Self-pay | Admitting: Gastroenterology

## 2016-01-24 ENCOUNTER — Ambulatory Visit (INDEPENDENT_AMBULATORY_CARE_PROVIDER_SITE_OTHER): Payer: Medicare Other | Admitting: Internal Medicine

## 2016-01-24 ENCOUNTER — Encounter: Payer: Self-pay | Admitting: Internal Medicine

## 2016-01-24 VITALS — BP 140/80 | HR 94 | Temp 99.2°F | Resp 20 | Wt 162.0 lb

## 2016-01-24 DIAGNOSIS — R062 Wheezing: Secondary | ICD-10-CM

## 2016-01-24 DIAGNOSIS — R05 Cough: Secondary | ICD-10-CM

## 2016-01-24 DIAGNOSIS — I10 Essential (primary) hypertension: Secondary | ICD-10-CM

## 2016-01-24 DIAGNOSIS — R059 Cough, unspecified: Secondary | ICD-10-CM

## 2016-01-24 MED ORDER — AZITHROMYCIN 250 MG PO TABS: ORAL_TABLET | ORAL | Status: DC

## 2016-01-24 MED ORDER — PREDNISONE 10 MG PO TABS: ORAL_TABLET | ORAL | Status: DC

## 2016-01-24 MED ORDER — HYDROCODONE-HOMATROPINE 5-1.5 MG/5ML PO SYRP: 5.0000 mL | ORAL_SOLUTION | Freq: Three times a day (TID) | ORAL | Status: DC | PRN

## 2016-01-24 NOTE — Progress Notes (Signed)
Subjective:    Patient ID: Jamie [name], female    DOB: Dec 28, 1932, 82 y.o.   MRN: [id number]  HPI  Here with acute onset mild to mod 2-3 days ST, HA, general weakness and malaise, with prod cough greenish sputum, but Pt denies chest pain, increased sob or doe, wheezing, orthopnea, PND, increased LE swelling, palpitations, dizziness or syncope, except for onset mild wheezing/sob last pm.  Pt denies new neurological symptoms such as new headache, or facial or extremity weakness or numbness   Pt denies polydipsia, polyuria Past Medical History  Diagnosis Date  . ANEMIA, IRON DEFICIENCY 09/10/2007  . CATARACT EXTRACTION, HX OF 09/10/2007  . DYSPEPSIA, HX OF 09/10/2007  . HAIR LOSS 03/21/2009  . HAY FEVER 09/10/2007  . HEARING LOSS, SENSORINEURAL 09/10/2007  . HYPERLIPIDEMIA 09/10/2007  . HYPERTENSION 09/10/2007  . LOW BACK PAIN 05/10/2008  . Pain in joint, hand 03/01/2008  . PEPTIC ULCER DISEASE 09/10/2007  . POLYP, COLON 09/10/2007  . POLYPECTOMY, HX OF 09/10/2007  . SHINGLES 08/13/2009  . SHOULDER PAIN, LEFT 02/12/2008  . TAH/BSO, HX OF 09/10/2007   Past Surgical History  Procedure Laterality Date  . Polypectomy    . Cataract extraction    . Irrigation and debridement sebaceous cyst    . Total abdominal hysterectomy w/ bilateral salpingoophorectomy      reports that she quit smoking about 26 years ago. Her smoking use included Cigarettes. She has a 5 pack-year smoking history. She does not have any smokeless tobacco history on file. She reports that she does not drink alcohol. Her drug history is not on file. family history includes Coronary artery disease in her mother; Heart disease in her mother; Hypertension in her mother. There is no history of Cancer or Diabetes. Allergies  Allergen Reactions  . Latex Itching and Rash   Current Outpatient Prescriptions on File Prior to Visit  Medication Sig Dispense Refill  . ALPRAZolam (XANAX) 0.5 MG tablet TAKE 1 TABLET BY MOUTH 3  TIMES A DAY AS NEEDED FOR ANXIETY 90 tablet 0  . atorvastatin (LIPITOR) 20 MG tablet Take 1 tablet (20 mg total) by mouth daily. 90 tablet 3  . clotrimazole-betamethasone (LOTRISONE) cream Apply 1 application topically 2 (two) times daily as needed (rash). 30 g 3  . furosemide (LASIX) 40 MG tablet TAKE 1 TABLET (40 MG TOTAL) BY MOUTH DAILY. 90 tablet 1  . HYDROcodone-homatropine (HYCODAN) 5-1.5 MG/5ML syrup Take 5 mLs by mouth every 8 (eight) hours as needed for cough. 180 mL 0  . ondansetron (ZOFRAN ODT) 4 MG disintegrating tablet Take 1 tablet (4 mg total) by mouth every 8 (eight) hours as needed for nausea or vomiting. 20 tablet 0  . promethazine (PHENERGAN) 12.5 MG tablet Take 1 tablet (12.5 mg total) by mouth every 8 (eight) hours as needed for nausea or vomiting. 20 tablet 0  . ranitidine (ZANTAC) 150 MG tablet TAKE 1 TABLET BY MOUTH TWICE A DAY 180 tablet 1  . triamcinolone cream (KENALOG) 0.5 % Apply 1 application topically 2 (two) times daily. To affected areas. 30 g 3  . valsartan (DIOVAN) 320 MG tablet TAKE 1 TABLET BY MOUTH DAILY 90 tablet 0   No current facility-administered medications on file prior to visit.      Review of Systems  Constitutional: Negative for unusual diaphoresis or night sweats HENT: Negative for ringing in ear or discharge Eyes: Negative for double vision or worsening visual disturbance.  Respiratory: Negative for choking and stridor.   Gastrointestinal:  Negative for vomiting or other signifcant bowel change Genitourinary: Negative for hematuria or change in urine volume.  Musculoskeletal: Negative for other MSK pain or swelling Skin: Negative for color change and worsening wound.  Neurological: Negative for tremors and numbness other than noted  Psychiatric/Behavioral: Negative for decreased concentration or agitation other than above       Objective:   Physical Exam BP 140/80 mmHg  Pulse 94  Temp(Src) 99.2 F (37.3 C) (Oral)  Resp 20  Wt 162 lb  (73.483 kg)  SpO2 95% VS noted, mild ill Constitutional: Pt appears in no significant distress HENT: Head: NCAT.  Right Ear: External ear normal.  Left Ear: External ear normal.  Eyes: . Pupils are equal, round, and reactive to light. Conjunctivae and EOM are normal Neck: Normal range of motion. Neck supple.  Cardiovascular: Normal rate and regular rhythm.   Pulmonary/Chest: Effort normal and breath sounds decresaed without rales few scattered insp wheezes and exp wheezing.  Neurological: Pt is alert. Not confused , motor grossly intact Skin: Skin is warm. No rash, no LE edema Psychiatric: Pt behavior is normal. No agitation.      Assessment & Plan:

## 2016-01-24 NOTE — Assessment & Plan Note (Signed)
stable overall by history and exam, recent data reviewed with pt, and pt to continue medical treatment as before,  to f/u any worsening symptoms or concerns BP Readings from Last 3 Encounters:  01/24/16 140/80  11/29/15 162/78  10/24/15 142/70

## 2016-01-24 NOTE — Patient Instructions (Signed)
Please take all new medication as prescribed - the antibiotic, cough medicine, and prednisone ° °Please continue all other medications as before, and refills have been done if requested. ° °Please have the pharmacy call with any other refills you may need. ° °Please keep your appointments with your specialists as you may have planned ° ° ° °

## 2016-01-24 NOTE — Assessment & Plan Note (Signed)
Mild to mod, c/w bronchitis vs pna, declines cxr, for antibx course, cough med prn,  to f/u any worsening symptoms or concerns 

## 2016-01-24 NOTE — Progress Notes (Signed)
Pre visit review using our clinic review tool, if applicable. No additional management support is needed unless otherwise documented below in the visit note. 

## 2016-01-24 NOTE — Assessment & Plan Note (Signed)
Mild to mod, for predpac asd as likely bronchospastic type,  to f/u any worsening symptoms or concerns

## 2016-03-26 ENCOUNTER — Other Ambulatory Visit: Payer: Self-pay | Admitting: Family

## 2016-03-31 ENCOUNTER — Other Ambulatory Visit: Payer: Self-pay | Admitting: Family

## 2016-04-01 LAB — HM MAMMOGRAPHY

## 2016-04-15 ENCOUNTER — Other Ambulatory Visit: Payer: Self-pay | Admitting: Internal Medicine

## 2016-04-16 ENCOUNTER — Encounter: Payer: Self-pay | Admitting: Family

## 2016-04-22 ENCOUNTER — Telehealth: Payer: Self-pay

## 2016-04-22 NOTE — Telephone Encounter (Signed)
promethazine (PHENERGAN) 12.5 MG tablet [id number]      [facility] called and needs authoritarian on this medication. If you would please call them back. Thank you

## 2016-04-23 ENCOUNTER — Encounter: Payer: Self-pay | Admitting: Internal Medicine

## 2016-04-23 NOTE — Telephone Encounter (Signed)
Returned call and they stated they needed a PA for promethazine. I asked them to resend the PA over.

## 2016-04-25 ENCOUNTER — Telehealth: Payer: Self-pay

## 2016-04-25 MED ORDER — PROMETHAZINE HCL 12.5 MG PO TABS: ORAL_TABLET | ORAL | Status: DC

## 2016-04-25 NOTE — Telephone Encounter (Signed)
Pt lm on triage. rf rq for promethezine.

## 2016-04-25 NOTE — Telephone Encounter (Signed)
Medication sent.

## 2016-04-29 ENCOUNTER — Telehealth: Payer: Self-pay

## 2016-04-29 NOTE — Telephone Encounter (Signed)
Additional clinical information submitted via fax to [facility]

## 2016-04-29 NOTE — Telephone Encounter (Signed)
PA initiated via CoverMyMeds key [id number]

## 2016-05-05 NOTE — Telephone Encounter (Signed)
Pt has paid out of pocket ($12.74) for this prescription rather than wait for PA

## 2016-05-21 NOTE — Telephone Encounter (Signed)
PA came back denied for promethazine. Pt is aware.

## 2016-06-16 ENCOUNTER — Telehealth: Payer: Self-pay | Admitting: Internal Medicine

## 2016-06-16 ENCOUNTER — Telehealth: Payer: Self-pay | Admitting: Family

## 2016-06-16 NOTE — Telephone Encounter (Signed)
Patient Name: Jamie [name]  DOB: [birth year]    Initial Comment Caller says she spoke with at nurse 2 hrs ago. Her BP has gone up more 170/73, pulse 91. No sx    Nurse Assessment  Nurse: Thad Ranger, RN, Langley Gauss Date/Time (Eastern Time): 06/16/2016 5:07:12 PM  Confirm and document reason for call. If symptomatic, describe symptoms. You must click the next button to save text entered. ---Caller says she spoke with at nurse 2 hrs ago. Her BP has gone up more 170/73, pulse 91. No sx BP is now 159/74, HR 79. Denies HA or dizziness. Takes Valsartan and Furosemide qd. Does not take an evening medication for BP. See prev record [id number] States she does not have any new/worsening s/s at this time and wanted to report the BP of 170 taken at 1610.  Has the patient traveled out of the country within the last 30 days? ---Not Applicable  Does the patient have any new or worsening symptoms? ---No  Please document clinical information provided and list any resource used. ---Pt w/ 0930 appt in the am with Dr Mauricio Po. Based on RN clinical knowledge, advised BP is slightly elevated but not dangerously elevated. Advised to cb if she starts having s/s such as HA, SOB, CP, or dizziness. Advised to keep the appt as scheduled in the am. Verb understanding.     Guidelines    Guideline Title Affirmed Question Affirmed Notes       Final Disposition User   Clinical Call [place], RN, [employer]

## 2016-06-16 NOTE — Telephone Encounter (Signed)
Patient Name: Jamie [name]  DOB: [birth year]    Initial Comment Caller says, her bp is going up for the last couple of days, 1130 it was 150/71 now it is 164/75    Nurse Assessment  Nurse: Raphael Gibney, RN, Vanita Ingles Date/Time (Eastern Time): 06/16/2016 2:08:53 PM  Confirm and document reason for call. If symptomatic, describe symptoms. You must click the next button to save text entered. ---Caller states her BP has been elevating the past couple of days. at 11:30 am today it was 151/71 and now it is 164/75. No dizziness. had slight headache when she woke up and it went away.  Has the patient traveled out of the country within the last 30 days? ---Not Applicable  Does the patient have any new or worsening symptoms? ---Yes  Will a triage be completed? ---Yes  Related visit to physician within the last 2 weeks? ---No  Does the PT have any chronic conditions? (i.e. diabetes, asthma, etc.) ---Yes  List chronic conditions. ---HTN  Is this a behavioral health or substance abuse call? ---No     Guidelines    Guideline Title Affirmed Question Affirmed Notes  High Blood Pressure Systolic BP >= 0000000 OR Diastolic >= 123XX123    Final Disposition User   See PCP When Office is Open (within 3 days) Raphael Gibney, RN, Vanita Ingles    Comments  Pt does not want to wait until Wednesday to see Dr. Sharlet Salina. appt scheduled for 06/17/16 at 9:30 am with Dr. Mauricio Po   Referrals  REFERRED TO PCP OFFICE   Disagree/Comply: Comply

## 2016-06-17 ENCOUNTER — Ambulatory Visit (INDEPENDENT_AMBULATORY_CARE_PROVIDER_SITE_OTHER): Payer: Medicare Other | Admitting: Family

## 2016-06-17 ENCOUNTER — Encounter: Payer: Self-pay | Admitting: Family

## 2016-06-17 VITALS — BP 134/72 | HR 64 | Temp 98.9°F | Ht 62.0 in | Wt 164.4 lb

## 2016-06-17 DIAGNOSIS — E669 Obesity, unspecified: Secondary | ICD-10-CM

## 2016-06-17 DIAGNOSIS — I10 Essential (primary) hypertension: Secondary | ICD-10-CM

## 2016-06-17 NOTE — Progress Notes (Signed)
Subjective:    Patient ID: Jamie [name], female    DOB: [birth year], 82 y.o.   MRN: [id number]  Chief Complaint  Patient presents with  . Hypertension    F/u on Bp- Pt states BP started going up last week running in the 140's. Yesterday check BP @ 11:30 150/71, check again @ 2:00 it was 164/75, and around 4:00 BP was 170/73    HPI:  Jamie [name] is a 82 y.o. female who  has a past medical history of ANEMIA, IRON DEFICIENCY (09/10/2007); CATARACT EXTRACTION, HX OF (09/10/2007); DYSPEPSIA, HX OF (09/10/2007); HAIR LOSS (03/21/2009); HAY FEVER (09/10/2007); HEARING LOSS, SENSORINEURAL (09/10/2007); HYPERLIPIDEMIA (09/10/2007); HYPERTENSION (09/10/2007); LOW BACK PAIN (05/10/2008); Pain in joint, hand (03/01/2008); PEPTIC ULCER DISEASE (09/10/2007); POLYP, COLON (09/10/2007); POLYPECTOMY, HX OF (09/10/2007); SHINGLES (08/13/2009); SHOULDER PAIN, LEFT (02/12/2008); and TAH/BSO, HX OF (09/10/2007). and presents today for an acute office visit.  Hypertension - This is an acute problem with an exacerbation. Currently maintained on furosemide and valsartan. Reports taking the medication as prescribed and denies adverse side effects or hypotensive readings. No symptoms of end organ damage or worst headache of her life. Blood pressure at home was elevated with readings of 150/71, 164/75 and 170/73. Indicates she is working on a low sodium diet with minimal exercise.   BP Readings from Last 3 Encounters:  06/17/16 134/72  01/24/16 140/80  11/29/15 162/78    Allergies  Allergen Reactions  . Latex Itching and Rash     Current Outpatient Prescriptions on File Prior to Visit  Medication Sig Dispense Refill  . ALPRAZolam (XANAX) 0.5 MG tablet TAKE 1 TABLET BY MOUTH 3 TIMES A DAY AS NEEDED FOR ANXIETY 90 tablet 0  . atorvastatin (LIPITOR) 20 MG tablet Take 1 tablet (20 mg total) by mouth daily. 90 tablet 3  . clotrimazole-betamethasone (LOTRISONE) cream Apply 1 application topically 2 (two)  times daily as needed (rash). 30 g 3  . furosemide (LASIX) 40 MG tablet TAKE 1 TABLET (40 MG TOTAL) BY MOUTH DAILY. 90 tablet 1  . predniSONE (DELTASONE) 10 MG tablet 3 tabs by mouth per day for 3 days,2tabs per day for 3 days,1tab per day for 3 days 18 tablet 0  . promethazine (PHENERGAN) 12.5 MG tablet TAKE 1 TABLET (12.5 MG TOTAL) BY MOUTH EVERY 8 (EIGHT) HOURS AS NEEDED FOR NAUSEA OR VOMITING. 20 tablet 0  . ranitidine (ZANTAC) 150 MG tablet TAKE 1 TABLET BY MOUTH TWICE A DAY 180 tablet 1  . triamcinolone cream (KENALOG) 0.5 % Apply 1 application topically 2 (two) times daily. To affected areas. 30 g 3  . valsartan (DIOVAN) 320 MG tablet TAKE 1 TABLET BY MOUTH DAILY 90 tablet 1   No current facility-administered medications on file prior to visit.     Review of Systems  Constitutional: Negative for fever and chills.  Eyes:       Negative for changes in vision  Respiratory: Negative for cough, chest tightness and wheezing.   Cardiovascular: Negative for chest pain, palpitations and leg swelling.  Neurological: Negative for dizziness, weakness and light-headedness.      Objective:    BP 134/72 mmHg  Pulse 64  Temp(Src) 98.9 F (37.2 C) (Oral)  Ht 5\' 2"  (1.575 m)  Wt 164 lb 6.4 oz (74.571 kg)  BMI 30.06 kg/m2  SpO2 96% Nursing note and vital signs reviewed.  Physical Exam  Constitutional: She is oriented to person, place, and time. She appears well-developed and well-nourished. No distress.  Cardiovascular: Normal rate, regular rhythm, normal heart sounds and intact distal pulses.   Pulmonary/Chest: Effort normal and breath sounds normal.  Neurological: She is alert and oriented to person, place, and time.  Skin: Skin is warm and dry.  Psychiatric: She has a normal mood and affect. Her behavior is normal. Judgment and thought content normal.       Assessment & Plan:   Problem List Items Addressed This Visit      Cardiovascular and Mediastinum   Essential hypertension  - Primary    Blood pressure elevated at home and normal in office and below goal 140/90. Denies adverse side effects, symptoms of end organ damage, or worse headache of life with current regimen. Continue current dosage of furosemide and valsartan. Recommend increasing furosemide one half tablet as needed for systolic blood pressure above 150. Schedule nurse visit in 2 weeks to recheck blood pressure and correlate cough. Reduce sodium in diet.        Other   Obesity    BMI of 30 with recommendation of weight loss of approximately 5 steps percent of current body weight through nutrition and physical activity. Does have challenges taking care of her husband who has chronic conditions.Recommend increasing physical activity to 30 minutes of moderate level activity daily. Encourage nutritional intake that focuses on nutrient dense foods and is moderate, varied, and balanced and is low in saturated fats and processed/sugary foods. Continue to monitor.          I have discontinued Ms. Coulon's ondansetron, azithromycin, and HYDROcodone-homatropine. I am also having her maintain her atorvastatin, triamcinolone cream, ranitidine, ALPRAZolam, clotrimazole-betamethasone, furosemide, predniSONE, valsartan, and promethazine.   Follow-up: Return in about 2 weeks (around 07/01/2016), or if symptoms worsen or fail to improve, for 2 weeks for nurse visit.   Mauricio Po, FNP

## 2016-06-17 NOTE — Assessment & Plan Note (Signed)
BMI of 30 with recommendation of weight loss of approximately 5 steps percent of current body weight through nutrition and physical activity. Does have challenges taking care of her husband who has chronic conditions.Recommend increasing physical activity to 30 minutes of moderate level activity daily. Encourage nutritional intake that focuses on nutrient dense foods and is moderate, varied, and balanced and is low in saturated fats and processed/sugary foods. Continue to monitor.

## 2016-06-17 NOTE — Patient Instructions (Signed)
Thank you for choosing [facility].  Summary/Instructions:  Please continue to take your medications as prescribed.   Decrease sodium in your diet.  Take 1/2 additional furosemide if blood pressure is >150 on the top.  Schedule a nurse visit to corollate blood pressure cuff.    If your symptoms worsen or fail to improve, please contact our office for further instruction, or in case of emergency go directly to the emergency room at the closest medical facility.

## 2016-06-17 NOTE — Progress Notes (Signed)
Pre visit review using our clinic review tool, if applicable. No additional management support is needed unless otherwise documented below in the visit note. 

## 2016-06-17 NOTE — Assessment & Plan Note (Signed)
Blood pressure elevated at home and normal in office and below goal 140/90. Denies adverse side effects, symptoms of end organ damage, or worse headache of life with current regimen. Continue current dosage of furosemide and valsartan. Recommend increasing furosemide one half tablet as needed for systolic blood pressure above 150. Schedule nurse visit in 2 weeks to recheck blood pressure and correlate cough. Reduce sodium in diet.

## 2016-06-20 ENCOUNTER — Encounter: Payer: Self-pay | Admitting: Family

## 2016-06-20 ENCOUNTER — Ambulatory Visit (INDEPENDENT_AMBULATORY_CARE_PROVIDER_SITE_OTHER): Payer: Medicare Other | Admitting: Family

## 2016-06-20 VITALS — BP 150/66 | HR 90 | Temp 98.6°F | Ht 62.0 in | Wt 161.0 lb

## 2016-06-20 DIAGNOSIS — I10 Essential (primary) hypertension: Secondary | ICD-10-CM

## 2016-06-20 MED ORDER — AMLODIPINE BESYLATE 5 MG PO TABS: 5.0000 mg | ORAL_TABLET | Freq: Every day | ORAL | Status: DC

## 2016-06-20 NOTE — Patient Instructions (Addendum)
Thank you for choosing [facility].  Summary/Instructions:  Please continue to take your medications as prescribed.   Your prescription(s) have been submitted to your pharmacy or been printed and provided for you. Please take as directed and contact our office if you believe you are having problem(s) with the medication(s) or have any questions.  If your symptoms worsen or fail to improve, please contact our office for further instruction, or in case of emergency go directly to the emergency room at the closest medical facility.     

## 2016-06-20 NOTE — Progress Notes (Signed)
Subjective:    Patient ID: Jamie [name], female    DOB: 01/30/1933, 82 y.o.   MRN: [id number]  Chief Complaint  Patient presents with  . Hypertension    161/72 in the am, 171/82 at 5pm, and 167/77 at 7:46pm on 06/18/16.     HPI:  Jamie [name] is a 82 y.o. female who  has a past medical history of ANEMIA, IRON DEFICIENCY (09/10/2007); CATARACT EXTRACTION, HX OF (09/10/2007); DYSPEPSIA, HX OF (09/10/2007); HAIR LOSS (03/21/2009); HAY FEVER (09/10/2007); HEARING LOSS, SENSORINEURAL (09/10/2007); HYPERLIPIDEMIA (09/10/2007); HYPERTENSION (09/10/2007); LOW BACK PAIN (05/10/2008); Pain in joint, hand (03/01/2008); PEPTIC ULCER DISEASE (09/10/2007); POLYP, COLON (09/10/2007); POLYPECTOMY, HX OF (09/10/2007); SHINGLES (08/13/2009); SHOULDER PAIN, LEFT (02/12/2008); and TAH/BSO, HX OF (09/10/2007). and presents today for a follow up office visit.  1.) Hypertension - This is a chronic problem. Recently seen in the office earlier this week for elevated blood pressure and noted to have a blood pressure that was under goal. She was Instructed to continue taking medications as prescribed and follow-up in 2 weeks for a blood pressure check. Notes blood pressures at home have been 160/70, 171/82 and 167/77. Currently maintained on furosemide and valsartan. Denies worse headache of life or symptoms of end organ damage.  BP Readings from Last 3 Encounters:  06/20/16 150/66  06/17/16 134/72  01/24/16 140/80    Allergies  Allergen Reactions  . Latex Itching and Rash     Current Outpatient Prescriptions on File Prior to Visit  Medication Sig Dispense Refill  . ALPRAZolam (XANAX) 0.5 MG tablet TAKE 1 TABLET BY MOUTH 3 TIMES A DAY AS NEEDED FOR ANXIETY 90 tablet 0  . atorvastatin (LIPITOR) 20 MG tablet Take 1 tablet (20 mg total) by mouth daily. 90 tablet 3  . clotrimazole-betamethasone (LOTRISONE) cream Apply 1 application topically 2 (two) times daily as needed (rash). 30 g 3  . furosemide  (LASIX) 40 MG tablet TAKE 1 TABLET (40 MG TOTAL) BY MOUTH DAILY. 90 tablet 1  . predniSONE (DELTASONE) 10 MG tablet 3 tabs by mouth per day for 3 days,2tabs per day for 3 days,1tab per day for 3 days 18 tablet 0  . promethazine (PHENERGAN) 12.5 MG tablet TAKE 1 TABLET (12.5 MG TOTAL) BY MOUTH EVERY 8 (EIGHT) HOURS AS NEEDED FOR NAUSEA OR VOMITING. 20 tablet 0  . ranitidine (ZANTAC) 150 MG tablet TAKE 1 TABLET BY MOUTH TWICE A DAY 180 tablet 1  . triamcinolone cream (KENALOG) 0.5 % Apply 1 application topically 2 (two) times daily. To affected areas. 30 g 3  . valsartan (DIOVAN) 320 MG tablet TAKE 1 TABLET BY MOUTH DAILY 90 tablet 1   No current facility-administered medications on file prior to visit.     Past Surgical History  Procedure Laterality Date  . Polypectomy    . Cataract extraction    . Irrigation and debridement sebaceous cyst    . Total abdominal hysterectomy w/ bilateral salpingoophorectomy       Review of Systems  Constitutional: Negative for fever and chills.  Eyes:       Negative for changes in vision  Respiratory: Negative for cough, chest tightness and wheezing.   Cardiovascular: Negative for chest pain, palpitations and leg swelling.  Neurological: Negative for dizziness, weakness and light-headedness.      Objective:    BP 150/66 mmHg  Pulse 90  Temp(Src) 98.6 F (37 C) (Oral)  Ht 5\' 2"  (1.575 m)  Wt 161 lb (73.029 kg)  BMI 29.44 kg/m2  SpO2 98% Nursing note and vital signs reviewed.  Physical Exam  Constitutional: She is oriented to person, place, and time. She appears well-developed and well-nourished. No distress.  Cardiovascular: Normal rate, regular rhythm, normal heart sounds and intact distal pulses.   Pulmonary/Chest: Effort normal and breath sounds normal.  Neurological: She is alert and oriented to person, place, and time.  Skin: Skin is warm and dry.  Psychiatric: She has a normal mood and affect. Her behavior is normal. Judgment and  thought content normal.       Assessment & Plan:   Problem List Items Addressed This Visit      Cardiovascular and Mediastinum   Essential hypertension - Primary    Continues to note elevated blood pressure with no associated symptoms. Recommend goal of 150/90. Continue current dosage of valsartan and furosemide. Start amlodipine. Follow-up in 2 weeks for nurse visit to recheck blood pressure. Continue following a low-sodium diet. Continue to monitor blood pressure at home.      Relevant Medications   amLODipine (NORVASC) 5 MG tablet       I am having Ms. Lawley start on amLODipine. I am also having her maintain her atorvastatin, triamcinolone cream, ranitidine, ALPRAZolam, clotrimazole-betamethasone, furosemide, predniSONE, valsartan, and promethazine.   Meds ordered this encounter  Medications  . amLODipine (NORVASC) 5 MG tablet    Sig: Take 1 tablet (5 mg total) by mouth daily.    Dispense:  30 tablet    Refill:  0    Order Specific Question:  Supervising Provider    Answer:  Pricilla Holm A [id number]     Follow-up: Return in about 2 weeks (around 07/04/2016), or if symptoms worsen or fail to improve.  Mauricio Po, FNP

## 2016-06-20 NOTE — Assessment & Plan Note (Signed)
Continues to note elevated blood pressure with no associated symptoms. Recommend goal of 150/90. Continue current dosage of valsartan and furosemide. Start amlodipine. Follow-up in 2 weeks for nurse visit to recheck blood pressure. Continue following a low-sodium diet. Continue to monitor blood pressure at home.

## 2016-06-20 NOTE — Progress Notes (Signed)
Pre visit review using our clinic review tool, if applicable. No additional management support is needed unless otherwise documented below in the visit note. 

## 2016-06-23 ENCOUNTER — Other Ambulatory Visit: Payer: Self-pay | Admitting: Family

## 2016-07-02 ENCOUNTER — Ambulatory Visit: Payer: Medicare Other

## 2016-07-02 VITALS — BP 152/68

## 2016-07-02 DIAGNOSIS — I10 Essential (primary) hypertension: Secondary | ICD-10-CM

## 2016-07-02 NOTE — Progress Notes (Signed)
Patient's home bp cuff (which is only 5 weeks old) is working well, home bp reading was 154/72 and my reading at office today was 152/68---patient also had list of bp trends she is documenting at all different times of day, and bp is well controlled after about 2 hours past med administration, most of her trends average around 130/70----patient advised that if she starts getting readings that are continually in [id number] systolic and not reduced by current bp med dosage to call our office back, she may need a office visit to readjust meds

## 2016-07-15 ENCOUNTER — Other Ambulatory Visit: Payer: Self-pay | Admitting: Family

## 2016-07-27 ENCOUNTER — Other Ambulatory Visit: Payer: Self-pay | Admitting: Family

## 2016-07-29 ENCOUNTER — Other Ambulatory Visit: Payer: Self-pay

## 2016-07-29 DIAGNOSIS — Z1231 Encounter for screening mammogram for malignant neoplasm of breast: Secondary | ICD-10-CM

## 2016-08-07 ENCOUNTER — Ambulatory Visit (INDEPENDENT_AMBULATORY_CARE_PROVIDER_SITE_OTHER)
Admission: RE | Admit: 2016-08-07 | Discharge: 2016-08-07 | Disposition: A | Discharge status: Home / Self Care | Payer: Medicare Other | Source: Ambulatory Visit | Attending: Nurse Practitioner | Admitting: Nurse Practitioner

## 2016-08-07 ENCOUNTER — Encounter: Payer: Self-pay | Admitting: Nurse Practitioner

## 2016-08-07 ENCOUNTER — Ambulatory Visit (INDEPENDENT_AMBULATORY_CARE_PROVIDER_SITE_OTHER): Payer: Medicare Other | Admitting: Nurse Practitioner

## 2016-08-07 VITALS — BP 128/82 | HR 90 | Temp 97.8°F | Wt 164.0 lb

## 2016-08-07 DIAGNOSIS — R05 Cough: Secondary | ICD-10-CM

## 2016-08-07 DIAGNOSIS — R0602 Shortness of breath: Secondary | ICD-10-CM | POA: Diagnosis not present

## 2016-08-07 DIAGNOSIS — J209 Acute bronchitis, unspecified: Secondary | ICD-10-CM | POA: Diagnosis not present

## 2016-08-07 DIAGNOSIS — R059 Cough, unspecified: Secondary | ICD-10-CM

## 2016-08-07 MED ORDER — BENZONATATE 100 MG PO CAPS: 100.0000 mg | ORAL_CAPSULE | Freq: Three times a day (TID) | ORAL | 0 refills | Status: DC | PRN

## 2016-08-07 MED ORDER — GUAIFENESIN ER 600 MG PO TB12: 600.0000 mg | ORAL_TABLET | Freq: Two times a day (BID) | ORAL | 0 refills | Status: DC | PRN

## 2016-08-07 MED ORDER — ALBUTEROL SULFATE HFA 108 (90 BASE) MCG/ACT IN AERS: 2.0000 | INHALATION_SPRAY | Freq: Four times a day (QID) | RESPIRATORY_TRACT | 0 refills | Status: DC | PRN

## 2016-08-07 NOTE — Patient Instructions (Signed)
Will call with chest x-ray results.  Encourage adequate oral hydration.   Acute Bronchitis Bronchitis is inflammation of the airways that extend from the windpipe into the lungs (bronchi). The inflammation often causes mucus to develop. This leads to a cough, which is the most common symptom of bronchitis.  In acute bronchitis, the condition usually develops suddenly and goes away over time, usually in a couple weeks. Smoking, allergies, and asthma can make bronchitis worse. Repeated episodes of bronchitis may cause further lung problems.  CAUSES Acute bronchitis is most often caused by the same virus that causes a cold. The virus can spread from person to person (contagious) through coughing, sneezing, and touching contaminated objects. SIGNS AND SYMPTOMS   Cough.   Fever.   Coughing up mucus.   Body aches.   Chest congestion.   Chills.   Shortness of breath.   Sore throat.  DIAGNOSIS  Acute bronchitis is usually diagnosed through a physical exam. Your health care provider will also ask you questions about your medical history. Tests, such as chest X-rays, are sometimes done to rule out other conditions.  TREATMENT  Acute bronchitis usually goes away in a couple weeks. Oftentimes, no medical treatment is necessary. Medicines are sometimes given for relief of fever or cough. Antibiotic medicines are usually not needed but may be prescribed in certain situations. In some cases, an inhaler may be recommended to help reduce shortness of breath and control the cough. A cool mist vaporizer may also be used to help thin bronchial secretions and make it easier to clear the chest.  HOME CARE INSTRUCTIONS  Get plenty of rest.   Drink enough fluids to keep your urine clear or pale yellow (unless you have a medical condition that requires fluid restriction). Increasing fluids may help thin your respiratory secretions (sputum) and reduce chest congestion, and it will prevent  dehydration.   Take medicines only as directed by your health care provider.  If you were prescribed an antibiotic medicine, finish it all even if you start to feel better.  Avoid smoking and secondhand smoke. Exposure to cigarette smoke or irritating chemicals will make bronchitis worse. If you are a smoker, consider using nicotine gum or skin patches to help control withdrawal symptoms. Quitting smoking will help your lungs heal faster.   Reduce the chances of another bout of acute bronchitis by washing your hands frequently, avoiding people with cold symptoms, and trying not to touch your hands to your mouth, nose, or eyes.   Keep all follow-up visits as directed by your health care provider.  SEEK MEDICAL CARE IF: Your symptoms do not improve after 1 week of treatment.  SEEK IMMEDIATE MEDICAL CARE IF:  You develop an increased fever or chills.   You have chest pain.   You have severe shortness of breath.  You have bloody sputum.   You develop dehydration.  You faint or repeatedly feel like you are going to pass out.  You develop repeated vomiting.  You develop a severe headache. MAKE SURE YOU:   Understand these instructions.  Will watch your condition.  Will get help right away if you are not doing well or get worse.   This information is not intended to replace advice given to you by your health care provider. Make sure you discuss any questions you have with your health care provider.   Document Released: 12/25/2004 Document Revised: 12/08/2014 Document Reviewed: 05/10/2013 Elsevier Interactive Patient Education [employer].

## 2016-08-07 NOTE — Progress Notes (Signed)
Pre visit review using our clinic review tool, if applicable. No additional management support is needed unless otherwise documented below in the visit note. 

## 2016-08-07 NOTE — Progress Notes (Signed)
Subjective:  Patient ID: Jamie [name], female    DOB: 01/08/1933  Age: 82 y.o. MRN: [id number]  CC: Cough (x3weeks)   Cough  This is a new problem. The current episode started 1 to 4 weeks ago. The problem has been gradually worsening. The problem occurs constantly. The cough is productive of sputum. Associated symptoms include chills, postnasal drip, shortness of breath and wheezing. Pertinent negatives include no chest pain, ear congestion, fever, headaches, heartburn, nasal congestion, rhinorrhea or sore throat. The symptoms are aggravated by lying down and cold air. She has tried OTC cough suppressant and prescription cough suppressant (use of hycodan at bedtime prn, last time used it caused nervousness.) for the symptoms. The treatment provided no relief. Her past medical history is significant for bronchitis and environmental allergies.    Outpatient Medications Prior to Visit  Medication Sig Dispense Refill  . ALPRAZolam (XANAX) 0.5 MG tablet TAKE 1 TABLET BY MOUTH 3 TIMES A DAY AS NEEDED FOR ANXIETY 90 tablet 0  . amLODipine (NORVASC) 5 MG tablet TAKE 1 TABLET BY MOUTH DAILY 30 tablet 0  . atorvastatin (LIPITOR) 20 MG tablet TAKE 1 TABLET (20 MG TOTAL) BY MOUTH DAILY. 90 tablet 2  . clotrimazole-betamethasone (LOTRISONE) cream Apply 1 application topically 2 (two) times daily as needed (rash). 30 g 3  . furosemide (LASIX) 40 MG tablet TAKE 1 TABLET (40 MG TOTAL) BY MOUTH DAILY. 90 tablet 1  . promethazine (PHENERGAN) 12.5 MG tablet TAKE 1 TABLET (12.5 MG TOTAL) BY MOUTH EVERY 8 (EIGHT) HOURS AS NEEDED FOR NAUSEA OR VOMITING. 20 tablet 0  . ranitidine (ZANTAC) 150 MG tablet TAKE 1 TABLET BY MOUTH TWICE A DAY 180 tablet 1  . triamcinolone cream (KENALOG) 0.5 % Apply 1 application topically 2 (two) times daily. To affected areas. 30 g 3  . valsartan (DIOVAN) 320 MG tablet TAKE 1 TABLET BY MOUTH DAILY 90 tablet 1  . valsartan (DIOVAN) 320 MG tablet TAKE 1 TABLET BY MOUTH DAILY 90  tablet 0  . predniSONE (DELTASONE) 10 MG tablet 3 tabs by mouth per day for 3 days,2tabs per day for 3 days,1tab per day for 3 days 18 tablet 0   No facility-administered medications prior to visit.     ROS See HPI  CXR reviewed: no acute finding.  Objective:  BP 128/82   Pulse 90   Temp 97.8 F (36.6 C)   Wt 164 lb (74.4 kg)   SpO2 98%   BMI 30.00 kg/m   BP Readings from Last 3 Encounters:  08/07/16 128/82  07/02/16 (!) 152/68  06/20/16 (!) 150/66    Wt Readings from Last 3 Encounters:  08/07/16 164 lb (74.4 kg)  06/20/16 161 lb (73 kg)  06/17/16 164 lb 6.4 oz (74.6 kg)    Physical Exam  Constitutional: She is oriented to person, place, and time. No distress.  HENT:  Nose: Nose normal.  Mouth/Throat: No oropharyngeal exudate.  Eyes: No scleral icterus.  Neck: Normal range of motion. Neck supple. No thyromegaly present.  Cardiovascular: Normal rate and normal heart sounds.   Pulmonary/Chest: Effort normal and breath sounds normal. She exhibits no tenderness.  Musculoskeletal: She exhibits no edema.  Lymphadenopathy:    She has no cervical adenopathy.  Neurological: She is alert and oriented to person, place, and time.  Skin: Skin is dry.  Psychiatric: She has a normal mood and affect. Her behavior is normal.  Vitals reviewed.   Assessment & Plan:   Jamie [name] was seen  today for cough.  Diagnoses and all orders for this visit:  SOB (shortness of breath) -     DG Chest 2 View; Future -     benzonatate (TESSALON) 100 MG capsule; Take 1 capsule (100 mg total) by mouth 3 (three) times daily as needed for cough. -     guaiFENesin (MUCINEX) 600 MG 12 hr tablet; Take 1 tablet (600 mg total) by mouth 2 (two) times daily as needed for cough or to loosen phlegm. -     albuterol (PROVENTIL HFA;VENTOLIN HFA) 108 (90 Base) MCG/ACT inhaler; Inhale 2 puffs into the lungs every 6 (six) hours as needed for wheezing or shortness of breath.  Acute bronchitis, unspecified  organism -     DG Chest 2 View; Future -     benzonatate (TESSALON) 100 MG capsule; Take 1 capsule (100 mg total) by mouth 3 (three) times daily as needed for cough. -     guaiFENesin (MUCINEX) 600 MG 12 hr tablet; Take 1 tablet (600 mg total) by mouth 2 (two) times daily as needed for cough or to loosen phlegm. -     albuterol (PROVENTIL HFA;VENTOLIN HFA) 108 (90 Base) MCG/ACT inhaler; Inhale 2 puffs into the lungs every 6 (six) hours as needed for wheezing or shortness of breath.   I have discontinued Jamie [name]'s predniSONE. I am also having her start on benzonatate, guaiFENesin, and albuterol. Additionally, I am having her maintain her triamcinolone cream, ranitidine, ALPRAZolam, clotrimazole-betamethasone, valsartan, promethazine, valsartan, furosemide, amLODipine, and atorvastatin.  Meds ordered this encounter  Medications  . benzonatate (TESSALON) 100 MG capsule    Sig: Take 1 capsule (100 mg total) by mouth 3 (three) times daily as needed for cough.    Dispense:  20 capsule    Refill:  0    Order Specific Question:   Supervising Provider    Answer:   Cassandria Anger [[id number]]  . guaiFENesin (MUCINEX) 600 MG 12 hr tablet    Sig: Take 1 tablet (600 mg total) by mouth 2 (two) times daily as needed for cough or to loosen phlegm.    Dispense:  14 tablet    Refill:  0    Order Specific Question:   Supervising Provider    Answer:   Cassandria Anger [[id number]]  . albuterol (PROVENTIL HFA;VENTOLIN HFA) 108 (90 Base) MCG/ACT inhaler    Sig: Inhale 2 puffs into the lungs every 6 (six) hours as needed for wheezing or shortness of breath.    Dispense:  1 Inhaler    Refill:  0    Order Specific Question:   Supervising Provider    Answer:   Cassandria Anger [[id number]]    Follow-up: Return if symptoms worsen or fail to improve.  Wilfred Lacy, NP

## 2016-08-14 ENCOUNTER — Other Ambulatory Visit: Payer: Self-pay | Admitting: Family

## 2016-08-14 ENCOUNTER — Encounter: Payer: Medicare Other | Admitting: Family

## 2016-08-15 ENCOUNTER — Other Ambulatory Visit (INDEPENDENT_AMBULATORY_CARE_PROVIDER_SITE_OTHER): Payer: Medicare Other

## 2016-08-15 ENCOUNTER — Ambulatory Visit (INDEPENDENT_AMBULATORY_CARE_PROVIDER_SITE_OTHER): Payer: Medicare Other | Admitting: Internal Medicine

## 2016-08-15 ENCOUNTER — Encounter: Payer: Self-pay | Admitting: Internal Medicine

## 2016-08-15 VITALS — BP 142/56 | HR 88 | Temp 98.5°F | Ht 62.0 in | Wt 161.5 lb

## 2016-08-15 DIAGNOSIS — Z0000 Encounter for general adult medical examination without abnormal findings: Secondary | ICD-10-CM | POA: Diagnosis not present

## 2016-08-15 DIAGNOSIS — I10 Essential (primary) hypertension: Secondary | ICD-10-CM

## 2016-08-15 DIAGNOSIS — Z23 Encounter for immunization: Secondary | ICD-10-CM

## 2016-08-15 DIAGNOSIS — E785 Hyperlipidemia, unspecified: Secondary | ICD-10-CM | POA: Diagnosis not present

## 2016-08-15 LAB — LIPID PANEL
CHOLESTEROL: 139 mg/dL (ref 0–200)
HDL: 46.2 mg/dL (ref >39.00)
LDL Cholesterol: 81 mg/dL (ref 0–99)
NonHDL: 92.35
TRIGLYCERIDES: 58 mg/dL (ref 0.0–149.0)
Total CHOL/HDL Ratio: 3
VLDL: 11.6 mg/dL (ref 0.0–40.0)

## 2016-08-15 LAB — COMPREHENSIVE METABOLIC PANEL
ALBUMIN: 4 g/dL (ref 3.5–5.2)
ALK PHOS: 177 U/L — AB (ref 39–117)
ALT: 12 U/L (ref 0–35)
AST: 14 U/L (ref 0–37)
BILIRUBIN TOTAL: 0.7 mg/dL (ref 0.2–1.2)
BUN: 12 mg/dL (ref 6–23)
CALCIUM: 9 mg/dL (ref 8.4–10.5)
CO2: 26 meq/L (ref 19–32)
CREATININE: 0.82 mg/dL (ref 0.40–1.20)
Chloride: 107 mEq/L (ref 96–112)
GFR: 85.95 mL/min (ref >60.00)
Glucose, Bld: 105 mg/dL — ABNORMAL HIGH (ref 70–99)
Potassium: 3.9 mEq/L (ref 3.5–5.1)
Sodium: 141 mEq/L (ref 135–145)
Total Protein: 7.4 g/dL (ref 6.0–8.3)

## 2016-08-15 NOTE — Progress Notes (Signed)
Pre visit review using our clinic review tool, if applicable. No additional management support is needed unless otherwise documented below in the visit note. 

## 2016-08-15 NOTE — Assessment & Plan Note (Signed)
Taking lipitor without side effects, checking lipid panel and adjust as needed.

## 2016-08-15 NOTE — Assessment & Plan Note (Signed)
Flu shot given today, gets mammogram end of the month, aged out of colonoscopy. Shingles and pneumonia completed. Tetanus up to date. Counseled on sun safety and mole surveillance. Given 10 year screening recommendations.

## 2016-08-15 NOTE — Assessment & Plan Note (Signed)
BP at goal on lasix and amlodipine and valsartan daily. Checking CMP and adjust as needed.

## 2016-08-15 NOTE — Patient Instructions (Signed)
We will check the labs today and call you back with the results.   We have given you the flu shot today.   You are doing well so keep up the good work with your health.   Health Maintenance, Female Adopting a healthy lifestyle and getting preventive care can go a long way to promote health and wellness. Talk with your health care provider about what schedule of regular examinations is right for you. This is a good chance for you to check in with your provider about disease prevention and staying healthy. In between checkups, there are plenty of things you can do on your own. Experts have done a lot of research about which lifestyle changes and preventive measures are most likely to keep you healthy. Ask your health care provider for more information. WEIGHT AND DIET  Eat a healthy diet  Be sure to include plenty of vegetables, fruits, low-fat dairy products, and lean protein.  Do not eat a lot of foods high in solid fats, added sugars, or salt.  Get regular exercise. This is one of the most important things you can do for your health.  Most adults should exercise for at least 150 minutes each week. The exercise should increase your heart rate and make you sweat (moderate-intensity exercise).  Most adults should also do strengthening exercises at least twice a week. This is in addition to the moderate-intensity exercise.  Maintain a healthy weight  Body mass index (BMI) is a measurement that can be used to identify possible weight problems. It estimates body fat based on height and weight. Your health care provider can help determine your BMI and help you achieve or maintain a healthy weight.  For females 24 years of age and older:   A BMI below 18.5 is considered underweight.  A BMI of 18.5 to 24.9 is normal.  A BMI of 25 to 29.9 is considered overweight.  A BMI of 30 and above is considered obese.  Watch levels of cholesterol and blood lipids  You should start having your  blood tested for lipids and cholesterol at 80 years of age, then have this test every 5 years.  You may need to have your cholesterol levels checked more often if:  Your lipid or cholesterol levels are high.  You are older than 80 years of age.  You are at high risk for heart disease.  CANCER SCREENING   Lung Cancer  Lung cancer screening is recommended for adults 25-50 years old who are at high risk for lung cancer because of a history of smoking.  A yearly low-dose CT scan of the lungs is recommended for people who:  Currently smoke.  Have quit within the past 15 years.  Have at least a 30-pack-year history of smoking. A pack year is smoking an average of one pack of cigarettes a day for 1 year.  Yearly screening should continue until it has been 15 years since you quit.  Yearly screening should stop if you develop a health problem that would prevent you from having lung cancer treatment.  Breast Cancer  Practice breast self-awareness. This means understanding how your breasts normally appear and feel.  It also means doing regular breast self-exams. Let your health care provider know about any changes, no matter how small.  If you are in your 20s or 30s, you should have a clinical breast exam (CBE) by a health care provider every 1-3 years as part of a regular health exam.  If you  are 80 or older, have a CBE every year. Also consider having a breast X-ray (mammogram) every year.  If you have a family history of breast cancer, talk to your health care provider about genetic screening.  If you are at high risk for breast cancer, talk to your health care provider about having an MRI and a mammogram every year.  Breast cancer gene (BRCA) assessment is recommended for women who have family members with BRCA-related cancers. BRCA-related cancers include:  Breast.  Ovarian.  Tubal.  Peritoneal cancers.  Results of the assessment will determine the need for genetic  counseling and BRCA1 and BRCA2 testing. Cervical Cancer Your health care provider may recommend that you be screened regularly for cancer of the pelvic organs (ovaries, uterus, and vagina). This screening involves a pelvic examination, including checking for microscopic changes to the surface of your cervix (Pap test). You may be encouraged to have this screening done every 3 years, beginning at age 80.  For women ages 50-65, health care providers may recommend pelvic exams and Pap testing every 3 years, or they may recommend the Pap and pelvic exam, combined with testing for human papilloma virus (HPV), every 5 years. Some types of HPV increase your risk of cervical cancer. Testing for HPV may also be done on women of any age with unclear Pap test results.  Other health care providers may not recommend any screening for nonpregnant women who are considered low risk for pelvic cancer and who do not have symptoms. Ask your health care provider if a screening pelvic exam is right for you.  If you have had past treatment for cervical cancer or a condition that could lead to cancer, you need Pap tests and screening for cancer for at least 20 years after your treatment. If Pap tests have been discontinued, your risk factors (such as having a new sexual partner) need to be reassessed to determine if screening should resume. Some women have medical problems that increase the chance of getting cervical cancer. In these cases, your health care provider may recommend more frequent screening and Pap tests. Colorectal Cancer  This type of cancer can be detected and often prevented.  Routine colorectal cancer screening usually begins at 80 years of age and continues through 80 years of age.  Your health care provider may recommend screening at an earlier age if you have risk factors for colon cancer.  Your health care provider may also recommend using home test kits to check for hidden blood in the stool.  A  small camera at the end of a tube can be used to examine your colon directly (sigmoidoscopy or colonoscopy). This is done to check for the earliest forms of colorectal cancer.  Routine screening usually begins at age 80.  Direct examination of the colon should be repeated every 5-10 years through 80 years of age. However, you may need to be screened more often if early forms of precancerous polyps or small growths are found. Skin Cancer  Check your skin from head to toe regularly.  Tell your health care provider about any new moles or changes in moles, especially if there is a change in a mole's shape or color.  Also tell your health care provider if you have a mole that is larger than the size of a pencil eraser.  Always use sunscreen. Apply sunscreen liberally and repeatedly throughout the day.  Protect yourself by wearing long sleeves, pants, a wide-brimmed hat, and sunglasses whenever you are outside.  HEART DISEASE, DIABETES, AND HIGH BLOOD PRESSURE   High blood pressure causes heart disease and increases the risk of stroke. High blood pressure is more likely to develop in:  People who have blood pressure in the high end of the normal range (130-139/85-89 mm Hg).  People who are overweight or obese.  People who are African American.  If you are 75-57 years of age, have your blood pressure checked every 3-5 years. If you are 27 years of age or older, have your blood pressure checked every year. You should have your blood pressure measured twice--once when you are at a hospital or clinic, and once when you are not at a hospital or clinic. Record the average of the two measurements. To check your blood pressure when you are not at a hospital or clinic, you can use:  An automated blood pressure machine at a pharmacy.  A home blood pressure monitor.  If you are between 25 years and 46 years old, ask your health care provider if you should take aspirin to prevent strokes.  Have  regular diabetes screenings. This involves taking a blood sample to check your fasting blood sugar level.  If you are at a normal weight and have a low risk for diabetes, have this test once every three years after 80 years of age.  If you are overweight and have a high risk for diabetes, consider being tested at a younger age or more often. PREVENTING INFECTION  Hepatitis B  If you have a higher risk for hepatitis B, you should be screened for this virus. You are considered at high risk for hepatitis B if:  You were born in a country where hepatitis B is common. Ask your health care provider which countries are considered high risk.  Your parents were born in a high-risk country, and you have not been immunized against hepatitis B (hepatitis B vaccine).  You have HIV or AIDS.  You use needles to inject street drugs.  You live with someone who has hepatitis B.  You have had sex with someone who has hepatitis B.  You get hemodialysis treatment.  You take certain medicines for conditions, including cancer, organ transplantation, and autoimmune conditions. Hepatitis C  Blood testing is recommended for:  Everyone born from [birth year] through 1963.  Anyone with known risk factors for hepatitis C. Sexually transmitted infections (STIs)  You should be screened for sexually transmitted infections (STIs) including gonorrhea and chlamydia if:  You are sexually active and are younger than 80 years of age.  You are older than 80 years of age and your health care provider tells you that you are at risk for this type of infection.  Your sexual activity has changed since you were last screened and you are at an increased risk for chlamydia or gonorrhea. Ask your health care provider if you are at risk.  If you do not have HIV, but are at risk, it may be recommended that you take a prescription medicine daily to prevent HIV infection. This is called pre-exposure prophylaxis (PrEP). You are  considered at risk if:  You are sexually active and do not regularly use condoms or know the HIV status of your partner(s).  You take drugs by injection.  You are sexually active with a partner who has HIV. Talk with your health care provider about whether you are at high risk of being infected with HIV. If you choose to begin PrEP, you should first be tested for HIV. You  should then be tested every 3 months for as long as you are taking PrEP.  PREGNANCY   If you are premenopausal and you may become pregnant, ask your health care provider about preconception counseling.  If you may become pregnant, take 400 to 800 micrograms (mcg) of folic acid every day.  If you want to prevent pregnancy, talk to your health care provider about birth control (contraception). OSTEOPOROSIS AND MENOPAUSE   Osteoporosis is a disease in which the bones lose minerals and strength with aging. This can result in serious bone fractures. Your risk for osteoporosis can be identified using a bone density scan.  If you are 66 years of age or older, or if you are at risk for osteoporosis and fractures, ask your health care provider if you should be screened.  Ask your health care provider whether you should take a calcium or vitamin D supplement to lower your risk for osteoporosis.  Menopause may have certain physical symptoms and risks.  Hormone replacement therapy may reduce some of these symptoms and risks. Talk to your health care provider about whether hormone replacement therapy is right for you.  HOME CARE INSTRUCTIONS   Schedule regular health, dental, and eye exams.  Stay current with your immunizations.   Do not use any tobacco products including cigarettes, chewing tobacco, or electronic cigarettes.  If you are pregnant, do not drink alcohol.  If you are breastfeeding, limit how much and how often you drink alcohol.  Limit alcohol intake to no more than 1 drink per day for nonpregnant women. One  drink equals 12 ounces of beer, 5 ounces of wine, or 1 ounces of hard liquor.  Do not use street drugs.  Do not share needles.  Ask your health care provider for help if you need support or information about quitting drugs.  Tell your health care provider if you often feel depressed.  Tell your health care provider if you have ever been abused or do not feel safe at home.   This information is not intended to replace advice given to you by your health care provider. Make sure you discuss any questions you have with your health care provider.   Document Released: 06/02/2011 Document Revised: 12/08/2014 Document Reviewed: 10/19/2013 Elsevier Interactive Patient Education [employer].

## 2016-08-15 NOTE — Progress Notes (Signed)
   Subjective:    Patient ID: Jamie [name], female    DOB: 08-Aug-1933, 82 y.o.   MRN: [id number]  HPI Here for medicare wellness and CPE, no new complaints. Please see A/P for status and treatment of chronic medical problems.   Diet: heart healthy Physical activity: sedentary Depression/mood screen: negative Hearing: intact to whispered voice Visual acuity: grossly normal with lens, performs annual eye exam  ADLs: capable Fall risk: none Home safety: good Cognitive evaluation: intact to orientation, naming, recall and repetition EOL planning: adv directives discussed  I have personally reviewed and have noted 1. The patient's medical and social history - reviewed today no changes 2. Their use of alcohol, tobacco or illicit drugs 3. Their current medications and supplements 4. The patient's functional ability including ADL's, fall risks, home safety risks and hearing or visual impairment. 5. Diet and physical activities 6. Evidence for depression or mood disorders 7. Care team reviewed and updated (available in snapshot)  Review of Systems  Constitutional: Negative for activity change, appetite change and unexpected weight change.  HENT: Negative.   Eyes: Negative.   Respiratory: Negative for cough, chest tightness, shortness of breath and wheezing.   Cardiovascular: Negative for chest pain, palpitations and leg swelling.  Gastrointestinal: Negative for abdominal distention, abdominal pain, constipation, diarrhea and nausea.  Musculoskeletal: Positive for arthralgias. Negative for back pain and gait problem.  Skin: Negative.   Neurological: Negative for dizziness, weakness, light-headedness and headaches.  Psychiatric/Behavioral: Negative.       Objective:   Physical Exam  Constitutional: She is oriented to person, place, and time. She appears well-developed and well-nourished.  HENT:  Head: Normocephalic and atraumatic.  Eyes: EOM are normal.  Neck: Normal range of  motion. Neck supple.  Cardiovascular: Normal rate and regular rhythm.   Pulmonary/Chest: Effort normal and breath sounds normal. No respiratory distress. She has no wheezes.  Abdominal: Soft. Bowel sounds are normal. She exhibits no distension. There is no tenderness. There is no rebound.  Musculoskeletal: She exhibits no edema.  Neurological: She is alert and oriented to person, place, and time.  Skin: Skin is warm and dry.  Psychiatric: She has a normal mood and affect.   Vitals:   08/15/16 0804  BP: (!) 142/56  Pulse: 88  Temp: 98.5 F (36.9 C)  TempSrc: Oral  SpO2: 96%  Weight: 161 lb 8 oz (73.3 kg)  Height: 5\' 2"  (1.575 m)      Assessment & Plan:  Flu shot given at visit.

## 2016-09-17 ENCOUNTER — Telehealth: Payer: Self-pay | Admitting: Internal Medicine

## 2016-09-17 NOTE — Telephone Encounter (Signed)
Patient states that Dr. Sharlet Salina checked her for coughing, congestion and wheezing last time she was in.  Patient states she is not any better.  She is requesting a follow up call in regard.

## 2016-09-17 NOTE — Telephone Encounter (Signed)
Spoke to patient and she is saying that she is still coughing since the last visit a month ago. Patient is going to call and schedule an appt.

## 2016-09-24 ENCOUNTER — Other Ambulatory Visit: Payer: Self-pay | Admitting: Family

## 2016-09-27 ENCOUNTER — Ambulatory Visit (INDEPENDENT_AMBULATORY_CARE_PROVIDER_SITE_OTHER): Payer: Medicare Other | Admitting: Family Medicine

## 2016-09-27 VITALS — BP 144/62 | HR 93 | Temp 98.5°F | Resp 16 | Wt 160.0 lb

## 2016-09-27 DIAGNOSIS — J40 Bronchitis, not specified as acute or chronic: Secondary | ICD-10-CM

## 2016-09-27 MED ORDER — PREDNISONE 20 MG PO TABS: 40.0000 mg | ORAL_TABLET | Freq: Every day | ORAL | 0 refills | Status: AC

## 2016-09-27 NOTE — Progress Notes (Signed)
Pre visit review using our clinic review tool, if applicable. No additional management support is needed unless otherwise documented below in the visit note. 

## 2016-09-27 NOTE — Progress Notes (Signed)
Chief Complaint  Patient presents with  . Cough    chest congestion, some production, wheezing, SOB, denies sore throat, headache, fever.     Jamie [name] here for URI complaints.  Duration: 1 month  Associated symptoms: Productive cough (mildly), wheezing; worse at night, will have intermittent SOB No hx of asthma or COPD, never had any lung testing Denies: sinus congestion, sinus pain, rhinorrhea, itchy watery eyes, ear pain, ear drainage, sore throat and chest pain Treatment to date: Albuterol, has been using daily that is very helpful; she has not been on abx Sick contacts: No  She did have a normal CXR done early in the month or perhaps in Sept.  ROS:  Const: Denies fevers HEENT: As noted in HPI Lungs: +cough, +intermittent SOB  Past Medical History:  Diagnosis Date  . ANEMIA, IRON DEFICIENCY 09/10/2007  . CATARACT EXTRACTION, HX OF 09/10/2007  . DYSPEPSIA, HX OF 09/10/2007  . HAIR LOSS 03/21/2009  . HAY FEVER 09/10/2007  . HEARING LOSS, SENSORINEURAL 09/10/2007  . HYPERLIPIDEMIA 09/10/2007  . HYPERTENSION 09/10/2007  . LOW BACK PAIN 05/10/2008  . Pain in joint, hand 03/01/2008  . PEPTIC ULCER DISEASE 09/10/2007  . POLYP, COLON 09/10/2007  . POLYPECTOMY, HX OF 09/10/2007  . SHINGLES 08/13/2009  . SHOULDER PAIN, LEFT 02/12/2008  . TAH/BSO, HX OF 09/10/2007   Family History  Problem Relation Age of Onset  . Hypertension Mother   . Coronary artery disease Mother   . Heart disease Mother     CHF  . Cancer Neg Hx     breast or colon  . Diabetes Neg Hx     BP (!) 144/62   Pulse 93   Temp 98.5 F (36.9 C) (Oral)   Resp 16   Wt 160 lb (72.6 kg)   SpO2 98%   BMI 29.26 kg/m  General: Awake, alert, appears stated age HEENT: AT, [place], canal 100% obstructed with cerumen on R, 80% obstructed with , nares patent w/o discharge, pharynx pink and without exudates, MMM Neck: No masses or asymmetry Heart: RRR, no murmurs, no bruits, no LE edema Lungs: diffuse  expiratory wheezing, no accessory muscle use Psych: Age appropriate judgment and insight, normal mood and affect  Wheezy bronchitis - Plan: predniSONE (DELTASONE) 20 MG tablet  Orders as above. I would like her to f/u with her PCP to discuss this cough. I do not think a bacterial infection is causing this, but underlying lung disease could be- PFT would be nice to have. Could trial daily inhaled corticosteroid. Will defer to PCP for this work up, however. Pt voiced understanding and agreement to the plan.  Melvin, DO 09/27/16 10:19 AM

## 2016-09-27 NOTE — Patient Instructions (Addendum)
I want you to follow up with Dr. Sharlet Salina this coming week. Let her know how long your cough and breathing issues have been going on and let her know that you feel better when you use the albuterol inhaler.  Go to ER if you start having fevers, chest pain, or difficulty breathing that is not relieved by your inhaler.

## 2016-10-03 ENCOUNTER — Ambulatory Visit (INDEPENDENT_AMBULATORY_CARE_PROVIDER_SITE_OTHER): Payer: Medicare Other | Admitting: Internal Medicine

## 2016-10-03 ENCOUNTER — Encounter: Payer: Self-pay | Admitting: Internal Medicine

## 2016-10-03 DIAGNOSIS — R05 Cough: Secondary | ICD-10-CM | POA: Diagnosis not present

## 2016-10-03 DIAGNOSIS — R059 Cough, unspecified: Secondary | ICD-10-CM

## 2016-10-03 NOTE — Progress Notes (Signed)
   Subjective:    Patient ID: Jamie [name], female    DOB: 08-08-1933, 82 y.o.   MRN: [id number]  HPI The patient is an 82 YO female coming in for cough follow up. She was seen and given prednisone for the cough. She is now done with the prednisone and is feeling markedly improved. She is not having any SOB. She did use the albuterol inhaler before the prednisone but has not needed it since that time. She is feeling slightly more arthritis symptoms since being off the prednisone. Cough is 95% better. No fevers or chills. No sinus drainage.   Review of Systems  Constitutional: Negative.   HENT: Negative.   Eyes: Negative.   Respiratory: Negative for cough, chest tightness, shortness of breath and wheezing.   Cardiovascular: Negative.   Gastrointestinal: Negative.   Musculoskeletal: Positive for arthralgias.      Objective:   Physical Exam  Constitutional: She appears well-developed and well-nourished.  HENT:  Head: Normocephalic and atraumatic.  Mild erythema in the oropharynx  Eyes: EOM are normal.  Neck: Normal range of motion.  Cardiovascular: Normal rate and regular rhythm.   Pulmonary/Chest: Effort normal and breath sounds normal. No respiratory distress. She has no wheezes. She has no rales.  Abdominal: Soft. She exhibits no distension. There is no tenderness. There is no rebound.  Lymphadenopathy:    She has no cervical adenopathy.   Vitals:   10/03/16 1553  BP: (!) 136/58  Pulse: 78  Resp: 16  Temp: 97.9 F (36.6 C)  TempSrc: Oral  SpO2: 98%  Weight: 159 lb 6.4 oz (72.3 kg)  Height: 5\' 3"  (1.6 m)      Assessment & Plan:

## 2016-10-03 NOTE — Patient Instructions (Signed)
You can keep the inhaler and it expires in March of 2019 so you can keep it and use it as needed.   We do not need any imaging or labs today.   Call us back if the cough or breathing problems return.

## 2016-10-03 NOTE — Progress Notes (Signed)
Pre visit review using our clinic review tool, if applicable. No additional management support is needed unless otherwise documented below in the visit note. 

## 2016-10-03 NOTE — Assessment & Plan Note (Addendum)
Can use albuterol if needed. Taking prednisone off her list. Talked to her about if there is recurrence we can consider PFTs since she has had this cough off and on. She is a past smoker. She states her GERD is well controlled now which can also be a common cause of chronic cough.

## 2016-10-07 ENCOUNTER — Encounter: Payer: Self-pay | Admitting: Internal Medicine

## 2016-11-05 ENCOUNTER — Encounter: Payer: Self-pay | Admitting: Family

## 2016-11-05 ENCOUNTER — Ambulatory Visit (INDEPENDENT_AMBULATORY_CARE_PROVIDER_SITE_OTHER): Payer: Medicare Other | Admitting: Family

## 2016-11-05 VITALS — BP 148/64 | HR 74 | Temp 98.2°F | Resp 14 | Ht 63.0 in | Wt 162.0 lb

## 2016-11-05 DIAGNOSIS — M19049 Primary osteoarthritis, unspecified hand: Secondary | ICD-10-CM

## 2016-11-05 DIAGNOSIS — M25532 Pain in left wrist: Secondary | ICD-10-CM | POA: Insufficient documentation

## 2016-11-05 DIAGNOSIS — M659 Synovitis and tenosynovitis, unspecified: Secondary | ICD-10-CM | POA: Diagnosis not present

## 2016-11-05 DIAGNOSIS — M79602 Pain in left arm: Secondary | ICD-10-CM | POA: Insufficient documentation

## 2016-11-05 NOTE — Assessment & Plan Note (Addendum)
Re-inflammation with recommendation for conservative treatment with ice, Tylenol and bracing. Follow up if symptoms worsen or do not improve.

## 2016-11-05 NOTE — Assessment & Plan Note (Signed)
Symptoms and exam consistent with tenosynovitis of the extensor pollicis on the left and re-inflammation of the right. Treat conservatively with ice, Tylenol and bracing. Follow up if symptoms worsen or do not improve with consideration for possible cortisone injection.

## 2016-11-05 NOTE — Progress Notes (Signed)
Subjective:    Patient ID: Jamie [name], female    DOB: 04-23-1933, 82 y.o.   MRN: [id number]  Chief Complaint  Patient presents with  . Wrist Pain    bilateral wrist pain and swelling, x3 weeks    HPI:  Jamie [name] is a 82 y.o. female who  has a past medical history of ANEMIA, IRON DEFICIENCY (09/10/2007); CATARACT EXTRACTION, HX OF (09/10/2007); DYSPEPSIA, HX OF (09/10/2007); HAIR LOSS (03/21/2009); HAY FEVER (09/10/2007); HEARING LOSS, SENSORINEURAL (09/10/2007); HYPERLIPIDEMIA (09/10/2007); HYPERTENSION (09/10/2007); LOW BACK PAIN (05/10/2008); Pain in joint, hand (03/01/2008); PEPTIC ULCER DISEASE (09/10/2007); POLYP, COLON (09/10/2007); POLYPECTOMY, HX OF (09/10/2007); SHINGLES (08/13/2009); SHOULDER PAIN, LEFT (02/12/2008); and TAH/BSO, HX OF (09/10/2007). and presents today for an office visit.   This is a new problem. Associated symptom of pain located in her left wrist has been going on for about 3 weeks. Pain is described as achy and timing of the symptoms is worse in the morning and usually improves over the course of the day. Right hand dominant. Does have some difficulty with fine motor movements. Denies any trauma or injury. Previously treated for right osteoarthritis. Modifying factors include Tylenol and muscle ache rub which helped very little.   Allergies  Allergen Reactions  . Latex Itching and Rash      Outpatient Medications Prior to Visit  Medication Sig Dispense Refill  . albuterol (PROVENTIL HFA;VENTOLIN HFA) 108 (90 Base) MCG/ACT inhaler Inhale 2 puffs into the lungs every 6 (six) hours as needed for wheezing or shortness of breath. 1 Inhaler 0  . ALPRAZolam (XANAX) 0.5 MG tablet TAKE 1 TABLET BY MOUTH 3 TIMES A DAY AS NEEDED FOR ANXIETY 90 tablet 0  . amLODipine (NORVASC) 5 MG tablet TAKE 1 TABLET EVERY DAY 90 tablet 2  . atorvastatin (LIPITOR) 20 MG tablet TAKE 1 TABLET (20 MG TOTAL) BY MOUTH DAILY. 90 tablet 2  . clotrimazole-betamethasone  (LOTRISONE) cream Apply 1 application topically 2 (two) times daily as needed (rash). 30 g 3  . furosemide (LASIX) 40 MG tablet TAKE 1 TABLET (40 MG TOTAL) BY MOUTH DAILY. 90 tablet 1  . guaiFENesin (MUCINEX) 600 MG 12 hr tablet Take 1 tablet (600 mg total) by mouth 2 (two) times daily as needed for cough or to loosen phlegm. 14 tablet 0  . promethazine (PHENERGAN) 12.5 MG tablet TAKE 1 TABLET (12.5 MG TOTAL) BY MOUTH EVERY 8 (EIGHT) HOURS AS NEEDED FOR NAUSEA OR VOMITING. 20 tablet 0  . ranitidine (ZANTAC) 150 MG tablet TAKE 1 TABLET BY MOUTH TWICE A DAY 180 tablet 1  . triamcinolone cream (KENALOG) 0.5 % Apply 1 application topically 2 (two) times daily. To affected areas. 30 g 3  . valsartan (DIOVAN) 320 MG tablet TAKE 1 TABLET BY MOUTH DAILY 90 tablet 0  . valsartan (DIOVAN) 320 MG tablet TAKE 1 TABLET BY MOUTH DAILY 90 tablet 1   No facility-administered medications prior to visit.       Past Surgical History:  Procedure Laterality Date  . CATARACT EXTRACTION    . IRRIGATION AND DEBRIDEMENT SEBACEOUS CYST    . POLYPECTOMY    . TOTAL ABDOMINAL HYSTERECTOMY W/ BILATERAL SALPINGOOPHORECTOMY        Past Medical History:  Diagnosis Date  . ANEMIA, IRON DEFICIENCY 09/10/2007  . CATARACT EXTRACTION, HX OF 09/10/2007  . DYSPEPSIA, HX OF 09/10/2007  . HAIR LOSS 03/21/2009  . HAY FEVER 09/10/2007  . HEARING LOSS, SENSORINEURAL 09/10/2007  . HYPERLIPIDEMIA 09/10/2007  . HYPERTENSION  09/10/2007  . LOW BACK PAIN 05/10/2008  . Pain in joint, hand 03/01/2008  . PEPTIC ULCER DISEASE 09/10/2007  . POLYP, COLON 09/10/2007  . POLYPECTOMY, HX OF 09/10/2007  . SHINGLES 08/13/2009  . SHOULDER PAIN, LEFT 02/12/2008  . TAH/BSO, HX OF 09/10/2007    Review of Systems  Constitutional: Negative for chills and fever.  Musculoskeletal:       Positive for bilateral wrist pain.  Neurological: Positive for weakness. Negative for numbness.      Objective:    BP (!) 148/64 (BP Location: Left Arm,  Patient Position: Sitting, Cuff Size: Normal)   Pulse 74   Temp 98.2 F (36.8 C) (Oral)   Resp 14   Ht 5\' 3"  (1.6 m)   Wt 162 lb (73.5 kg)   SpO2 98%   BMI 28.70 kg/m  Nursing note and vital signs reviewed.  Physical Exam  Constitutional: She is oriented to person, place, and time. She appears well-developed and well-nourished. No distress.  Cardiovascular: Normal rate, regular rhythm, normal heart sounds and intact distal pulses.   Pulmonary/Chest: Effort normal and breath sounds normal.  Musculoskeletal:  Left wrists - Mild edema with no discoloration or deformity. Tenderness elicited over extensor pollicis tendon and navicular in anatomical snuff box. No crepitus or deformity. Range of motion within normal limits. Discomfort with resisted thumb extension. Negative Finkelsteins. Capillary refill intact and appropriate.    Right wrist - No obvious deformity, discoloration or edema. Mild tenderness over CMC joint and extensor tendon with no crepitus or deformity. Range of motion and strength are normal.   Neurological: She is alert and oriented to person, place, and time.  Skin: Skin is warm and dry.  Psychiatric: She has a normal mood and affect. Her behavior is normal. Judgment and thought content normal.       Assessment & Plan:   Problem List Items Addressed This Visit      Musculoskeletal and Integument   CMC arthritis - Primary    Re-inflammation with recommendation for conservative treatment with ice, Tylenol and bracing. Follow up if symptoms worsen or do not improve.       Tenosynovitis of thumb    Symptoms and exam consistent with tenosynovitis of the extensor pollicis on the left and re-inflammation of the right. Treat conservatively with ice, Tylenol and bracing. Follow up if symptoms worsen or do not improve with consideration for possible cortisone injection.           I am having Jamie [name] maintain her triamcinolone cream, ranitidine, ALPRAZolam,  clotrimazole-betamethasone, promethazine, furosemide, atorvastatin, guaiFENesin, albuterol, amLODipine, and valsartan.   Follow-up: Return in about 1 month (around 12/06/2016), or if symptoms worsen or fail to improve.  Mauricio Po, FNP

## 2016-11-05 NOTE — Patient Instructions (Signed)
Thank you for choosing [facility].  SUMMARY AND INSTRUCTIONS:  Ice x 20 minutes every 2 hours and after activity.  Bracing as discussed.  Medication:  Tylenol 650 mg 3 times daily for 5 days.  Pennsaid 2x daily 1/2 pack to the site.  Your prescription(s) have been submitted to your pharmacy or been printed and provided for you. Please take as directed and contact our office if you believe you are having problem(s) with the medication(s) or have any questions.   Follow up:  If your symptoms worsen or fail to improve, please contact our office for further instruction, or in case of emergency go directly to the emergency room at the closest medical facility.

## 2016-11-18 ENCOUNTER — Other Ambulatory Visit: Payer: Self-pay | Admitting: Family Medicine

## 2016-11-21 ENCOUNTER — Other Ambulatory Visit: Payer: Self-pay | Admitting: *Deleted

## 2016-11-21 DIAGNOSIS — R0602 Shortness of breath: Secondary | ICD-10-CM

## 2016-11-21 DIAGNOSIS — J209 Acute bronchitis, unspecified: Secondary | ICD-10-CM

## 2016-11-21 MED ORDER — ALBUTEROL SULFATE HFA 108 (90 BASE) MCG/ACT IN AERS: 2.0000 | INHALATION_SPRAY | Freq: Four times a day (QID) | RESPIRATORY_TRACT | 0 refills | Status: DC | PRN

## 2016-11-21 NOTE — Telephone Encounter (Signed)
Pt left msg on triage needing to get a refill on her albuterol inhaler sent to [facility]. Sent rx electronically...Johny Chess

## 2016-11-22 NOTE — Progress Notes (Signed)
[name] [place], [place] [address] Phone: [phone] Subjective:    I'm seeing this patient by the request  of:    CC: Pain in hands and wrists  [id number]  Jamie [name] is a 82 y.o. female coming in with complaint of hand and wrist pain. Another Provider Recently Diagnosed with More of the [facility] Arthritis. Patient States Bilateral hand pain. Seems to be around the thumbs. Making it difficult to do such things as dressing even. Feels like she is having increasing weakness. Was seen greater than to almost 3 years ago and was given an injection previously. Patient feels that unfortunately now the pain is more severe than it has ever been.     Past Medical History:  Diagnosis Date  . ANEMIA, IRON DEFICIENCY 09/10/2007  . CATARACT EXTRACTION, HX OF 09/10/2007  . DYSPEPSIA, HX OF 09/10/2007  . HAIR LOSS 03/21/2009  . HAY FEVER 09/10/2007  . HEARING LOSS, SENSORINEURAL 09/10/2007  . HYPERLIPIDEMIA 09/10/2007  . HYPERTENSION 09/10/2007  . LOW BACK PAIN 05/10/2008  . Pain in joint, hand 03/01/2008  . PEPTIC ULCER DISEASE 09/10/2007  . POLYP, COLON 09/10/2007  . POLYPECTOMY, HX OF 09/10/2007  . SHINGLES 08/13/2009  . SHOULDER PAIN, LEFT 02/12/2008  . TAH/BSO, HX OF 09/10/2007   Past Surgical History:  Procedure Laterality Date  . CATARACT EXTRACTION    . IRRIGATION AND DEBRIDEMENT SEBACEOUS CYST    . POLYPECTOMY    . TOTAL ABDOMINAL HYSTERECTOMY W/ BILATERAL SALPINGOOPHORECTOMY     Social History   Social History  . Marital status: Married    Spouse name: N/A  . Number of children: 4  . Years of education: N/A   Occupational History  [employer], retire 1998 after 30 yrs    Social History Main Topics  . Smoking status: Former Smoker    Packs/day: 0.20    Years: 25.00    Types: Cigarettes    Quit date: 12/01/1989  . Smokeless tobacco: Never Used     Comment: smoked a long time ago; Quit  90 or 91/ number is approximate  . Alcohol use No  . Drug use: Unknown  . Sexual activity: Not on file   Other Topics Concern  . Not on file   Social History Narrative   3 sons, 54,59, 65 and 1 daughter 73, 4 grandchildren   SO with multiple medical problems   Allergies  Allergen Reactions  . Latex Itching and Rash   Family History  Problem Relation Age of Onset  . Hypertension Mother   . Coronary artery disease Mother   . Heart disease Mother     CHF  . Cancer Neg Hx     breast or colon  . Diabetes Neg Hx     Past medical history, social, surgical and family history all reviewed in electronic medical record.  No pertanent information unless stated regarding to the chief complaint.   Review of Systems:Review of systems updated and as accurate as of 11/22/16  No headache, visual changes, nausea, vomiting, diarrhea, constipation, dizziness, abdominal pain, skin rash, fevers, chills, night sweats, weight loss, swollen lymph nodes chest pain, shortness of breath, mood changes.   Objective  There were no vitals taken for this visit. Systems examined below as of 11/22/16   General: No apparent distress alert and oriented x3 mood and affect normal, dressed appropriately.  HEENT: Pupils equal, extraocular movements intact  Respiratory: Patient's speak in  full sentences and does not appear short of breath  Cardiovascular: No lower extremity edema, non tender, no erythema  Skin: Warm dry intact with no signs of infection or rash on extremities or on axial skeleton.  Abdomen: Soft nontender  Neuro: Cranial nerves II through XII are intact, neurovascularly intact in all extremities with 2+ DTRs and 2+ pulses.  Lymph: No lymphadenopathy of posterior or anterior cervical chain or axillae bilaterally.  Gait normal with good balance and coordination.  MSK:  Non tender with full range of motion and good stability and symmetric strength and tone of shoulders, elbows,  hip, knee and ankles  bilaterally. Arthritic changes of multiple joints Wrist: Bilateral Patient does have atrophy of the thenar eminence bilaterally right greater than left. Positive grind test bilaterally. Crepitus noted with range of motion. 4 out of 5 strength but symmetric. Neurovascularly intact in all extremities.  MSK US performed of: Bilateral wrist  This study was ordered, performed, and interpreted by Charlann Boxer D.O.  Wrist: Arthritic changes of multiple joints noted. Patient does have severe bone-on-bone osteophytic changes of the CMC joints bilaterally  IMPRESSION: Bilateral CMC arthritis  Procedure: Real-time Ultrasound Guided Injection of right CMC joint Device: GE Logiq E  Ultrasound guided injection is preferred based studies that show increased duration, increased effect, greater accuracy, decreased procedural pain, increased response rate, and decreased cost with ultrasound guided versus blind injection.  Verbal informed consent obtained.  Time-out conducted.  Noted no overlying erythema, induration, or other signs of local infection.  Skin prepped in a sterile fashion.  Local anesthesia: Topical Ethyl chloride.  With sterile technique and under real time ultrasound guidance:  With a 25-gauge half-inch needle was injected with a total of 0.5 mL of 0.5% Marcaine and 0.5 mL of Kenalog 40 mg/dL Completed without difficulty  Pain immediately resolved suggesting accurate placement of the medication.  Advised to call if fevers/chills, erythema, induration, drainage, or persistent bleeding.  Images permanently stored and available for review in the ultrasound unit.  Impression: Technically successful ultrasound guided injection.  Procedure: Real-time Ultrasound Guided Injection of left CMC joint Device: GE Logiq E  Ultrasound guided injection is preferred based studies that show increased duration, increased effect, greater accuracy, decreased procedural pain, increased response rate, and  decreased cost with ultrasound guided versus blind injection.  Verbal informed consent obtained.  Time-out conducted.  Noted no overlying erythema, induration, or other signs of local infection.  Skin prepped in a sterile fashion.  Local anesthesia: Topical Ethyl chloride.  With sterile technique and under real time ultrasound guidance:  With a 25-gauge half-inch needle was injected with a total of 0.5 mL of 0.5% Marcaine and 0.5 mL of Kenalog 40 mg/dL Completed without difficulty  Pain immediately resolved suggesting accurate placement of the medication.  Advised to call if fevers/chills, erythema, induration, drainage, or persistent bleeding.  Images permanently stored and available for review in the ultrasound unit.  Impression: Technically successful ultrasound guided injection.   Impression and Recommendations:     This case required medical decision making of moderate complexity.      Note: This dictation was prepared with Dragon dictation along with smaller phrase technology. Any transcriptional errors that result from this process are unintentional.

## 2016-11-25 ENCOUNTER — Ambulatory Visit (INDEPENDENT_AMBULATORY_CARE_PROVIDER_SITE_OTHER): Payer: Medicare Other | Admitting: Family Medicine

## 2016-11-25 ENCOUNTER — Encounter: Payer: Self-pay | Admitting: Family Medicine

## 2016-11-25 ENCOUNTER — Ambulatory Visit: Payer: Self-pay

## 2016-11-25 VITALS — BP 142/66 | HR 80 | Ht 63.0 in | Wt 161.0 lb

## 2016-11-25 DIAGNOSIS — M19049 Primary osteoarthritis, unspecified hand: Secondary | ICD-10-CM

## 2016-11-25 DIAGNOSIS — M180 Bilateral primary osteoarthritis of first carpometacarpal joints: Secondary | ICD-10-CM

## 2016-11-25 NOTE — Assessment & Plan Note (Signed)
Bilateral injections given today. Topical anti-inflammatory trial given. We discussed icing. Discussed potential bracing which patient declined. Follow-up again in 4 weeks.

## 2016-11-25 NOTE — Patient Instructions (Signed)
Good to see you.  Injected both thumb joints today  Ice 20 minutes 2 times daily. Usually after activity and before bed. pennsaid pinkie amount topically 2 times daily as needed.  Try to avoid heavy lifting.  I hope this helps and can repeat it every 3 months See me again in 4 weeks.

## 2016-12-16 ENCOUNTER — Other Ambulatory Visit: Payer: Self-pay | Admitting: Family

## 2016-12-18 ENCOUNTER — Other Ambulatory Visit: Payer: Self-pay | Admitting: Family

## 2016-12-22 ENCOUNTER — Other Ambulatory Visit: Payer: Self-pay | Admitting: Family

## 2016-12-23 ENCOUNTER — Other Ambulatory Visit: Payer: Self-pay | Admitting: Internal Medicine

## 2016-12-25 ENCOUNTER — Ambulatory Visit (INDEPENDENT_AMBULATORY_CARE_PROVIDER_SITE_OTHER): Payer: Medicare Other | Admitting: Family Medicine

## 2016-12-25 ENCOUNTER — Encounter: Payer: Self-pay | Admitting: Family Medicine

## 2016-12-25 DIAGNOSIS — M180 Bilateral primary osteoarthritis of first carpometacarpal joints: Secondary | ICD-10-CM

## 2016-12-25 NOTE — Progress Notes (Signed)
[name] [place], [place] [address] Phone: [phone] Subjective:     CC: Pain in hands and wrists f/u   [id number]  Jamie [name] is a 83 y.o. female coming in with complaint of hand and wrist pain. Patient was found to have a [place] arthritis bilaterally. Patient was given injections and was to do conservative therapy. Patient states that she has had no pain at all. Patient is wondering if she regains some of her strength. Patient states some very mild spasm of the muscles when she is doing a lot of repetitive activity but very minimal. Overall patient continues to do well. Very happy with the results.     Past Medical History:  Diagnosis Date  . ANEMIA, IRON DEFICIENCY 09/10/2007  . CATARACT EXTRACTION, HX OF 09/10/2007  . DYSPEPSIA, HX OF 09/10/2007  . HAIR LOSS 03/21/2009  . HAY FEVER 09/10/2007  . HEARING LOSS, SENSORINEURAL 09/10/2007  . HYPERLIPIDEMIA 09/10/2007  . HYPERTENSION 09/10/2007  . LOW BACK PAIN 05/10/2008  . Pain in joint, hand 03/01/2008  . PEPTIC ULCER DISEASE 09/10/2007  . POLYP, COLON 09/10/2007  . POLYPECTOMY, HX OF 09/10/2007  . SHINGLES 08/13/2009  . SHOULDER PAIN, LEFT 02/12/2008  . TAH/BSO, HX OF 09/10/2007   Past Surgical History:  Procedure Laterality Date  . CATARACT EXTRACTION    . IRRIGATION AND DEBRIDEMENT SEBACEOUS CYST    . POLYPECTOMY    . TOTAL ABDOMINAL HYSTERECTOMY W/ BILATERAL SALPINGOOPHORECTOMY     Social History   Social History  . Marital status: Married    Spouse name: N/A  . Number of children: 4  . Years of education: N/A   Occupational History  . [employer], retire 1998 after 30 yrs    Social History Main Topics  . Smoking status: Former Smoker    Packs/day: 0.20    Years: 25.00    Types: Cigarettes    Quit date: 12/01/1989  . Smokeless tobacco: Never Used     Comment: smoked a long time ago; Quit 90 or 91/ number is approximate  .  Alcohol use No  . Drug use: Unknown  . Sexual activity: Not Asked   Other Topics Concern  . None   Social History Narrative   3 sons, 54,59, 65 and 1 daughter 69, 4 grandchildren   SO with multiple medical problems   Allergies  Allergen Reactions  . Latex Itching and Rash   Family History  Problem Relation Age of Onset  . Hypertension Mother   . Coronary artery disease Mother   . Heart disease Mother     CHF  . Cancer Neg Hx     breast or colon  . Diabetes Neg Hx     Past medical history, social, surgical and family history all reviewed in electronic medical record.  No pertanent information unless stated regarding to the chief complaint.   Review of Systems: No headache, visual changes, nausea, vomiting, diarrhea, constipation, dizziness, abdominal pain, skin rash, fevers, chills, night sweats, weight loss, swollen lymph nodes, body aches, joint swelling, chest pain, shortness of breath, mood changes.  Positive for muscle aches and cramping.  Objective  Blood pressure (!) 144/72, pulse 76, height 5\' 3"  (1.6 m), weight 160 lb (72.6 kg), SpO2 97 %.  Systems examined below as of 12/25/16 General: NAD A&O x3 mood, affect normal  HEENT: Pupils equal, extraocular movements intact no nystagmus Respiratory: not short of breath at rest or with  speaking Cardiovascular: No lower extremity edema, non tender Skin: Warm dry intact with no signs of infection or rash on extremities or on axial skeleton. Abdomen: Soft nontender, no masses Neuro: Cranial nerves  intact, neurovascularly intact in all extremities with 2+ DTRs and 2+ pulses. Lymph: No lymphadenopathy appreciated today  Gait normal with good balance and coordination.  MSK:  Non tender with full range of motion and good stability and symmetric strength and tone of shoulders, elbows,  hip, knee and ankles bilaterally. Arthritic changes of multiple joints Wrist: Bilateral Patient does have atrophy of the thenar eminence  bilaterally patient still has some discomfort with the grind has bilaterally. No significant increase in weakness from previous exam. Neurovascularly intact distally.      Note: This dictation was prepared with Dragon dictation along with smaller phrase technology. Any transcriptional errors that result from this process are unintentional.

## 2016-12-25 NOTE — Patient Instructions (Signed)
Good to see you Use the cream if needed A stress ball or squishy ball squeeze 5 seconds and then relax. Reapt 10 times 1-2 times a day  Rubber band around fingers as well can help and do same exercises Can repeat injections every 3 months if needed Otherwise see me when you need me.

## 2016-12-25 NOTE — Assessment & Plan Note (Signed)
Doing well after the injections. Discuss continuing conservative therapy. Given some strengthening exercise. Follow-up as needed.

## 2016-12-26 ENCOUNTER — Other Ambulatory Visit: Payer: Self-pay | Admitting: Family

## 2017-01-02 ENCOUNTER — Other Ambulatory Visit: Payer: Self-pay | Admitting: Family

## 2017-01-05 NOTE — Telephone Encounter (Signed)
Would need visit as I have never prescribed.

## 2017-01-08 ENCOUNTER — Encounter: Payer: Self-pay | Admitting: Internal Medicine

## 2017-01-08 ENCOUNTER — Ambulatory Visit (INDEPENDENT_AMBULATORY_CARE_PROVIDER_SITE_OTHER): Payer: Medicare Other | Admitting: Internal Medicine

## 2017-01-08 DIAGNOSIS — F419 Anxiety disorder, unspecified: Secondary | ICD-10-CM | POA: Diagnosis not present

## 2017-01-08 MED ORDER — ALPRAZOLAM 0.5 MG PO TABS: 0.5000 mg | ORAL_TABLET | Freq: Every day | ORAL | 0 refills | Status: DC | PRN

## 2017-01-08 NOTE — Progress Notes (Signed)
Pre visit review using our clinic review tool, if applicable. No additional management support is needed unless otherwise documented below in the visit note. 

## 2017-01-08 NOTE — Assessment & Plan Note (Signed)
She has had #90 pills for over 1 year and still about 1/2 the bottle left. She uses rarely and no side effects. Talked to her about increased risk of falls, memory loss, confusion with long term usage and she agrees not to use consistently. Refill for #20 no refills xanax 0.5 mg daily prn.

## 2017-01-08 NOTE — Progress Notes (Signed)
   Subjective:    Patient ID: Jamie [name], female    DOB: 03-29-1933, 83 y.o.   MRN: [id number]  HPI The patient is an 83 YO female coming in for anxiety. She had been given alprazolam about 1 year ago #90 and she has taken rarely since that time. She still has about half the bottle (she produced during the visit) but is concerned about them expiring. She only uses them rarely with increased stress. She denies any side effects from taking them. She denies confusion, dizziness, falls after taking. She does not drive after taking.   Review of Systems  Constitutional: Negative.   Respiratory: Negative for cough, chest tightness and shortness of breath.   Cardiovascular: Negative for chest pain, palpitations and leg swelling.  Gastrointestinal: Negative for abdominal distention, abdominal pain, constipation, diarrhea, nausea and vomiting.  Skin: Negative.   Psychiatric/Behavioral: Negative for behavioral problems, decreased concentration, dysphoric mood, hallucinations, self-injury, sleep disturbance and suicidal ideas. The patient is nervous/anxious.       Objective:   Physical Exam  Constitutional: She is oriented to person, place, and time. She appears well-developed and well-nourished.  HENT:  Head: Normocephalic and atraumatic.  Eyes: EOM are normal.  Neck: Normal range of motion.  Cardiovascular: Normal rate and regular rhythm.   Pulmonary/Chest: Effort normal and breath sounds normal.  Abdominal: Soft.  Neurological: She is alert and oriented to person, place, and time. Coordination normal.  Skin: Skin is warm and dry.  Psychiatric: She has a normal mood and affect.   Vitals:   01/08/17 1052 01/08/17 1110  BP: (!) 152/68 (!) 146/70  Pulse: 90   Temp: 98.2 F (36.8 C)   TempSrc: Oral   SpO2: 98%   Weight: 160 lb (72.6 kg)   Height: 5\' 3"  (1.6 m)       Assessment & Plan:

## 2017-01-08 NOTE — Patient Instructions (Addendum)
We have sent in the refills of the alprazolam.   It is okay to start taking claritin or zyrtec in March to prevent the coughing and allergies from coming.   Biotin is safe to take

## 2017-02-24 ENCOUNTER — Ambulatory Visit (INDEPENDENT_AMBULATORY_CARE_PROVIDER_SITE_OTHER): Payer: Medicare Other | Admitting: Internal Medicine

## 2017-02-24 ENCOUNTER — Encounter: Payer: Self-pay | Admitting: Internal Medicine

## 2017-02-24 DIAGNOSIS — H6121 Impacted cerumen, right ear: Secondary | ICD-10-CM | POA: Diagnosis not present

## 2017-02-24 DIAGNOSIS — H6120 Impacted cerumen, unspecified ear: Secondary | ICD-10-CM | POA: Insufficient documentation

## 2017-02-24 NOTE — Progress Notes (Signed)
Pre visit review using our clinic review tool, if applicable. No additional management support is needed unless otherwise documented below in the visit note.  Right ear cleaning

## 2017-02-24 NOTE — Patient Instructions (Signed)
We have cleaned out the right ear, if the hearing does not improve call the office and we can get you in for a formal hearing test.

## 2017-02-24 NOTE — Progress Notes (Signed)
   Subjective:    Patient ID: Jamie [name], female    DOB: 14-Feb-1933, 83 y.o.   MRN: [id number]  HPI The patient is an 83 YO female coming in for right ear hearing loss. She is having some hearing problems as well for the last several weeks. Started with itching in the ear and this is mostly gone. Denies pain in the ear. No sinus pressure or congestion. Some mild ringing in the ear as well. No injury to the area.   Review of Systems  Constitutional: Negative for activity change, appetite change, fatigue, fever and unexpected weight change.  HENT: Positive for hearing loss and tinnitus. Negative for congestion, ear discharge, ear pain, mouth sores, nosebleeds, postnasal drip, rhinorrhea, sinus pain, sinus pressure, trouble swallowing and voice change.   Eyes: Negative.   Respiratory: Negative.   Cardiovascular: Negative.   Gastrointestinal: Negative.       Objective:   Physical Exam  Constitutional: She appears well-developed and well-nourished.  HENT:  Head: Normocephalic and atraumatic.  Left Ear: External ear normal.  Nose: Nose normal.  Mouth/Throat: Oropharynx is clear and moist.  Right ear with wax impaction, TM normal after lavage  Eyes: EOM are normal.  Neck: Normal range of motion. No JVD present.  Cardiovascular: Normal rate and regular rhythm.   Pulmonary/Chest: Effort normal and breath sounds normal.  Abdominal: Soft.  Lymphadenopathy:    She has no cervical adenopathy.   Vitals:   02/24/17 0927  BP: 130/60  Pulse: 86  Resp: 12  Temp: 98.5 F (36.9 C)  TempSrc: Oral  SpO2: 99%  Weight: 163 lb (73.9 kg)  Height: 5\' 3"  (1.6 m)      Assessment & Plan:

## 2017-02-24 NOTE — Assessment & Plan Note (Signed)
Right hearing improved after lavage in the office and TM normal. If still having problems should get formal hearing test.

## 2017-03-14 ENCOUNTER — Encounter (INDEPENDENT_AMBULATORY_CARE_PROVIDER_SITE_OTHER): Payer: Self-pay

## 2017-03-14 ENCOUNTER — Encounter: Payer: Self-pay | Admitting: Family Medicine

## 2017-03-14 ENCOUNTER — Ambulatory Visit (INDEPENDENT_AMBULATORY_CARE_PROVIDER_SITE_OTHER): Payer: Medicare Other | Admitting: Family Medicine

## 2017-03-14 VITALS — BP 134/62 | HR 87 | Temp 98.5°F | Wt 162.0 lb

## 2017-03-14 DIAGNOSIS — M25562 Pain in left knee: Secondary | ICD-10-CM | POA: Diagnosis not present

## 2017-03-14 MED ORDER — DICLOFENAC SODIUM 1 % TD GEL: 4.0000 g | Freq: Four times a day (QID) | TRANSDERMAL | 0 refills | Status: DC

## 2017-03-14 NOTE — Progress Notes (Signed)
   Subjective:    Patient ID: Jamie [name], female    DOB: 1933-11-29, 83 y.o.   MRN: [id number]  HPI L knee pain- pt was standing at her dresser yesterday and when she turned, 'it didn't go right'.  Since then she has been having pain and barely able to bear weight on L leg.  No swelling.  Pain is mostly posterior but will radiate around the front of the leg.   Review of Systems For ROS see HPI     Objective:   Physical Exam  Constitutional: She is oriented to person, place, and time. She appears well-developed and well-nourished. No distress.  Cardiovascular: Intact distal pulses.   Musculoskeletal: She exhibits no edema, tenderness or deformity.  (-) SLR bilaterally No pain w/ L leg extension, + posterior pain w/ knee flexion No pain along lateral or medial joint line Pain w/ weight bearing  Neurological: She is alert and oriented to person, place, and time.  Skin: Skin is warm and dry. No rash noted. No erythema.  Psychiatric: She has a normal mood and affect. Her behavior is normal.  Vitals reviewed.         Assessment & Plan:  L knee pain- new.  Pt twisted yesterday and has had pain in L knee since.  No swelling or effusion present on exam.  + pain w/ L knee flexion and weight bearing.  Concern for meniscus injury.  Knee immobilizer given and pt to use ice, voltaren gel and tylenol for pain relief.  If no improvement in a week, will need ortho or sports med referral.  Reviewed supportive care and red flags that should prompt return.  Pt expressed understanding and is in agreement w/ plan.

## 2017-03-14 NOTE — Patient Instructions (Signed)
Follow up w/ Dr Sharlet Salina next week to see how you are doing Wear the knee brace while you are up and about Use the Voltaren gel up to 4x/day for pain You can add tylenol as needed for discomfort ICE! Call with any questions or concerns Hang in there!!!

## 2017-03-14 NOTE — Progress Notes (Signed)
Pre visit review using our clinic review tool, if applicable. No additional management support is needed unless otherwise documented below in the visit note. 

## 2017-03-25 ENCOUNTER — Encounter: Payer: Self-pay | Admitting: Internal Medicine

## 2017-03-25 ENCOUNTER — Ambulatory Visit (INDEPENDENT_AMBULATORY_CARE_PROVIDER_SITE_OTHER): Payer: Medicare Other | Admitting: Internal Medicine

## 2017-03-25 DIAGNOSIS — M25562 Pain in left knee: Secondary | ICD-10-CM

## 2017-03-25 MED ORDER — PREDNISONE 20 MG PO TABS: 40.0000 mg | ORAL_TABLET | Freq: Every day | ORAL | 0 refills | Status: DC

## 2017-03-25 NOTE — Patient Instructions (Signed)
We have sent in the prednisone to take. Take 2 pills daily for 5 days.   You can keep wearing the brace for another week and then likely you will not need it anymore.

## 2017-03-25 NOTE — Progress Notes (Signed)
Pre visit review using our clinic review tool, if applicable. No additional management support is needed unless otherwise documented below in the visit note. 

## 2017-03-26 DIAGNOSIS — M25562 Pain in left knee: Secondary | ICD-10-CM | POA: Insufficient documentation

## 2017-03-26 NOTE — Assessment & Plan Note (Addendum)
No signs of ACL or PCL tear on exam, no tenderness of the MCL or LCL on exam. Advised to use knee brace for another week and then try without. She can try cane at home if she is able. Use tylenol for pain as needed. Rx for prednisone for likely pre-patellar bursitis.

## 2017-03-26 NOTE — Progress Notes (Signed)
   Subjective:    Patient ID: Jamie [name], female    DOB: 1933/11/27, 83 y.o.   MRN: [id number]  HPI The patient is an 83 YO female coming in for follow up on twisted knee. She was given an immobilizer in case of tendon injury. She has been wearing this for about 1-2 weeks and having less pain. She denies feeling of instability in the leg. She denies swelling of the leg. She is using tylenol for pain but has not needed it in the last 24 hours. She is using walker at home for more stability. 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   Review of Systems  Constitutional: Positive for activity change. Negative for appetite change, fatigue, fever and unexpected weight change.  Respiratory: Negative.   Cardiovascular: Negative.   Gastrointestinal: Negative.   Musculoskeletal: Positive for arthralgias. Negative for back pain, gait problem, myalgias, neck pain and neck stiffness.  Skin: Negative.   Neurological: Negative.       Objective:   Physical Exam  Constitutional: She is oriented to person, place, and time. She appears well-developed and well-nourished.  HENT:  Head: Normocephalic and atraumatic.  Eyes: EOM are normal.  Cardiovascular: Normal rate and regular rhythm.   Pulmonary/Chest: Effort normal. No respiratory distress. She has no wheezes. She has no rales.  Abdominal: Soft. She exhibits no distension. There is no tenderness. There is no rebound.  Musculoskeletal: She exhibits tenderness.  Left knee ACL/PCL intact, pain over the pre-patellar bursa  Neurological: She is alert and oriented to person, place, and time. Coordination normal.  Skin: Skin is warm and dry.   Vitals:   03/25/17 1109  BP: 138/62  Pulse: 79  Resp: 14  Temp: 98.3 F (36.8 C)  TempSrc: Oral  SpO2: 99%  Weight: 162 lb (73.5 kg)  Height: 5\' 3"  (1.6 m)      Assessment & Plan:

## 2017-04-21 ENCOUNTER — Ambulatory Visit (INDEPENDENT_AMBULATORY_CARE_PROVIDER_SITE_OTHER): Payer: Medicare Other | Admitting: Internal Medicine

## 2017-04-21 ENCOUNTER — Encounter: Payer: Self-pay | Admitting: Internal Medicine

## 2017-04-21 ENCOUNTER — Ambulatory Visit (INDEPENDENT_AMBULATORY_CARE_PROVIDER_SITE_OTHER)
Admission: RE | Admit: 2017-04-21 | Discharge: 2017-04-21 | Disposition: A | Discharge status: Home / Self Care | Payer: Medicare Other | Source: Ambulatory Visit | Attending: Internal Medicine | Admitting: Internal Medicine

## 2017-04-21 VITALS — BP 130/62 | HR 80 | Temp 98.3°F | Resp 12 | Ht 63.0 in | Wt 163.0 lb

## 2017-04-21 DIAGNOSIS — M25562 Pain in left knee: Secondary | ICD-10-CM | POA: Diagnosis not present

## 2017-04-21 NOTE — Progress Notes (Signed)
   Subjective:    Patient ID: Jamie [name], female    DOB: [birth year], 83 y.o.   MRN: [id number]  HPI The patient is an 83 YO female coming in for left knee swelling. She had a twist injury about 1 month ago or so. She initially was having pain in the back of the left knee. She wore a brace for about 1-2 weeks and is now walking normally without brace and without cane anymore. She is not having pain most of the time. With a lot of walking she gets a mild pain in her knee. She is able to take tylenol and this works well. Denies new fall or twist injury.   Review of Systems  Constitutional: Negative for activity change, appetite change, fatigue, fever and unexpected weight change.  Respiratory: Negative.   Cardiovascular: Negative.   Gastrointestinal: Negative.   Musculoskeletal: Positive for joint swelling. Negative for arthralgias, gait problem, myalgias, neck pain and neck stiffness.  Skin: Negative.   Neurological: Negative.       Objective:   Physical Exam  Constitutional: She is oriented to person, place, and time. She appears well-developed and well-nourished.  HENT:  Head: Normocephalic and atraumatic.  Eyes: EOM are normal.  Cardiovascular: Normal rate and regular rhythm.   Pulmonary/Chest: Effort normal.  Abdominal: Soft.  Musculoskeletal: She exhibits no edema or tenderness.  No knee effusion but some soft tissue swelling around the medial knee  Neurological: She is alert and oriented to person, place, and time. Coordination normal.  Skin: Skin is warm and dry.   Vitals:   04/21/17 1113  BP: 130/62  Pulse: 80  Resp: 12  Temp: 98.3 F (36.8 C)  TempSrc: Oral  SpO2: 99%  Weight: 163 lb (73.9 kg)  Height: 5\' 3"  (1.6 m)      Assessment & Plan:

## 2017-04-21 NOTE — Patient Instructions (Addendum)
We will check the x-ray of the knee today to check on the swelling.   It is likely normal still leftover from the twist to the knee.   It is okay to use the pain medicine if needed and prop the leg up in the evenings when you can.  The pat

## 2017-04-21 NOTE — Assessment & Plan Note (Signed)
Ordered left x-ray knee for persistent swelling. Overall she is improved and no recurrent injury.

## 2017-04-29 ENCOUNTER — Ambulatory Visit: Payer: Medicare Other | Admitting: Family Medicine

## 2017-04-30 NOTE — Progress Notes (Signed)
[name] [place], [place] [address] Phone: [phone] Subjective:    I'm seeing this patient by the request  of:    CC: Left knee pain  [id number]  Jamie [name] is a 83 y.o. female coming in with complaint of patient's knee pain is been going on for 6 weeks. Started initially after twisting it. Patient states that hit it against the dresser when twisted didn't feel right. Barely was able to bear weight. Pain seemed to be mostly on the posterior of the leg. Patient was having difficulty walking and side provider who did put her in a knee immobilizer as well as ice, topical anti-inflammatories and Tylenol. Patient was found by another provider then to have a fairly stable knee to continue the knee immobilizer. Patient was given prednisone for what seemed a more of a prepatellar bursitis at that time.  Patient one month later was continuing to have difficulty. Patient saw her primary care provider again and did have x-rays at that time. Pictures were and apparently visualized by me showing medial compartment degenerative changes but otherwise fairly unremarkable.  continues to have pain. Some instability. Feels like she still has swelling. The severity pain is 7 out of 10. Affecting daily activities and can wake her up at night.  Past Medical History:  Diagnosis Date  . ANEMIA, IRON DEFICIENCY 09/10/2007  . CATARACT EXTRACTION, HX OF 09/10/2007  . DYSPEPSIA, HX OF 09/10/2007  . HAIR LOSS 03/21/2009  . HAY FEVER 09/10/2007  . HEARING LOSS, SENSORINEURAL 09/10/2007  . HYPERLIPIDEMIA 09/10/2007  . HYPERTENSION 09/10/2007  . LOW BACK PAIN 05/10/2008  . Pain in joint, hand 03/01/2008  . PEPTIC ULCER DISEASE 09/10/2007  . POLYP, COLON 09/10/2007  . POLYPECTOMY, HX OF 09/10/2007  . SHINGLES 08/13/2009  . SHOULDER PAIN, LEFT 02/12/2008  . TAH/BSO, HX OF 09/10/2007   Past Surgical History:  Procedure Laterality Date  . CATARACT EXTRACTION      . IRRIGATION AND DEBRIDEMENT SEBACEOUS CYST    . POLYPECTOMY    . TOTAL ABDOMINAL HYSTERECTOMY W/ BILATERAL SALPINGOOPHORECTOMY     Social History   Social History  . Marital status: Married    Spouse name: N/A  . Number of children: 4  . Years of education: N/A   Occupational History  . [employer], retire 1998 after 30 yrs    Social History Main Topics  . Smoking status: Former Smoker    Packs/day: 0.20    Years: 25.00    Types: Cigarettes    Quit date: 12/01/1989  . Smokeless tobacco: Never Used     Comment: smoked a long time ago; Quit 90 or 91/ number is approximate  . Alcohol use No  . Drug use: Unknown  . Sexual activity: Not on file   Other Topics Concern  . Not on file   Social History Narrative   3 sons, 54,59, 88 and 1 daughter 55, 4 grandchildren   SO with multiple medical problems   Allergies  Allergen Reactions  . Latex Itching and Rash   Family History  Problem Relation Age of Onset  . Hypertension Mother   . Coronary artery disease Mother   . Heart disease Mother        CHF  . Cancer Neg Hx        breast or colon  . Diabetes Neg Hx     Past medical history, social, surgical and family history all reviewed in  electronic medical record.  No pertanent information unless stated regarding to the chief complaint.   Review of Systems:Review of systems updated and as accurate as of 04/30/17  No headache, visual changes, nausea, vomiting, diarrhea, constipation, dizziness, abdominal pain, skin rash, fevers, chills, night sweats, weight loss, swollen lymph nodes, body aches,chest pain, shortness of breath, mood changes. Positive joint swelling, muscle aches  Objective  There were no vitals taken for this visit. Systems examined below as of 04/30/17   General: No apparent distress alert and oriented x3 mood and affect normal, dressed appropriately.  HEENT: Pupils equal, extraocular movements intact  Respiratory: Patient's  speak in full sentences and does not appear short of breath  Cardiovascular: No lower extremity edema, non tender, no erythema  Skin: Warm dry intact with no signs of infection or rash on extremities or on axial skeleton.  Abdomen: Soft nontender  Neuro: Cranial nerves II through XII are intact, neurovascularly intact in all extremities with 2+ DTRs and 2+ pulses.  Lymph: No lymphadenopathy of posterior or anterior cervical chain or axillae bilaterally.  Gait Antalgic gait MSK:  Non tender with full range of motion and good stability and symmetric strength and tone of shoulders, elbows, wrist, hip, knee and ankles bilaterally. Moderate arthritic changes of multiple joints Knee: Left Effusion noted Diffuse tenderness Patient is lacking the last 20 of flexion Ligaments with solid consistent endpoints including ACL, PCL, LCL, MCL. Negative Mcmurray's, Apley's, and Thessalonian tests. painful patellar compression. Patellar glide with moderate crepitus. Patellar and quadriceps tendons unremarkable. Hamstring and quadriceps strength is normal.   MSK US performed of: Left knee This study was ordered, performed, and interpreted by Charlann Boxer D.O.  Knee: Effusion noted. Patient does have narrowing of the patellofemoral as well as the medial joint space. Possible degenerative tear of the meniscus noted.  IMPRESSION:  Acute on chronic meniscal injury with underlying arthritis and effusion  Procedure: Real-time Ultrasound Guided Injection of left knee Device: GE Logiq Q7 Ultrasound guided injection is preferred based studies that show increased duration, increased effect, greater accuracy, decreased procedural pain, increased response rate, and decreased cost with ultrasound guided versus blind injection.  Verbal informed consent obtained.  Time-out conducted.  Noted no overlying erythema, induration, or other signs of local infection.  Skin prepped in a sterile fashion.  Local anesthesia:  Topical Ethyl chloride.  With sterile technique and under real time ultrasound guidance: With a 22-gauge 2 inch needle patient was injected with 4 cc of 0.5% Marcaine and aspirated 35 mL of strawlike fluid then injected 1 cc of Kenalog 40 mg/dL. This was from a superior lateral approach.  Completed without difficulty  Advised to call if fevers/chills, erythema, induration, drainage, or persistent bleeding.  Images permanently stored and available for review in the ultrasound unit.  Impression: Technically successful ultrasound guided injection.    Impression and Recommendations:     This case required medical decision making of moderate complexity.      Note: This dictation was prepared with Dragon dictation along with smaller phrase technology. Any transcriptional errors that result from this process are unintentional.       '

## 2017-05-01 ENCOUNTER — Ambulatory Visit (INDEPENDENT_AMBULATORY_CARE_PROVIDER_SITE_OTHER): Payer: Medicare Other | Admitting: Family Medicine

## 2017-05-01 ENCOUNTER — Ambulatory Visit: Payer: Self-pay

## 2017-05-01 ENCOUNTER — Encounter: Payer: Self-pay | Admitting: Family Medicine

## 2017-05-01 VITALS — BP 128/72 | HR 86 | Ht 63.0 in | Wt 165.0 lb

## 2017-05-01 DIAGNOSIS — M25462 Effusion, left knee: Secondary | ICD-10-CM | POA: Diagnosis not present

## 2017-05-01 DIAGNOSIS — M25562 Pain in left knee: Secondary | ICD-10-CM

## 2017-05-01 DIAGNOSIS — M23204 Derangement of unspecified medial meniscus due to old tear or injury, left knee: Secondary | ICD-10-CM | POA: Diagnosis not present

## 2017-05-01 DIAGNOSIS — M25569 Pain in unspecified knee: Secondary | ICD-10-CM

## 2017-05-01 NOTE — Patient Instructions (Addendum)
Good to see you  Ice is your friend  Ice 20 minutes 2 times daily. Usually after activity and before bed. Wear brace daily for next 2-3 weeks.  Do not wear the brace at night.  pennsaid pinkie amount topically 2 times daily as needed.  Avoid twisting motion  See me again in 3-4 weeks.

## 2017-05-01 NOTE — Assessment & Plan Note (Signed)
Patient had an injection today. Has good aspiration. Discussed icing regimen and home exercises. Discussed objective is a doing which ones to avoid. Patient is going to increase activity as tolerated. Patient will come back and see me again in 3-4 weeks

## 2017-05-09 ENCOUNTER — Other Ambulatory Visit: Payer: Self-pay | Admitting: Family

## 2017-05-09 ENCOUNTER — Other Ambulatory Visit: Payer: Self-pay | Admitting: Internal Medicine

## 2017-05-14 ENCOUNTER — Other Ambulatory Visit: Payer: Self-pay | Admitting: Internal Medicine

## 2017-05-15 ENCOUNTER — Other Ambulatory Visit: Payer: Self-pay | Admitting: Internal Medicine

## 2017-05-26 ENCOUNTER — Ambulatory Visit (INDEPENDENT_AMBULATORY_CARE_PROVIDER_SITE_OTHER): Payer: Medicare Other | Admitting: Family Medicine

## 2017-05-26 ENCOUNTER — Encounter: Payer: Self-pay | Admitting: Family Medicine

## 2017-05-26 DIAGNOSIS — M1712 Unilateral primary osteoarthritis, left knee: Secondary | ICD-10-CM | POA: Insufficient documentation

## 2017-05-26 NOTE — Patient Instructions (Addendum)
Good to see you  Overall I am impressed.  Keep doing what you are doing  If it gets worse can repeat injection in 2 months  Otherwise have other injections as well.  See me when you need me.

## 2017-05-26 NOTE — Assessment & Plan Note (Signed)
Degenerative arthritis noted. Patient will continue with conservative therapy. Tolerating conservative therapy after the injection. Follow-up with me again in 2 months.

## 2017-05-26 NOTE — Progress Notes (Signed)
[name] [place],  [address] Phone: [phone] Subjective:     CC: Left knee pain f/u  [id number]  Jamie [name] is a 83 y.o. female coming in with complaint of patient's knee pain. Right-sided. Sometimes significant arthritis in the knee. Was given an injection 4 weeks ago. Feeling significantly better. 90% better. Able to walk long distances. No giving out on her. Feeling like she is doing relatively well.  Past Medical History:  Diagnosis Date  . ANEMIA, IRON DEFICIENCY 09/10/2007  . CATARACT EXTRACTION, HX OF 09/10/2007  . DYSPEPSIA, HX OF 09/10/2007  . HAIR LOSS 03/21/2009  . HAY FEVER 09/10/2007  . HEARING LOSS, SENSORINEURAL 09/10/2007  . HYPERLIPIDEMIA 09/10/2007  . HYPERTENSION 09/10/2007  . LOW BACK PAIN 05/10/2008  . Pain in joint, hand 03/01/2008  . PEPTIC ULCER DISEASE 09/10/2007  . POLYP, COLON 09/10/2007  . POLYPECTOMY, HX OF 09/10/2007  . SHINGLES 08/13/2009  . SHOULDER PAIN, LEFT 02/12/2008  . TAH/BSO, HX OF 09/10/2007   Past Surgical History:  Procedure Laterality Date  . CATARACT EXTRACTION    . IRRIGATION AND DEBRIDEMENT SEBACEOUS CYST    . POLYPECTOMY    . TOTAL ABDOMINAL HYSTERECTOMY W/ BILATERAL SALPINGOOPHORECTOMY     Social History   Social History  . Marital status: Married    Spouse name: N/A  . Number of children: 4  . Years of education: N/A   Occupational History  . [employer], retire 1998 after 30 yrs    Social History Main Topics  . Smoking status: Former Smoker    Packs/day: 0.20    Years: 25.00    Types: Cigarettes    Quit date: 12/01/1989  . Smokeless tobacco: Never Used     Comment: smoked a long time ago; Quit 90 or 91/ number is approximate  . Alcohol use No  . Drug use: Unknown  . Sexual activity: Not Asked   Other Topics Concern  . None   Social History Narrative   3 sons, 54,59, 48 and 1 daughter 55, 4 grandchildren   SO with  multiple medical problems   Allergies  Allergen Reactions  . Latex Itching and Rash   Family History  Problem Relation Age of Onset  . Hypertension Mother   . Coronary artery disease Mother   . Heart disease Mother        CHF  . Cancer Neg Hx        breast or colon  . Diabetes Neg Hx     Past medical history, social, surgical and family history all reviewed in electronic medical record.  No pertanent information unless stated regarding to the chief complaint.   Review of Systems: No headache, visual changes, nausea, vomiting, diarrhea, constipation, dizziness, abdominal pain, skin rash, fevers, chills, night sweats, weight loss, swollen lymph nodes, body aches, joint swelling, muscle aches, chest pain, shortness of breath, mood changes.    Objective  Blood pressure 128/68, pulse 79, height 5\' 3"  (1.6 m), weight 163 lb (73.9 kg), SpO2 97 %.   Systems examined below as of 05/26/17 General: NAD A&O x3 mood, affect normal  HEENT: Pupils equal, extraocular movements intact no nystagmus Respiratory: not short of breath at rest or with speaking Cardiovascular: No lower extremity edema, non tender Skin: Warm dry intact with no signs of infection or rash on extremities or on axial skeleton. Abdomen: Soft nontender, no masses Neuro: Cranial nerves  intact, neurovascularly intact in all  extremities with 2+ DTRs and 2+ pulses. Lymph: No lymphadenopathy appreciated today  Gait normal with good balance and coordination.  MSK: Non tender with full range of motion and good stability and symmetric strength and tone of shoulders, elbows, wrist,  hips and ankles bilaterally.Arthritic changes of multiple joints   . Knee: Left Moderate arthritic changes noted valgus deformity Palpation normal with no warmth, joint line tenderness, patellar tenderness, or condyle tenderness. ROM full in flexion and extension and lower leg rotation. Ligaments with solid consistent endpoints including ACL, PCL, LCL,  MCL. Negative Mcmurray's, Apley's, and Thessalonian tests. Mild painful patellar compression. Patellar glide with mild crepitus. Patellar and quadriceps tendons unremarkable. Hamstring and quadriceps strength is normal.  Contralateral knee unremarkable     Impression and Recommendations:     This case required medical decision making of moderate complexity.      Note: This dictation was prepared with Dragon dictation along with smaller phrase technology. Any transcriptional errors that result from this process are unintentional.       '

## 2017-06-11 ENCOUNTER — Other Ambulatory Visit: Payer: Self-pay | Admitting: Family

## 2017-06-18 ENCOUNTER — Other Ambulatory Visit: Payer: Self-pay | Admitting: Family

## 2017-06-19 ENCOUNTER — Other Ambulatory Visit: Payer: Self-pay | Admitting: Internal Medicine

## 2017-07-16 ENCOUNTER — Emergency Department (HOSPITAL_COMMUNITY): Payer: Medicare Other

## 2017-07-16 ENCOUNTER — Encounter (HOSPITAL_COMMUNITY): Payer: Self-pay | Admitting: Emergency Medicine

## 2017-07-16 ENCOUNTER — Ambulatory Visit: Payer: Medicare Other | Admitting: Family Medicine

## 2017-07-16 ENCOUNTER — Emergency Department (HOSPITAL_COMMUNITY)
Admission: EM | Admit: 2017-07-16 | Discharge: 2017-07-16 | Disposition: A | Discharge status: Home / Self Care | Payer: Medicare Other | Attending: Emergency Medicine | Admitting: Emergency Medicine

## 2017-07-16 DIAGNOSIS — B9789 Other viral agents as the cause of diseases classified elsewhere: Secondary | ICD-10-CM | POA: Diagnosis not present

## 2017-07-16 DIAGNOSIS — J069 Acute upper respiratory infection, unspecified: Secondary | ICD-10-CM | POA: Insufficient documentation

## 2017-07-16 DIAGNOSIS — Z91040 Latex allergy status: Secondary | ICD-10-CM | POA: Insufficient documentation

## 2017-07-16 DIAGNOSIS — Z79899 Other long term (current) drug therapy: Secondary | ICD-10-CM | POA: Insufficient documentation

## 2017-07-16 DIAGNOSIS — I10 Essential (primary) hypertension: Secondary | ICD-10-CM | POA: Insufficient documentation

## 2017-07-16 DIAGNOSIS — J40 Bronchitis, not specified as acute or chronic: Secondary | ICD-10-CM | POA: Insufficient documentation

## 2017-07-16 DIAGNOSIS — Z87891 Personal history of nicotine dependence: Secondary | ICD-10-CM | POA: Diagnosis not present

## 2017-07-16 DIAGNOSIS — R05 Cough: Secondary | ICD-10-CM | POA: Diagnosis present

## 2017-07-16 LAB — CBC WITH DIFFERENTIAL/PLATELET
BASOS ABS: 0 10*3/uL (ref 0.0–0.1)
BASOS PCT: 0 %
EOS PCT: 0 %
Eosinophils Absolute: 0 10*3/uL (ref 0.0–0.7)
HCT: 38 % (ref 36.0–46.0)
Hemoglobin: 12.5 g/dL (ref 12.0–15.0)
Lymphocytes Relative: 12 %
Lymphs Abs: 0.8 10*3/uL (ref 0.7–4.0)
MCH: 31.5 pg (ref 26.0–34.0)
MCHC: 32.9 g/dL (ref 30.0–36.0)
MCV: 95.7 fL (ref 78.0–100.0)
MONO ABS: 0.3 10*3/uL (ref 0.1–1.0)
MONOS PCT: 4 %
Neutro Abs: 5.9 10*3/uL (ref 1.7–7.7)
Neutrophils Relative %: 84 %
PLATELETS: 173 10*3/uL (ref 150–400)
RBC: 3.97 MIL/uL (ref 3.87–5.11)
RDW: 13.3 % (ref 11.5–15.5)
WBC: 7.1 10*3/uL (ref 4.0–10.5)

## 2017-07-16 LAB — URINALYSIS, ROUTINE W REFLEX MICROSCOPIC
BACTERIA UA: NONE SEEN
BILIRUBIN URINE: NEGATIVE
Glucose, UA: NEGATIVE mg/dL
Ketones, ur: NEGATIVE mg/dL
Leukocytes, UA: NEGATIVE
NITRITE: NEGATIVE
PH: 7 (ref 5.0–8.0)
Protein, ur: NEGATIVE mg/dL
SPECIFIC GRAVITY, URINE: 1.006 (ref 1.005–1.030)
Squamous Epithelial / LPF: NONE SEEN

## 2017-07-16 LAB — COMPREHENSIVE METABOLIC PANEL
ALBUMIN: 3.9 g/dL (ref 3.5–5.0)
ALT: 14 U/L (ref 14–54)
ANION GAP: 9 (ref 5–15)
AST: 20 U/L (ref 15–41)
Alkaline Phosphatase: 155 U/L — ABNORMAL HIGH (ref 38–126)
BUN: 9 mg/dL (ref 6–20)
CO2: 23 mmol/L (ref 22–32)
Calcium: 8.7 mg/dL — ABNORMAL LOW (ref 8.9–10.3)
Chloride: 108 mmol/L (ref 101–111)
Creatinine, Ser: 0.72 mg/dL (ref 0.44–1.00)
GFR calc non Af Amer: >60 mL/min (ref >60)
Glucose, Bld: 104 mg/dL — ABNORMAL HIGH (ref 65–99)
POTASSIUM: 3.5 mmol/L (ref 3.5–5.1)
SODIUM: 140 mmol/L (ref 135–145)
TOTAL PROTEIN: 6.8 g/dL (ref 6.5–8.1)
Total Bilirubin: 0.9 mg/dL (ref 0.3–1.2)

## 2017-07-16 LAB — I-STAT TROPONIN, ED: TROPONIN I, POC: 0 ng/mL (ref 0.00–0.08)

## 2017-07-16 LAB — LIPASE, BLOOD: Lipase: 29 U/L (ref 11–51)

## 2017-07-16 MED ORDER — ONDANSETRON 4 MG PO TBDP: 4.0000 mg | ORAL_TABLET | Freq: Three times a day (TID) | ORAL | 0 refills | Status: DC | PRN

## 2017-07-16 MED ORDER — ONDANSETRON HCL 4 MG/2ML IJ SOLN
4.0000 mg | Freq: Once | INTRAMUSCULAR | Status: AC
  Administered 2017-07-16: 4 mg via INTRAVENOUS
  Filled 2017-07-16: qty 2

## 2017-07-16 MED ORDER — ALBUTEROL SULFATE HFA 108 (90 BASE) MCG/ACT IN AERS: 2.0000 | INHALATION_SPRAY | RESPIRATORY_TRACT | 0 refills | Status: DC | PRN

## 2017-07-16 MED ORDER — IPRATROPIUM-ALBUTEROL 0.5-2.5 (3) MG/3ML IN SOLN
3.0000 mL | Freq: Once | RESPIRATORY_TRACT | Status: AC
  Administered 2017-07-16: 3 mL via RESPIRATORY_TRACT
  Filled 2017-07-16: qty 3

## 2017-07-16 MED ORDER — METHYLPREDNISOLONE SODIUM SUCC 125 MG IJ SOLR
125.0000 mg | Freq: Once | INTRAMUSCULAR | Status: AC
  Administered 2017-07-16: 125 mg via INTRAVENOUS
  Filled 2017-07-16: qty 2

## 2017-07-16 MED ORDER — AEROCHAMBER PLUS FLO-VU MEDIUM MISC
1.0000 | Freq: Once | Status: AC
  Administered 2017-07-16: 1
  Filled 2017-07-16: qty 1

## 2017-07-16 MED ORDER — PREDNISONE 20 MG PO TABS: 40.0000 mg | ORAL_TABLET | Freq: Every day | ORAL | 0 refills | Status: DC

## 2017-07-16 NOTE — ED Triage Notes (Signed)
Per [facility] patient coming from home where she lives with family. Family reports patient is generally weak and dizzy and believes she may have became "overheated" due to a room being without AC in the home. Patient in [facility] room when EMS arrived.  Patient only reports nausea.

## 2017-07-16 NOTE — ED Provider Notes (Signed)
[facility] DEPT Provider Note   CSN: [id number] Arrival date & time: 07/16/17  1137     History   Chief Complaint Chief Complaint  Patient presents with  . Shortness of Breath  . Nausea    HPI KORISSA HORSFORD is a 83 y.o. female.  83yo F w/ PMH including HTN, HLD, GERD who p/w weakness and cough. Pt states this morning she began feeling generally weak and dizzy. She describes dizziness as feeling off when she stood up. She endorses associated malaise and nausea but no vomiting. She had upper abdominal pain this morning that is currently improved. She had mild non-bloody diarrhea this morning. She reports 3-4 weeks of cough that is usually dry with occasional clear mucous production, wheezing, and shortness of breath with exertion. She notes in the past she was diagnosed with bronchitis. She does not smoke currently but did smoke previously. No chest pain, headache, sick contacts, fevers, recent travel, or urinary symptoms. No history of blood clots or history of cancer. No recent weight gain or leg swelling/pain.   The history is provided by the patient.  Shortness of Breath     Past Medical History:  Diagnosis Date  . ANEMIA, IRON DEFICIENCY 09/10/2007  . CATARACT EXTRACTION, HX OF 09/10/2007  . DYSPEPSIA, HX OF 09/10/2007  . HAIR LOSS 03/21/2009  . HAY FEVER 09/10/2007  . HEARING LOSS, SENSORINEURAL 09/10/2007  . HYPERLIPIDEMIA 09/10/2007  . HYPERTENSION 09/10/2007  . LOW BACK PAIN 05/10/2008  . Pain in joint, hand 03/01/2008  . PEPTIC ULCER DISEASE 09/10/2007  . POLYP, COLON 09/10/2007  . POLYPECTOMY, HX OF 09/10/2007  . SHINGLES 08/13/2009  . SHOULDER PAIN, LEFT 02/12/2008  . TAH/BSO, HX OF 09/10/2007    Patient Active Problem List   Diagnosis Date Noted  . Degenerative arthritis of left knee 05/26/2017  . Degenerative tear of left medial meniscus 05/01/2017  . Effusion of left knee 05/01/2017  . Left knee pain 03/26/2017  . Hearing loss due to cerumen  impaction 02/24/2017  . Anxiety 01/08/2017  . Left wrist pain 11/05/2016  . Tenosynovitis of thumb 11/05/2016  . Obesity 06/17/2016  . Cough 01/24/2016  . Arthritis of carpometacarpal (CMC) joints of both thumbs 06/22/2014  . Routine health maintenance 08/09/2011  . HAIR LOSS 03/21/2009  . Hyperlipidemia 09/10/2007  . Essential hypertension 09/10/2007  . GERD (gastroesophageal reflux disease) 09/10/2007    Past Surgical History:  Procedure Laterality Date  . CATARACT EXTRACTION    . IRRIGATION AND DEBRIDEMENT SEBACEOUS CYST    . POLYPECTOMY    . TOTAL ABDOMINAL HYSTERECTOMY W/ BILATERAL SALPINGOOPHORECTOMY      OB History    No data available       Home Medications    Prior to Admission medications   Medication Sig Start Date End Date Taking? Authorizing Provider  alum & mag hydroxide-simeth (MYLANTA) 200-200-20 MG/5ML suspension Take 15 mLs by mouth every 6 (six) hours as needed for indigestion or heartburn.   Yes [provider]  amLODipine (NORVASC) 5 MG tablet TAKE 1 TABLET BY MOUTH DAILY Patient taking differently: TAKE 5mg  BY MOUTH DAILY 05/11/17  Yes Golden Circle, FNP  atorvastatin (LIPITOR) 20 MG tablet Take 1 tablet (20 mg total) by mouth daily. Annual appt due in July must see MD for refills 05/11/17  Yes Hoyt Koch, MD  furosemide (LASIX) 40 MG tablet TAKE 1 TABLET BY MOUTH EVERY DAY Patient taking differently: TAKE 40mg  BY MOUTH EVERY DAY 06/19/17  Yes Mauricio Po  D, FNP  ranitidine (ZANTAC) 150 MG tablet TAKE 1 TABLET BY MOUTH TWICE A DAY Patient taking differently: TAKE 150mg  BY MOUTH once daily 12/23/16  Yes Hoyt Koch, MD  valsartan (DIOVAN) 320 MG tablet TAKE 1 TABLET BY MOUTH DAILY Patient taking differently: TAKE 320mg  BY MOUTH once DAILY 12/26/16  Yes Golden Circle, FNP  albuterol (PROVENTIL HFA;VENTOLIN HFA) 108 (90 Base) MCG/ACT inhaler Inhale 2 puffs into the lungs every 4 (four) hours as needed for wheezing or  shortness of breath. 07/16/17   Little, Wenda Overland, MD  ALPRAZolam Duanne Moron) 0.5 MG tablet Take 1 tablet (0.5 mg total) by mouth daily as needed for anxiety. 01/08/17   Hoyt Koch, MD  clotrimazole-betamethasone (LOTRISONE) cream APPLY 1 APPLICATION TOPICALLY 2 (TWO) TIMES DAILY AS NEEDED (RASH). 12/19/16   Golden Circle, FNP  diclofenac sodium (VOLTAREN) 1 % GEL Apply 4 g topically 4 (four) times daily. 03/14/17   Midge Minium, MD  guaiFENesin (MUCINEX) 600 MG 12 hr tablet Take 1 tablet (600 mg total) by mouth 2 (two) times daily as needed for cough or to loosen phlegm. Patient not taking: Reported on 07/16/2017 08/07/16   Nche, Charlene Brooke, NP  ondansetron (ZOFRAN ODT) 4 MG disintegrating tablet Take 1 tablet (4 mg total) by mouth every 8 (eight) hours as needed for nausea or vomiting. 07/16/17   Little, Wenda Overland, MD  predniSONE (DELTASONE) 20 MG tablet Take 2 tablets (40 mg total) by mouth daily. 07/16/17   Little, Wenda Overland, MD    Family History Family History  Problem Relation Age of Onset  . Hypertension Mother   . Coronary artery disease Mother   . Heart disease Mother        CHF  . Cancer Neg Hx        breast or colon  . Diabetes Neg Hx     Social History Social History  Substance Use Topics  . Smoking status: Former Smoker    Packs/day: 0.20    Years: 25.00    Types: Cigarettes    Quit date: 12/01/1989  . Smokeless tobacco: Never Used     Comment: smoked a long time ago; Quit 90 or 91/ number is approximate  . Alcohol use No     Allergies   Latex   Review of Systems Review of Systems  Respiratory: Positive for shortness of breath.    All other systems reviewed and are negative except that which was mentioned in HPI  Physical Exam Updated Vital Signs BP 115/60   Pulse 81   Temp 98.6 F (37 C) (Oral)   Resp 19   Ht 5\' 3"  (1.6 m)   Wt 73.9 kg (163 lb)   SpO2 95%   BMI 28.87 kg/m   Physical Exam  Constitutional: She is oriented to  person, place, and time. She appears well-developed and well-nourished. No distress.  HENT:  Head: Normocephalic and atraumatic.  Mouth/Throat: Oropharynx is clear and moist.  Moist mucous membranes  Eyes: Pupils are equal, round, and reactive to light. Conjunctivae are normal.  Neck: Neck supple.  Cardiovascular: Normal rate, regular rhythm and normal heart sounds.   No murmur heard. Pulmonary/Chest: Effort normal. No respiratory distress. She has wheezes (inspiratory and expiratory).  Mildly diminished b/l  Abdominal: Soft. Bowel sounds are normal. She exhibits no distension. There is no tenderness.  Musculoskeletal: She exhibits no edema.  Neurological: She is alert and oriented to person, place, and time.  Fluent speech  Skin:  Skin is warm and dry.  Psychiatric: She has a normal mood and affect. Judgment normal.  Nursing note and vitals reviewed.    ED Treatments / Results  Labs (all labs ordered are listed, but only abnormal results are displayed) Labs Reviewed  COMPREHENSIVE METABOLIC PANEL - Abnormal; Notable for the following:       Result Value   Glucose, Bld 104 (*)    Calcium 8.7 (*)    Alkaline Phosphatase 155 (*)    All other components within normal limits  URINALYSIS, ROUTINE W REFLEX MICROSCOPIC - Abnormal; Notable for the following:    Color, Urine STRAW (*)    Hgb urine dipstick SMALL (*)    All other components within normal limits  CBC WITH DIFFERENTIAL/PLATELET  LIPASE, BLOOD  I-STAT TROPONIN, ED    EKG  EKG Interpretation  Date/Time:  Thursday July 16 2017 11:38:43 EDT Ventricular Rate:  78 PR Interval:    QRS Duration: 85 QT Interval:  397 QTC Calculation: 453 R Axis:   49 Text Interpretation:  Sinus rhythm RSR' in V1 or V2, probably normal variant No significant change since last tracing Confirmed by Theotis Burrow [phone]) on 07/16/2017 1:56:27 PM       Radiology Dg Chest 2 View  Result Date: 07/16/2017 CLINICAL DATA:  3-4 weeks of  cough. Onset of nausea and weakness and nervousness upon awakening today. History of hypertension and bronchitis. Former smoker. EXAM: CHEST  2 VIEW COMPARISON:  PA and lateral chest x-ray of August 07, 2016 FINDINGS: The lungs are mildly hyperinflated. The interstitial markings are coarse though stable. There is no alveolar infiltrate. The heart and pulmonary vascularity are normal. There is calcification in the wall of the aortic arch. The mediastinum is normal in width. The bony thorax is unremarkable. IMPRESSION: Chronic bronchitic-smoking related changes. No pneumonia, CHF, nor other acute cardiopulmonary abnormality. Thoracic aortic atherosclerosis. Electronically Signed   By: David  Martinique M.D.   On: 07/16/2017 15:09    Procedures Procedures (including critical care time)  Medications Ordered in ED Medications  ipratropium-albuterol (DUONEB) 0.5-2.5 (3) MG/3ML nebulizer solution 3 mL (3 mLs Nebulization Given 07/16/17 1245)  ondansetron (ZOFRAN) injection 4 mg (4 mg Intravenous Given 07/16/17 1355)  methylPREDNISolone sodium succinate (SOLU-MEDROL) 125 mg/2 mL injection 125 mg (125 mg Intravenous Given 07/16/17 1353)  AEROCHAMBER PLUS FLO-VU MEDIUM MISC 1 each (1 each Other Given 07/16/17 1705)     Initial Impression / Assessment and Plan / ED Course  I have reviewed the triage vital signs and the nursing notes.  Pertinent labs & imaging results that were available during my care of the patient were reviewed by me and considered in my medical decision making (see chart for details).    PT w/ generalized weakness, malaise, and lightheadedness this morning. She does note several weeks of cough and wheezing as well as shortness of breath on exertion. On exam, she was comfortable and alert with normal vital signs, afebrile. She had normal work of breathing but she did have inspiratory and 3 wheezes bilaterally and was mildly diminished. She has had a cough without significant sputum production  for several weeks. She does report distant history of smoking. Gave Solu-Medrol and a DuoNeb and obtained above labs as well as chest x-ray. Also gave Zofran. No complaints of pain whatsoever.  Her workup shows normal CBC, reassuring CMP, negative troponin, normal UA. No evidence of dehydration. Her chest x-ray shows no acute infiltrate. She does have some smoking related changes chronically in  her lungs and I suspect she may have mild COPD. On reexamination she states she feels much better after receiving breathing treatment. Repeat auscultation shows improvement in her wheezing. She has had no abdominal pain to suggest intra-abdominal process or diaphoresis, chest pain, or other symptoms to suggest cardiac process. Because her cough has been persistent and mostly a dry cough with no leukocytosis or infiltrate on exam, I do not feel she needs antibiotics at this time but I have given a course of prednisone as well as albuterol to use at home. She will follow-up with her PCP in a few days for reexamination. She has voiced understanding of return precautions. Patient discharged in satisfactory condition. Final Clinical Impressions(s) / ED Diagnoses   Final diagnoses:  Viral URI with cough  Bronchitis    New Prescriptions New Prescriptions   ALBUTEROL (PROVENTIL HFA;VENTOLIN HFA) 108 (90 BASE) MCG/ACT INHALER    Inhale 2 puffs into the lungs every 4 (four) hours as needed for wheezing or shortness of breath.   ONDANSETRON (ZOFRAN ODT) 4 MG DISINTEGRATING TABLET    Take 1 tablet (4 mg total) by mouth every 8 (eight) hours as needed for nausea or vomiting.   PREDNISONE (DELTASONE) 20 MG TABLET    Take 2 tablets (40 mg total) by mouth daily.     Little, Wenda Overland, MD 07/16/17 [phone]

## 2017-07-16 NOTE — ED Notes (Signed)
Walked patient to the bathroom patient did well 

## 2017-07-16 NOTE — ED Notes (Signed)
Got patient hooked up to the monitor did ekg shown to dr little

## 2017-07-16 NOTE — ED Notes (Signed)
Patient transported to X-ray 

## 2017-07-17 ENCOUNTER — Telehealth: Payer: Self-pay | Admitting: Internal Medicine

## 2017-07-17 MED ORDER — OLMESARTAN MEDOXOMIL 40 MG PO TABS: 40.0000 mg | ORAL_TABLET | Freq: Every day | ORAL | 2 refills | Status: DC

## 2017-07-17 NOTE — Telephone Encounter (Signed)
Notified pt new BP med has been sent to [facility].../lmb

## 2017-07-17 NOTE — Telephone Encounter (Signed)
Pt called in and said that she got the letter for the recall for the valsartan   What can she be switched to ?

## 2017-07-17 NOTE — Telephone Encounter (Signed)
Benicar (olmesartan) sent to pharmacy.

## 2017-07-20 ENCOUNTER — Ambulatory Visit (INDEPENDENT_AMBULATORY_CARE_PROVIDER_SITE_OTHER): Payer: Medicare Other | Admitting: Nurse Practitioner

## 2017-07-20 ENCOUNTER — Encounter: Payer: Self-pay | Admitting: Nurse Practitioner

## 2017-07-20 VITALS — BP 130/60 | HR 74 | Temp 99.0°F | Ht 63.0 in | Wt 162.0 lb

## 2017-07-20 DIAGNOSIS — L84 Corns and callosities: Secondary | ICD-10-CM

## 2017-07-20 DIAGNOSIS — B9789 Other viral agents as the cause of diseases classified elsewhere: Secondary | ICD-10-CM | POA: Diagnosis not present

## 2017-07-20 DIAGNOSIS — E782 Mixed hyperlipidemia: Secondary | ICD-10-CM | POA: Diagnosis not present

## 2017-07-20 DIAGNOSIS — J069 Acute upper respiratory infection, unspecified: Secondary | ICD-10-CM | POA: Diagnosis not present

## 2017-07-20 DIAGNOSIS — B353 Tinea pedis: Secondary | ICD-10-CM

## 2017-07-20 DIAGNOSIS — F419 Anxiety disorder, unspecified: Secondary | ICD-10-CM | POA: Diagnosis not present

## 2017-07-20 MED ORDER — MICONAZOLE NITRATE 2 % EX POWD: Freq: Two times a day (BID) | CUTANEOUS | 0 refills | Status: DC

## 2017-07-20 MED ORDER — ALPRAZOLAM 0.5 MG PO TABS: 0.5000 mg | ORAL_TABLET | Freq: Every day | ORAL | 0 refills | Status: DC | PRN

## 2017-07-20 MED ORDER — ATORVASTATIN CALCIUM 20 MG PO TABS: 20.0000 mg | ORAL_TABLET | Freq: Every day | ORAL | 1 refills | Status: DC

## 2017-07-20 NOTE — Progress Notes (Signed)
Subjective:  Patient ID: Jamie [name], female    DOB: July 07, 1933  Age: 83 y.o. MRN: [id number]  CC: Hospitalization Follow-up (hospital follow--no complain just want to make sure everything clear/refill request)   Rash  This is a recurrent problem. The current episode started more than 1 year ago. The problem has been waxing and waning since onset. The affected locations include the right toes and left toes. The rash is characterized by blistering, itchiness and scaling. She was exposed to nothing. Associated symptoms include nail changes. Past treatments include anti-itch cream. The treatment provided mild relief.   URI: Cough improved with oral prednisone and albuterol inhaler. No fever, no SOB, no chest pain, no palpitation   Anxiety: Stable with prn use of xanax. Last prescription filled 01/2017.  Hyperlipidemia: No adverse effects with lipitor  Outpatient Medications Prior to Visit  Medication Sig Dispense Refill  . albuterol (PROVENTIL HFA;VENTOLIN HFA) 108 (90 Base) MCG/ACT inhaler Inhale 2 puffs into the lungs every 4 (four) hours as needed for wheezing or shortness of breath. 1 Inhaler 0  . alum & mag hydroxide-simeth (MYLANTA) 382-505-39 MG/5ML suspension Take 15 mLs by mouth every 6 (six) hours as needed for indigestion or heartburn.    Marland Kitchen amLODipine (NORVASC) 5 MG tablet TAKE 1 TABLET BY MOUTH DAILY (Patient taking differently: TAKE 5mg  BY MOUTH DAILY) 90 tablet 2  . diclofenac sodium (VOLTAREN) 1 % GEL Apply 4 g topically 4 (four) times daily. 100 g 0  . furosemide (LASIX) 40 MG tablet TAKE 1 TABLET BY MOUTH EVERY DAY (Patient taking differently: TAKE 40mg  BY MOUTH EVERY DAY) 90 tablet 1  . guaiFENesin (MUCINEX) 600 MG 12 hr tablet Take 1 tablet (600 mg total) by mouth 2 (two) times daily as needed for cough or to loosen phlegm. (Patient not taking: Reported on 07/16/2017) 14 tablet 0  . olmesartan (BENICAR) 40 MG tablet Take 1 tablet (40 mg total) by mouth daily. 30  tablet 2  . ondansetron (ZOFRAN ODT) 4 MG disintegrating tablet Take 1 tablet (4 mg total) by mouth every 8 (eight) hours as needed for nausea or vomiting. 6 tablet 0  . predniSONE (DELTASONE) 20 MG tablet Take 2 tablets (40 mg total) by mouth daily. 10 tablet 0  . ranitidine (ZANTAC) 150 MG tablet TAKE 1 TABLET BY MOUTH TWICE A DAY (Patient taking differently: TAKE 150mg  BY MOUTH once daily) 180 tablet 1  . ALPRAZolam (XANAX) 0.5 MG tablet Take 1 tablet (0.5 mg total) by mouth daily as needed for anxiety. 20 tablet 0  . atorvastatin (LIPITOR) 20 MG tablet Take 1 tablet (20 mg total) by mouth daily. Annual appt due in July must see MD for refills 30 tablet 0  . clotrimazole-betamethasone (LOTRISONE) cream APPLY 1 APPLICATION TOPICALLY 2 (TWO) TIMES DAILY AS NEEDED (RASH). 30 g 0   No facility-administered medications prior to visit.     ROS See HPI  Objective:  BP 130/60   Pulse 74   Temp 99 F (37.2 C)   Ht 5\' 3"  (1.6 m)   Wt 162 lb (73.5 kg)   SpO2 100%   BMI 28.70 kg/m   BP Readings from Last 3 Encounters:  07/20/17 130/60  07/16/17 115/60  05/26/17 128/68    Wt Readings from Last 3 Encounters:  07/20/17 162 lb (73.5 kg)  07/16/17 163 lb (73.9 kg)  05/26/17 163 lb (73.9 kg)    Physical Exam  Constitutional: She is oriented to person, place, and time.  Cardiovascular:  Normal rate.   Pulmonary/Chest: Effort normal and breath sounds normal. No respiratory distress.  Musculoskeletal:       Feet:  Neurological: She is alert and oriented to person, place, and time.  Skin: Skin is warm and dry. Rash noted. Rash is macular. No erythema.  Macerated skin between toes. Tender callus on heels and   Vitals reviewed.   Lab Results  Component Value Date   WBC 7.1 07/16/2017   HGB 12.5 07/16/2017   HCT 38.0 07/16/2017   PLT 173 07/16/2017   GLUCOSE 104 (H) 07/16/2017   CHOL 139 08/15/2016   TRIG 58.0 08/15/2016   HDL 46.20 08/15/2016   LDLCALC 81 08/15/2016   ALT 14  07/16/2017   AST 20 07/16/2017   NA 140 07/16/2017   K 3.5 07/16/2017   CL 108 07/16/2017   CREATININE 0.72 07/16/2017   BUN 9 07/16/2017   CO2 23 07/16/2017   TSH 1.12 04/28/2012    Dg Chest 2 View  Result Date: 07/16/2017 CLINICAL DATA:  3-4 weeks of cough. Onset of nausea and weakness and nervousness upon awakening today. History of hypertension and bronchitis. Former smoker. EXAM: CHEST  2 VIEW COMPARISON:  PA and lateral chest x-ray of August 07, 2016 FINDINGS: The lungs are mildly hyperinflated. The interstitial markings are coarse though stable. There is no alveolar infiltrate. The heart and pulmonary vascularity are normal. There is calcification in the wall of the aortic arch. The mediastinum is normal in width. The bony thorax is unremarkable. IMPRESSION: Chronic bronchitic-smoking related changes. No pneumonia, CHF, nor other acute cardiopulmonary abnormality. Thoracic aortic atherosclerosis. Electronically Signed   By: David  Martinique M.D.   On: 07/16/2017 15:09    Assessment & Plan:   [name] was seen today for hospitalization follow-up.  Diagnoses and all orders for this visit:  Anxiety -     ALPRAZolam (XANAX) 0.5 MG tablet; Take 1 tablet (0.5 mg total) by mouth daily as needed for anxiety.  Mixed hyperlipidemia -     atorvastatin (LIPITOR) 20 MG tablet; Take 1 tablet (20 mg total) by mouth daily at 6 PM.  Viral URI with cough  Callus of foot -     miconazole (MICOTIN) 2 % powder; Apply topically 2 times daily at 12 noon and 4 pm. -     Ambulatory referral to Podiatry  Tinea pedis of both feet -     miconazole (MICOTIN) 2 % powder; Apply topically 2 times daily at 12 noon and 4 pm. -     Ambulatory referral to Podiatry   I have discontinued Ms. Disney's clotrimazole-betamethasone. I have also changed her atorvastatin. Additionally, I am having her start on miconazole. Lastly, I am having her maintain her guaiFENesin, ranitidine, diclofenac sodium, amLODipine,  furosemide, alum & mag hydroxide-simeth, predniSONE, albuterol, ondansetron, olmesartan, and ALPRAZolam.  Meds ordered this encounter  Medications  . ALPRAZolam (XANAX) 0.5 MG tablet    Sig: Take 1 tablet (0.5 mg total) by mouth daily as needed for anxiety.    Dispense:  20 tablet    Refill:  0    This request is for a new prescription for a controlled substance as required by Federal/State law.    Order Specific Question:   Supervising Provider    Answer:   Cassandria Anger [[id number]]  . atorvastatin (LIPITOR) 20 MG tablet    Sig: Take 1 tablet (20 mg total) by mouth daily at 6 PM.    Dispense:  90 tablet  Refill:  1    Order Specific Question:   Supervising Provider    Answer:   Cassandria Anger [[id number]]  . miconazole (MICOTIN) 2 % powder    Sig: Apply topically 2 times daily at 12 noon and 4 pm.    Dispense:  70 g    Refill:  0    Order Specific Question:   Supervising Provider    Answer:   Cassandria Anger [[id number]]    Follow-up: Return in about 6 months (around 01/20/2018) for hyperlipidemia and HTN and Dr, Sharlet Salina.  Wilfred Lacy, NP

## 2017-07-20 NOTE — Patient Instructions (Addendum)
Complete medication as prescribed  Given printed prescription of xanax today.

## 2017-08-13 ENCOUNTER — Ambulatory Visit (INDEPENDENT_AMBULATORY_CARE_PROVIDER_SITE_OTHER): Payer: Medicare Other | Admitting: Podiatry

## 2017-08-13 ENCOUNTER — Encounter: Payer: Self-pay | Admitting: Podiatry

## 2017-08-13 ENCOUNTER — Ambulatory Visit (INDEPENDENT_AMBULATORY_CARE_PROVIDER_SITE_OTHER): Payer: Medicare Other

## 2017-08-13 VITALS — BP 137/67 | HR 88 | Resp 16

## 2017-08-13 DIAGNOSIS — L84 Corns and callosities: Secondary | ICD-10-CM

## 2017-08-13 DIAGNOSIS — M21619 Bunion of unspecified foot: Secondary | ICD-10-CM

## 2017-08-13 NOTE — Progress Notes (Signed)
Subjective:    Patient ID: Jamie [name], female   DOB: 83 y.o.   MRN: [id number]   HPI patient presents with very painful lesions on the plantar aspect of both feet with the distal left second metatarsal being the worse    Review of Systems  All other systems reviewed and are negative.       Objective:  Physical Exam  Constitutional: She appears well-developed and well-nourished.  Cardiovascular: Intact distal pulses.   Pulmonary/Chest: Effort normal.  Musculoskeletal: Normal range of motion.  Neurological: She is alert.  Skin: Skin is warm and dry.  Nursing note and vitals reviewed.  neurovascular status was found to be intact with significant structural flatfoot deformity bilateral and large bunion formation bilateral with hyperostosis around the medial aspect left over right. Distal keratotic lesion second metatarsal left over right foot that's very painful when pressed and makes shoe gear and walking difficult with lesion left heel and right hallux     Assessment:    Structural HAV deformity with tendinitis-like symptoms and keratotic lesion formation     Plan:    H&P all conditions reviewed and utilizing sharp sterile instrumentation debridement of lesions accomplished with no iatrogenic bleeding noted. Patient tolerated procedure well and will be seen back to recheck

## 2017-08-13 NOTE — Progress Notes (Signed)
   Subjective:    Patient ID: Jamie [name], female    DOB: 1933/01/13, 83 y.o.   MRN: [id number]  HPI Chief Complaint  Patient presents with  . Painful lesion    Left foot; bottom of heel & plantar forefoot-below 2nd toe; x3 months  . Callouses    Left foot; 2nd toe-lateral side; 3rd toe-medial side      Review of Systems  All other systems reviewed and are negative.      Objective:   Physical Exam        Assessment & Plan:

## 2017-08-17 ENCOUNTER — Ambulatory Visit: Payer: Medicare Other | Admitting: Podiatry

## 2017-08-20 ENCOUNTER — Telehealth: Payer: Self-pay | Admitting: Internal Medicine

## 2017-08-20 DIAGNOSIS — F419 Anxiety disorder, unspecified: Secondary | ICD-10-CM

## 2017-08-20 NOTE — Telephone Encounter (Signed)
Pt husband died this morning and is having a hard time she would like a refil of her ALPRAZolam (XANAX) 0.5 MG tablet   and ondansetron (ZOFRAN ODT) 4 MG disintegrating tablet Please advise

## 2017-08-21 MED ORDER — ALPRAZOLAM 0.5 MG PO TABS: 0.5000 mg | ORAL_TABLET | Freq: Every day | ORAL | 0 refills | Status: DC | PRN

## 2017-08-21 MED ORDER — ONDANSETRON 4 MG PO TBDP: 4.0000 mg | ORAL_TABLET | Freq: Three times a day (TID) | ORAL | 0 refills | Status: DC | PRN

## 2017-08-21 NOTE — Telephone Encounter (Signed)
Sent in zofran and printed and signed xanax for fax.

## 2017-08-21 NOTE — Telephone Encounter (Signed)
Not[facility].../lmb

## 2017-09-17 ENCOUNTER — Encounter: Payer: Self-pay | Admitting: Internal Medicine

## 2017-09-17 ENCOUNTER — Ambulatory Visit (INDEPENDENT_AMBULATORY_CARE_PROVIDER_SITE_OTHER): Payer: Medicare Other | Admitting: Internal Medicine

## 2017-09-17 ENCOUNTER — Ambulatory Visit: Payer: Medicare Other

## 2017-09-17 DIAGNOSIS — R05 Cough: Secondary | ICD-10-CM | POA: Diagnosis not present

## 2017-09-17 DIAGNOSIS — R059 Cough, unspecified: Secondary | ICD-10-CM

## 2017-09-17 MED ORDER — DOXYCYCLINE HYCLATE 100 MG PO TABS: 100.0000 mg | ORAL_TABLET | Freq: Two times a day (BID) | ORAL | 0 refills | Status: DC

## 2017-09-17 MED ORDER — PREDNISONE 20 MG PO TABS: 40.0000 mg | ORAL_TABLET | Freq: Every day | ORAL | 0 refills | Status: DC

## 2017-09-17 NOTE — Patient Instructions (Signed)
We have sent in the antibiotic called doxycycline to take 1 pill twice a day for 1 week.   We have also sent in prednisone to help you get better quicker with the breathing to take 2 pills daily for 5 days.

## 2017-09-17 NOTE — Progress Notes (Signed)
   Subjective:    Patient ID: Jamie [name], female    DOB: 25-Nov-1933, 83 y.o.   MRN: [id number]  HPI The patient is an 83 YO female coming in for cough and wheezing. She has had the symptoms for about 2 weeks. She is having SOB and wheezing with activity. Using albuterol inhaler more lately and feels it is a little effective. Stable since onset. No fevers but some chills. She is coughing up some sputum. Denies nasal drainage or ear pain. Some sinus tenderness. Decrease in energy. Tried otc cold medications without improvement.   Review of Systems  Constitutional: Positive for activity change, appetite change, chills and fatigue. Negative for fever and unexpected weight change.  HENT: Positive for congestion and rhinorrhea. Negative for ear discharge, ear pain, postnasal drip, sinus pain, sinus pressure, tinnitus, trouble swallowing and voice change.   Eyes: Negative.   Respiratory: Positive for cough and shortness of breath. Negative for chest tightness.   Cardiovascular: Negative for chest pain, palpitations and leg swelling.  Gastrointestinal: Positive for nausea. Negative for abdominal distention, abdominal pain, constipation, diarrhea and vomiting.  Musculoskeletal: Negative.   Skin: Negative.       Objective:   Physical Exam  Constitutional: She is oriented to person, place, and time. She appears well-developed and well-nourished.  HENT:  Head: Normocephalic and atraumatic.  Right Ear: External ear normal.  Left Ear: External ear normal.  Oropharynx with redness and mild drainage, nose without swelling.   Eyes: EOM are normal.  Neck: Normal range of motion. No tracheal deviation present. No thyromegaly present.  Cardiovascular: Normal rate and regular rhythm.   Pulmonary/Chest: Effort normal. No respiratory distress. She has wheezes. She has no rales.  Abdominal: Soft. She exhibits no distension. There is no tenderness. There is no rebound.  Musculoskeletal: She exhibits no  edema.  Lymphadenopathy:    She has no cervical adenopathy.  Neurological: She is alert and oriented to person, place, and time. Coordination normal.  Skin: Skin is warm and dry.   Vitals:   09/17/17 0947  BP: 130/60  Pulse: 70  Temp: 97.8 F (36.6 C)  TempSrc: Oral  SpO2: 100%  Weight: 159 lb (72.1 kg)  Height: 5\' 3"  (1.6 m)      Assessment & Plan:

## 2017-09-17 NOTE — Assessment & Plan Note (Signed)
Likely bronchitis and rx for doxycycline and prednisone. She will use albuterol prn.

## 2017-09-18 ENCOUNTER — Other Ambulatory Visit: Payer: Self-pay | Admitting: Family

## 2017-09-22 ENCOUNTER — Telehealth: Payer: Self-pay | Admitting: Internal Medicine

## 2017-09-22 NOTE — Telephone Encounter (Signed)
This likely was the prednisone. Would recommend to finish antibiotic, make sure to stay hydrated and let us know if symptoms worsen. They should improve off prednisone.

## 2017-09-22 NOTE — Telephone Encounter (Signed)
Patient called and informed. Patient stated understanding

## 2017-09-22 NOTE — Telephone Encounter (Signed)
Pt called stating her knees are shaking and she is uneasy She checked her BP and it was 134/59 Please call back in regard

## 2017-09-22 NOTE — Telephone Encounter (Signed)
Patient finished taking Rx for predinone has not finished the antibiotic yet, but states her legs were shaky making it hard for her to stand up. She states she feels good but has a nervous feeling and knees are shaking. She is wondering if you want her to stop antibiotic or if it could of been the prednisone, or if she needs to come in for a visit.

## 2017-09-25 ENCOUNTER — Ambulatory Visit (INDEPENDENT_AMBULATORY_CARE_PROVIDER_SITE_OTHER): Payer: Medicare Other | Admitting: Internal Medicine

## 2017-09-25 ENCOUNTER — Other Ambulatory Visit (INDEPENDENT_AMBULATORY_CARE_PROVIDER_SITE_OTHER): Payer: Medicare Other

## 2017-09-25 ENCOUNTER — Encounter: Payer: Self-pay | Admitting: Internal Medicine

## 2017-09-25 VITALS — BP 138/76 | HR 76 | Temp 98.6°F | Ht 63.0 in | Wt 159.0 lb

## 2017-09-25 DIAGNOSIS — M791 Myalgia, unspecified site: Secondary | ICD-10-CM

## 2017-09-25 DIAGNOSIS — R059 Cough, unspecified: Secondary | ICD-10-CM

## 2017-09-25 DIAGNOSIS — R05 Cough: Secondary | ICD-10-CM | POA: Diagnosis not present

## 2017-09-25 LAB — COMPREHENSIVE METABOLIC PANEL
ALBUMIN: 4.1 g/dL (ref 3.5–5.2)
ALT: 12 U/L (ref 0–35)
AST: 13 U/L (ref 0–37)
Alkaline Phosphatase: 152 U/L — ABNORMAL HIGH (ref 39–117)
BUN: 10 mg/dL (ref 6–23)
CHLORIDE: 106 meq/L (ref 96–112)
CO2: 28 mEq/L (ref 19–32)
CREATININE: 0.76 mg/dL (ref 0.40–1.20)
Calcium: 9.2 mg/dL (ref 8.4–10.5)
GFR: 93.56 mL/min (ref >60.00)
Glucose, Bld: 119 mg/dL — ABNORMAL HIGH (ref 70–99)
Potassium: 3.2 mEq/L — ABNORMAL LOW (ref 3.5–5.1)
SODIUM: 142 meq/L (ref 135–145)
Total Bilirubin: 0.9 mg/dL (ref 0.2–1.2)
Total Protein: 6.8 g/dL (ref 6.0–8.3)

## 2017-09-25 LAB — CK: CK TOTAL: 51 U/L (ref 7–177)

## 2017-09-25 MED ORDER — BENZONATATE 200 MG PO CAPS: 200.0000 mg | ORAL_CAPSULE | Freq: Two times a day (BID) | ORAL | 0 refills | Status: DC | PRN

## 2017-09-25 NOTE — Patient Instructions (Signed)
We are checking the blood work today for the muscle numbers and potassium.  It is okay to use a cane for the next couple of days if you need it.   Call us back in about a week if not better.

## 2017-09-25 NOTE — Progress Notes (Signed)
   Subjective:    Patient ID: Jamie [name], female    DOB: [birth year], 83 y.o.   MRN: [id number]  HPI The patient is an 83 YO female coming in for some shakiness and weakness of her knees. This started while she was taking prednisone and doxycyline for her cough. She is now finished with the prednisone for 1-2 days and still 1 more day of doxycycline. Started 4 days ago and stable since onset. Denies falling or giving out. No swelling or pain in the knees.   Review of Systems  Constitutional: Negative.   Respiratory: Positive for cough. Negative for chest tightness and shortness of breath.   Cardiovascular: Negative for chest pain, palpitations and leg swelling.  Gastrointestinal: Negative for abdominal distention, abdominal pain, constipation, diarrhea, nausea and vomiting.  Musculoskeletal: Positive for myalgias.       Shakiness of the knees  Skin: Negative.   Neurological: Negative.   Psychiatric/Behavioral: Negative.       Objective:   Physical Exam  Constitutional: She is oriented to person, place, and time. She appears well-developed and well-nourished.  HENT:  Head: Normocephalic and atraumatic.  Eyes: EOM are normal.  Neck: Normal range of motion.  Cardiovascular: Normal rate and regular rhythm.   Pulmonary/Chest: Effort normal and breath sounds normal. No respiratory distress. She has no wheezes. She has no rales.  Abdominal: Soft. Bowel sounds are normal. She exhibits no distension. There is no tenderness. There is no rebound.  Musculoskeletal: She exhibits no edema or tenderness.  No swelling or tenderness in the knees  Neurological: She is alert and oriented to person, place, and time. Coordination normal.  Skin: Skin is warm and dry.   Vitals:   09/25/17 0922  BP: 138/76  Pulse: 76  Temp: 98.6 F (37 C)  TempSrc: Oral  SpO2: 96%  Weight: 159 lb (72.1 kg)  Height: 5\' 3"  (1.6 m)      Assessment & Plan:

## 2017-09-25 NOTE — Assessment & Plan Note (Signed)
Will finish doxycycline and rx for tessalon perles. Likely myalgia from the prednisone which should improve on its own. Checking CMP and CK to rule out underlying problems.

## 2017-10-06 ENCOUNTER — Ambulatory Visit (INDEPENDENT_AMBULATORY_CARE_PROVIDER_SITE_OTHER): Payer: Medicare Other | Admitting: General Practice

## 2017-10-06 DIAGNOSIS — Z23 Encounter for immunization: Secondary | ICD-10-CM | POA: Diagnosis not present

## 2017-10-08 LAB — HM MAMMOGRAPHY

## 2017-10-09 ENCOUNTER — Other Ambulatory Visit: Payer: Self-pay

## 2017-10-09 MED ORDER — OLMESARTAN MEDOXOMIL 40 MG PO TABS: 40.0000 mg | ORAL_TABLET | Freq: Every day | ORAL | 2 refills | Status: DC

## 2017-10-14 ENCOUNTER — Encounter: Payer: Self-pay | Admitting: Internal Medicine

## 2017-10-18 IMAGING — DX DG CHEST 2V
2 series · 2 of 2 positions shown · non-contrast
Comparison: CT 10/23/2015.  Chest x-ray 09/08/2014.

CLINICAL DATA: Cough and shortness of breath.

EXAM:
CHEST  2 VIEW

[chest pa]
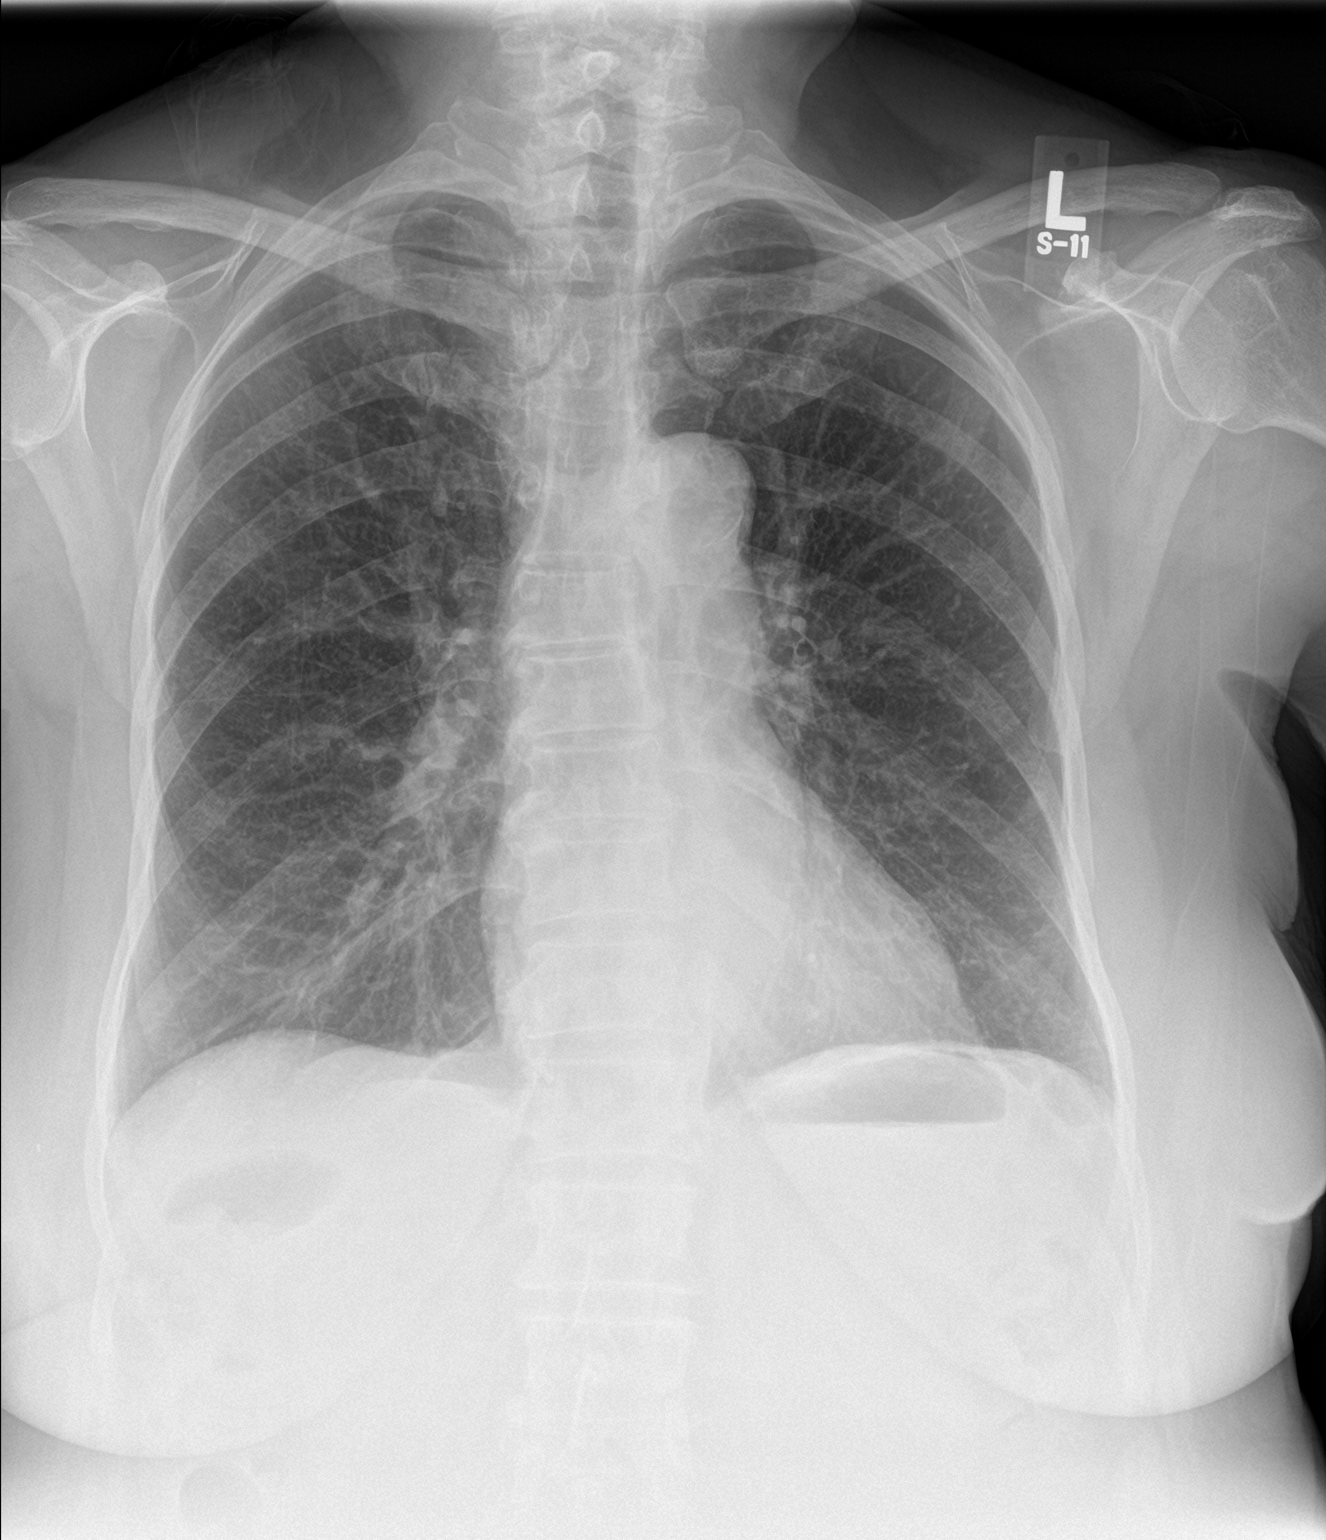

[chest lat]
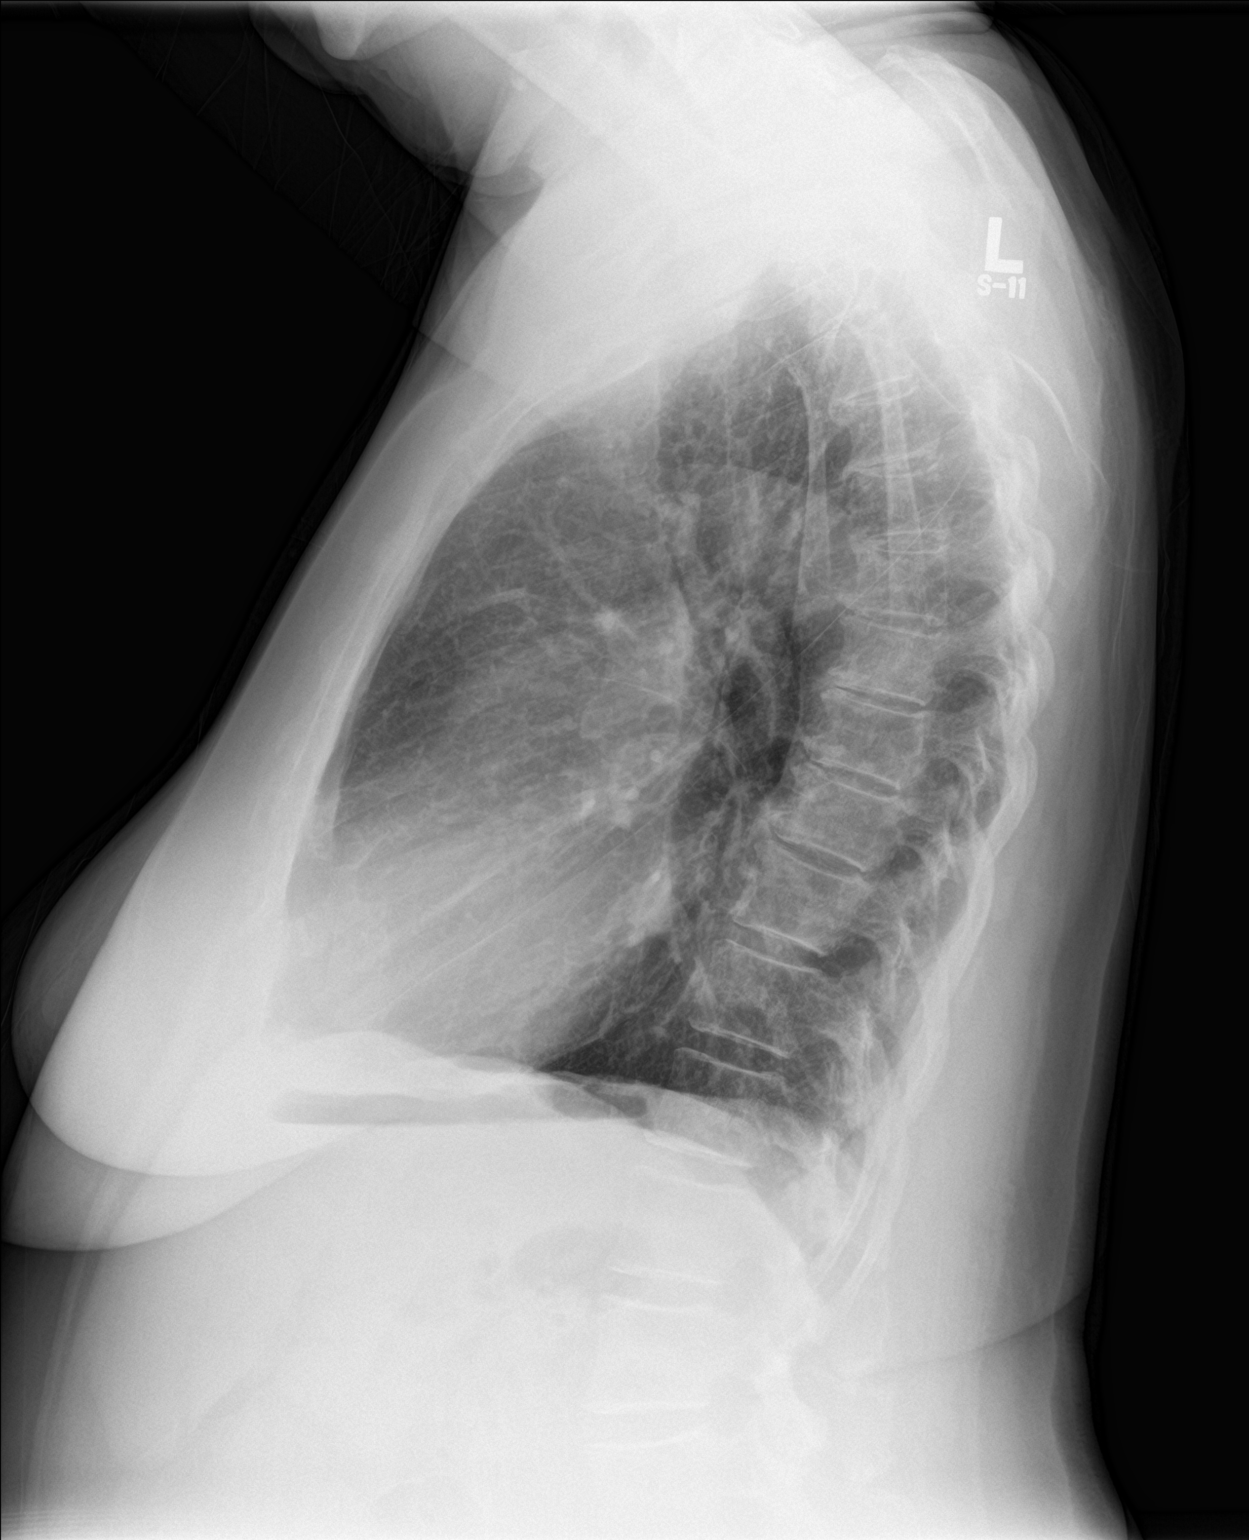

[2 of 2 positions shown; findings below may reference images not displayed]

FINDINGS: Mediastinum hilar structures normal. Borderline cardiomegaly. No
pulmonary venous congestion. No focal infiltrate. No pleural
effusion or pneumothorax. Degenerative changes thoracic spine.
IMPRESSION: 1. No acute pulmonary disease.

2. Borderline cardiomegaly.  No pulmonary venous congestion.

## 2017-10-28 ENCOUNTER — Other Ambulatory Visit: Payer: Self-pay | Admitting: Internal Medicine

## 2017-11-13 ENCOUNTER — Ambulatory Visit: Payer: Medicare Other

## 2017-11-17 ENCOUNTER — Encounter: Payer: Self-pay | Admitting: Internal Medicine

## 2017-11-17 ENCOUNTER — Ambulatory Visit: Payer: Medicare Other | Admitting: Internal Medicine

## 2017-11-17 DIAGNOSIS — R059 Cough, unspecified: Secondary | ICD-10-CM

## 2017-11-17 DIAGNOSIS — R05 Cough: Secondary | ICD-10-CM

## 2017-11-17 MED ORDER — DOXYCYCLINE HYCLATE 100 MG PO TABS: 100.0000 mg | ORAL_TABLET | Freq: Two times a day (BID) | ORAL | 0 refills | Status: DC

## 2017-11-17 MED ORDER — PREDNISONE 20 MG PO TABS: 40.0000 mg | ORAL_TABLET | Freq: Every day | ORAL | 0 refills | Status: DC

## 2017-11-17 NOTE — Assessment & Plan Note (Signed)
Rx for prednisone and doxycycline for the wheezing on exam. She can continue using tessalon perles she has left at home. If no improvement needs repeat visit and CXR.

## 2017-11-17 NOTE — Progress Notes (Signed)
   Subjective:    Patient ID: Jamie [name], female    DOB: 1933-04-27, 83 y.o.   MRN: [id number]  HPI The patient is an 83 YO female coming in for cough going on for about 2 weeks. Coughing up clear mucus. She is having some mild SOB. She denies fevers or chills. Former smoker and not smoking. Some nose congestion and drainage. Taking mucinex over the counter. Some wheezing with walking. No ear pain or headaches. Overall worsening. Decreased activity and appetite.   Review of Systems  Constitutional: Positive for activity change and appetite change. Negative for chills, fatigue, fever and unexpected weight change.  HENT: Positive for congestion, postnasal drip, rhinorrhea and sinus pressure. Negative for ear discharge, ear pain, sinus pain, sneezing, sore throat, tinnitus, trouble swallowing and voice change.   Eyes: Negative.   Respiratory: Positive for cough, shortness of breath and wheezing. Negative for chest tightness.   Cardiovascular: Negative.   Gastrointestinal: Negative.   Neurological: Negative.       Objective:   Physical Exam  Constitutional: She is oriented to person, place, and time. She appears well-developed and well-nourished.  HENT:  Head: Normocephalic and atraumatic.  Oropharynx with redness and clear drainage, TMs normal bilaterally  Eyes: EOM are normal.  Neck: Normal range of motion. No thyromegaly present.  Cardiovascular: Normal rate and regular rhythm.  Pulmonary/Chest: Effort normal. No respiratory distress. She has wheezes. She has no rales.  Some wheezing which does not clear with coughing.   Abdominal: Soft.  Musculoskeletal: She exhibits tenderness.  Lymphadenopathy:    She has no cervical adenopathy.  Neurological: She is alert and oriented to person, place, and time.  Skin: Skin is warm and dry.   Vitals:   11/17/17 1005  BP: 138/60  Pulse: 94  Temp: 98.7 F (37.1 C)  TempSrc: Oral  SpO2: 100%  Weight: 162 lb (73.5 kg)  Height: 5\' 3"   (1.6 m)      Assessment & Plan:

## 2017-11-17 NOTE — Patient Instructions (Signed)
We have sent in prednisone to take 2 pills daily for 5 days.  We have also sent in the antibiotic doxycycline to take 1 pill twice a day for 1 week.

## 2017-12-04 ENCOUNTER — Other Ambulatory Visit: Payer: Self-pay

## 2017-12-04 MED ORDER — OLMESARTAN MEDOXOMIL 40 MG PO TABS: 40.0000 mg | ORAL_TABLET | Freq: Every day | ORAL | 2 refills | Status: DC

## 2017-12-10 ENCOUNTER — Telehealth: Payer: Self-pay | Admitting: Internal Medicine

## 2017-12-11 NOTE — Telephone Encounter (Signed)
Inform pt refill was sent in on yesterday...Jamie [name]

## 2017-12-11 NOTE — Telephone Encounter (Signed)
Patient checking status. Please Advise

## 2017-12-24 ENCOUNTER — Ambulatory Visit: Payer: Self-pay

## 2017-12-24 NOTE — Telephone Encounter (Signed)
Pt. called to report concern of hair thinning on top of scalp.  Reported this has increased since Christmas.  Also reported her scalp itches.  Denied any redness, or scabs of the scalp.  Per protocol, appt. scheduled to see PCP.  Care advice per protocol.  Verb. Understanding; agrees with plan.    Reason for Disposition . [1] Hair thinning, hair loss, or balding AND [2] cause not known  (e.g., no recent precipitating factors such as childbirth, weight loss surgery)?  Answer Assessment - Initial Assessment Questions 1. LOCATION: "Where is the hair loss?" (e.g., all of scalp, parts of scalp, back of head or neck)     Top of scalp 2. DESCRIPTION: "Please describe it.? (e.g., thinning of hair, balding, patches of hair missing)     Thinning / balding in hairline above forehead 3. ONSET: "When did the hair loss begin?" (e.g., sudden or gradual onset; days, weeks, months or years ago)     Just before Christmas 4. OTHER SYMPTOMS: "What does the scalp look like where the hair is missing?" (e.g., normal, redness, crusts, scarring)     Itches; denies redness or scabbing 5. OTHER FACTORS: "Have you had any of the following recently: childbirth, severe illness or injury, major surgery, major weight loss, cancer chemo, tight hair braids, serious stress?"    Upper resp illness around Christmas; stated has had increased stress as her husband passed away in August 31, 2023; denied depression.  6. CAUSE: "What do you think is causing the hair loss?"    No depression  Protocols used: HAIR LOSS-A-AH

## 2017-12-25 ENCOUNTER — Ambulatory Visit: Payer: Medicare Other | Admitting: Internal Medicine

## 2017-12-25 ENCOUNTER — Encounter: Payer: Self-pay | Admitting: Internal Medicine

## 2017-12-25 DIAGNOSIS — L659 Nonscarring hair loss, unspecified: Secondary | ICD-10-CM

## 2017-12-25 MED ORDER — LOSARTAN POTASSIUM 100 MG PO TABS: 100.0000 mg | ORAL_TABLET | Freq: Every day | ORAL | 3 refills | Status: DC

## 2017-12-25 NOTE — Progress Notes (Signed)
   Subjective:    Patient ID: Jamie [name], female    DOB: 26-Sep-1933, 84 y.o.   MRN: [id number]  HPI The patient is an 84 YO female coming in for hair loss and thinning. Mostly on the top of her head but worse in the last month or two. Since christmas more hair coming out with brushing. Denies bald patch or spot. No itching or burning on the scalp. No change in hair products. Last coloring was September and no changes to that in some time. Denies weight change. No fevers or chills. No injury or trauma.   Review of Systems  Constitutional: Negative.   HENT: Negative.        Hair loss  Eyes: Negative.   Respiratory: Negative for cough, chest tightness and shortness of breath.   Cardiovascular: Negative for chest pain, palpitations and leg swelling.  Gastrointestinal: Negative for abdominal distention, abdominal pain, constipation, diarrhea, nausea and vomiting.  Musculoskeletal: Negative.   Skin: Negative.   Neurological: Negative.       Objective:   Physical Exam  Constitutional: She is oriented to person, place, and time. She appears well-developed and well-nourished.  HENT:  Head: Normocephalic and atraumatic.  Some hair thinning on the scalp, no rash or lesion.   Eyes: EOM are normal.  Neck: Normal range of motion.  Cardiovascular: Normal rate and regular rhythm.  Pulmonary/Chest: Effort normal and breath sounds normal. No respiratory distress. She has no wheezes. She has no rales.  Abdominal: Soft. Bowel sounds are normal. She exhibits no distension. There is no tenderness. There is no rebound.  Musculoskeletal: She exhibits no edema.  Neurological: She is alert and oriented to person, place, and time. Coordination normal.  Skin: Skin is warm and dry.   Vitals:   12/25/17 1037  BP: 122/60  Pulse: 86  Temp: 98.2 F (36.8 C)  TempSrc: Oral  SpO2: 97%  Weight: 165 lb (74.8 kg)  Height: 5\' 3"  (1.6 m)      Assessment & Plan:

## 2017-12-25 NOTE — Patient Instructions (Signed)
We have sent in the losartan to take 1 pill daily instead of olmesartan (benicar). The olmesartan could be causing the hair to come out more.

## 2017-12-25 NOTE — Assessment & Plan Note (Signed)
Possibly worse with the recent change to olmesartan which has about 1% risk of alopecia. Will change to losartan to see if this helps. She has had ongoing hair loss in the last several years which could be worsening. Advised can try head and shoulder shampoo.

## 2018-01-11 ENCOUNTER — Other Ambulatory Visit: Payer: Self-pay | Admitting: Internal Medicine

## 2018-01-15 ENCOUNTER — Other Ambulatory Visit: Payer: Self-pay | Admitting: Nurse Practitioner

## 2018-01-15 DIAGNOSIS — E782 Mixed hyperlipidemia: Secondary | ICD-10-CM

## 2018-01-31 ENCOUNTER — Other Ambulatory Visit: Payer: Self-pay | Admitting: Family

## 2018-02-01 ENCOUNTER — Encounter: Payer: Self-pay | Admitting: Internal Medicine

## 2018-02-01 ENCOUNTER — Ambulatory Visit: Payer: Medicare Other | Admitting: Internal Medicine

## 2018-02-01 DIAGNOSIS — R059 Cough, unspecified: Secondary | ICD-10-CM

## 2018-02-01 DIAGNOSIS — R05 Cough: Secondary | ICD-10-CM

## 2018-02-01 MED ORDER — PREDNISONE 20 MG PO TABS: 40.0000 mg | ORAL_TABLET | Freq: Every day | ORAL | 0 refills | Status: DC

## 2018-02-01 MED ORDER — DOXYCYCLINE HYCLATE 100 MG PO TABS: 100.0000 mg | ORAL_TABLET | Freq: Two times a day (BID) | ORAL | 0 refills | Status: DC

## 2018-02-01 NOTE — Progress Notes (Signed)
   Subjective:    Patient ID: Jamie [name], female    DOB: August 30, 1933, 84 y.o.   MRN: [id number]  HPI The patient is an 84 YO female coming in for cough and SOB. She is using albuterol more often lately. Using it about 3-4 times per day. This really helps to open her up. Started about 2 weeks ago with some cold symptoms. Just never got better. She denies fevers or chills. Denies congestion. Denies sputum but feels as though her chest is tight some times. Overall worsening. She is taking otc cold medicine without any relief.   Review of Systems  Constitutional: Positive for activity change. Negative for appetite change, chills, fatigue, fever and unexpected weight change.  HENT: Positive for congestion, postnasal drip and rhinorrhea. Negative for ear discharge, ear pain, sinus pressure, sinus pain, sneezing, sore throat, tinnitus, trouble swallowing and voice change.   Eyes: Negative.   Respiratory: Positive for cough, shortness of breath and wheezing. Negative for chest tightness.   Cardiovascular: Negative.   Gastrointestinal: Negative.   Neurological: Negative.       Objective:   Physical Exam  Constitutional: She is oriented to person, place, and time. She appears well-developed and well-nourished.  HENT:  Head: Normocephalic and atraumatic.  Oropharynx with clear drainage, TMs normal bilaterally  Eyes: EOM are normal.  Neck: Normal range of motion. No thyromegaly present.  Cardiovascular: Normal rate and regular rhythm.  Pulmonary/Chest: Effort normal. No respiratory distress. She has wheezes. She has no rales.  Abdominal: Soft.  Lymphadenopathy:    She has no cervical adenopathy.  Neurological: She is alert and oriented to person, place, and time.  Skin: Skin is warm and dry.   Vitals:   02/01/18 0903  BP: 138/62  Pulse: 94  Temp: 98.2 F (36.8 C)  TempSrc: Oral  SpO2: 99%  Weight: 166 lb (75.3 kg)  Height: 5\' 3"  (1.6 m)      Assessment & Plan:

## 2018-02-01 NOTE — Patient Instructions (Signed)
We have sent in the prednisone take 2 pills daily for 5 days.  Start taking the prednisone and if by Wednesday you are not feeling 40% better start taking the antibiotic. We have sent in doxycycline to take 1 pill twice a day for 7 days.

## 2018-02-01 NOTE — Assessment & Plan Note (Addendum)
Rx for prednisone or doxycycline for this flare. Continue albuterol prn.

## 2018-02-04 ENCOUNTER — Other Ambulatory Visit: Payer: Self-pay | Admitting: Internal Medicine

## 2018-02-05 ENCOUNTER — Other Ambulatory Visit: Payer: Self-pay | Admitting: Family

## 2018-02-08 ENCOUNTER — Other Ambulatory Visit: Payer: Self-pay | Admitting: Internal Medicine

## 2018-02-08 MED ORDER — AMLODIPINE BESYLATE 5 MG PO TABS: 5.0000 mg | ORAL_TABLET | Freq: Every day | ORAL | 2 refills | Status: DC

## 2018-02-08 NOTE — Telephone Encounter (Signed)
Last OV: 07/20/17 addressing this medication; ordered by another provider PCP: Sharlet Salina [id number] [address], [place] [phone] (Phone) [phone] (Fax)

## 2018-02-08 NOTE — Telephone Encounter (Signed)
Copied from [place] [id number]. Topic: Quick Communication - Rx Refill/Question >> Feb 08, 2018  8:30 AM Lolita Rieger, RMA wrote: Medication: amlodipine 5 mg   Has the patient contacted their pharmacy? yes   (Agent: If no, request that the patient contact the pharmacy for the refill.)   Preferred Pharmacy (with phone number or street name):[facility] [address]   Agent: Please be advised that RX refills may take up to 3 business days. We ask that you follow-up with your pharmacy.

## 2018-03-07 ENCOUNTER — Other Ambulatory Visit: Payer: Self-pay | Admitting: Family

## 2018-03-09 ENCOUNTER — Other Ambulatory Visit: Payer: Self-pay | Admitting: Internal Medicine

## 2018-03-10 ENCOUNTER — Ambulatory Visit: Payer: Self-pay | Admitting: *Deleted

## 2018-03-10 ENCOUNTER — Other Ambulatory Visit: Payer: Self-pay | Admitting: Internal Medicine

## 2018-03-10 NOTE — Telephone Encounter (Signed)
Pt   Has   Symptoms  Of  Cough   As  Well  As   Some  Wheezing  Having to  Use  Her  Inhaler  She  Also  Reports   Symptoms   Of  Shortness  Of breath  On     Exertion  As   Well . Pt  Has  Taken   Robitussin  OTC  For the  Symptoms  But  Had  To  Stop  Because  It  Was  Upsetting  Her  Stomach  Appt  Made  An appointment  For tomorrow  With  Clearance Coots    Reason for Disposition . [1] Patient also has allergy symptoms (e.g., itchy eyes, clear nasal discharge, postnasal drip) AND [2] they are acting up  Answer Assessment - Initial Assessment Questions 1. ONSET: "When did the cough begin?"       Off   And   On  For  2   Weeks   2. SEVERITY: "How bad is the cough today?"        Moderate   3. RESPIRATORY DISTRESS: "Describe your breathing."       Pt  Has  Been  Wheezing as   Well    4. FEVER: "Do you have a fever?" If so, ask: "What is your temperature, how was it measured, and when did it start?"      No   5. HEMOPTYSIS: "Are you coughing up any blood?" If so ask: "How much?" (flecks, streaks, tablespoons, etc.)      No 6. TREATMENT: "What have you done so far to treat the cough?" (e.g., meds, fluids, humidifier)        Using  Inhaler frequently  Was  Taking  Robitussin  X  5  Days  As  It  Was  Upsetting   Her  Stomach   7. CARDIAC HISTORY: "Do you have any history of heart disease?" (e.g., heart attack, congestive heart failure)         No   8. LUNG HISTORY: "Do you have any history of lung disease?"  (e.g., pulmonary embolus, asthma, emphysema)      Asthma or bronchitis     9. PE RISK FACTORS: "Do you have a history of blood clots?" (or: recent major surgery, recent prolonged travel, bedridden )     NO 10. OTHER SYMPTOMS: "Do you have any other symptoms? (e.g., runny nose, wheezing, chest pain)       WHEEZING   -  Shortness of  Breath  On  Exertion   11. PREGNANCY: "Is there any chance you are pregnant?" "When was your last menstrual period?"       N/a 12. TRAVEL: "Have you traveled  out of the country in the last month?" (e.g., travel history, exposures)       no  Protocols used: COUGH - ACUTE NON-PRODUCTIVE-A-AH

## 2018-03-11 ENCOUNTER — Encounter: Payer: Self-pay | Admitting: Family Medicine

## 2018-03-11 ENCOUNTER — Ambulatory Visit: Payer: Medicare Other | Admitting: Family Medicine

## 2018-03-11 DIAGNOSIS — R059 Cough, unspecified: Secondary | ICD-10-CM

## 2018-03-11 DIAGNOSIS — R05 Cough: Secondary | ICD-10-CM | POA: Diagnosis not present

## 2018-03-11 MED ORDER — PREDNISONE 20 MG PO TABS: 40.0000 mg | ORAL_TABLET | Freq: Every day | ORAL | 0 refills | Status: DC

## 2018-03-11 NOTE — Assessment & Plan Note (Signed)
No production. Does have wheezing. Reports using albuterol regularly  - prednisone  - may need a controller medication  - given indications to follow up.

## 2018-03-11 NOTE — Patient Instructions (Signed)
Please take the prednisone  Please use the albuterol every 4-6 hours for the next day If you are using the albuterol on a daily basis then you may need a different type of inhaler.  Please follow up with me if your symptoms don't improve.

## 2018-03-11 NOTE — Progress Notes (Signed)
Jamie [name] - 84 y.o. female MRN [id number]  Date of birth: Dec 16, 1932  SUBJECTIVE:  Including CC & ROS.  Chief Complaint  Patient presents with  . Cough    Jamie [name] is a 84 y.o. female that is presenting with a cough. Present for two weeks. Denies productive cough. Admits to shortness of breath. Denies fevers or body aches. She completed a course of doxycyline last month for same symptoms. She has been taking Zyrtec with no improvement.   She was seen on 3/4 with similar symptoms. Was placed on steroids and doxycycline.   Review of Systems  Constitutional: Negative for fever.  HENT: Positive for congestion.   Respiratory: Positive for cough.   Cardiovascular: Negative for chest pain.  Gastrointestinal: Negative for abdominal pain.    HISTORY: Past Medical, Surgical, Social, and Family History Reviewed & Updated per EMR.   Pertinent Historical Findings include:  Past Medical History:  Diagnosis Date  . ANEMIA, IRON DEFICIENCY 09/10/2007  . CATARACT EXTRACTION, HX OF 09/10/2007  . DYSPEPSIA, HX OF 09/10/2007  . HAIR LOSS 03/21/2009  . HAY FEVER 09/10/2007  . HEARING LOSS, SENSORINEURAL 09/10/2007  . HYPERLIPIDEMIA 09/10/2007  . HYPERTENSION 09/10/2007  . LOW BACK PAIN 05/10/2008  . Pain in joint, hand 03/01/2008  . PEPTIC ULCER DISEASE 09/10/2007  . POLYP, COLON 09/10/2007  . POLYPECTOMY, HX OF 09/10/2007  . SHINGLES 08/13/2009  . SHOULDER PAIN, LEFT 02/12/2008  . TAH/BSO, HX OF 09/10/2007    Past Surgical History:  Procedure Laterality Date  . CATARACT EXTRACTION    . IRRIGATION AND DEBRIDEMENT SEBACEOUS CYST    . POLYPECTOMY    . TOTAL ABDOMINAL HYSTERECTOMY W/ BILATERAL SALPINGOOPHORECTOMY      Allergies  Allergen Reactions  . Latex Itching and Rash    Family History  Problem Relation Age of Onset  . Hypertension Mother   . Coronary artery disease Mother   . Heart disease Mother        CHF  . Cancer Neg Hx        breast or colon  .  Diabetes Neg Hx      Social History   Socioeconomic History  . Marital status: Married    Spouse name: Not on file  . Number of children: 4  . Years of education: Not on file  . Highest education level: Not on file  Occupational History  . 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 Financial resource strain: Not on file  . Food insecurity:    Worry: Not on file    Inability: Not on file  . Transportation needs:    Medical: Not on file    Non-medical: Not on file  Tobacco Use  . Smoking status: Former Smoker    Packs/day: 0.20    Years: 25.00    Pack years: 5.00    Types: Cigarettes    Last attempt to quit: 12/01/1989    Years since quitting: 28.2  . Smokeless tobacco: Never Used  . Tobacco comment: smoked a long time ago; Quit 90 or 91/ number is approximate  Substance and Sexual Activity  . Alcohol use: No    Alcohol/week: 0.0 oz  . Drug use: No  . Sexual activity: Not on file  Lifestyle  . Physical activity:    Days per week: Not on file    Minutes per session: Not on file  . Stress: Not on file  Relationships  .  Social connections:    Talks on phone: Not on file    Gets together: Not on file    Attends religious service: Not on file    Active member of club or organization: Not on file    Attends meetings of clubs or organizations: Not on file    Relationship status: Not on file  . Intimate partner violence:    Fear of current or ex partner: Not on file    Emotionally abused: Not on file    Physically abused: Not on file    Forced sexual activity: Not on file  Other Topics Concern  . Not on file  Social History Narrative   3 sons, 54,59, 75 and 1 daughter 58, 4 grandchildren   SO with multiple medical problems     PHYSICAL EXAM:  VS: BP (!) 142/76 (BP Location: Left Arm, Patient Position: Sitting, Cuff Size: Small)   Pulse 72   Temp 98.5 F (36.9 C) (Oral)   Ht 5\' 3"  (1.6 m)   Wt 169 lb (76.7  kg)   SpO2 98%   BMI 29.94 kg/m  Physical Exam Gen: NAD, alert, cooperative with exam,  ENT: normal lips, normal nasal mucosa, tympanic membranes clear and intact bilaterally, normal oropharynx, no cervical lymphadenopathy Eye: normal EOM, normal conjunctiva and lids CV:  no edema, +2 pedal pulses, regular rate and rhythm, S1-S2   Resp: no accessory muscle use, non-labored, end expiratory wheezing.  Skin: no rashes, no areas of induration  Neuro: normal tone, normal sensation to touch Psych:  normal insight, alert and oriented MSK: Normal gait, normal strength       ASSESSMENT & PLAN:   Cough No production. Does have wheezing. Reports using albuterol regularly  - prednisone  - may need a controller medication  - given indications to follow up.

## 2018-03-24 ENCOUNTER — Other Ambulatory Visit: Payer: Self-pay | Admitting: Internal Medicine

## 2018-03-24 ENCOUNTER — Other Ambulatory Visit: Payer: Self-pay | Admitting: Family

## 2018-03-26 ENCOUNTER — Other Ambulatory Visit: Payer: Self-pay | Admitting: Family

## 2018-03-26 ENCOUNTER — Other Ambulatory Visit: Payer: Self-pay | Admitting: Internal Medicine

## 2018-03-26 ENCOUNTER — Telehealth: Payer: Self-pay | Admitting: Internal Medicine

## 2018-03-26 MED ORDER — FUROSEMIDE 40 MG PO TABS: 40.0000 mg | ORAL_TABLET | Freq: Every day | ORAL | 0 refills | Status: DC

## 2018-03-26 NOTE — Telephone Encounter (Signed)
Copied from [place]. Topic: Quick Communication - Rx Refill/Question >> Mar 26, 2018  1:44 PM Ether Griffins B wrote: Medication:furosemide (LASIX) 40 MG tablet   Pt is out of the medication. Pharmacy sent the request to [facility] instead of [facility]   Has the patient contacted their pharmacy? Yes.   (Agent: If no, request that the patient contact the pharmacy for the refill.) Preferred Pharmacy (with phone number or street name): [id number] - [place], [place] Agent: Please be advised that RX refills may take up to 3 business days. We ask that you follow-up with your pharmacy.

## 2018-03-26 NOTE — Telephone Encounter (Signed)
Reviewed chart pt is up-to-date sent refills to [facility].../[facility]  

## 2018-04-02 ENCOUNTER — Other Ambulatory Visit: Payer: Self-pay | Admitting: Family

## 2018-04-02 ENCOUNTER — Telehealth: Payer: Self-pay | Admitting: Internal Medicine

## 2018-04-02 MED ORDER — OLMESARTAN MEDOXOMIL 40 MG PO TABS: 40.0000 mg | ORAL_TABLET | Freq: Every day | ORAL | 1 refills | Status: DC

## 2018-04-02 NOTE — Telephone Encounter (Signed)
Stop the Losartan; I sent in the Olmesartan she is requesting; follow-up with Dr. Sharlet Salina in about 2-3 weeks for blood pressure check.

## 2018-04-02 NOTE — Telephone Encounter (Signed)
Patient informed and stated understanding.

## 2018-04-02 NOTE — Telephone Encounter (Signed)
Copied from [place]. Topic: Quick Communication - See Telephone Encounter >> Apr 02, 2018 10:09 AM Hewitt Shorts wrote: Pt is needing to talk with someone about taking the losartin potassium has upset her stomach she had heartburn nausea and would like to discuss going back to just the  [employer] number [phone]

## 2018-04-02 NOTE — Telephone Encounter (Signed)
Please advise per Dr. Nathanial Millman absence  Called patient back and she states that she would like to go back to the Olmesartan because of the upset stomach, heartburn, and nausea with the losartan. The reason for the change in the first place was due to hair thinning, I asked patient if she was aware of that and states she is still okay with changing back. I advised patient that Dr. Sharlet Salina was out of the office till Wednesday and another provider may want a visit before switching. Patient stated understanding

## 2018-04-09 ENCOUNTER — Encounter: Payer: Self-pay | Admitting: Internal Medicine

## 2018-04-09 ENCOUNTER — Telehealth: Payer: Self-pay | Admitting: Internal Medicine

## 2018-04-09 ENCOUNTER — Ambulatory Visit: Payer: Medicare Other | Admitting: Internal Medicine

## 2018-04-09 ENCOUNTER — Other Ambulatory Visit: Payer: Self-pay | Admitting: Internal Medicine

## 2018-04-09 ENCOUNTER — Other Ambulatory Visit: Payer: Self-pay | Admitting: Family

## 2018-04-09 VITALS — BP 140/60 | HR 84 | Temp 98.5°F | Ht 63.0 in | Wt 170.0 lb

## 2018-04-09 DIAGNOSIS — R0602 Shortness of breath: Secondary | ICD-10-CM

## 2018-04-09 DIAGNOSIS — J4530 Mild persistent asthma, uncomplicated: Secondary | ICD-10-CM | POA: Insufficient documentation

## 2018-04-09 LAB — POCT EXHALED NITRIC OXIDE: FeNO level (ppb): 172

## 2018-04-09 MED ORDER — FLUTICASONE FUROATE-VILANTEROL 100-25 MCG/INH IN AEPB: 1.0000 | INHALATION_SPRAY | Freq: Every day | RESPIRATORY_TRACT | 3 refills | Status: DC

## 2018-04-09 MED ORDER — OLMESARTAN MEDOXOMIL 40 MG PO TABS: 40.0000 mg | ORAL_TABLET | Freq: Every day | ORAL | 3 refills | Status: DC

## 2018-04-09 MED ORDER — PREDNISONE 20 MG PO TABS: 40.0000 mg | ORAL_TABLET | Freq: Every day | ORAL | 0 refills | Status: DC

## 2018-04-09 NOTE — Patient Instructions (Addendum)
We are sending in a medicine called breo to help with the breathing to make it better everyday. Take 1 puff daily of the breo to make your lungs better.   It is okay to use the albuterol still if you cannot breathe.   We are going to give you another 5 days of prednisone to take 2 pills daily to help the breathing.

## 2018-04-09 NOTE — Progress Notes (Signed)
   Subjective:    Patient ID: Jamie [name], female    DOB: [birth year], 84 y.o.   MRN: [id number]  HPI The patient is an 84 YO female coming in for recurrent wheezing and SOB. She was treated with prednisone back in April and this helped her for a couple of weeks. Then she started having more SOB and some wheezing. She is using albuterol about 4 times per day to help the SOB. It does help when she uses it. She denies fevers or chills. She denies much nasal drainage. No prior history of asthma but she does have pollen allergies. Denies allergy symptoms now. Denies GERD.   Review of Systems  Constitutional: Positive for activity change and appetite change. Negative for chills, fatigue, fever and unexpected weight change.  HENT: Negative for congestion, ear discharge, ear pain, postnasal drip, rhinorrhea, sinus pressure, sinus pain, sneezing, sore throat, tinnitus, trouble swallowing and voice change.   Eyes: Negative.   Respiratory: Positive for cough, shortness of breath and wheezing. Negative for chest tightness.   Cardiovascular: Negative.   Gastrointestinal: Negative.   Neurological: Negative.       Objective:   Physical Exam  Constitutional: She is oriented to person, place, and time. She appears well-developed and well-nourished.  HENT:  Head: Normocephalic and atraumatic.  Eyes: EOM are normal.  Neck: Normal range of motion.  Cardiovascular: Normal rate and regular rhythm.  Pulmonary/Chest: Effort normal. No respiratory distress. She has wheezes. She has no rales.  Some SOB with exertion, some wheezing diffuse, good airflow  Abdominal: Soft. Bowel sounds are normal. She exhibits no distension. There is no tenderness. There is no rebound.  Musculoskeletal: She exhibits no edema.  Neurological: She is alert and oriented to person, place, and time. Coordination normal.  Skin: Skin is warm and dry.   Vitals:   04/09/18 0939  BP: 140/60  Pulse: 84  Temp: 98.5 F (36.9 C)    TempSrc: Oral  SpO2: 98%  Weight: 170 lb (77.1 kg)  Height: 5\' 3"  (1.6 m)   FENO: 172    Assessment & Plan:

## 2018-04-09 NOTE — Assessment & Plan Note (Signed)
FENO high which indicates inflammation. Will give course of steroids today. Sample given of breo and rx sent in. Will likely do during allergy season and then recheck feno on treatment to ensure adequate treatment. She is currently using albuterol QID which we would like to decrease. 3 flares so far this year and it is unclear if this is asthma onset or post-viral inflammation.

## 2018-04-09 NOTE — Telephone Encounter (Signed)
Copied from [place] [id number]. Topic: Quick Communication - Rx Refill/Question >> Apr 09, 2018 10:57 AM Yvette Rack wrote: Medication: albuterol (PROVENTIL HFA;VENTOLIN HFA) 108 (90 Base) MCG/ACT inhaler  Preferred Pharmacy (with phone number or street name): [id number] [place], [place] [phone] (Phone) [phone] (Fax)  Agent: Please be advised that RX refills may take up to 3 business days. We ask that you follow-up with your pharmacy.

## 2018-04-09 NOTE — Telephone Encounter (Signed)
Medication filled on 04/09/18

## 2018-04-09 NOTE — Telephone Encounter (Unsigned)
Copied from [place] [id number]. Topic: Quick Communication - Rx Refill/Question >> Apr 09, 2018 11:06 AM Yvette Rack wrote: Medication: albuterol (PROVENTIL HFA;VENTOLIN HFA) 108 (90 Base) MCG/ACT inhaler  Preferred Pharmacy (with phone number or street name): [id number] [place], [place] [phone] (Phone) [phone] (Fax)  Agent: Please be advised that RX refills may take up to 3 business days. We ask that you follow-up with your pharmacy.

## 2018-05-05 ENCOUNTER — Other Ambulatory Visit: Payer: Self-pay | Admitting: Internal Medicine

## 2018-06-02 ENCOUNTER — Ambulatory Visit: Payer: Medicare Other | Admitting: Internal Medicine

## 2018-06-02 ENCOUNTER — Encounter: Payer: Self-pay | Admitting: Internal Medicine

## 2018-06-02 DIAGNOSIS — R21 Rash and other nonspecific skin eruption: Secondary | ICD-10-CM | POA: Diagnosis not present

## 2018-06-02 DIAGNOSIS — J4530 Mild persistent asthma, uncomplicated: Secondary | ICD-10-CM | POA: Diagnosis not present

## 2018-06-02 LAB — POCT EXHALED NITRIC OXIDE: FeNO level (ppb): 56

## 2018-06-02 MED ORDER — TRIAMCINOLONE ACETONIDE 0.1 % EX CREA: 1.0000 "application " | TOPICAL_CREAM | Freq: Two times a day (BID) | CUTANEOUS | 3 refills | Status: DC

## 2018-06-02 NOTE — Progress Notes (Signed)
   Subjective:    Patient ID: Jamie [name], female    DOB: [birth year], 84 y.o.   MRN: [id number]  HPI The patient is an 84 YO female coming in for follow up of her breathing. She was given prednisone course and started on breo at last visit. She is doing better since that time. She has stopped coughing altogether. She is now using albuterol 0-1 times per day. She does still get SOB with heavy exertion. Denies wheezing. She denies cold or allergy symptoms. Overall she is satisfied with the new inhaler. She denies side effects or problems with it.  She is also having new rash which is itching some. She had a cream in the past which helped the itching but she is out. She cannot recall the name. She denies new soap, shampoo, detergent. She did some outdoor work but cannot recall any exposure.   Review of Systems  Constitutional: Negative.   HENT: Negative.   Eyes: Negative.   Respiratory: Positive for shortness of breath. Negative for cough, chest tightness and wheezing.   Cardiovascular: Negative for chest pain, palpitations and leg swelling.  Gastrointestinal: Negative for abdominal distention, abdominal pain, constipation, diarrhea, nausea and vomiting.  Musculoskeletal: Negative.   Skin: Positive for rash.  Neurological: Negative.   Psychiatric/Behavioral: Negative.       Objective:   Physical Exam  Constitutional: She is oriented to person, place, and time. She appears well-developed and well-nourished.  HENT:  Head: Normocephalic and atraumatic.  Eyes: EOM are normal.  Neck: Normal range of motion.  Cardiovascular: Normal rate and regular rhythm.  Pulmonary/Chest: Effort normal and breath sounds normal. No respiratory distress. She has no wheezes. She has no rales.  Lungs sound improved  Abdominal: Soft. Bowel sounds are normal. She exhibits no distension. There is no tenderness. There is no rebound.  Musculoskeletal: She exhibits no edema.  Neurological: She is alert and  oriented to person, place, and time. Coordination normal.  Skin: Skin is warm and dry.  Some stigmata of scratching on the left forearm  Psychiatric: She has a normal mood and affect.   Vitals:   06/02/18 0836  BP: (!) 138/58  Pulse: 85  Temp: 98.5 F (36.9 C)  TempSrc: Oral  SpO2: 99%  Weight: 175 lb (79.4 kg)  Height: 5\' 3"  (1.6 m)      Assessment & Plan:

## 2018-06-02 NOTE — Assessment & Plan Note (Signed)
Recheck FENO today which was 59 and was started on breo at last visit. She is clinically improved. Still using albuterol inhaler some. Her FENO is still high but much improved from prior 172. Will observe for 3 months and recheck.

## 2018-06-02 NOTE — Patient Instructions (Signed)
We will keep you on the breo daily and you can use albuterol as needed.   These exercises might help with the back pain.    Back Exercises If you have pain in your back, do these exercises 2-3 times each day or as told by your doctor. When the pain goes away, do the exercises once each day, but repeat the steps more times for each exercise (do more repetitions). If you do not have pain in your back, do these exercises once each day or as told by your doctor. Exercises Single Knee to Chest  Do these steps 3-5 times in a row for each leg: 1. Lie on your back on a firm bed or the floor with your legs stretched out. 2. Bring one knee to your chest. 3. Hold your knee to your chest by grabbing your knee or thigh. 4. Pull on your knee until you feel a gentle stretch in your lower back. 5. Keep doing the stretch for 10-30 seconds. 6. Slowly let go of your leg and straighten it.  Pelvic Tilt  Do these steps 5-10 times in a row: 1. Lie on your back on a firm bed or the floor with your legs stretched out. 2. Bend your knees so they point up to the ceiling. Your feet should be flat on the floor. 3. Tighten your lower belly (abdomen) muscles to press your lower back against the floor. This will make your tailbone point up to the ceiling instead of pointing down to your feet or the floor. 4. Stay in this position for 5-10 seconds while you gently tighten your muscles and breathe evenly.  Cat-Cow  Do these steps until your lower back bends more easily: 1. Get on your hands and knees on a firm surface. Keep your hands under your shoulders, and keep your knees under your hips. You may put padding under your knees. 2. Let your head hang down, and make your tailbone point down to the floor so your lower back is round like the back of a cat. 3. Stay in this position for 5 seconds. 4. Slowly lift your head and make your tailbone point up to the ceiling so your back hangs low (sags) like the back of a  cow. 5. Stay in this position for 5 seconds.  Press-Ups  Do these steps 5-10 times in a row: 1. Lie on your belly (face-down) on the floor. 2. Place your hands near your head, about shoulder-width apart. 3. While you keep your back relaxed and keep your hips on the floor, slowly straighten your arms to raise the top half of your body and lift your shoulders. Do not use your back muscles. To make yourself more comfortable, you may change where you place your hands. 4. Stay in this position for 5 seconds. 5. Slowly return to lying flat on the floor.  Bridges  Do these steps 10 times in a row: 1. Lie on your back on a firm surface. 2. Bend your knees so they point up to the ceiling. Your feet should be flat on the floor. 3. Tighten your butt muscles and lift your butt off of the floor until your waist is almost as high as your knees. If you do not feel the muscles working in your butt and the back of your thighs, slide your feet 1-2 inches farther away from your butt. 4. Stay in this position for 3-5 seconds. 5. Slowly lower your butt to the floor, and let your butt  muscles relax.  If this exercise is too easy, try doing it with your arms crossed over your chest. Belly Crunches  Do these steps 5-10 times in a row: 1. Lie on your back on a firm bed or the floor with your legs stretched out. 2. Bend your knees so they point up to the ceiling. Your feet should be flat on the floor. 3. Cross your arms over your chest. 4. Tip your chin a little bit toward your chest but do not bend your neck. 5. Tighten your belly muscles and slowly raise your chest just enough to lift your shoulder blades a tiny bit off of the floor. 6. Slowly lower your chest and your head to the floor.  Back Lifts Do these steps 5-10 times in a row: 1. Lie on your belly (face-down) with your arms at your sides, and rest your forehead on the floor. 2. Tighten the muscles in your legs and your butt. 3. Slowly lift your  chest off of the floor while you keep your hips on the floor. Keep the back of your head in line with the curve in your back. Look at the floor while you do this. 4. Stay in this position for 3-5 seconds. 5. Slowly lower your chest and your face to the floor.  Contact a doctor if:  Your back pain gets a lot worse when you do an exercise.  Your back pain does not lessen 2 hours after you exercise. If you have any of these problems, stop doing the exercises. Do not do them again unless your doctor says it is okay. Get help right away if:  You have sudden, very bad back pain. If this happens, stop doing the exercises. Do not do them again unless your doctor says it is okay. This information is not intended to replace advice given to you by your health care provider. Make sure you discuss any questions you have with your health care provider. Document Released: 12/20/2010 Document Revised: 04/24/2016 Document Reviewed: 01/11/2015 Elsevier Interactive Patient Education  [employer].

## 2018-06-04 DIAGNOSIS — R21 Rash and other nonspecific skin eruption: Secondary | ICD-10-CM | POA: Insufficient documentation

## 2018-06-04 NOTE — Assessment & Plan Note (Signed)
Rx for triamcinolone cream for her rash and itching.

## 2018-06-09 ENCOUNTER — Other Ambulatory Visit: Payer: Self-pay | Admitting: Internal Medicine

## 2018-06-11 ENCOUNTER — Telehealth: Payer: Self-pay | Admitting: Internal Medicine

## 2018-06-11 MED ORDER — OLMESARTAN MEDOXOMIL 40 MG PO TABS: 40.0000 mg | ORAL_TABLET | Freq: Every day | ORAL | 3 refills | Status: DC

## 2018-06-11 NOTE — Telephone Encounter (Signed)
Telephone request for refill of Benicar.  LOV:  04/09/18 with Dr. Sharlet Salina.      Last refill:  04/09/18    Pharmacy:  [facility]

## 2018-06-11 NOTE — Telephone Encounter (Unsigned)
Copied from [place] [phone] [phone]. Topic: Quick Communication - Rx Refill/Question >> Jun 11, 2018 12:42 PM Neva Seat wrote: olmesartan (BENICAR) 40 MG tablet  Needing refills  [facility]: Address: [address], [place], [place] [address] Phone: [phone]

## 2018-06-23 ENCOUNTER — Other Ambulatory Visit: Payer: Self-pay | Admitting: Internal Medicine

## 2018-07-16 ENCOUNTER — Other Ambulatory Visit: Payer: Self-pay | Admitting: Internal Medicine

## 2018-07-16 DIAGNOSIS — E782 Mixed hyperlipidemia: Secondary | ICD-10-CM

## 2018-07-27 ENCOUNTER — Other Ambulatory Visit: Payer: Self-pay | Admitting: Internal Medicine

## 2018-07-27 DIAGNOSIS — F419 Anxiety disorder, unspecified: Secondary | ICD-10-CM

## 2018-07-27 NOTE — Telephone Encounter (Signed)
Control database checked last refill: 08/21/2017 LOV: 06/02/2018 acute NOV: none

## 2018-07-31 ENCOUNTER — Encounter: Payer: Self-pay | Admitting: Family Medicine

## 2018-07-31 ENCOUNTER — Ambulatory Visit: Payer: Medicare Other | Admitting: Family Medicine

## 2018-07-31 VITALS — BP 138/66 | HR 81 | Temp 98.1°F | Ht 63.0 in | Wt 173.0 lb

## 2018-07-31 DIAGNOSIS — K219 Gastro-esophageal reflux disease without esophagitis: Secondary | ICD-10-CM | POA: Diagnosis not present

## 2018-07-31 MED ORDER — PANTOPRAZOLE SODIUM 20 MG PO TBEC: 20.0000 mg | DELAYED_RELEASE_TABLET | Freq: Every day | ORAL | 0 refills | Status: DC

## 2018-07-31 NOTE — Progress Notes (Signed)
Subjective:    Patient ID: Jamie [name], female    DOB: 04/18/1933, 84 y.o.   MRN: [id number]  Chief Complaint  Patient presents with  . Abdominal Pain    HPI Patient was seen today for concern.  Pt endorses bloating, belching, pain after eating x2 weeks.  Pt states the pain in her stomach is worse with eating tomatoes and orange juice.  In the past patient has had issues with acid reflux and peptic ulcers.  Pt has tried Mylanta and rimantadine for her symptoms.  Pt denies aspirin or ibuprofen use.  She may take 2 Tylenol as needed per day.  Pt denies blood in stools.  Patient has fairly regular BMs.  Past Medical History:  Diagnosis Date  . ANEMIA, IRON DEFICIENCY 09/10/2007  . CATARACT EXTRACTION, HX OF 09/10/2007  . DYSPEPSIA, HX OF 09/10/2007  . HAIR LOSS 03/21/2009  . HAY FEVER 09/10/2007  . HEARING LOSS, SENSORINEURAL 09/10/2007  . HYPERLIPIDEMIA 09/10/2007  . HYPERTENSION 09/10/2007  . LOW BACK PAIN 05/10/2008  . Pain in joint, hand 03/01/2008  . PEPTIC ULCER DISEASE 09/10/2007  . POLYP, COLON 09/10/2007  . POLYPECTOMY, HX OF 09/10/2007  . SHINGLES 08/13/2009  . SHOULDER PAIN, LEFT 02/12/2008  . TAH/BSO, HX OF 09/10/2007    Allergies  Allergen Reactions  . Latex Itching and Rash    ROS General: Denies fever, chills, night sweats, changes in weight, changes in appetite HEENT: Denies headaches, ear pain, changes in vision, rhinorrhea, sore throat CV: Denies CP, palpitations, SOB, orthopnea Pulm: Denies SOB, cough, wheezing GI: Denies abdominal pain, nausea, vomiting, diarrhea, constipation  + GERD, belching, bloating, epigastric pain GU: Denies dysuria, hematuria, frequency, vaginal discharge Msk: Denies muscle cramps, joint pains Neuro: Denies weakness, numbness, tingling Skin: Denies rashes, bruising Psych: Denies depression, anxiety, hallucinations     Objective:    Blood pressure 138/66, pulse 81, temperature 98.1 F (36.7 C), temperature source Oral,  height 5\' 3"  (1.6 m), weight 173 lb (78.5 kg), SpO2 99 %.   Gen. Pleasant, well-nourished, in no distress, normal affect  HEENT: [place]/AT, face symmetric, conjunctiva clear, no scleral icterus, PERRLA, nares patent without drainage, pharynx without erythema or exudate Lungs: no accessory muscle use, CTAB, no wheezes or rales Cardiovascular: RRR, no m/r/g, no peripheral edema Abdomen: BS present, soft, NT/ND, no hepatosplenomegaly. Neuro:  A&Ox3, CN II-XII intact, normal gait   Wt Readings from Last 3 Encounters:  07/31/18 173 lb (78.5 kg)  06/02/18 175 lb (79.4 kg)  04/09/18 170 lb (77.1 kg)    Lab Results  Component Value Date   WBC 7.1 07/16/2017   HGB 12.5 07/16/2017   HCT 38.0 07/16/2017   PLT 173 07/16/2017   GLUCOSE 119 (H) 09/25/2017   CHOL 139 08/15/2016   TRIG 58.0 08/15/2016   HDL 46.20 08/15/2016   LDLCALC 81 08/15/2016   ALT 12 09/25/2017   AST 13 09/25/2017   NA 142 09/25/2017   K 3.2 (L) 09/25/2017   CL 106 09/25/2017   CREATININE 0.76 09/25/2017   BUN 10 09/25/2017   CO2 28 09/25/2017   TSH 1.12 04/28/2012    Assessment/Plan:  Gastroesophageal reflux disease, esophagitis presence not specified  -Discussed avoiding foods known to cause symptoms -We will start Protonix -Patient encouraged to follow-up with PCP/GI given history of gastric ulcer -avoid NSAIDs - Plan: pantoprazole (PROTONIX) 20 MG tablet  Grier Mitts, MD

## 2018-07-31 NOTE — Patient Instructions (Signed)
Food Choices for Gastroesophageal Reflux Disease, Adult When you have gastroesophageal reflux disease (GERD), the foods you eat and your eating habits are very important. Choosing the right foods can help ease the discomfort of GERD. Consider working with a diet and nutrition specialist (dietitian) to help you make healthy food choices. What general guidelines should I follow? Eating plan  Choose healthy foods low in fat, such as fruits, vegetables, whole grains, low-fat dairy products, and lean meat, fish, and poultry.  Eat frequent, small meals instead of three large meals each day. Eat your meals slowly, in a relaxed setting. Avoid bending over or lying down until 2-3 hours after eating.  Limit high-fat foods such as fatty meats or fried foods.  Limit your intake of oils, butter, and shortening to less than 8 teaspoons each day.  Avoid the following: ? Foods that cause symptoms. These may be different for different people. Keep a food diary to keep track of foods that cause symptoms. ? Alcohol. ? Drinking large amounts of liquid with meals. ? Eating meals during the 2-3 hours before bed.  Cook foods using methods other than frying. This may include baking, grilling, or broiling. Lifestyle   Maintain a healthy weight. Ask your health care provider what weight is healthy for you. If you need to lose weight, work with your health care provider to do so safely.  Exercise for at least 30 minutes on 5 or more days each week, or as told by your health care provider.  Avoid wearing clothes that fit tightly around your waist and chest.  Do not use any products that contain nicotine or tobacco, such as cigarettes and e-cigarettes. If you need help quitting, ask your health care provider.  Sleep with the head of your bed raised. Use a wedge under the mattress or blocks under the bed frame to raise the head of the bed. What foods are not recommended? The items listed may not be a complete  list. Talk with your dietitian about what dietary choices are best for you. Grains Pastries or quick breads with added fat. French toast. Vegetables Deep fried vegetables. French fries. Any vegetables prepared with added fat. Any vegetables that cause symptoms. For some people this may include tomatoes and tomato products, chili peppers, onions and garlic, and horseradish. Fruits Any fruits prepared with added fat. Any fruits that cause symptoms. For some people this may include citrus fruits, such as oranges, grapefruit, pineapple, and lemons. Meats and other protein foods High-fat meats, such as fatty beef or pork, hot dogs, ribs, ham, sausage, salami and bacon. Fried meat or protein, including fried fish and fried chicken. Nuts and nut butters. Dairy Whole milk and chocolate milk. Sour cream. Cream. Ice cream. Cream cheese. Milk shakes. Beverages Coffee and tea, with or without caffeine. Carbonated beverages. Sodas. Energy drinks. Fruit juice made with acidic fruits (such as orange or grapefruit). Tomato juice. Alcoholic drinks. Fats and oils Butter. Margarine. Shortening. Ghee. Sweets and desserts Chocolate and cocoa. Donuts. Seasoning and other foods Pepper. Peppermint and spearmint. Any condiments, herbs, or seasonings that cause symptoms. For some people, this may include curry, hot sauce, or vinegar-based salad dressings. Summary  When you have gastroesophageal reflux disease (GERD), food and lifestyle choices are very important to help ease the discomfort of GERD.  Eat frequent, small meals instead of three large meals each day. Eat your meals slowly, in a relaxed setting. Avoid bending over or lying down until 2-3 hours after eating.  Limit high-fat   foods such as fatty meat or fried foods. This information is not intended to replace advice given to you by your health care provider. Make sure you discuss any questions you have with your health care provider. Document Released:  11/17/2005 Document Revised: 11/18/2016 Document Reviewed: 11/18/2016 Elsevier Interactive Patient Education  2018 [employer]  

## 2018-08-10 ENCOUNTER — Telehealth: Payer: Self-pay | Admitting: Internal Medicine

## 2018-08-18 ENCOUNTER — Other Ambulatory Visit: Payer: Self-pay | Admitting: Internal Medicine

## 2018-08-18 NOTE — Telephone Encounter (Signed)
Pt would like to know why this Rx was refused  fluticasone furoate-vilanterol (BREO ELLIPTA) 100-25 MCG/INH AEPB  [id number] [address], [place] (Phone) [phone] (Fax)

## 2018-08-18 NOTE — Telephone Encounter (Signed)
Pt informed request was asked too soon.

## 2018-08-19 ENCOUNTER — Other Ambulatory Visit: Payer: Self-pay | Admitting: Internal Medicine

## 2018-08-19 NOTE — Telephone Encounter (Signed)
Just sent this in today

## 2018-08-19 NOTE — Telephone Encounter (Signed)
Pt called regarding this medication she is out,   Patient stated her box said 60 each NOT 90 each, I called the pharmacy and they dispensed it for 60 EACH because that is the only option there is no 90 each option for QT, if you want it dispensed for 3 months the QT has to be 480 each..   Pt is out of med please advise on refill [facility]  Please call patient once sent in.

## 2018-08-22 ENCOUNTER — Other Ambulatory Visit: Payer: Self-pay | Admitting: Family Medicine

## 2018-08-22 DIAGNOSIS — K219 Gastro-esophageal reflux disease without esophagitis: Secondary | ICD-10-CM

## 2018-08-23 ENCOUNTER — Other Ambulatory Visit: Payer: Self-pay | Admitting: Internal Medicine

## 2018-08-23 DIAGNOSIS — E782 Mixed hyperlipidemia: Secondary | ICD-10-CM

## 2018-08-24 NOTE — Telephone Encounter (Signed)
Pt sees Dr Crawford, please Advise 

## 2018-09-01 ENCOUNTER — Other Ambulatory Visit (INDEPENDENT_AMBULATORY_CARE_PROVIDER_SITE_OTHER): Payer: Medicare Other

## 2018-09-01 ENCOUNTER — Ambulatory Visit: Payer: Medicare Other | Admitting: Internal Medicine

## 2018-09-01 ENCOUNTER — Encounter: Payer: Self-pay | Admitting: Internal Medicine

## 2018-09-01 VITALS — BP 150/70 | HR 84 | Temp 98.6°F | Ht 63.0 in | Wt 172.0 lb

## 2018-09-01 DIAGNOSIS — Z0000 Encounter for general adult medical examination without abnormal findings: Secondary | ICD-10-CM

## 2018-09-01 DIAGNOSIS — E782 Mixed hyperlipidemia: Secondary | ICD-10-CM

## 2018-09-01 DIAGNOSIS — F419 Anxiety disorder, unspecified: Secondary | ICD-10-CM

## 2018-09-01 DIAGNOSIS — K219 Gastro-esophageal reflux disease without esophagitis: Secondary | ICD-10-CM | POA: Diagnosis not present

## 2018-09-01 DIAGNOSIS — Z23 Encounter for immunization: Secondary | ICD-10-CM

## 2018-09-01 DIAGNOSIS — J4530 Mild persistent asthma, uncomplicated: Secondary | ICD-10-CM

## 2018-09-01 DIAGNOSIS — I10 Essential (primary) hypertension: Secondary | ICD-10-CM

## 2018-09-01 LAB — LIPID PANEL
Cholesterol: 142 mg/dL (ref 0–200)
HDL: 47.2 mg/dL (ref >39.00)
LDL CALC: 83 mg/dL (ref 0–99)
NonHDL: 94.61
TRIGLYCERIDES: 57 mg/dL (ref 0.0–149.0)
Total CHOL/HDL Ratio: 3
VLDL: 11.4 mg/dL (ref 0.0–40.0)

## 2018-09-01 LAB — COMPREHENSIVE METABOLIC PANEL
ALBUMIN: 4.1 g/dL (ref 3.5–5.2)
ALT: 9 U/L (ref 0–35)
AST: 15 U/L (ref 0–37)
Alkaline Phosphatase: 178 U/L — ABNORMAL HIGH (ref 39–117)
BUN: 11 mg/dL (ref 6–23)
CO2: 27 mEq/L (ref 19–32)
CREATININE: 0.88 mg/dL (ref 0.40–1.20)
Calcium: 9.3 mg/dL (ref 8.4–10.5)
Chloride: 107 mEq/L (ref 96–112)
GFR: 78.82 mL/min (ref >60.00)
GLUCOSE: 92 mg/dL (ref 70–99)
POTASSIUM: 3.5 meq/L (ref 3.5–5.1)
SODIUM: 142 meq/L (ref 135–145)
Total Bilirubin: 0.7 mg/dL (ref 0.2–1.2)
Total Protein: 7.4 g/dL (ref 6.0–8.3)

## 2018-09-01 LAB — CBC
HEMATOCRIT: 36.4 % (ref 36.0–46.0)
Hemoglobin: 12.4 g/dL (ref 12.0–15.0)
MCHC: 34.1 g/dL (ref 30.0–36.0)
MCV: 92.5 fl (ref 78.0–100.0)
PLATELETS: 211 10*3/uL (ref 150.0–400.0)
RBC: 3.93 Mil/uL (ref 3.87–5.11)
RDW: 13.8 % (ref 11.5–15.5)
WBC: 5.3 10*3/uL (ref 4.0–10.5)

## 2018-09-01 LAB — BRAIN NATRIURETIC PEPTIDE: PRO B NATRI PEPTIDE: 22 pg/mL (ref 0.0–100.0)

## 2018-09-01 LAB — HEMOGLOBIN A1C: Hgb A1c MFr Bld: 5.5 % (ref 4.6–6.5)

## 2018-09-01 NOTE — Patient Instructions (Signed)
We are checking the labs today and will call you back about the results.   Think about drinking more water and getting more exercise or walking.   Health Maintenance, Female Adopting a healthy lifestyle and getting preventive care can go a long way to promote health and wellness. Talk with your health care provider about what schedule of regular examinations is right for you. This is a good chance for you to check in with your provider about disease prevention and staying healthy. In between checkups, there are plenty of things you can do on your own. Experts have done a lot of research about which lifestyle changes and preventive measures are most likely to keep you healthy. Ask your health care provider for more information. Weight and diet Eat a healthy diet  Be sure to include plenty of vegetables, fruits, low-fat dairy products, and lean protein.  Do not eat a lot of foods high in solid fats, added sugars, or salt.  Get regular exercise. This is one of the most important things you can do for your health. ? Most adults should exercise for at least 150 minutes each week. The exercise should increase your heart rate and make you sweat (moderate-intensity exercise). ? Most adults should also do strengthening exercises at least twice a week. This is in addition to the moderate-intensity exercise.  Maintain a healthy weight  Body mass index (BMI) is a measurement that can be used to identify possible weight problems. It estimates body fat based on height and weight. Your health care provider can help determine your BMI and help you achieve or maintain a healthy weight.  For females 47 years of age and older: ? A BMI below 18.5 is considered underweight. ? A BMI of 18.5 to 24.9 is normal. ? A BMI of 25 to 29.9 is considered overweight. ? A BMI of 30 and above is considered obese.  Watch levels of cholesterol and blood lipids  You should start having your blood tested for lipids and  cholesterol at 82 years of age, then have this test every 5 years.  You may need to have your cholesterol levels checked more often if: ? Your lipid or cholesterol levels are high. ? You are older than 82 years of age. ? You are at high risk for heart disease.  Cancer screening Lung Cancer  Lung cancer screening is recommended for adults 63-71 years old who are at high risk for lung cancer because of a history of smoking.  A yearly low-dose CT scan of the lungs is recommended for people who: ? Currently smoke. ? Have quit within the past 15 years. ? Have at least a 30-pack-year history of smoking. A pack year is smoking an average of one pack of cigarettes a day for 1 year.  Yearly screening should continue until it has been 15 years since you quit.  Yearly screening should stop if you develop a health problem that would prevent you from having lung cancer treatment.  Breast Cancer  Practice breast self-awareness. This means understanding how your breasts normally appear and feel.  It also means doing regular breast self-exams. Let your health care provider know about any changes, no matter how small.  If you are in your 20s or 30s, you should have a clinical breast exam (CBE) by a health care provider every 1-3 years as part of a regular health exam.  If you are 5 or older, have a CBE every year. Also consider having a breast X-ray (  mammogram) every year.  If you have a family history of breast cancer, talk to your health care provider about genetic screening.  If you are at high risk for breast cancer, talk to your health care provider about having an MRI and a mammogram every year.  Breast cancer gene (BRCA) assessment is recommended for women who have family members with BRCA-related cancers. BRCA-related cancers include: ? Breast. ? Ovarian. ? Tubal. ? Peritoneal cancers.  Results of the assessment will determine the need for genetic counseling and BRCA1 and BRCA2  testing.  Cervical Cancer Your health care provider may recommend that you be screened regularly for cancer of the pelvic organs (ovaries, uterus, and vagina). This screening involves a pelvic examination, including checking for microscopic changes to the surface of your cervix (Pap test). You may be encouraged to have this screening done every 3 years, beginning at age 63.  For women ages 32-65, health care providers may recommend pelvic exams and Pap testing every 3 years, or they may recommend the Pap and pelvic exam, combined with testing for human papilloma virus (HPV), every 5 years. Some types of HPV increase your risk of cervical cancer. Testing for HPV may also be done on women of any age with unclear Pap test results.  Other health care providers may not recommend any screening for nonpregnant women who are considered low risk for pelvic cancer and who do not have symptoms. Ask your health care provider if a screening pelvic exam is right for you.  If you have had past treatment for cervical cancer or a condition that could lead to cancer, you need Pap tests and screening for cancer for at least 20 years after your treatment. If Pap tests have been discontinued, your risk factors (such as having a new sexual partner) need to be reassessed to determine if screening should resume. Some women have medical problems that increase the chance of getting cervical cancer. In these cases, your health care provider may recommend more frequent screening and Pap tests.  Colorectal Cancer  This type of cancer can be detected and often prevented.  Routine colorectal cancer screening usually begins at 82 years of age and continues through 83 years of age.  Your health care provider may recommend screening at an earlier age if you have risk factors for colon cancer.  Your health care provider may also recommend using home test kits to check for hidden blood in the stool.  A small camera at the end of a  tube can be used to examine your colon directly (sigmoidoscopy or colonoscopy). This is done to check for the earliest forms of colorectal cancer.  Routine screening usually begins at age 80.  Direct examination of the colon should be repeated every 5-10 years through 82 years of age. However, you may need to be screened more often if early forms of precancerous polyps or small growths are found.  Skin Cancer  Check your skin from head to toe regularly.  Tell your health care provider about any new moles or changes in moles, especially if there is a change in a mole's shape or color.  Also tell your health care provider if you have a mole that is larger than the size of a pencil eraser.  Always use sunscreen. Apply sunscreen liberally and repeatedly throughout the day.  Protect yourself by wearing long sleeves, pants, a wide-brimmed hat, and sunglasses whenever you are outside.  Heart disease, diabetes, and high blood pressure  High blood pressure  causes heart disease and increases the risk of stroke. High blood pressure is more likely to develop in: ? People who have blood pressure in the high end of the normal range (130-139/85-89 mm Hg). ? People who are overweight or obese. ? People who are African American.  If you are 84-42 years of age, have your blood pressure checked every 3-5 years. If you are 50 years of age or older, have your blood pressure checked every year. You should have your blood pressure measured twice-once when you are at a hospital or clinic, and once when you are not at a hospital or clinic. Record the average of the two measurements. To check your blood pressure when you are not at a hospital or clinic, you can use: ? An automated blood pressure machine at a pharmacy. ? A home blood pressure monitor.  If you are between 7 years and 63 years old, ask your health care provider if you should take aspirin to prevent strokes.  Have regular diabetes screenings. This  involves taking a blood sample to check your fasting blood sugar level. ? If you are at a normal weight and have a low risk for diabetes, have this test once every three years after 82 years of age. ? If you are overweight and have a high risk for diabetes, consider being tested at a younger age or more often. Preventing infection Hepatitis B  If you have a higher risk for hepatitis B, you should be screened for this virus. You are considered at high risk for hepatitis B if: ? You were born in a country where hepatitis B is common. Ask your health care provider which countries are considered high risk. ? Your parents were born in a high-risk country, and you have not been immunized against hepatitis B (hepatitis B vaccine). ? You have HIV or AIDS. ? You use needles to inject street drugs. ? You live with someone who has hepatitis B. ? You have had sex with someone who has hepatitis B. ? You get hemodialysis treatment. ? You take certain medicines for conditions, including cancer, organ transplantation, and autoimmune conditions.  Hepatitis C  Blood testing is recommended for: ? Everyone born from [birth year] through 1963. ? Anyone with known risk factors for hepatitis C.  Sexually transmitted infections (STIs)  You should be screened for sexually transmitted infections (STIs) including gonorrhea and chlamydia if: ? You are sexually active and are younger than 82 years of age. ? You are older than 82 years of age and your health care provider tells you that you are at risk for this type of infection. ? Your sexual activity has changed since you were last screened and you are at an increased risk for chlamydia or gonorrhea. Ask your health care provider if you are at risk.  If you do not have HIV, but are at risk, it may be recommended that you take a prescription medicine daily to prevent HIV infection. This is called pre-exposure prophylaxis (PrEP). You are considered at risk if: ? You are  sexually active and do not regularly use condoms or know the HIV status of your partner(s). ? You take drugs by injection. ? You are sexually active with a partner who has HIV.  Talk with your health care provider about whether you are at high risk of being infected with HIV. If you choose to begin PrEP, you should first be tested for HIV. You should then be tested every 3 months for as long  as you are taking PrEP. Pregnancy  If you are premenopausal and you may become pregnant, ask your health care provider about preconception counseling.  If you may become pregnant, take 400 to 800 micrograms (mcg) of folic acid every day.  If you want to prevent pregnancy, talk to your health care provider about birth control (contraception). Osteoporosis and menopause  Osteoporosis is a disease in which the bones lose minerals and strength with aging. This can result in serious bone fractures. Your risk for osteoporosis can be identified using a bone density scan.  If you are 32 years of age or older, or if you are at risk for osteoporosis and fractures, ask your health care provider if you should be screened.  Ask your health care provider whether you should take a calcium or vitamin D supplement to lower your risk for osteoporosis.  Menopause may have certain physical symptoms and risks.  Hormone replacement therapy may reduce some of these symptoms and risks. Talk to your health care provider about whether hormone replacement therapy is right for you. Follow these instructions at home:  Schedule regular health, dental, and eye exams.  Stay current with your immunizations.  Do not use any tobacco products including cigarettes, chewing tobacco, or electronic cigarettes.  If you are pregnant, do not drink alcohol.  If you are breastfeeding, limit how much and how often you drink alcohol.  Limit alcohol intake to no more than 1 drink per day for nonpregnant women. One drink equals 12 ounces of  beer, 5 ounces of wine, or 1 ounces of hard liquor.  Do not use street drugs.  Do not share needles.  Ask your health care provider for help if you need support or information about quitting drugs.  Tell your health care provider if you often feel depressed.  Tell your health care provider if you have ever been abused or do not feel safe at home. This information is not intended to replace advice given to you by your health care provider. Make sure you discuss any questions you have with your health care provider. Document Released: 06/02/2011 Document Revised: 04/24/2016 Document Reviewed: 08/21/2015 Elsevier Interactive Patient Education  [employer].

## 2018-09-01 NOTE — Progress Notes (Signed)
   Subjective:    Patient ID: Jamie [name], female    DOB: 1933-11-06, 84 y.o.   MRN: [id number]  HPI The patient is an 84 YO female coming in for physical.   PMH, [facility], social history reviewed and updated.   Review of Systems  Constitutional: Negative.   HENT: Negative.   Eyes: Negative.   Respiratory: Negative for cough, chest tightness and shortness of breath.   Cardiovascular: Negative for chest pain, palpitations and leg swelling.  Gastrointestinal: Negative for abdominal distention, abdominal pain, constipation, diarrhea, nausea and vomiting.  Musculoskeletal: Negative.   Skin: Negative.   Neurological: Negative.   Psychiatric/Behavioral: Negative.       Objective:   Physical Exam  Constitutional: She is oriented to person, place, and time. She appears well-developed and well-nourished.  HENT:  Head: Normocephalic and atraumatic.  Eyes: EOM are normal.  Neck: Normal range of motion.  Cardiovascular: Normal rate and regular rhythm.  Pulmonary/Chest: Effort normal and breath sounds normal. No respiratory distress. She has no wheezes. She has no rales.  Abdominal: Soft. Bowel sounds are normal. She exhibits no distension. There is no tenderness. There is no rebound.  Musculoskeletal: She exhibits no edema.  Neurological: She is alert and oriented to person, place, and time. Coordination normal.  Skin: Skin is warm and dry.  Psychiatric: She has a normal mood and affect.   Vitals:   09/01/18 0758  BP: (!) 150/70  Pulse: 84  Temp: 98.6 F (37 C)  TempSrc: Oral  SpO2: 97%  Weight: 172 lb (78 kg)  Height: 5\' 3"  (1.6 m)      Assessment & Plan:  Flu shot given at visit

## 2018-09-01 NOTE — Assessment & Plan Note (Signed)
Was controlled on zantac but recent flare and she has taken pantoprazole for 1 month and will try to resume zantac only now.

## 2018-09-01 NOTE — Assessment & Plan Note (Signed)
Flu shot given at visit. Pneumonia up to date. Shingrix counseled. Tetanus up to date. Colonoscopy aged out. Mammogram aged out, pap smear aged out and dexa up to date. Counseled about sun safety and mole surveillance. Counseled about the dangers of distracted driving. Given 10 year screening recommendations.

## 2018-09-01 NOTE — Assessment & Plan Note (Signed)
Checking lipid panel and adjust lipitor 20 mg daily as needed. 

## 2018-09-01 NOTE — Assessment & Plan Note (Signed)
Using breo and albuterol as needed. No flare today.

## 2018-09-01 NOTE — Assessment & Plan Note (Signed)
Stable, rare xanax usage. Refill when needed.

## 2018-09-01 NOTE — Assessment & Plan Note (Signed)
BP at goal on her amlodipine and lasix and olmesartan. Checking CMP and adjust as needed. EKG up to date.

## 2018-09-08 ENCOUNTER — Other Ambulatory Visit: Payer: Self-pay | Admitting: Internal Medicine

## 2018-09-08 ENCOUNTER — Telehealth: Payer: Self-pay | Admitting: Internal Medicine

## 2018-09-08 NOTE — Telephone Encounter (Signed)
Copied from [place] [id number]. Topic: Quick Communication - See Telephone Encounter >> Sep 08, 2018  1:22 PM Sheran Luz wrote: CRM for notification. See Telephone encounter for: 09/08/18.  Pt called stating that she was contacted by pharmacy and advised that the medication ranitidine (ZANTAC) 150 MG tablet has been recalled. Pt would like to know if she should continue taking the medication or if Dr. Sharlet Salina can send something else to pharmacy. Please advise.

## 2018-09-10 NOTE — Telephone Encounter (Signed)
Patient informed of MD response and stated understanding  

## 2018-09-10 NOTE — Telephone Encounter (Signed)
She can switch to pepcid over the counter 20 mg daily.

## 2018-09-17 ENCOUNTER — Other Ambulatory Visit: Payer: Self-pay | Admitting: Internal Medicine

## 2018-09-17 DIAGNOSIS — E782 Mixed hyperlipidemia: Secondary | ICD-10-CM

## 2018-10-08 ENCOUNTER — Other Ambulatory Visit: Payer: Self-pay | Admitting: Family Medicine

## 2018-10-08 DIAGNOSIS — K219 Gastro-esophageal reflux disease without esophagitis: Secondary | ICD-10-CM

## 2018-10-08 NOTE — Telephone Encounter (Signed)
Copied from [place] [id number]. Topic: Quick Communication - Rx Refill/Question >> Oct 08, 2018  2:24 PM Wynetta Emery, [place] C wrote: Medication: pantoprazole (PROTONIX) 20 MG tablet   Has the patient contacted their pharmacy? No  (Agent: If no, request that the patient contact the pharmacy for the refill.) (Agent: If yes, when and what did the pharmacy advise?)  Preferred Pharmacy (with phone number or street name): [id number] [address], [place] [phone] (Phone) [phone] (Fax)      Agent: Please be advised that RX refills may take up to 3 business days. We ask that you follow-up with your pharmacy.

## 2018-10-08 NOTE — Telephone Encounter (Signed)
Pt sees Dr Sharlet Salina, please Advise

## 2018-10-09 ENCOUNTER — Other Ambulatory Visit: Payer: Self-pay | Admitting: Family Medicine

## 2018-10-09 DIAGNOSIS — K219 Gastro-esophageal reflux disease without esophagitis: Secondary | ICD-10-CM

## 2018-10-11 ENCOUNTER — Other Ambulatory Visit: Payer: Self-pay | Admitting: Internal Medicine

## 2018-10-11 NOTE — Progress Notes (Signed)
Subjective:   ERICHA [name] is a 84 y.o. female who presents for Medicare Annual (Subsequent) preventive examination.  Review of Systems:  No ROS.  Medicare Wellness Visit. Additional risk factors are reflected in the social history.  Cardiac Risk Factors include: dyslipidemia;advanced age (>41men, >70 women);hypertension Sleep patterns: gets up 1-2 times nightly to void and sleeps 7-8 hours nightly.    Home Safety/Smoke Alarms: Feels safe in home. Smoke alarms in place.  Living environment; residence and Firearm Safety: 1-story house/ trailer, equipment: Radio producer, Type: [place], no firearms. Lives with family,  needs tub bench DME at risk for falls while in the shower and tub, good support system Seat Belt Safety/Bike Helmet: Wears seat belt.      Objective:     Vitals: BP (!) 142/62   Pulse 76   Resp 18   Ht 5\' 3"  (1.6 m)   Wt 172 lb (78 kg)   SpO2 99%   BMI 30.47 kg/m   Body mass index is 30.47 kg/m.  Advanced Directives 10/12/2018 07/16/2017 10/23/2015 08/17/2015 08/10/2015  Does Patient Have a Medical Advance Directive? No No No Yes Yes  Does patient want to make changes to medical advance directive? Yes (ED - Information included in AVS) - - - -  Copy of Healthcare Power of Attorney in Chart? - - - Yes Yes  Would patient like information on creating a medical advance directive? - - No - patient declined information - -    Tobacco Social History   Tobacco Use  Smoking Status Former Smoker  . Packs/day: 0.20  . Years: 25.00  . Pack years: 5.00  . Types: Cigarettes  . Last attempt to quit: 12/01/1989  . Years since quitting: 28.8  Smokeless Tobacco Never Used  Tobacco Comment   smoked a long time ago; Quit 32 or 91/ number is approximate     Counseling given: Not Answered Comment: smoked a long time ago; Quit 60 or 91/ number is approximate  Past Medical History:  Diagnosis Date  . ANEMIA, IRON DEFICIENCY 09/10/2007  . CATARACT EXTRACTION, HX OF  09/10/2007  . DYSPEPSIA, HX OF 09/10/2007  . HAIR LOSS 03/21/2009  . HAY FEVER 09/10/2007  . HEARING LOSS, SENSORINEURAL 09/10/2007  . HYPERLIPIDEMIA 09/10/2007  . HYPERTENSION 09/10/2007  . LOW BACK PAIN 05/10/2008  . Pain in joint, hand 03/01/2008  . PEPTIC ULCER DISEASE 09/10/2007  . POLYP, COLON 09/10/2007  . POLYPECTOMY, HX OF 09/10/2007  . SHINGLES 08/13/2009  . SHOULDER PAIN, LEFT 02/12/2008  . TAH/BSO, HX OF 09/10/2007   Past Surgical History:  Procedure Laterality Date  . CATARACT EXTRACTION    . IRRIGATION AND DEBRIDEMENT SEBACEOUS CYST    . POLYPECTOMY    . TOTAL ABDOMINAL HYSTERECTOMY W/ BILATERAL SALPINGOOPHORECTOMY     Family History  Problem Relation Age of Onset  . Hypertension Mother   . Coronary artery disease Mother   . Heart disease Mother        CHF  . Cancer Neg Hx        breast or colon  . Diabetes Neg Hx    Social History   Socioeconomic History  . Marital status: Widowed    Spouse name: Not on file  . Number of children: 4  . Years of education: Not on file  . Highest education level: Not on file  Occupational History  . 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 Emergency planning/management officer  strain: Not hard at all  . Food insecurity:    Worry: Never true    Inability: Never true  . Transportation needs:    Medical: No    Non-medical: No  Tobacco Use  . Smoking status: Former Smoker    Packs/day: 0.20    Years: 25.00    Pack years: 5.00    Types: Cigarettes    Last attempt to quit: 12/01/1989    Years since quitting: 28.8  . Smokeless tobacco: Never Used  . Tobacco comment: smoked a long time ago; Quit 90 or 91/ number is approximate  Substance and Sexual Activity  . Alcohol use: No    Alcohol/week: 0.0 standard drinks  . Drug use: No  . Sexual activity: Not Currently  Lifestyle  . Physical activity:    Days per week: 0 days    Minutes per session: 0 min  . Stress: Not at all    Relationships  . Social connections:    Talks on phone: More than three times a week    Gets together: More than three times a week    Attends religious service: 1 to 4 times per year    Active member of club or organization: Yes    Attends meetings of clubs or organizations: 1 to 4 times per year    Relationship status: Widowed  Other Topics Concern  . Not on file  Social History Narrative   3 sons, 54,59, 21 and 1 daughter 72, 4 grandchildren   SO with multiple medical problems    Outpatient Encounter Medications as of 10/12/2018  Medication Sig  . albuterol (PROVENTIL HFA;VENTOLIN HFA) 108 (90 Base) MCG/ACT inhaler Inhale 2 puffs into the lungs every 4 (four) hours as needed for wheezing or shortness of breath.  . ALPRAZolam (XANAX) 0.5 MG tablet TAKE 1 TABLET EVERY DAY AS NEEDED FOR ANXIETY  . alum & mag hydroxide-simeth (MYLANTA) 174-944-96 MG/5ML suspension Take 15 mLs by mouth every 6 (six) hours as needed for indigestion or heartburn.  Marland Kitchen amLODipine (NORVASC) 5 MG tablet Take 1 tablet (5 mg total) by mouth daily.  Marland Kitchen atorvastatin (LIPITOR) 20 MG tablet TAKE 1 TABLET (20 MG TOTAL) BY MOUTH DAILY AT 6 PM. NEED VISIT WITH LABS FOR FURTHER REFILLS  . BREO ELLIPTA 100-25 MCG/INH AEPB INHALE 1 PUFF INTO THE LUNGS DAILY  . furosemide (LASIX) 40 MG tablet TAKE 1 TABLET BY MOUTH EVERY DAY  . olmesartan (BENICAR) 40 MG tablet Take 1 tablet (40 mg total) by mouth daily.  . ondansetron (ZOFRAN-ODT) 4 MG disintegrating tablet TAKE 1 TABLET BY MOUTH EVERY 8 HOURS AS NEEDED FOR NAUSEA AND VOMITING  . pantoprazole (PROTONIX) 20 MG tablet Take 1 tablet (20 mg total) by mouth daily.  Marland Kitchen triamcinolone cream (KENALOG) 0.1 % Apply 1 application topically 2 (two) times daily.  . [DISCONTINUED] pantoprazole (PROTONIX) 20 MG tablet Take 1 tablet (20 mg total) by mouth daily.  . [DISCONTINUED] diclofenac sodium (VOLTAREN) 1 % GEL Apply 4 g topically 4 (four) times daily. (Patient not taking: Reported on  10/12/2018)  . [DISCONTINUED] ranitidine (ZANTAC) 150 MG tablet TAKE 1 TABLET BY MOUTH EVERY DAY (Patient not taking: Reported on 10/12/2018)   No facility-administered encounter medications on file as of 10/12/2018.     Activities of Daily Living In your present state of health, do you have any difficulty performing the following activities: 10/12/2018  Hearing? N  Vision? N  Difficulty concentrating or making decisions? N  Walking or climbing stairs?  N  Dressing or bathing? Y  Doing errands, shopping? N  Preparing Food and eating ? N  Using the Toilet? N  In the past six months, have you accidently leaked urine? N  Do you have problems with loss of bowel control? N  Managing your Medications? N  Managing your Finances? N  Housekeeping or managing your Housekeeping? N  Some recent data might be hidden    Patient Care Team: Hoyt Koch, MD as PCP - General (Internal Medicine) Karl Luke, MD as Referring Physician (Optometry)    Assessment:   This is a routine wellness examination for Gut[place]berg. No ROS.  Medicare Wellness Visit. Additional risk factors are reflected in the social history.   Exercise Activities and Dietary recommendations Current Exercise Habits: The patient does not participate in regular exercise at present(chair exercise print-outs provided), Exercise limited by: orthopedic condition(s)  Diet (meal preparation, eat out, water intake, caffeinated beverages, dairy products, fruits and vegetables): in general, a "healthy" diet  , well balanced   Reviewed heart healthy diet. Encouraged patient to increase daily water and healthy fluid intake.  Goals    . Exercise 150 minutes per week (moderate activity)     States she would like to start walking some;  Grandson bought a treadmill; attempt to start 3 days a week; attempt after breakfast ; will try to work to 30 min; as  tolerated    . Patient Stated     I want to increase my social activity by  checking into the senior centers around [place].       Fall Risk Fall Risk  10/12/2018 09/17/2017 10/03/2016 08/10/2015 01/22/2015  Falls in the past year? 0 No No No No  Risk for fall due to : Impaired balance/gait;Impaired mobility - - - -    Depression Screen PHQ 2/9 Scores 10/12/2018 09/17/2017 10/03/2016 08/10/2015  PHQ - 2 Score 0 0 0 0  PHQ- 9 Score 0 - - -     Cognitive Function MMSE - Mini Mental State Exam 10/12/2018 08/10/2015  Not completed: - (No Data)  Orientation to time 5 -  Orientation to Place 5 -  Registration 3 -  Attention/ Calculation 3 -  Recall 3 -  Language- name 2 objects 2 -  Language- repeat 1 -  Language- follow 3 step command 3 -  Language- read & follow direction 1 -  Write a sentence 1 -  Copy design 1 -  Total score 28 -        Immunization History  Administered Date(s) Administered  . Influenza Split 09/21/2012  . Influenza Whole 11/08/2007, 09/12/2008, 09/11/2009, 09/24/2010  . Influenza, High Dose Seasonal PF 10/11/2013, 08/15/2016, 10/06/2017, 09/01/2018  . Influenza,inj,Quad PF,6+ Mos 08/08/2014, 08/14/2015  . Pneumococcal Conjugate-13 09/22/2013  . Pneumococcal Polysaccharide-23 03/01/2008  . Td 05/17/2010   Screening Tests Health Maintenance  Topic Date Due  . MAMMOGRAM  10/08/2018  . TETANUS/TDAP  05/17/2020  . INFLUENZA VACCINE  Completed  . DEXA SCAN  Completed  . PNA vac Low Risk Adult  Completed      Plan:   Senior resources provided to increase social activity.  Order for tub bench placed for patient's safety and to maintain independence of ADLs  Protonix refilled last yearly visit 09/01/18  Continue doing brain stimulating activities (puzzles, reading, adult coloring books, staying active) to keep memory sharp.   Continue to eat heart healthy diet (full of fruits, vegetables, whole grains, lean protein, water--limit salt, fat, and sugar  intake) and increase physical activity as tolerated.  I have personally  reviewed and noted the following in the patient's chart:   . Medical and social history . Use of alcohol, tobacco or illicit drugs  . Current medications and supplements . Functional ability and status . Nutritional status . Physical activity . Advanced directives . List of other physicians . Vitals . Screenings to include cognitive, depression, and falls . Referrals and appointments  In addition, I have reviewed and discussed with patient certain preventive protocols, quality metrics, and best practice recommendations. A written personalized care plan for preventive services as well as general preventive health recommendations were provided to patient.     Michiel Cowboy, RN  10/12/2018

## 2018-10-12 ENCOUNTER — Ambulatory Visit (INDEPENDENT_AMBULATORY_CARE_PROVIDER_SITE_OTHER): Payer: Medicare Other | Admitting: *Deleted

## 2018-10-12 VITALS — BP 142/62 | HR 76 | Resp 18 | Ht 63.0 in | Wt 172.0 lb

## 2018-10-12 DIAGNOSIS — Z0000 Encounter for general adult medical examination without abnormal findings: Secondary | ICD-10-CM | POA: Diagnosis not present

## 2018-10-12 DIAGNOSIS — K219 Gastro-esophageal reflux disease without esophagitis: Secondary | ICD-10-CM

## 2018-10-12 DIAGNOSIS — Z9181 History of falling: Secondary | ICD-10-CM

## 2018-10-12 MED ORDER — PANTOPRAZOLE SODIUM 20 MG PO TBEC: 20.0000 mg | DELAYED_RELEASE_TABLET | Freq: Every day | ORAL | 1 refills | Status: DC

## 2018-10-12 NOTE — Patient Instructions (Addendum)
Continue doing brain stimulating activities (puzzles, reading, adult coloring books, staying active) to keep memory sharp.   Continue to eat heart healthy diet (full of fruits, vegetables, whole grains, lean protein, water--limit salt, fat, and sugar intake) and increase physical activity as tolerated.   [name] , Thank you for taking time to come for your Medicare Wellness Visit. I appreciate your ongoing commitment to your health goals. Please review the following plan we discussed and let me know if I can assist you in the future.   These are the goals we discussed: Goals    . Exercise 150 minutes per week (moderate activity)     States she would like to start walking some;  Grandson bought a treadmill; attempt to start 3 days a week; attempt after breakfast ; will try to work to 30 min; as  tolerated    . Patient Stated     I want to increase my social activity by checking into the senior centers around [place].       This is a list of the screening recommended for you and due dates:  Health Maintenance  Topic Date Due  . Mammogram  10/08/2018  . Tetanus Vaccine  05/17/2020  . Flu Shot  Completed  . DEXA scan (bone density measurement)  Completed  . Pneumonia vaccines  Completed    Food Choices for Gastroesophageal Reflux Disease, Adult When you have gastroesophageal reflux disease (GERD), the foods you eat and your eating habits are very important. Choosing the right foods can help ease your discomfort. What guidelines do I need to follow?  Choose fruits, vegetables, whole grains, and low-fat dairy products.  Choose low-fat meat, fish, and poultry.  Limit fats such as oils, salad dressings, butter, nuts, and avocado.  Keep a food diary. This helps you identify foods that cause symptoms.  Avoid foods that cause symptoms. These may be different for everyone.  Eat small meals often instead of 3 large meals a day.  Eat your meals slowly, in a place where you are  relaxed.  Limit fried foods.  Cook foods using methods other than frying.  Avoid drinking alcohol.  Avoid drinking large amounts of liquids with your meals.  Avoid bending over or lying down until 2-3 hours after eating. What foods are not recommended? These are some foods and drinks that may make your symptoms worse: Vegetables Tomatoes. Tomato juice. Tomato and spaghetti sauce. Chili peppers. Onion and garlic. Horseradish. Fruits Oranges, grapefruit, and lemon (fruit and juice). Meats High-fat meats, fish, and poultry. This includes hot dogs, ribs, ham, sausage, salami, and bacon. Dairy Whole milk and chocolate milk. Sour cream. Cream. Butter. Ice cream. Cream cheese. Drinks Coffee and tea. Bubbly (carbonated) drinks or energy drinks. Condiments Hot sauce. Barbecue sauce. Sweets/Desserts Chocolate and cocoa. Donuts. Peppermint and spearmint. Fats and Oils High-fat foods. This includes [place] fries and potato chips. Other Vinegar. Strong spices. This includes black pepper, white pepper, red pepper, cayenne, curry powder, cloves, ginger, and chili powder. The items listed above may not be a complete list of foods and drinks to avoid. Contact your dietitian for more information. This information is not intended to replace advice given to you by your health care provider. Make sure you discuss any questions you have with your health care provider. Document Released: 05/18/2012 Document Revised: 04/24/2016 Document Reviewed: 09/21/2013 Elsevier Interactive Patient Education  2017 [place] Maintenance, Female Adopting a healthy lifestyle and getting preventive care can go a long way  to promote health and wellness. Talk with your health care provider about what schedule of regular examinations is right for you. This is a good chance for you to check in with your provider about disease prevention and staying healthy. In between checkups, there are plenty of things you  can do on your own. Experts have done a lot of research about which lifestyle changes and preventive measures are most likely to keep you healthy. Ask your health care provider for more information. Weight and diet Eat a healthy diet  Be sure to include plenty of vegetables, fruits, low-fat dairy products, and lean protein.  Do not eat a lot of foods high in solid fats, added sugars, or salt.  Get regular exercise. This is one of the most important things you can do for your health. ? Most adults should exercise for at least 150 minutes each week. The exercise should increase your heart rate and make you sweat (moderate-intensity exercise). ? Most adults should also do strengthening exercises at least twice a week. This is in addition to the moderate-intensity exercise.  Maintain a healthy weight  Body mass index (BMI) is a measurement that can be used to identify possible weight problems. It estimates body fat based on height and weight. Your health care provider can help determine your BMI and help you achieve or maintain a healthy weight.  For females 85 years of age and older: ? A BMI below 18.5 is considered underweight. ? A BMI of 18.5 to 24.9 is normal. ? A BMI of 25 to 29.9 is considered overweight. ? A BMI of 30 and above is considered obese.  Watch levels of cholesterol and blood lipids  You should start having your blood tested for lipids and cholesterol at 82 years of age, then have this test every 5 years.  You may need to have your cholesterol levels checked more often if: ? Your lipid or cholesterol levels are high. ? You are older than 82 years of age. ? You are at high risk for heart disease.  Cancer screening Lung Cancer  Lung cancer screening is recommended for adults 27-25 years old who are at high risk for lung cancer because of a history of smoking.  A yearly low-dose CT scan of the lungs is recommended for people who: ? Currently smoke. ? Have quit within  the past 15 years. ? Have at least a 30-pack-year history of smoking. A pack year is smoking an average of one pack of cigarettes a day for 1 year.  Yearly screening should continue until it has been 15 years since you quit.  Yearly screening should stop if you develop a health problem that would prevent you from having lung cancer treatment.  Breast Cancer  Practice breast self-awareness. This means understanding how your breasts normally appear and feel.  It also means doing regular breast self-exams. Let your health care provider know about any changes, no matter how small.  If you are in your 20s or 30s, you should have a clinical breast exam (CBE) by a health care provider every 1-3 years as part of a regular health exam.  If you are 61 or older, have a CBE every year. Also consider having a breast X-ray (mammogram) every year.  If you have a family history of breast cancer, talk to your health care provider about genetic screening.  If you are at high risk for breast cancer, talk to your health care provider about having an MRI and a  mammogram every year.  Breast cancer gene (BRCA) assessment is recommended for women who have family members with BRCA-related cancers. BRCA-related cancers include: ? Breast. ? Ovarian. ? Tubal. ? Peritoneal cancers.  Results of the assessment will determine the need for genetic counseling and BRCA1 and BRCA2 testing.  Cervical Cancer Your health care provider may recommend that you be screened regularly for cancer of the pelvic organs (ovaries, uterus, and vagina). This screening involves a pelvic examination, including checking for microscopic changes to the surface of your cervix (Pap test). You may be encouraged to have this screening done every 3 years, beginning at age 38.  For women ages 23-65, health care providers may recommend pelvic exams and Pap testing every 3 years, or they may recommend the Pap and pelvic exam, combined with testing  for human papilloma virus (HPV), every 5 years. Some types of HPV increase your risk of cervical cancer. Testing for HPV may also be done on women of any age with unclear Pap test results.  Other health care providers may not recommend any screening for nonpregnant women who are considered low risk for pelvic cancer and who do not have symptoms. Ask your health care provider if a screening pelvic exam is right for you.  If you have had past treatment for cervical cancer or a condition that could lead to cancer, you need Pap tests and screening for cancer for at least 20 years after your treatment. If Pap tests have been discontinued, your risk factors (such as having a new sexual partner) need to be reassessed to determine if screening should resume. Some women have medical problems that increase the chance of getting cervical cancer. In these cases, your health care provider may recommend more frequent screening and Pap tests.  Colorectal Cancer  This type of cancer can be detected and often prevented.  Routine colorectal cancer screening usually begins at 82 years of age and continues through 82 years of age.  Your health care provider may recommend screening at an earlier age if you have risk factors for colon cancer.  Your health care provider may also recommend using home test kits to check for hidden blood in the stool.  A small camera at the end of a tube can be used to examine your colon directly (sigmoidoscopy or colonoscopy). This is done to check for the earliest forms of colorectal cancer.  Routine screening usually begins at age 74.  Direct examination of the colon should be repeated every 5-10 years through 82 years of age. However, you may need to be screened more often if early forms of precancerous polyps or small growths are found.  Skin Cancer  Check your skin from head to toe regularly.  Tell your health care provider about any new moles or changes in moles, especially  if there is a change in a mole's shape or color.  Also tell your health care provider if you have a mole that is larger than the size of a pencil eraser.  Always use sunscreen. Apply sunscreen liberally and repeatedly throughout the day.  Protect yourself by wearing long sleeves, pants, a wide-brimmed hat, and sunglasses whenever you are outside.  Heart disease, diabetes, and high blood pressure  High blood pressure causes heart disease and increases the risk of stroke. High blood pressure is more likely to develop in: ? People who have blood pressure in the high end of the normal range (130-139/85-89 mm Hg). ? People who are overweight or obese. ? People  who are African American.  If you are 3-48 years of age, have your blood pressure checked every 3-5 years. If you are 63 years of age or older, have your blood pressure checked every year. You should have your blood pressure measured twice-once when you are at a hospital or clinic, and once when you are not at a hospital or clinic. Record the average of the two measurements. To check your blood pressure when you are not at a hospital or clinic, you can use: ? An automated blood pressure machine at a pharmacy. ? A home blood pressure monitor.  If you are between 42 years and 68 years old, ask your health care provider if you should take aspirin to prevent strokes.  Have regular diabetes screenings. This involves taking a blood sample to check your fasting blood sugar level. ? If you are at a normal weight and have a low risk for diabetes, have this test once every three years after 82 years of age. ? If you are overweight and have a high risk for diabetes, consider being tested at a younger age or more often. Preventing infection Hepatitis B  If you have a higher risk for hepatitis B, you should be screened for this virus. You are considered at high risk for hepatitis B if: ? You were born in a country where hepatitis B is common. Ask  your health care provider which countries are considered high risk. ? Your parents were born in a high-risk country, and you have not been immunized against hepatitis B (hepatitis B vaccine). ? You have HIV or AIDS. ? You use needles to inject street drugs. ? You live with someone who has hepatitis B. ? You have had sex with someone who has hepatitis B. ? You get hemodialysis treatment. ? You take certain medicines for conditions, including cancer, organ transplantation, and autoimmune conditions.  Hepatitis C  Blood testing is recommended for: ? Everyone born from [birth year] through 1963. ? Anyone with known risk factors for hepatitis C.  Sexually transmitted infections (STIs)  You should be screened for sexually transmitted infections (STIs) including gonorrhea and chlamydia if: ? You are sexually active and are younger than 82 years of age. ? You are older than 82 years of age and your health care provider tells you that you are at risk for this type of infection. ? Your sexual activity has changed since you were last screened and you are at an increased risk for chlamydia or gonorrhea. Ask your health care provider if you are at risk.  If you do not have HIV, but are at risk, it may be recommended that you take a prescription medicine daily to prevent HIV infection. This is called pre-exposure prophylaxis (PrEP). You are considered at risk if: ? You are sexually active and do not regularly use condoms or know the HIV status of your partner(s). ? You take drugs by injection. ? You are sexually active with a partner who has HIV.  Talk with your health care provider about whether you are at high risk of being infected with HIV. If you choose to begin PrEP, you should first be tested for HIV. You should then be tested every 3 months for as long as you are taking PrEP. Pregnancy  If you are premenopausal and you may become pregnant, ask your health care provider about preconception  counseling.  If you may become pregnant, take 400 to 800 micrograms (mcg) of folic acid every day.  If you  want to prevent pregnancy, talk to your health care provider about birth control (contraception). Osteoporosis and menopause  Osteoporosis is a disease in which the bones lose minerals and strength with aging. This can result in serious bone fractures. Your risk for osteoporosis can be identified using a bone density scan.  If you are 16 years of age or older, or if you are at risk for osteoporosis and fractures, ask your health care provider if you should be screened.  Ask your health care provider whether you should take a calcium or vitamin D supplement to lower your risk for osteoporosis.  Menopause may have certain physical symptoms and risks.  Hormone replacement therapy may reduce some of these symptoms and risks. Talk to your health care provider about whether hormone replacement therapy is right for you. Follow these instructions at home:  Schedule regular health, dental, and eye exams.  Stay current with your immunizations.  Do not use any tobacco products including cigarettes, chewing tobacco, or electronic cigarettes.  If you are pregnant, do not drink alcohol.  If you are breastfeeding, limit how much and how often you drink alcohol.  Limit alcohol intake to no more than 1 drink per day for nonpregnant women. One drink equals 12 ounces of beer, 5 ounces of wine, or 1 ounces of hard liquor.  Do not use street drugs.  Do not share needles.  Ask your health care provider for help if you need support or information about quitting drugs.  Tell your health care provider if you often feel depressed.  Tell your health care provider if you have ever been abused or do not feel safe at home. This information is not intended to replace advice given to you by your health care provider. Make sure you discuss any questions you have with your health care provider. Document  Released: 06/02/2011 Document Revised: 04/24/2016 Document Reviewed: 08/21/2015 Elsevier Interactive Patient Education  [employer].

## 2018-10-12 NOTE — Progress Notes (Signed)
Medical screening examination/treatment/procedure(s) were performed by non-physician practitioner and as supervising physician I was immediately available for consultation/collaboration. I agree with above. Mckaila Duffus A Hensley Treat, MD 

## 2018-10-23 ENCOUNTER — Other Ambulatory Visit: Payer: Self-pay | Admitting: Internal Medicine

## 2018-10-27 ENCOUNTER — Encounter: Payer: Self-pay | Admitting: Internal Medicine

## 2018-10-27 LAB — HM MAMMOGRAPHY

## 2018-10-27 NOTE — Progress Notes (Signed)
Abstracted and sent to scan  

## 2018-11-19 NOTE — Telephone Encounter (Signed)
error 

## 2018-12-07 ENCOUNTER — Other Ambulatory Visit: Payer: Self-pay | Admitting: Internal Medicine

## 2018-12-08 ENCOUNTER — Other Ambulatory Visit: Payer: Self-pay | Admitting: Internal Medicine

## 2018-12-08 DIAGNOSIS — K219 Gastro-esophageal reflux disease without esophagitis: Secondary | ICD-10-CM

## 2019-01-08 ENCOUNTER — Other Ambulatory Visit: Payer: Self-pay | Admitting: Internal Medicine

## 2019-01-08 DIAGNOSIS — K219 Gastro-esophageal reflux disease without esophagitis: Secondary | ICD-10-CM

## 2019-01-18 ENCOUNTER — Other Ambulatory Visit: Payer: Self-pay | Admitting: Internal Medicine

## 2019-01-18 DIAGNOSIS — F419 Anxiety disorder, unspecified: Secondary | ICD-10-CM

## 2019-01-18 NOTE — Telephone Encounter (Signed)
Called pharmacy last refill 07/27/2018 for 20 tabs LOV: 09/01/2018 CPE [id number]

## 2019-02-08 ENCOUNTER — Other Ambulatory Visit: Payer: Self-pay | Admitting: Internal Medicine

## 2019-02-08 DIAGNOSIS — K219 Gastro-esophageal reflux disease without esophagitis: Secondary | ICD-10-CM

## 2019-02-10 ENCOUNTER — Other Ambulatory Visit: Payer: Self-pay | Admitting: Internal Medicine

## 2019-03-06 ENCOUNTER — Other Ambulatory Visit: Payer: Self-pay | Admitting: Internal Medicine

## 2019-03-06 DIAGNOSIS — K219 Gastro-esophageal reflux disease without esophagitis: Secondary | ICD-10-CM

## 2019-03-07 ENCOUNTER — Other Ambulatory Visit: Payer: Self-pay | Admitting: Internal Medicine

## 2019-03-07 DIAGNOSIS — F419 Anxiety disorder, unspecified: Secondary | ICD-10-CM

## 2019-03-07 NOTE — Telephone Encounter (Signed)
Control database checked last refill: 01/19/2019 LOV;09/01/2018 NOV: none

## 2019-03-25 ENCOUNTER — Telehealth: Payer: Self-pay

## 2019-03-25 NOTE — Telephone Encounter (Signed)
Copied from [place] [id number]. Topic: General - Other >> Mar 25, 2019  2:23 PM Leward Quan A wrote: Reason for CRM: Patient called to say that when she took pantoprazole (PROTONIX) 20 MG tablet she was having abdominal pains and more than usual gas. Say that she have not taken it in about 3 weeks. Asking can she be prescribed some Pepsid instead. Say that she is having some issues with gas. Ph# [phone]

## 2019-03-28 NOTE — Telephone Encounter (Signed)
Pepcid is over the counter and she can definitely try that instead.

## 2019-03-28 NOTE — Telephone Encounter (Signed)
Patient informed of MD response and stated understanding  

## 2019-04-01 ENCOUNTER — Other Ambulatory Visit: Payer: Self-pay | Admitting: Internal Medicine

## 2019-04-01 DIAGNOSIS — K219 Gastro-esophageal reflux disease without esophagitis: Secondary | ICD-10-CM

## 2019-04-12 ENCOUNTER — Other Ambulatory Visit: Payer: Self-pay | Admitting: Internal Medicine

## 2019-04-22 ENCOUNTER — Other Ambulatory Visit: Payer: Self-pay | Admitting: Internal Medicine

## 2019-04-22 DIAGNOSIS — E782 Mixed hyperlipidemia: Secondary | ICD-10-CM

## 2019-04-27 ENCOUNTER — Other Ambulatory Visit: Payer: Self-pay | Admitting: Internal Medicine

## 2019-04-27 ENCOUNTER — Ambulatory Visit: Payer: Self-pay

## 2019-04-27 NOTE — Telephone Encounter (Signed)
Incoming call from  Patient with a complaint of gas build up.  Patient complain of pain across midway of her stomach.  Onset was two weeks ago.   Reports a gradual onset that comes and goes.  Rates it mild to moderate.  Reports that as of today she is constipated.  Denies any problems voiding.  Has taking mylanta, and pepcid. Provided  Temporally   help.  Rec.   warm  Liquid and   Oatmeal Reviewed Protocol.    And gave Care advice.  Encouraged Pt. To call back if Sx worsen.     Jamie [name] Female, 85 y.o., 1933-08-04 MRN:  [id number] Phone:  [phone] (H) PCP:  Jamie Koch, MD Primary Cvg:  Onnie [facility] 10/14/2019 at 9:00 AM Message from Berneta Levins sent at 04/27/2019 9:58 AM EDT   Summary: OTC recommendation   Pt states she is having some discomfort with gas buildup. Pt states no sharp pains, just gas build up. Pt states that otherwise she feels well. Pt wondering if she can recommend anything that she can get OTC for this.        Call History    Type Contact  04/27/2019 09:57 AM Phone (Incoming) Jamie [name] (Self)  Phone: [phone]  User: Berneta Levins    Reason for Disposition . [1] MILD-MODERATE pain AND [2] comes and goes (cramps)  Answer Assessment - Initial Assessment Questions 1. LOCATION: "Where does it hurt?"      Middle ways of stomach 2. RADIATION: "Does the pain shoot anywhere else?" (e.g., chest, back)    Back shouder blades 3. ONSET: "When did the pain begin?" (e.g., minutes, hours or days ago)      2 weeks 4. SUDDEN: "Gradual or sudden onset?"     Gradual5. PATTERN "Does the pain come and go, or is it constant?"    - If constant: "Is it getting better, staying the same, or worsening?"      (Note: Constant means the pain never goes away completely; most serious pain is constant and it progresses)     - If intermittent: "How long does it last?" "Do  you have pain now?"     (Note: Intermittent means the pain goes away completely between bouts)     *No Answer* 6. SEVERITY: "How bad is the pain?"  (e.g., Scale 1-10; mild, moderate, or severe)   - MILD (1-3): doesn't interfere with normal activities, abdomen soft and not tender to touch    - MODERATE (4-7): interferes with normal activities or awakens from sleep, tender to touch    - SEVERE (8-10): excruciating pain, doubled over, unable to do any normal activities      Mild  To moderateURRENT SYMPTOM: "Have you ever had this type of abdominal pain before?" If so, ask: "When was the last time?" and "What happened that time?"      Yes , rananitine, myalanta  small portions 8. CAUSE: "What do you think is causing the abdominal pain?"    Broccoli collard greens 9. RELIEVING/AGGRAVATING FACTORS: "What makes it better or worse?" (e.g., movement, antacids, bowel movement)     mylanta  10. OTHER SYMPTOMS: "Has there been any vomiting, diarrhea, constipation, or urine problems?"      Today constipated 11. PREGNANCY: "Is there any chance you are pregnant?" "When was your last menstrual period?"       na  Protocols used: ABDOMINAL PAIN - [facility]

## 2019-05-15 ENCOUNTER — Other Ambulatory Visit: Payer: Self-pay | Admitting: Internal Medicine

## 2019-05-15 DIAGNOSIS — F419 Anxiety disorder, unspecified: Secondary | ICD-10-CM

## 2019-05-16 NOTE — Telephone Encounter (Signed)
Control database checked last refill: 03/07/2019  LOV: 09/01/2018 [id number]

## 2019-05-18 ENCOUNTER — Other Ambulatory Visit: Payer: Self-pay | Admitting: Internal Medicine

## 2019-05-23 ENCOUNTER — Other Ambulatory Visit: Payer: Self-pay | Admitting: *Deleted

## 2019-05-23 DIAGNOSIS — Z20822 Contact with and (suspected) exposure to covid-19: Secondary | ICD-10-CM

## 2019-05-26 NOTE — Addendum Note (Signed)
Addended by: Brigitte Pulse on: 05/26/2019 04:08 PM   Modules accepted: Orders

## 2019-06-03 ENCOUNTER — Other Ambulatory Visit: Payer: Self-pay | Admitting: Internal Medicine

## 2019-06-04 ENCOUNTER — Other Ambulatory Visit: Payer: Self-pay | Admitting: Internal Medicine

## 2019-07-14 ENCOUNTER — Other Ambulatory Visit: Payer: Self-pay | Admitting: Internal Medicine

## 2019-07-15 ENCOUNTER — Other Ambulatory Visit: Payer: Self-pay

## 2019-07-15 ENCOUNTER — Ambulatory Visit (INDEPENDENT_AMBULATORY_CARE_PROVIDER_SITE_OTHER): Payer: Medicare Other | Admitting: Internal Medicine

## 2019-07-15 ENCOUNTER — Telehealth: Payer: Self-pay | Admitting: Internal Medicine

## 2019-07-15 ENCOUNTER — Encounter: Payer: Self-pay | Admitting: Internal Medicine

## 2019-07-15 DIAGNOSIS — K219 Gastro-esophageal reflux disease without esophagitis: Secondary | ICD-10-CM | POA: Diagnosis not present

## 2019-07-15 MED ORDER — FAMOTIDINE 20 MG PO TABS: 20.0000 mg | ORAL_TABLET | Freq: Two times a day (BID) | ORAL | 3 refills | Status: DC

## 2019-07-15 NOTE — Progress Notes (Signed)
Virtual Visit via Audio Note  I connected with Jamie [name] on 07/15/19 at  2:20 PM EDT by an audio-only enabled telemedicine application and verified that I am speaking with the correct person using two identifiers.  The patient and the provider were at separate locations throughout the entire encounter.   I discussed the limitations of evaluation and management by telemedicine and the availability of in person appointments. The patient expressed understanding and agreed to proceed.  History of Present Illness: The patient is a 85 y.o. female with visit for gas and abdominal pain after eating. She takes mylanta which helps some. Starts after eating a little while later. Started the last couple of months. Has been out of protonix and ranitidine for some time. Denies blood in stool or nausea/vomiting. She is still eating and drinking normally. Overall it is stable for the last several months. Has tried omeprazole over the counter which helped but she read the bottle and this told her to not take more than 14 days.   Observations/Objective: Voice strong, no coughing or dyspnea during visit, A and O times 3  Assessment and Plan: See problem oriented charting  Follow Up Instructions: rx pepcid bid  Visit time 11 minutes: that time was spent in direct counseling and coordination of care with the patient: counseled about as above  I discussed the assessment and treatment plan with the patient. The patient was provided an opportunity to ask questions and all were answered. The patient agreed with the plan and demonstrated an understanding of the instructions.   The patient was advised to call back or seek an in-person evaluation if the symptoms worsen or if the condition fails to improve as anticipated.  Hoyt Koch, MD

## 2019-07-15 NOTE — Assessment & Plan Note (Signed)
She is having symptoms off zantac. Rx pepcid to replace.

## 2019-07-15 NOTE — Telephone Encounter (Signed)
I'm not sure how to address this. Can do virtual or in person if she wants visit. This isn't really enough information to give any advice.

## 2019-07-15 NOTE — Telephone Encounter (Signed)
Can you make patient an appointment can do phone call if needed

## 2019-07-15 NOTE — Telephone Encounter (Signed)
Pt would like some advice with her having discomfort with gas. Please advise

## 2019-07-15 NOTE — Telephone Encounter (Signed)
Scheduled

## 2019-07-23 ENCOUNTER — Encounter: Payer: Self-pay | Admitting: Family Medicine

## 2019-07-23 ENCOUNTER — Ambulatory Visit (INDEPENDENT_AMBULATORY_CARE_PROVIDER_SITE_OTHER): Payer: Medicare Other | Admitting: Family Medicine

## 2019-07-23 ENCOUNTER — Other Ambulatory Visit: Payer: Self-pay

## 2019-07-23 VITALS — BP 124/50 | HR 85 | Ht 63.0 in | Wt 172.0 lb

## 2019-07-23 DIAGNOSIS — R2689 Other abnormalities of gait and mobility: Secondary | ICD-10-CM

## 2019-07-23 DIAGNOSIS — K219 Gastro-esophageal reflux disease without esophagitis: Secondary | ICD-10-CM | POA: Diagnosis not present

## 2019-07-23 DIAGNOSIS — E782 Mixed hyperlipidemia: Secondary | ICD-10-CM | POA: Diagnosis not present

## 2019-07-23 DIAGNOSIS — I10 Essential (primary) hypertension: Secondary | ICD-10-CM

## 2019-07-23 MED ORDER — FLUTICASONE PROPIONATE 50 MCG/ACT NA SUSP: 2.0000 | Freq: Every day | NASAL | 6 refills | Status: DC

## 2019-07-23 MED ORDER — MECLIZINE HCL 12.5 MG PO TABS: ORAL_TABLET | ORAL | 0 refills | Status: DC

## 2019-07-23 NOTE — Progress Notes (Signed)
Virtual Visit via Video   Due to the COVID-19 pandemic, this visit was completed with telemedicine (audio/video) technology to reduce patient and provider exposure as well as to preserve personal protective equipment.   I connected with Jamie [name] by a video enabled telemedicine application and verified that I am speaking with the correct person using two identifiers. Location patient: Home Location provider: [facility], Office Persons participating in the virtual visit: JACYLN PETHTEL, Briscoe Deutscher, DO Lonell Grandchild, CMA acting as scribe for Dr. Briscoe Deutscher.   I discussed the limitations of evaluation and management by telemedicine and the availability of in person appointments. The patient expressed understanding and agreed to proceed.  Care Team   Patient Care Team: Hoyt Koch, MD as PCP - General (Internal Medicine) Karl Luke, MD as Referring Physician (Optometry)  Subjective:   HPI:    Dizziness This is a new problem. The current episode started yesterday. Associated symptoms include headaches and weakness. Pertinent negatives include no chest pain, chills, coughing, fever, nausea or vomiting.   She feels like when walking that her balance is off. She only has symptoms when walking. She feels like she is shaky in knees and legs feel weak. "They just don't feel as strong as they did the day before."  She has had no change in appetite or drinking. No urinary symptoms. No nausea or vomiting. She did have this feeling one time before when she took prednisone in the past. She was started on Pepcid on 8/14. This was a change from another medicine for GERD.   Review of Systems  Constitutional: Negative for chills and fever.  HENT: Negative for hearing loss and tinnitus.   Eyes: Negative for blurred vision and double vision.  Respiratory: Negative for cough and wheezing.   Cardiovascular: Negative for chest pain, palpitations and leg swelling.    Gastrointestinal: Negative for nausea and vomiting.  Genitourinary: Negative for dysuria, frequency and urgency.  Neurological: Positive for dizziness, weakness and headaches.       Legs and knees weakness - has had history in the past.  Headaches yesterday   Psychiatric/Behavioral: Negative for depression and suicidal ideas.    Patient Active Problem List   Diagnosis Date Noted  . Mild persistent asthma 04/09/2018  . Degenerative arthritis of left knee 05/26/2017  . Degenerative tear of left medial meniscus 05/01/2017  . Anxiety 01/08/2017  . Obesity 06/17/2016  . Arthritis of carpometacarpal (CMC) joints of both thumbs 06/22/2014  . Routine health maintenance 08/09/2011  . Alopecia 03/21/2009  . Hyperlipidemia 09/10/2007  . Essential hypertension 09/10/2007  . GERD (gastroesophageal reflux disease) 09/10/2007    Social History   Tobacco Use  . Smoking status: Former Smoker    Packs/day: 0.20    Years: 25.00    Pack years: 5.00    Types: Cigarettes    Quit date: 12/01/1989    Years since quitting: 29.6  . Smokeless tobacco: Never Used  . Tobacco comment: smoked a long time ago; Quit 90 or 91/ number is approximate  Substance Use Topics  . Alcohol use: No    Alcohol/week: 0.0 standard drinks   Current Outpatient Medications:  .  ALPRAZolam (XANAX) 0.5 MG tablet, TAKE 1 TABLET BY MOUTH EVERY DAY AS NEEDED FOR ANXIETY, Disp: 20 tablet, Rfl: 2 .  alum & mag hydroxide-simeth (MYLANTA) [id number] MG/5ML suspension, Take 15 mLs by mouth every 6 (six) hours as needed for indigestion or heartburn., Disp: , Rfl:  .  amLODipine (  NORVASC) 5 MG tablet, TAKE 1 TABLET (5 MG TOTAL) BY MOUTH DAILY. NEEDS VISIT FOR ANY FUTURE REFILLS., Disp: 90 tablet, Rfl: 0 .  atorvastatin (LIPITOR) 20 MG tablet, TAKE 1 TABLET (20 MG TOTAL) BY MOUTH DAILY AT 6 PM. NEED VISIT WITH LABS FOR FURTHER REFILLS, Disp: 90 tablet, Rfl: 1 .  BREO ELLIPTA 100-25 MCG/INH AEPB, INHALE 1 PUFF BY MOUTH EVERY DAY, Disp:  60 each, Rfl: 3 .  famotidine (PEPCID) 20 MG tablet, Take 1 tablet (20 mg total) by mouth 2 (two) times daily., Disp: 180 tablet, Rfl: 3 .  furosemide (LASIX) 40 MG tablet, TAKE 1 TABLET BY MOUTH EVERY DAY, Disp: 90 tablet, Rfl: 1 .  olmesartan (BENICAR) 40 MG tablet, TAKE 1 TABLET BY MOUTH EVERY DAY, Disp: 90 tablet, Rfl: 3 .  ondansetron (ZOFRAN-ODT) 4 MG disintegrating tablet, TAKE 1 TABLET BY MOUTH EVERY 8 HOURS AS NEEDED FOR NAUSEA AND VOMITING, Disp: 15 tablet, Rfl: 0 .  PROAIR HFA 108 (90 Base) MCG/ACT inhaler, INHALE 2 PUFFS INTO THE LUNGS EVERY 4 (FOUR) HOURS AS NEEDED FOR WHEEZING OR SHORTNESS OF BREATH., Disp: 25.5 g, Rfl: 3 .  triamcinolone cream (KENALOG) 0.1 %, Apply 1 application topically 2 (two) times daily., Disp: 100 g, Rfl: 3  Allergies  Allergen Reactions  . Aspirin     Stomach upset  . Latex Itching and Rash   Objective:   VITALS: Per patient if applicable, see vitals. GENERAL: Alert and in no acute distress. CARDIOPULMONARY: No increased WOB. Speaking in clear sentences.  PSYCH: Pleasant and cooperative. Speech normal rate and rhythm. Affect is appropriate. Insight and judgement are appropriate. Attention is focused, linear, and appropriate.  NEURO: Oriented as arrived to appointment on time with no prompting.   Depression screen [facility] 2/9 10/12/2018 09/17/2017 10/03/2016  Decreased Interest 0 0 0  Down, Depressed, Hopeless 0 0 0  PHQ - 2 Score 0 0 0  Altered sleeping 0 - -  Tired, decreased energy 0 - -  Change in appetite 0 - -  Feeling bad or failure about yourself  0 - -  Trouble concentrating 0 - -  Moving slowly or fidgety/restless 0 - -  Suicidal thoughts 0 - -  PHQ-9 Score 0 - -  Difficult doing work/chores Not difficult at all - -   Assessment and Plan:   Jamie [name] was seen today for dizziness.  Diagnoses and all orders for this visit:  Imbalance Comments: Symptoms most c/w vertigo. Complains of slight imbalance with walking. No  lightheadedness, CP, SOB, HA. BP normal for patient at 124/50 with HR 85. No new medications or supplements other than Pepcid started over 1 week ago. No other neuro complaints - no weakness, numbness, sensation changes, trouble speaking or swallowing. Patient pleasant, lucid, oriented. Hx of slight cough and congestion over the past few weeks. Reviewed precautions, red flags for posterior stroke. Fall precautions reviewed. Patient does not live alone. Will send message to PCP to check in with patient this week.  Orders: -     fluticasone (FLONASE) 50 MCG/ACT nasal spray; Place 2 sprays into both nostrils daily. -     meclizine (ANTIVERT) 12.5 MG tablet; 1/2 to 1 tab q 8 hours prn vertigo. Okay to hold if too sedating.  Essential hypertension  Mixed hyperlipidemia  Gastroesophageal reflux disease, esophagitis presence not specified   . COVID-19 Education: The signs and symptoms of COVID-19 were discussed with the patient and how to seek care for testing if needed. The importance  of social distancing was discussed today. . Reviewed expectations re: course of current medical issues. . Discussed self-management of symptoms. . Outlined signs and symptoms indicating need for more acute intervention. . Patient verbalized understanding and all questions were answered. Marland Kitchen Health Maintenance issues including appropriate healthy diet, exercise, and smoking avoidance were discussed with patient. . See orders for this visit as documented in the electronic medical record.  Briscoe Deutscher, DO

## 2019-07-28 ENCOUNTER — Other Ambulatory Visit: Payer: Self-pay | Admitting: Internal Medicine

## 2019-08-15 ENCOUNTER — Other Ambulatory Visit: Payer: Self-pay | Admitting: Internal Medicine

## 2019-08-15 DIAGNOSIS — E782 Mixed hyperlipidemia: Secondary | ICD-10-CM

## 2019-08-19 ENCOUNTER — Telehealth: Payer: Self-pay | Admitting: Internal Medicine

## 2019-08-19 NOTE — Telephone Encounter (Unsigned)
Copied from [place] [phone]. Topic: General - Other >> Aug 19, 2019 10:52 AM Celene Kras A wrote: Reason for CRM: Pt called to give update regarding some stomach issues she was having a month ago. Pt states the medication she has been taking is helping with heart burn, but having gas issues. Pt is requesting to have another medication sent in for her. Please advise.  [id number] [address], [place] [address] Phone: [phone] Fax: [phone] Not a 24 hour pharmacy; exact hours not known.

## 2019-08-19 NOTE — Telephone Encounter (Signed)
There is no prescription medicine for gas. She can try beano or gas-x over the counter to help.

## 2019-08-19 NOTE — Telephone Encounter (Signed)
Patient informed of MD response and stated understanding  

## 2019-08-19 NOTE — Telephone Encounter (Signed)
Reason for CRM: Pt called to give update regarding some stomach issues she was having a month ago. Pt states the medication she has been taking is helping with heart burn, but having gas issues. Pt is requesting to have another medication sent in for her. Please advise.

## 2019-09-15 ENCOUNTER — Encounter: Payer: Self-pay | Admitting: Internal Medicine

## 2019-09-15 ENCOUNTER — Ambulatory Visit (INDEPENDENT_AMBULATORY_CARE_PROVIDER_SITE_OTHER): Payer: Medicare Other | Admitting: Internal Medicine

## 2019-09-15 ENCOUNTER — Other Ambulatory Visit: Payer: Self-pay

## 2019-09-15 ENCOUNTER — Other Ambulatory Visit (INDEPENDENT_AMBULATORY_CARE_PROVIDER_SITE_OTHER): Payer: Medicare Other

## 2019-09-15 VITALS — BP 122/60 | HR 89 | Temp 97.9°F | Ht 63.0 in | Wt 169.0 lb

## 2019-09-15 DIAGNOSIS — R140 Abdominal distension (gaseous): Secondary | ICD-10-CM

## 2019-09-15 DIAGNOSIS — Z0000 Encounter for general adult medical examination without abnormal findings: Secondary | ICD-10-CM | POA: Diagnosis not present

## 2019-09-15 DIAGNOSIS — F419 Anxiety disorder, unspecified: Secondary | ICD-10-CM

## 2019-09-15 DIAGNOSIS — J4530 Mild persistent asthma, uncomplicated: Secondary | ICD-10-CM

## 2019-09-15 DIAGNOSIS — Z23 Encounter for immunization: Secondary | ICD-10-CM | POA: Diagnosis not present

## 2019-09-15 DIAGNOSIS — E782 Mixed hyperlipidemia: Secondary | ICD-10-CM

## 2019-09-15 DIAGNOSIS — I10 Essential (primary) hypertension: Secondary | ICD-10-CM

## 2019-09-15 LAB — CBC
HCT: 33.1 % — ABNORMAL LOW (ref 36.0–46.0)
Hemoglobin: 10.8 g/dL — ABNORMAL LOW (ref 12.0–15.0)
MCHC: 32.6 g/dL (ref 30.0–36.0)
MCV: 89 fl (ref 78.0–100.0)
Platelets: 276 10*3/uL (ref 150.0–400.0)
RBC: 3.72 Mil/uL — ABNORMAL LOW (ref 3.87–5.11)
RDW: 15.8 % — ABNORMAL HIGH (ref 11.5–15.5)
WBC: 6.6 10*3/uL (ref 4.0–10.5)

## 2019-09-15 LAB — COMPREHENSIVE METABOLIC PANEL
ALT: 8 U/L (ref 0–35)
AST: 15 U/L (ref 0–37)
Albumin: 4.1 g/dL (ref 3.5–5.2)
Alkaline Phosphatase: 191 U/L — ABNORMAL HIGH (ref 39–117)
BUN: 8 mg/dL (ref 6–23)
CO2: 23 mEq/L (ref 19–32)
Calcium: 9.1 mg/dL (ref 8.4–10.5)
Chloride: 109 mEq/L (ref 96–112)
Creatinine, Ser: 0.78 mg/dL (ref 0.40–1.20)
GFR: 85.02 mL/min (ref >60.00)
Glucose, Bld: 88 mg/dL (ref 70–99)
Potassium: 3.7 mEq/L (ref 3.5–5.1)
Sodium: 141 mEq/L (ref 135–145)
Total Bilirubin: 0.7 mg/dL (ref 0.2–1.2)
Total Protein: 7.4 g/dL (ref 6.0–8.3)

## 2019-09-15 LAB — LIPID PANEL
Cholesterol: 136 mg/dL (ref 0–200)
HDL: 43.3 mg/dL (ref >39.00)
LDL Cholesterol: 82 mg/dL (ref 0–99)
NonHDL: 92.81
Total CHOL/HDL Ratio: 3
Triglycerides: 55 mg/dL (ref 0.0–149.0)
VLDL: 11 mg/dL (ref 0.0–40.0)

## 2019-09-15 LAB — LIPASE: Lipase: 9 U/L — ABNORMAL LOW (ref 11.0–59.0)

## 2019-09-15 LAB — HEMOGLOBIN A1C: Hgb A1c MFr Bld: 5.6 % (ref 4.6–6.5)

## 2019-09-15 NOTE — Assessment & Plan Note (Signed)
Stable no flare today with breo and albuterol prn.

## 2019-09-15 NOTE — Assessment & Plan Note (Signed)
Flu shot given. Pneumonia complete. Shingrix counseled. Tetanus up to date. Colonoscopy aged out. Mammogram aged out, pap smear aged out and dexa declines today due to pandemic. Counseled about sun safety and mole surveillance. Counseled about the dangers of distracted driving. Given 10 year screening recommendations.

## 2019-09-15 NOTE — Progress Notes (Signed)
   Subjective:   Patient ID: Jamie [name], female    DOB: January 01, 1933, 85 y.o.   MRN: [id number]  HPI The patient is an 85 YO female coming in for physical. Last colonoscopy 2009 with benign polyp.  PMH, [facility], social history reviewed and updated.   Review of Systems  Constitutional: Negative.   HENT: Negative.   Eyes: Negative.   Respiratory: Negative for cough, chest tightness and shortness of breath.   Cardiovascular: Negative for chest pain, palpitations and leg swelling.  Gastrointestinal: Positive for abdominal distention, abdominal pain and constipation. Negative for anal bleeding, blood in stool, diarrhea, nausea and vomiting.  Musculoskeletal: Negative.   Skin: Negative.   Neurological: Negative.   Psychiatric/Behavioral: Negative.     Objective:  Physical Exam Constitutional:      Appearance: She is well-developed.  HENT:     Head: Normocephalic and atraumatic.  Neck:     Musculoskeletal: Normal range of motion.  Cardiovascular:     Rate and Rhythm: Normal rate and regular rhythm.  Pulmonary:     Effort: Pulmonary effort is normal. No respiratory distress.     Breath sounds: Normal breath sounds. No wheezing or rales.  Abdominal:     General: Bowel sounds are normal. There is no distension.     Palpations: Abdomen is soft. There is no mass.     Tenderness: There is no abdominal tenderness. There is no rebound.     Hernia: No hernia is present.  Skin:    General: Skin is warm and dry.  Neurological:     Mental Status: She is alert and oriented to person, place, and time.     Coordination: Coordination normal.     Vitals:   09/15/19 0844 09/15/19 0924  BP: (!) 150/60 122/60  Pulse: 89   Temp: 97.9 F (36.6 C)   TempSrc: Oral   SpO2: 99%   Weight: 169 lb (76.7 kg)   Height: 5\' 3"  (1.6 m)     Assessment & Plan:  Flu shot given at visit

## 2019-09-15 NOTE — Assessment & Plan Note (Signed)
Uses xanax prn. Counseled about risk and benefit and she wishes to continue prn.

## 2019-09-15 NOTE — Assessment & Plan Note (Signed)
Checking lipase, CBC, CMP and CT abdomen and pelvis. Weight is declining in the last several years. She is given and explained fodmap diet to see if this helps. Pepcid for gerd has helped with reflux but not bloating.

## 2019-09-15 NOTE — Assessment & Plan Note (Signed)
BP at goal on amlodipine, lasix, olmesartan. Checking CMP and adjust as needed.

## 2019-09-15 NOTE — Assessment & Plan Note (Signed)
Checking lipid panel and adjust lipitor 20 mg daily as needed. 

## 2019-09-15 NOTE — Patient Instructions (Signed)
We are going to get a scan of the stomach to check that out.  We have given you some information about fodmap diet and this lets you know the common food causes of bloating in the stomach.  Try no dairy for 2 weeks to see if this helps. If this helps continue with no dairy. If this helps some keep no dairy and look at the diet information and work on other foods which may be causing some of the stomach problems.    Health Maintenance, Female Adopting a healthy lifestyle and getting preventive care are important in promoting health and wellness. Ask your health care provider about:  The right schedule for you to have regular tests and exams.  Things you can do on your own to prevent diseases and keep yourself healthy. What should I know about diet, weight, and exercise? Eat a healthy diet   Eat a diet that includes plenty of vegetables, fruits, low-fat dairy products, and lean protein.  Do not eat a lot of foods that are high in solid fats, added sugars, or sodium. Maintain a healthy weight Body mass index (BMI) is used to identify weight problems. It estimates body fat based on height and weight. Your health care provider can help determine your BMI and help you achieve or maintain a healthy weight. Get regular exercise Get regular exercise. This is one of the most important things you can do for your health. Most adults should:  Exercise for at least 150 minutes each week. The exercise should increase your heart rate and make you sweat (moderate-intensity exercise).  Do strengthening exercises at least twice a week. This is in addition to the moderate-intensity exercise.  Spend less time sitting. Even light physical activity can be beneficial. Watch cholesterol and blood lipids Have your blood tested for lipids and cholesterol at 83 years of age, then have this test every 5 years. Have your cholesterol levels checked more often if:  Your lipid or cholesterol levels are high.  You  are older than 83 years of age.  You are at high risk for heart disease. What should I know about cancer screening? Depending on your health history and family history, you may need to have cancer screening at various ages. This may include screening for:  Breast cancer.  Cervical cancer.  Colorectal cancer.  Skin cancer.  Lung cancer. What should I know about heart disease, diabetes, and high blood pressure? Blood pressure and heart disease  High blood pressure causes heart disease and increases the risk of stroke. This is more likely to develop in people who have high blood pressure readings, are of African descent, or are overweight.  Have your blood pressure checked: ? Every 3-5 years if you are 83-60 years of age. ? Every year if you are 83 years old or older. Diabetes Have regular diabetes screenings. This checks your fasting blood sugar level. Have the screening done:  Once every three years after age 83 if you are at a normal weight and have a low risk for diabetes.  More often and at a younger age if you are overweight or have a high risk for diabetes. What should I know about preventing infection? Hepatitis B If you have a higher risk for hepatitis B, you should be screened for this virus. Talk with your health care provider to find out if you are at risk for hepatitis B infection. Hepatitis C Testing is recommended for:  Everyone born from [birth year] through 1963.  Anyone  with known risk factors for hepatitis C. Sexually transmitted infections (STIs)  Get screened for STIs, including gonorrhea and chlamydia, if: ? You are sexually active and are younger than 83 years of age. ? You are older than 83 years of age and your health care provider tells you that you are at risk for this type of infection. ? Your sexual activity has changed since you were last screened, and you are at increased risk for chlamydia or gonorrhea. Ask your health care provider if you are at risk.   Ask your health care provider about whether you are at high risk for HIV. Your health care provider may recommend a prescription medicine to help prevent HIV infection. If you choose to take medicine to prevent HIV, you should first get tested for HIV. You should then be tested every 3 months for as long as you are taking the medicine. Pregnancy  If you are about to stop having your period (premenopausal) and you may become pregnant, seek counseling before you get pregnant.  Take 400 to 800 micrograms (mcg) of folic acid every day if you become pregnant.  Ask for birth control (contraception) if you want to prevent pregnancy. Osteoporosis and menopause Osteoporosis is a disease in which the bones lose minerals and strength with aging. This can result in bone fractures. If you are 80 years old or older, or if you are at risk for osteoporosis and fractures, ask your health care provider if you should:  Be screened for bone loss.  Take a calcium or vitamin D supplement to lower your risk of fractures.  Be given hormone replacement therapy (HRT) to treat symptoms of menopause. Follow these instructions at home: Lifestyle  Do not use any products that contain nicotine or tobacco, such as cigarettes, e-cigarettes, and chewing tobacco. If you need help quitting, ask your health care provider.  Do not use street drugs.  Do not share needles.  Ask your health care provider for help if you need support or information about quitting drugs. Alcohol use  Do not drink alcohol if: ? Your health care provider tells you not to drink. ? You are pregnant, may be pregnant, or are planning to become pregnant.  If you drink alcohol: ? Limit how much you use to 0-1 drink a day. ? Limit intake if you are breastfeeding.  Be aware of how much alcohol is in your drink. In the [place] one drink equals one 12 oz bottle of beer (355 mL), one 5 oz glass of wine (148 mL), or one 1 oz glass of hard liquor (44  mL). General instructions  Schedule regular health, dental, and eye exams.  Stay current with your vaccines.  Tell your health care provider if: ? You often feel depressed. ? You have ever been abused or do not feel safe at home. Summary  Adopting a healthy lifestyle and getting preventive care are important in promoting health and wellness.  Follow your health care provider's instructions about healthy diet, exercising, and getting tested or screened for diseases.  Follow your health care provider's instructions on monitoring your cholesterol and blood pressure. This information is not intended to replace advice given to you by your health care provider. Make sure you discuss any questions you have with your health care provider. Document Released: 06/02/2011 Document Revised: 11/10/2018 Document Reviewed: 11/10/2018 Elsevier Patient Education  2020 [employer].

## 2019-09-26 ENCOUNTER — Ambulatory Visit
Admission: RE | Admit: 2019-09-26 | Discharge: 2019-09-26 | Disposition: A | Discharge status: Home / Self Care | Payer: Medicare Other | Source: Ambulatory Visit | Attending: Internal Medicine | Admitting: Internal Medicine

## 2019-09-26 DIAGNOSIS — R140 Abdominal distension (gaseous): Secondary | ICD-10-CM

## 2019-09-26 MED ORDER — IOPAMIDOL (ISOVUE-300) INJECTION 61%
100.0000 mL | Freq: Once | INTRAVENOUS | Status: AC | PRN
  Administered 2019-09-26: 100 mL via INTRAVENOUS

## 2019-09-27 ENCOUNTER — Other Ambulatory Visit: Payer: Self-pay | Admitting: Internal Medicine

## 2019-09-27 DIAGNOSIS — K6389 Other specified diseases of intestine: Secondary | ICD-10-CM

## 2019-09-28 ENCOUNTER — Other Ambulatory Visit: Payer: Self-pay | Admitting: Internal Medicine

## 2019-09-28 ENCOUNTER — Other Ambulatory Visit: Payer: Self-pay

## 2019-09-28 ENCOUNTER — Ambulatory Visit: Payer: Medicare Other | Admitting: Gastroenterology

## 2019-09-28 ENCOUNTER — Encounter: Payer: Self-pay | Admitting: Gastroenterology

## 2019-09-28 VITALS — BP 102/58 | HR 95 | Temp 97.3°F | Ht 63.0 in | Wt 155.5 lb

## 2019-09-28 DIAGNOSIS — C180 Malignant neoplasm of cecum: Secondary | ICD-10-CM | POA: Insufficient documentation

## 2019-09-28 DIAGNOSIS — Z85038 Personal history of other malignant neoplasm of large intestine: Secondary | ICD-10-CM | POA: Insufficient documentation

## 2019-09-28 DIAGNOSIS — D649 Anemia, unspecified: Secondary | ICD-10-CM | POA: Insufficient documentation

## 2019-09-28 DIAGNOSIS — R935 Abnormal findings on diagnostic imaging of other abdominal regions, including retroperitoneum: Secondary | ICD-10-CM

## 2019-09-28 HISTORY — DX: Malignant neoplasm of cecum: C18.0

## 2019-09-28 MED ORDER — SUPREP BOWEL PREP KIT 17.5-3.13-1.6 GM/177ML PO SOLN: 1.0000 | ORAL | 0 refills | Status: DC

## 2019-09-28 NOTE — Progress Notes (Signed)
09/28/2019 Jamie [name] [id number] 03-07-1933   HISTORY OF PRESENT ILLNESS: This is a pleasant 85 year old female who is a patient previously of Dr. Nichola Sizer.  She has not been seen here since 2009 when she had her last colonoscopy.  She has had multiple colonoscopies over the years dating back to 64.  At the time of her last colonoscopy in 2009 she had a couple of polyps removed, one being benign colonic mucosa and the other being hyperplastic polyp.  Otherwise had colonic diverticulosis as well.  She is here today at the request of her PCP, Dr. Sharlet Salina, in order to discuss colonoscopy.  Apparently she was seen by her PCP with complaints of abdominal discomfort and gas pain.  CT scan of the abdomen pelvis with contrast showed a large cecal mass concerning for malignancy.  Hemoglobin was 10.8 g as compared to last year in October when it was 12.4 g.  She denies any sign of bleeding.  She says she gets occasionally constipated, but for the most part moves her bowels regularly.  She denies any pain currently.  She says it is more of a gas discomfort that she was having, not a sharp pain.   Past Medical History:  Diagnosis Date  . ANEMIA, IRON DEFICIENCY 09/10/2007  . CATARACT EXTRACTION, HX OF 09/10/2007  . DYSPEPSIA, HX OF 09/10/2007  . HAIR LOSS 03/21/2009  . HAY FEVER 09/10/2007  . HEARING LOSS, SENSORINEURAL 09/10/2007  . HYPERLIPIDEMIA 09/10/2007  . HYPERTENSION 09/10/2007  . LOW BACK PAIN 05/10/2008  . Pain in joint, hand 03/01/2008  . PEPTIC ULCER DISEASE 09/10/2007  . POLYP, COLON 09/10/2007  . POLYPECTOMY, HX OF 09/10/2007  . SHINGLES 08/13/2009  . SHOULDER PAIN, LEFT 02/12/2008  . TAH/BSO, HX OF 09/10/2007   Past Surgical History:  Procedure Laterality Date  . CATARACT EXTRACTION    . IRRIGATION AND DEBRIDEMENT SEBACEOUS CYST    . POLYPECTOMY    . TOTAL ABDOMINAL HYSTERECTOMY W/ BILATERAL SALPINGOOPHORECTOMY      reports that she quit smoking about 29 years ago.  Her smoking use included cigarettes. She has a 5.00 pack-year smoking history. She has never used smokeless tobacco. She reports that she does not drink alcohol or use drugs. family history includes Coronary artery disease in her mother; Heart disease in her mother; Hypertension in her mother. Allergies  Allergen Reactions  . Aspirin     Stomach upset  . Latex Itching and Rash      Outpatient Encounter Medications as of 09/28/2019  Medication Sig  . ALPRAZolam (XANAX) 0.5 MG tablet TAKE 1 TABLET BY MOUTH EVERY DAY AS NEEDED FOR ANXIETY  . alum & mag hydroxide-simeth (MYLANTA) [id number] MG/5ML suspension Take 15 mLs by mouth every 6 (six) hours as needed for indigestion or heartburn.  Marland Kitchen amLODipine (NORVASC) 5 MG tablet Take 1 tablet (5 mg total) by mouth daily.  Marland Kitchen atorvastatin (LIPITOR) 20 MG tablet TAKE 1 TABLET (20 MG TOTAL) BY MOUTH DAILY AT 6 PM. NEED VISIT WITH LABS FOR FURTHER REFILLS  . BREO ELLIPTA 100-25 MCG/INH AEPB INHALE 1 PUFF BY MOUTH EVERY DAY  . famotidine (PEPCID) 20 MG tablet Take 1 tablet (20 mg total) by mouth 2 (two) times daily.  . fluticasone (FLONASE) 50 MCG/ACT nasal spray Place 2 sprays into both nostrils daily.  Marland Kitchen olmesartan (BENICAR) 40 MG tablet TAKE 1 TABLET BY MOUTH EVERY DAY  . ondansetron (ZOFRAN-ODT) 4 MG disintegrating tablet TAKE 1 TABLET BY MOUTH EVERY 8 HOURS AS  NEEDED FOR NAUSEA AND VOMITING  . PROAIR HFA 108 (90 Base) MCG/ACT inhaler INHALE 2 PUFFS INTO THE LUNGS EVERY 4 (FOUR) HOURS AS NEEDED FOR WHEEZING OR SHORTNESS OF BREATH.  . triamcinolone cream (KENALOG) 0.1 % Apply 1 application topically 2 (two) times daily.  . [DISCONTINUED] furosemide (LASIX) 40 MG tablet TAKE 1 TABLET BY MOUTH EVERY DAY   No facility-administered encounter medications on file as of 09/28/2019.      REVIEW OF SYSTEMS  : All other systems reviewed and negative except where noted in the History of Present Illness.   PHYSICAL EXAM: BP (!) 102/58 (BP Location: Left Arm,  Patient Position: Sitting, Cuff Size: Normal)   Pulse 95   Temp (!) 97.3 F (36.3 C) (Other (Comment))   Ht 5\' 3"  (1.6 m)   Wt 155 lb 8 oz (70.5 kg)   BMI 27.55 kg/m  General: Well developed black female in no acute distress Head: Normocephalic and atraumatic Eyes:  Sclerae anicteric, conjunctiva pink. Ears: Normal auditory acuity Lungs: Clear throughout to auscultation; no increased WOB. Heart: Regular rate and rhythm; no M/R/G. Abdomen: Soft, non-distended.  BS present.  Non-tender. Rectal:  Will be done at the time of colonoscopy. Musculoskeletal: Symmetrical with no gross deformities  Skin: No lesions on visible extremities Extremities: No edema  Neurological: Alert oriented x 4, grossly non-focal Psychological:  Alert and cooperative. Normal mood and affect  ASSESSMENT AND PLAN: *Anormal CT scan showing concern for malignancy in the cecum.  Last colonoscopy in 2009.  Will plan for colonoscopy with Dr. Carlean Purl. *Normocytic anemia:  Hgb down along 2 grams from a year ago.  Likely related to above.  No sign of overt GI bleeding.  **The risks, benefits, and alternatives colonoscopy towere discussed with the patient and she consents to proceed.   CC:  Hoyt Koch, *

## 2019-09-28 NOTE — Patient Instructions (Signed)
If you are age 83 or older, your body mass index should be between 23-30. Your Body mass index is 27.55 kg/m. If this is out of the aforementioned range listed, please consider follow up with your Primary Care Provider.  If you are age 40 or younger, your body mass index should be between 19-25. Your Body mass index is 27.55 kg/m. If this is out of the aformentioned range listed, please consider follow up with your Primary Care Provider.   You have been scheduled for a colonoscopy. Please follow written instructions given to you at your visit today.  Please pick up your prep supplies at the pharmacy within the next 1-3 days. If you use inhalers (even only as needed), please bring them with you on the day of your procedure.  Thank you for choosing me and [facility].  Janett Billow Zehr-PA

## 2019-09-30 ENCOUNTER — Inpatient Hospital Stay (HOSPITAL_COMMUNITY)
Admission: RE | Admit: 2019-09-30 | Discharge: 2019-09-30 | Disposition: A | Discharge status: Home / Self Care | Payer: Medicare Other | Source: Ambulatory Visit

## 2019-09-30 ENCOUNTER — Telehealth: Payer: Self-pay | Admitting: Internal Medicine

## 2019-09-30 ENCOUNTER — Encounter: Payer: Self-pay | Admitting: Internal Medicine

## 2019-09-30 LAB — SARS CORONAVIRUS 2 (TAT 6-24 HRS): SARS Coronavirus 2: NEGATIVE

## 2019-09-30 NOTE — Telephone Encounter (Signed)
All questions answered.  She is reassured that she will be okay taking her BP meds while on liquids.  She thanked me for the call

## 2019-09-30 NOTE — Progress Notes (Signed)
Contacted patient due to missed covid appointment, unable to leave a message

## 2019-10-04 ENCOUNTER — Encounter: Payer: Self-pay | Admitting: Internal Medicine

## 2019-10-04 ENCOUNTER — Ambulatory Visit (AMBULATORY_SURGERY_CENTER): Payer: Medicare Other | Admitting: Internal Medicine

## 2019-10-04 ENCOUNTER — Other Ambulatory Visit (INDEPENDENT_AMBULATORY_CARE_PROVIDER_SITE_OTHER): Payer: Medicare Other

## 2019-10-04 ENCOUNTER — Other Ambulatory Visit: Payer: Self-pay

## 2019-10-04 VITALS — BP 107/54 | HR 61 | Temp 98.3°F | Resp 18 | Ht 63.0 in | Wt 155.0 lb

## 2019-10-04 DIAGNOSIS — C180 Malignant neoplasm of cecum: Secondary | ICD-10-CM

## 2019-10-04 DIAGNOSIS — R935 Abnormal findings on diagnostic imaging of other abdominal regions, including retroperitoneum: Secondary | ICD-10-CM

## 2019-10-04 DIAGNOSIS — K6389 Other specified diseases of intestine: Secondary | ICD-10-CM

## 2019-10-04 LAB — COMPREHENSIVE METABOLIC PANEL
ALT: 10 U/L (ref 0–35)
AST: 13 U/L (ref 0–37)
Albumin: 4 g/dL (ref 3.5–5.2)
Alkaline Phosphatase: 186 U/L — ABNORMAL HIGH (ref 39–117)
BUN: 7 mg/dL (ref 6–23)
CO2: 22 mEq/L (ref 19–32)
Calcium: 8.9 mg/dL (ref 8.4–10.5)
Chloride: 107 mEq/L (ref 96–112)
Creatinine, Ser: 0.72 mg/dL (ref 0.40–1.20)
GFR: 93.24 mL/min (ref >60.00)
Glucose, Bld: 86 mg/dL (ref 70–99)
Potassium: 3.7 mEq/L (ref 3.5–5.1)
Sodium: 141 mEq/L (ref 135–145)
Total Bilirubin: 0.6 mg/dL (ref 0.2–1.2)
Total Protein: 7.4 g/dL (ref 6.0–8.3)

## 2019-10-04 LAB — CBC
HCT: 35.2 % — ABNORMAL LOW (ref 36.0–46.0)
Hemoglobin: 11.3 g/dL — ABNORMAL LOW (ref 12.0–15.0)
MCHC: 32 g/dL (ref 30.0–36.0)
MCV: 89.4 fl (ref 78.0–100.0)
Platelets: 254 10*3/uL (ref 150.0–400.0)
RBC: 3.94 Mil/uL (ref 3.87–5.11)
RDW: 15.7 % — ABNORMAL HIGH (ref 11.5–15.5)
WBC: 5.5 10*3/uL (ref 4.0–10.5)

## 2019-10-04 MED ORDER — SODIUM CHLORIDE 0.9 % IV SOLN: 500.0000 mL | Freq: Once | INTRAVENOUS | Status: DC

## 2019-10-04 NOTE — Progress Notes (Signed)
Pt tolerated well. VSS. Awake and to recovery. 

## 2019-10-04 NOTE — Patient Instructions (Addendum)
There was a mass in the colon - and it is unfortunately consistent with a cancer.  I took biopsies and we will get you appointments with cancer specialists and surgeons.  You will also need a CT scan of the chest that we will schedule.  You will have labs today also.  I am sorry for this news but it does not mean it cannot be cured.  I appreciate the opportunity to care for you. Gatha Mayer, MD, [facility]  Diverticulosis handout given to patient.  Resume previous diet. Continue present medications.  Await pathology results.  Refer to oncology.  Refer to Dr.'s Roby Lofts.  YOU HAD AN ENDOSCOPIC PROCEDURE TODAY AT [facility]:   Refer to the procedure report that was given to you for any specific questions about what was found during the examination.  If the procedure report does not answer your questions, please call your gastroenterologist to clarify.  If you requested that your care partner not be given the details of your procedure findings, then the procedure report has been included in a sealed envelope for you to review at your convenience later.  YOU SHOULD EXPECT: Some feelings of bloating in the abdomen. Passage of more gas than usual.  Walking can help get rid of the air that was put into your GI tract during the procedure and reduce the bloating. If you had a lower endoscopy (such as a colonoscopy or flexible sigmoidoscopy) you may notice spotting of blood in your stool or on the toilet paper. If you underwent a bowel prep for your procedure, you may not have a normal bowel movement for a few days.  Please Note:  You might notice some irritation and congestion in your nose or some drainage.  This is from the oxygen used during your procedure.  There is no need for concern and it should clear up in a day or so.  SYMPTOMS TO REPORT IMMEDIATELY:   Following lower endoscopy (colonoscopy or flexible sigmoidoscopy):  Excessive amounts of blood in the  stool  Significant tenderness or worsening of abdominal pains  Swelling of the abdomen that is new, acute  Fever of 100F or higher  For urgent or emergent issues, a gastroenterologist can be reached at any hour by calling [phone].   DIET:  We do recommend a small meal at first, but then you may proceed to your regular diet.  Drink plenty of fluids but you should avoid alcoholic beverages for 24 hours.  ACTIVITY:  You should plan to take it easy for the rest of today and you should NOT DRIVE or use heavy machinery until tomorrow (because of the sedation medicines used during the test).    FOLLOW UP: Our staff will call the number listed on your records 48-72 hours following your procedure to check on you and address any questions or concerns that you may have regarding the information given to you following your procedure. If we do not reach you, we will leave a message.  We will attempt to reach you two times.  During this call, we will ask if you have developed any symptoms of COVID 19. If you develop any symptoms (ie: fever, flu-like symptoms, shortness of breath, cough etc.) before then, please call [phone].  If you test positive for Covid 19 in the 2 weeks post procedure, please call and report this information to [place].    If any biopsies were taken you will be contacted by phone or by  letter within the next 1-3 weeks.  Please call us at [phone] if you have not heard about the biopsies in 3 weeks.    SIGNATURES/CONFIDENTIALITY: You and/or your care partner have signed paperwork which will be entered into your electronic medical record.  These signatures attest to the fact that that the information above on your After Visit Summary has been reviewed and is understood.  Full responsibility of the confidentiality of this discharge information lies with you and/or your care-partner.

## 2019-10-04 NOTE — Progress Notes (Signed)
Jamie [name]

## 2019-10-04 NOTE — Progress Notes (Signed)
Called to room to assist during endoscopic procedure.  Patient ID and intended procedure confirmed with present staff. Received instructions for my participation in the procedure from the performing physician.  

## 2019-10-04 NOTE — Op Note (Signed)
[facility] Patient Name: Jamie [name] Procedure Date: 10/04/2019 7:53 AM MRN: [id number] Endoscopist: Gatha Mayer , MD Age: 85 Referring MD:  Date of Birth: 1933/06/29 Gender: Female Account #: [id number] Procedure:                Colonoscopy Indications:              Abnormal CT of the GI tract Medicines:                Propofol per Anesthesia, Monitored Anesthesia Care Procedure:                Pre-Anesthesia Assessment:                           - Prior to the procedure, a History and Physical                            was performed, and patient medications and                            allergies were reviewed. The patient's tolerance of                            previous anesthesia was also reviewed. The risks                            and benefits of the procedure and the sedation                            options and risks were discussed with the patient.                            All questions were answered, and informed consent                            was obtained. Prior Anticoagulants: The patient has                            taken no previous anticoagulant or antiplatelet                            agents. ASA Grade Assessment: II - A patient with                            mild systemic disease. After reviewing the risks                            and benefits, the patient was deemed in                            satisfactory condition to undergo the procedure.                           After obtaining informed consent, the colonoscope  was passed under direct vision. Throughout the                            procedure, the patient's blood pressure, pulse, and                            oxygen saturations were monitored continuously. The                            Colonoscope was introduced through the anus and                            advanced to the the cecum, identified by                            appendiceal  orifice and ileocecal valve. The                            colonoscopy was performed without difficulty. The                            patient tolerated the procedure well. The quality                            of the bowel preparation was excellent. The                            ileocecal valve, appendiceal orifice, and rectum                            were photographed. Scope In: 8:15:27 AM Scope Out: 8:23:47 AM Scope Withdrawal Time: 0 hours 5 minutes 6 seconds  Total Procedure Duration: 0 hours 8 minutes 20 seconds  Findings:                 The perianal and digital rectal examinations were                            normal.                           A fungating, infiltrative, polypoid and ulcerated                            large mass was found in the cecum. Biopsies were                            taken with a cold forceps for histology.                            Verification of patient identification for the                            specimen was done. Estimated blood loss was minimal.  Multiple diverticula were found in the sigmoid                            colon.                           The exam was otherwise without abnormality on                            direct and retroflexion views. Complications:            No immediate complications. Estimated Blood Loss:     Estimated blood loss was minimal. Impression:               - Malignant tumor in the cecum. Biopsied.                           - Diverticulosis in the sigmoid colon.                           - The examination was otherwise normal on direct                            and retroflexion views. Recommendation:           - Patient has a contact number available for                            emergencies. The signs and symptoms of potential                            delayed complications were discussed with the                            patient. Return to normal activities tomorrow.                             Written discharge instructions were provided to the                            patient.                           - Resume previous diet.                           - Continue present medications.                           - Await pathology results.                           - Refer to oncology                           Refer to Drs Roby Lofts  Labs today (ordered) CBC, CMET CEA                           schedule CT chest Gatha Mayer, MD 10/04/2019 8:38:41 AM This report has been signed electronically.

## 2019-10-05 ENCOUNTER — Other Ambulatory Visit: Payer: Self-pay

## 2019-10-05 ENCOUNTER — Telehealth: Payer: Self-pay

## 2019-10-05 DIAGNOSIS — R935 Abnormal findings on diagnostic imaging of other abdominal regions, including retroperitoneum: Secondary | ICD-10-CM

## 2019-10-05 DIAGNOSIS — K6389 Other specified diseases of intestine: Secondary | ICD-10-CM

## 2019-10-05 LAB — CEA: CEA: 5.1 ng/mL — ABNORMAL HIGH

## 2019-10-05 NOTE — Telephone Encounter (Signed)
Patient notified of the CT chest scheduled for 10/10/19 10/10/19 1:30.  She is asked to arrive at 1:15 and have no solids for 4 hours prior.  She is notified she will be contacted directly by Oncology and Surgery with appt date and times.

## 2019-10-06 ENCOUNTER — Telehealth: Payer: Self-pay | Admitting: *Deleted

## 2019-10-06 NOTE — Telephone Encounter (Signed)
  Follow up Call-  Call back number 10/04/2019  Post procedure Call Back phone  # [phone]  Permission to leave phone message Yes  Some recent data might be hidden     Patient questions:  Do you have a fever, pain , or abdominal swelling? No. Pain Score  0 *  Have you tolerated food without any problems? Yes.    Have you been able to return to your normal activities? Yes.    Do you have any questions about your discharge instructions: Diet   No. Medications  No. Follow up visit  No.  Do you have questions or concerns about your Care? No.  Actions: * If pain score is 4 or above: No action needed, pain <4.  1. Have you developed a fever since your procedure? no  2.   Have you had an respiratory symptoms (SOB or cough) since your procedure? no  3.   Have you tested positive for COVID 19 since your procedure no  4.   Have you had any family members/close contacts diagnosed with the COVID 19 since your procedure?  no   If yes to any of these questions please route to Joylene John, RN and Alphonsa Gin, Therapist, sports.

## 2019-10-07 ENCOUNTER — Encounter: Payer: Self-pay | Admitting: Internal Medicine

## 2019-10-07 ENCOUNTER — Telehealth: Payer: Self-pay | Admitting: Hematology

## 2019-10-07 NOTE — Telephone Encounter (Signed)
A new patient appt has been scheduled for the pt to see Dr. Burr Medico on 11/16 at 3pm. Jamie [name] has been made aware to arrive 15 minutes early.

## 2019-10-10 ENCOUNTER — Other Ambulatory Visit: Payer: Self-pay

## 2019-10-10 ENCOUNTER — Encounter: Payer: Self-pay | Admitting: Internal Medicine

## 2019-10-10 ENCOUNTER — Ambulatory Visit (INDEPENDENT_AMBULATORY_CARE_PROVIDER_SITE_OTHER)
Admission: RE | Admit: 2019-10-10 | Discharge: 2019-10-10 | Disposition: A | Discharge status: Home / Self Care | Payer: Medicare Other | Source: Ambulatory Visit | Attending: Internal Medicine | Admitting: Internal Medicine

## 2019-10-10 DIAGNOSIS — I2699 Other pulmonary embolism without acute cor pulmonale: Secondary | ICD-10-CM | POA: Insufficient documentation

## 2019-10-10 DIAGNOSIS — R935 Abnormal findings on diagnostic imaging of other abdominal regions, including retroperitoneum: Secondary | ICD-10-CM | POA: Diagnosis not present

## 2019-10-10 DIAGNOSIS — K6389 Other specified diseases of intestine: Secondary | ICD-10-CM

## 2019-10-10 HISTORY — DX: Other pulmonary embolism without acute cor pulmonale: I26.99

## 2019-10-10 MED ORDER — IOHEXOL 300 MG/ML  SOLN
80.0000 mL | Freq: Once | INTRAMUSCULAR | Status: AC | PRN
  Administered 2019-10-10: 80 mL via INTRAVENOUS

## 2019-10-10 NOTE — Progress Notes (Signed)
Jamie [name],  Please let her know no signs of cancer on this test - good news  Does have 1 or 2 tiny blood clots in lung that are not causing problems. She cannot take a blood thinner due to the tumor - cancer.   She needs bilateral lower extremity Doppler exam to R/O DVT given pulmonary emboli If she gets new shortness of breath, chest pains etc call back or go to ED  Am ccing PCP Dr. Sharlet Salina (appt 11/13) and Dr. Burr Medico of oncology (sees her 11/16)

## 2019-10-11 ENCOUNTER — Other Ambulatory Visit: Payer: Self-pay

## 2019-10-11 DIAGNOSIS — I2699 Other pulmonary embolism without acute cor pulmonale: Secondary | ICD-10-CM

## 2019-10-12 ENCOUNTER — Ambulatory Visit (HOSPITAL_COMMUNITY)
Admission: RE | Admit: 2019-10-12 | Discharge: 2019-10-12 | Disposition: A | Discharge status: Home / Self Care | Payer: Medicare Other | Source: Ambulatory Visit | Attending: Internal Medicine | Admitting: Internal Medicine

## 2019-10-12 ENCOUNTER — Other Ambulatory Visit: Payer: Self-pay

## 2019-10-12 DIAGNOSIS — I2699 Other pulmonary embolism without acute cor pulmonale: Secondary | ICD-10-CM | POA: Insufficient documentation

## 2019-10-12 NOTE — Progress Notes (Signed)
[place]   Telephone:([phone] Fax:([phone]) [place] Note   Patient Care Team: Hoyt Koch, MD as PCP - General (Internal Medicine) Karl Luke, MD as Referring Physician (Optometry)  Date of Service:  10/17/2019   CHIEF COMPLAINTS/PURPOSE OF CONSULTATION:  Newly diagnosed right colon cancer   REFERRING PHYSICIAN:  Dr. Carlean Purl   Oncology History Overview Note  Cancer Staging Adenocarcinoma of cecum [facility]) Staging form: Colon and Rectum, AJCC 8th Edition - Clinical: Stage Unknown (cTX, cN0, cM0) - Signed by Truitt Merle, MD on 10/17/2019    Adenocarcinoma of cecum ([place])  09/28/2019 Initial Diagnosis   Adenocarcinoma of cecum ([place])   10/04/2019 Pathology Results   Cecal mass biopsy showed adenocarcinoma   10/04/2019 Procedure   -Malignant tumor in the cecum. Biopsied. - Diverticulosis in the sigmoid colon. - The examination was otherwise normal   10/04/2019 Miscellaneous   MMR showed loss of MLH1 and PMS2 expression    10/17/2019 Cancer Staging   Staging form: Colon and Rectum, AJCC 8th Edition - Clinical: Stage Unknown (cTX, cN0, cM0) - Signed by Truitt Merle, MD on 10/17/2019      HISTORY OF PRESENTING ILLNESS:  Jamie [name] 85 y.o. female is a here because of her newly diagnosed right colon cancer. The patient was referred by her gastroenterologist Dr. Carlean Purl. The patient presents to the clinic today accompanied by her daughter.  She presented to her primary care physician Dr. Sharlet Salina on October 16, 2019 with 4 to 5 months history of abdominal bloating, gassy feeling.  She denies change of her bowel habits, she has occasional constipation, no hematochezia or no nausea or vomiting.  Dr. Sharlet Salina ordered a CT of abdomen pelvis, which unfortunately revealed a large mass in the cecum, she was referred to gastroenterologist Dr. Lilyan Punt and underwent colonoscopy on October 04, 2019.  The colonoscopy showed a large  fungating, ulcerated mass in the cecum biopsy showed adenocarcinoma.  She was referred to [place] for further management.  She is scheduled to see colorectal surgeon Dr. Dema Severin tomorrow..  Restaging CT scan was done, which was negative for metastatic disease, but did show a small PE. She is not on anticoagulation.   Her appetite was normal until her diagnosis of cancer, she has been quite anxious, and has lost about 10 lbs lately.  She denies any chest discomfort, no dyspnea, no cough.  Her energy level has been normal, she functions very well at home.   REVIEW OF SYSTEMS:    Constitutional: Denies fevers, chills or abnormal night sweats, (+) 10 pound weight loss lately Eyes: Denies blurriness of vision, double vision or watery eyes Ears, nose, mouth, throat, and face: Denies mucositis or sore throat Respiratory: Denies cough, dyspnea or wheezes Cardiovascular: Denies palpitation, chest discomfort or lower extremity swelling Gastrointestinal:  See HPI  Skin: Denies abnormal skin rashes Lymphatics: Denies new lymphadenopathy or easy bruising Neurological:Denies numbness, tingling or new weaknesses Behavioral/Psych: Mood is stable, no new changes  All other systems were reviewed with the patient and are negative.   MEDICAL HISTORY:  Past Medical History:  Diagnosis Date  . Adenocarcinoma of cecum ([place]) 09/28/2019  . ANEMIA, IRON DEFICIENCY 09/10/2007  . Anxiety   . Arthritis   . Asthma   . Blood transfusion without reported diagnosis   . Cataract 2008   bilateral cataract extraction  . CATARACT EXTRACTION, HX OF 09/10/2007  . DYSPEPSIA, HX OF 09/10/2007  . GERD (gastroesophageal reflux disease)   .  HAIR LOSS 03/21/2009  . HAY FEVER 09/10/2007  . HEARING LOSS, SENSORINEURAL 09/10/2007  . HYPERLIPIDEMIA 09/10/2007  . HYPERTENSION 09/10/2007  . LOW BACK PAIN 05/10/2008  . Pain in joint, hand 03/01/2008  . PEPTIC ULCER DISEASE 09/10/2007  . POLYP, COLON 09/10/2007  . POLYPECTOMY, HX OF  09/10/2007  . Pulmonary embolism ([place]) 1- 2 non-occlusive 10/10/2019  . SHINGLES 08/13/2009  . SHOULDER PAIN, LEFT 02/12/2008  . TAH/BSO, HX OF 09/10/2007  . Tuberculosis     SURGICAL HISTORY: Past Surgical History:  Procedure Laterality Date  . CATARACT EXTRACTION    . COLONOSCOPY    . IRRIGATION AND DEBRIDEMENT SEBACEOUS CYST    . TOTAL ABDOMINAL HYSTERECTOMY W/ BILATERAL SALPINGOOPHORECTOMY      SOCIAL HISTORY: Social History   Socioeconomic History  . Marital status: Widowed    Spouse name: Not on file  . Number of children: 4  . Years of education: Not on file  . Highest education level: Not on file  Occupational History  . Occupation: retired  Scientific laboratory technician  . Financial resource strain: Not hard at all  . Food insecurity    Worry: Never true    Inability: Never true  . Transportation needs    Medical: No    Non-medical: No  Tobacco Use  . Smoking status: Former Smoker    Packs/day: 0.20    Years: 25.00    Pack years: 5.00    Types: Cigarettes    Quit date: 12/01/1989    Years since quitting: 29.8  . Smokeless tobacco: Never Used  . Tobacco comment: smoked a long time ago; Quit 90 or 91/ number is approximate  Substance and Sexual Activity  . Alcohol use: No    Alcohol/week: 0.0 standard drinks  . Drug use: No  . Sexual activity: Not Currently  Lifestyle  . Physical activity    Days per week: 0 days    Minutes per session: 0 min  . Stress: Not at all  Relationships  . Social connections    Talks on phone: More than three times a week    Gets together: More than three times a week    Attends religious service: 1 to 4 times per year    Active member of club or organization: Yes    Attends meetings of clubs or organizations: 1 to 4 times per year    Relationship status: Widowed  . Intimate partner violence    Fear of current or ex partner: Not on file    Emotionally abused: Not on file    Physically abused: Not on file    Forced sexual activity: Not on  file  Other Topics Concern  . Not on file  Social History Narrative   3 sons, 76,59, 80 and 1 daughter 68, 4 grandchildren   SO with multiple medical problems    FAMILY HISTORY: Family History  Problem Relation Age of Onset  . Hypertension Mother   . Coronary artery disease Mother   . Heart disease Mother        CHF  . Cancer Daughter 69       prostate cancer   . Cancer Son 8       prostate cancer   . Diabetes Neg Hx     ALLERGIES:  is allergic to aspirin and latex.  MEDICATIONS:  Current Outpatient Medications  Medication Sig Dispense Refill  . acetaminophen (TYLENOL) 500 MG tablet Take 500 mg by mouth every 6 (six) hours as needed.    Marland Kitchen  ALPRAZolam (XANAX) 0.5 MG tablet TAKE 1 TABLET BY MOUTH EVERY DAY AS NEEDED FOR ANXIETY 20 tablet 2  . alum & mag hydroxide-simeth (MYLANTA) 831-517-61 MG/5ML suspension Take 15 mLs by mouth every 6 (six) hours as needed for indigestion or heartburn.    Marland Kitchen amLODipine (NORVASC) 5 MG tablet Take 1 tablet (5 mg total) by mouth daily. 90 tablet 1  . atorvastatin (LIPITOR) 20 MG tablet TAKE 1 TABLET (20 MG TOTAL) BY MOUTH DAILY AT 6 PM. NEED VISIT WITH LABS FOR FURTHER REFILLS 90 tablet 1  . BREO ELLIPTA 100-25 MCG/INH AEPB INHALE 1 PUFF BY MOUTH EVERY DAY 60 each 3  . famotidine (PEPCID) 20 MG tablet Take 1 tablet (20 mg total) by mouth 2 (two) times daily. 180 tablet 3  . fluticasone (FLONASE) 50 MCG/ACT nasal spray Place 2 sprays into both nostrils daily. 16 g 6  . furosemide (LASIX) 40 MG tablet TAKE 1 TABLET BY MOUTH EVERY DAY 90 tablet 1  . olmesartan (BENICAR) 40 MG tablet TAKE 1 TABLET BY MOUTH EVERY DAY 90 tablet 3  . ondansetron (ZOFRAN-ODT) 4 MG disintegrating tablet TAKE 1 TABLET BY MOUTH EVERY 8 HOURS AS NEEDED FOR NAUSEA AND VOMITING 15 tablet 0  . PROAIR HFA 108 (90 Base) MCG/ACT inhaler INHALE 2 PUFFS INTO THE LUNGS EVERY 4 (FOUR) HOURS AS NEEDED FOR WHEEZING OR SHORTNESS OF BREATH. 25.5 g 3  . triamcinolone cream (KENALOG) 0.1 %  Apply 1 application topically 2 (two) times daily. 100 g 3  . apixaban (ELIQUIS) 2.5 MG TABS tablet Take 1 tablet (2.5 mg total) by mouth 2 (two) times daily. 74 tablet 0   No current facility-administered medications for this visit.     PHYSICAL EXAMINATION: ECOG PERFORMANCE STATUS: 1 - Symptomatic but completely ambulatory  Vitals:   10/17/19 1509  BP: (!) 125/96  Pulse: 96  Resp: 17  Temp: 98.2 F (36.8 C)  SpO2: 100%   Filed Weights   10/17/19 1509  Weight: 155 lb 1.6 oz (70.4 kg)   GENERAL:alert, no distress and comfortable SKIN: skin color, texture, turgor are normal, no rashes or significant lesions EYES: normal, Conjunctiva are pink and non-injected, sclera clear NECK: supple, thyroid normal size, non-tender, without nodularity LYMPH:  no palpable lymphadenopathy in the cervical, axillary  LUNGS: clear to auscultation and percussion with normal breathing effort HEART: regular rate & rhythm and no murmurs and no lower extremity edema ABDOMEN:abdomen soft, non-tender and normal bowel sounds, no organomegaly, no palpable mass. Musculoskeletal:no cyanosis of digits and no clubbing  NEURO: alert & oriented x 3 with fluent speech, no focal motor/sensory deficits  LABORATORY DATA:  I have reviewed the data as listed CBC Latest Ref Rng & Units 10/04/2019 09/15/2019 09/01/2018  WBC 4.0 - 10.5 K/uL 5.5 6.6 5.3  Hemoglobin 12.0 - 15.0 g/dL 11.3(L) 10.8(L) 12.4  Hematocrit 36.0 - 46.0 % 35.2(L) 33.1(L) 36.4  Platelets 150.0 - 400.0 K/uL 254.0 276.0 211.0    CMP Latest Ref Rng & Units 10/04/2019 09/15/2019 09/01/2018  Glucose 70 - 99 mg/dL 86 88 92  BUN 6 - 23 mg/dL '7 8 11  '$ Creatinine 0.40 - 1.20 mg/dL 0.72 0.78 0.88  Sodium 135 - 145 mEq/L 141 141 142  Potassium 3.5 - 5.1 mEq/L 3.7 3.7 3.5  Chloride 96 - 112 mEq/L 107 109 107  CO2 19 - 32 mEq/L '22 23 27  '$ Calcium 8.4 - 10.5 mg/dL 8.9 9.1 9.3  Total Protein 6.0 - 8.3 g/dL 7.4 7.4 7.4  Total Bilirubin 0.2 - 1.2 mg/dL 0.6 0.7  0.7  Alkaline Phos 39 - 117 U/L 186(H) 191(H) 178(H)  AST 0 - 37 U/L '13 15 15  '$ ALT 0 - 35 U/L '10 8 9     '$ RADIOGRAPHIC STUDIES: I have personally reviewed the radiological images as listed and agreed with the findings in the report. Ct Chest W Contrast  Result Date: 10/10/2019 CLINICAL DATA:  Cecal mass.  Staging. EXAM: CT CHEST WITH CONTRAST TECHNIQUE: Multidetector CT imaging of the chest was performed during intravenous contrast administration. CONTRAST:  57m OMNIPAQUE IOHEXOL 300 MG/ML  SOLN COMPARISON:  Abdomen/pelvis CT 09/26/2019. FINDINGS: Cardiovascular: The heart size is normal. No substantial pericardial effusion. Coronary artery calcification is evident. Atherosclerotic calcification is noted in the wall of the thoracic aorta. Nonocclusive filling defect identified in segmental pulmonary artery to the left upper lobe. String like nonocclusive embolus identified in segmental and subsegmental branches to the left lower lobe. There may be trace nonobstructive embolus in subsegmental artery to the right lower lobe (94/2). Mediastinum/Nodes: No mediastinal lymphadenopathy. There is no hilar lymphadenopathy. The esophagus has normal imaging features. There is no axillary lymphadenopathy. Lungs/Pleura: No suspicious pulmonary nodule or mass. Tiny probable granuloma in the left lower lobe on 80/3. No focal airspace consolidation. No pleural effusion. Upper Abdomen: Hepatic cysts again noted.  Renal cysts evident. Musculoskeletal: No worrisome lytic or sclerotic osseous abnormality. IMPRESSION: 1. Small volume nonocclusive pulmonary embolus identified in segmental left upper lobe pulmonary artery and subsegmental pulmonary artery to the left lower lobe. Left lower lobe embolus has a string like configuration suggesting that these findings may well be subacute to chronic. Probable trace nonobstructive embolus in a subsegmental artery to the right lower lobe. 2. No evidence for metastatic disease in the  thorax. 3.  Aortic Atherosclerois (ICD10-170.0) These results will be called to the ordering clinician or representative by the Radiologist Assistant, and communication documented in the PACS or zVision Dashboard. Electronically Signed   By: EMisty StanleyM.D.   On: 10/10/2019 14:29   Ct Abdomen Pelvis W Contrast  Result Date: 09/26/2019 CLINICAL DATA:  Acute generalized abdominal pain. EXAM: CT ABDOMEN AND PELVIS WITH CONTRAST TECHNIQUE: Multidetector CT imaging of the abdomen and pelvis was performed using the standard protocol following bolus administration of intravenous contrast. CONTRAST:  1068mISOVUE-300 IOPAMIDOL (ISOVUE-300) INJECTION 61% COMPARISON:  October 23, 2015. FINDINGS: Lower chest: No acute abnormality. Hepatobiliary: No gallstones or biliary dilatation is noted. Stable hepatic cysts. Pancreas: Unremarkable. No pancreatic ductal dilatation or surrounding inflammatory changes. Spleen: Normal in size without focal abnormality. Adrenals/Urinary Tract: Adrenal glands appear normal. Stable bilateral renal cysts are noted. No hydronephrosis or renal obstruction is noted. Urinary bladder is unremarkable. No renal or ureteral calculi are noted. Stomach/Bowel: The stomach appears normal. The appendix appears normal. There is no evidence of bowel obstruction or inflammation. However, multilobulated mass is noted in the cecum highly concerning for colonic malignancy. Vascular/Lymphatic: Aortic atherosclerosis. No enlarged abdominal or pelvic lymph nodes. Reproductive: Status post hysterectomy. No adnexal masses. Other: No abdominal wall hernia or abnormality. No abdominopelvic ascites. Musculoskeletal: No acute or significant osseous findings. IMPRESSION: Large cecal mass is noted concerning for malignancy. Colonoscopy is recommended for further evaluation. These results will be called to the ordering clinician or representative by the Radiologist Assistant, and communication documented in the PACS or  zVision Dashboard. Aortic Atherosclerosis (ICD10-I70.0). Electronically Signed   By: JaMarijo Conception.D.   On: 09/26/2019 16:21   Vas [place]  Extremity Venous (dvt)  Result Date: 10/13/2019  Lower Venous Study Indications: Pulmonary embolism.  Comparison Study: no prior Performing Technologist: June Leap RDMS, RVT  Examination Guidelines: A complete evaluation includes B-mode imaging, spectral Doppler, color Doppler, and power Doppler as needed of all accessible portions of each vessel. Bilateral testing is considered an integral part of a complete examination. Limited examinations for reoccurring indications may be performed as noted.  +---------+---------------+---------+-----------+----------+--------------+ RIGHT    CompressibilityPhasicitySpontaneityPropertiesThrombus Aging +---------+---------------+---------+-----------+----------+--------------+ CFV      Full           Yes      Yes                                 +---------+---------------+---------+-----------+----------+--------------+ SFJ      Full                                                        +---------+---------------+---------+-----------+----------+--------------+ FV Prox  Full                                                        +---------+---------------+---------+-----------+----------+--------------+ FV Mid   Full                                                        +---------+---------------+---------+-----------+----------+--------------+ FV DistalFull                                                        +---------+---------------+---------+-----------+----------+--------------+ PFV      Full                                                        +---------+---------------+---------+-----------+----------+--------------+ POP      Full           Yes      Yes                                 +---------+---------------+---------+-----------+----------+--------------+  PTV      Full                                                        +---------+---------------+---------+-----------+----------+--------------+ PERO     Full                                                        +---------+---------------+---------+-----------+----------+--------------+   +---------+---------------+---------+-----------+----------+--------------+  LEFT     CompressibilityPhasicitySpontaneityPropertiesThrombus Aging +---------+---------------+---------+-----------+----------+--------------+ CFV      Full           Yes      Yes                                 +---------+---------------+---------+-----------+----------+--------------+ SFJ      Full                                                        +---------+---------------+---------+-----------+----------+--------------+ FV Prox  Full                                                        +---------+---------------+---------+-----------+----------+--------------+ FV Mid   Full                                                        +---------+---------------+---------+-----------+----------+--------------+ FV DistalFull                                                        +---------+---------------+---------+-----------+----------+--------------+ PFV      Full                                                        +---------+---------------+---------+-----------+----------+--------------+ POP      Full           Yes      Yes                                 +---------+---------------+---------+-----------+----------+--------------+ PTV      Full                                                        +---------+---------------+---------+-----------+----------+--------------+ PERO     Full                                                        +---------+---------------+---------+-----------+----------+--------------+   Left Technical Findings: Left mid calf  hypoechoic area with mixed echoes- possible isolated intramuscular thrombus versus unknown etiology.   Summary: Right: There is no evidence of deep vein thrombosis in the lower extremity. No cystic structure found in the popliteal fossa. Left: There is  no evidence of deep vein thrombosis in the lower extremity. No cystic structure found in the popliteal fossa.  *See table(s) above for measurements and observations. Electronically signed by Curt Jews MD on 10/13/2019 at 6:52:49 AM.    Final     ASSESSMENT & PLAN:  Jamie [name] is a 85 y.o.  Presented with 4-5 months history of abdominal bloating and gassy feeling.  1. Right colon cancer, cTxN0M0, MSI-H -I reviewed with her colonoscopy, CT scan and biopsy findings with patient and her daughter in details.  I personally reviewed her CT scan.  She has biopsy-proven cecal adenocarcinoma, second CT scan was negative for distant metastasis. -I discussed with her that surgery is the only way to cure her colon cancer, she is scheduled to see colorectal surgeon Dr. Dema Severin tomorrow.  Although she is 65, she has excellent performance status, limited medical comorbidities, I would think she is a candidate for surgery. -I discussed the risk of cancer recurrence after surgery, which depends on her staging, especially lymph node metastasis.  I reviewed the role of adjuvant chemotherapy, to reduce risk of recurrence, especially for node positive or T4 disease.  She is elderly, I would not offer intensive adjuvant chemo, but would consider single agent Xeloda if she has high risk disease.  She voiced good understanding. -Her biopsy showed MMR deficient, both MLH1 and PMS2 loss, this is more likely sporadic colon cancer, than Lynch syndrome, will do MLH1 hypermethylation and BRAF mutation test to confirm.   -- I discussed the surveillance plan, which is a physical exam and lab test (including CBC, CMP and CEA) every 3 months for the first 2 years, then every 6-12  months, colonoscopy in one year, and surveilliance CT scan every 6-12 month for up to 5 year.  -I will see her 3-4 weeks after her surgery    2. LUL pulmonary embolism  -this was discussed with her, her staging CT chest from her colon cancer, she was asymptomatic. -Lower extremely Doppler Doppler was negative for DVT -I think her small volume PE is related to her colon cancer.  I recommended anticoagulation with Eliquis. Due to her advanced age, will start her on low dose '5mg'$  bid for 7 days then change to maintenance dose 2.5 mg twice daily.  She is okay to stop 2-3 days before surgery, and restart after surgery. Plan for a total of 3 months. -she agrees  3. Iron deficient anemia -secondary to #1 -I encourage her to take oral iron  -wil check iron study on next visit   4. HTN, GERD -f/u with PCP  5. Genetics  -due to her family history of 2 of her sons, I will check with our genetic testing genetic testing -she is interested if she qualifies.   PLAN:  -she will see Dr. Dema Severin tomorrow and likely proceed with right hemicolectomy soon  -I will start her on Eliquis for her PE, OK to hold 2-3 days before surgery  -I will see her back in 3-4 weeks after surgery   All questions were answered. The patient knows to call the clinic with any problems, questions or concerns. I spent 40 minutes counseling the patient face to face. The total time spent in the appointment was 55 minutes and more than 50% was on counseling.     Truitt Merle, MD 10/17/2019 3:47 PM  I, Joslyn Devon, am acting as scribe for Truitt Merle, MD.   I have reviewed the above documentation for accuracy and completeness, and  I agree with the above.

## 2019-10-13 NOTE — Progress Notes (Signed)
No DVT's (leg blood clots) No new testing or treatment Sees Dr. Burr Medico 11/16 re colon cancer

## 2019-10-14 ENCOUNTER — Ambulatory Visit (INDEPENDENT_AMBULATORY_CARE_PROVIDER_SITE_OTHER): Payer: Medicare Other | Admitting: *Deleted

## 2019-10-14 ENCOUNTER — Telehealth: Payer: Self-pay

## 2019-10-14 DIAGNOSIS — Z0000 Encounter for general adult medical examination without abnormal findings: Secondary | ICD-10-CM | POA: Diagnosis not present

## 2019-10-14 NOTE — Telephone Encounter (Signed)
Called and spoke with patient's son. He will accompany patient to initial consult with Dr. Burr Medico on 11/16.

## 2019-10-14 NOTE — Progress Notes (Addendum)
Subjective:   Jamie [name] is a 85 y.o. female who presents for Medicare Annual (Subsequent) preventive examination. I connected with patient by a telephone and verified that I am speaking with the correct person using two identifiers. Patient stated full name and DOB. Patient gave permission to continue with telephonic visit. Patient's location was at home and Nurse's location was at [place] office. Participants during this visit included patient and nurse.  Review of Systems:   Cardiac Risk Factors include: advanced age (>73men, >54 women);dyslipidemia;hypertension Sleep patterns: feels rested on waking, gets up 0-1 times nightly to void and sleeps 7 hours nightly.    Home Safety/Smoke Alarms: Feels safe in home. Smoke alarms in place.  Living environment; residence and Firearm Safety: 1-story house/ trailer. Live Seat Belt Safety/Bike Helmet: Wears seat belt.     Objective:     Vitals: There were no vitals taken for this visit.  There is no height or weight on file to calculate BMI.  Advanced Directives 10/14/2019 10/12/2018 07/16/2017 10/23/2015 08/17/2015 08/10/2015  Does Patient Have a Medical Advance Directive? No No No No Yes Yes  Does patient want to make changes to medical advance directive? - Yes (ED - Information included in AVS) - - - -  Copy of Healthcare Power of Attorney in Chart? - - - - Yes Yes  Would patient like information on creating a medical advance directive? Yes (ED - Information included in AVS) - - No - patient declined information - -    Tobacco Social History   Tobacco Use  Smoking Status Former Smoker  . Packs/day: 0.20  . Years: 25.00  . Pack years: 5.00  . Types: Cigarettes  . Quit date: 12/01/1989  . Years since quitting: 29.8  Smokeless Tobacco Never Used  Tobacco Comment   smoked a long time ago; Quit 15 or 91/ number is approximate     Counseling given: Not Answered Comment: smoked a long time ago; Quit 2 or 91/ number is  approximate  Past Medical History:  Diagnosis Date  . Adenocarcinoma of cecum ([place]) 09/28/2019  . ANEMIA, IRON DEFICIENCY 09/10/2007  . Anxiety   . Arthritis   . Asthma   . Blood transfusion without reported diagnosis   . Cancer ([place])   . Cataract 2008   bilateral cataract extraction  . CATARACT EXTRACTION, HX OF 09/10/2007  . DYSPEPSIA, HX OF 09/10/2007  . GERD (gastroesophageal reflux disease)   . HAIR LOSS 03/21/2009  . HAY FEVER 09/10/2007  . HEARING LOSS, SENSORINEURAL 09/10/2007  . HYPERLIPIDEMIA 09/10/2007  . HYPERTENSION 09/10/2007  . LOW BACK PAIN 05/10/2008  . Pain in joint, hand 03/01/2008  . PEPTIC ULCER DISEASE 09/10/2007  . POLYP, COLON 09/10/2007  . POLYPECTOMY, HX OF 09/10/2007  . Pulmonary embolism ([place]) 1- 2 non-occlusive 10/10/2019  . SHINGLES 08/13/2009  . SHOULDER PAIN, LEFT 02/12/2008  . TAH/BSO, HX OF 09/10/2007  . Tuberculosis    Past Surgical History:  Procedure Laterality Date  . CATARACT EXTRACTION    . COLONOSCOPY    . IRRIGATION AND DEBRIDEMENT SEBACEOUS CYST    . TOTAL ABDOMINAL HYSTERECTOMY W/ BILATERAL SALPINGOOPHORECTOMY     Family History  Problem Relation Age of Onset  . Hypertension Mother   . Coronary artery disease Mother   . Heart disease Mother        CHF  . Cancer Neg Hx        breast or colon  . Diabetes Neg Hx    Social History  Socioeconomic History  . Marital status: Widowed    Spouse name: Not on file  . Number of children: 4  . Years of education: Not on file  . Highest education level: Not on file  Occupational History  . Occupation: retired  Scientific laboratory technician  . Financial resource strain: Not hard at all  . Food insecurity    Worry: Never true    Inability: Never true  . Transportation needs    Medical: No    Non-medical: No  Tobacco Use  . Smoking status: Former Smoker    Packs/day: 0.20    Years: 25.00    Pack years: 5.00    Types: Cigarettes    Quit date: 12/01/1989    Years since quitting: 29.8  .  Smokeless tobacco: Never Used  . Tobacco comment: smoked a long time ago; Quit 90 or 91/ number is approximate  Substance and Sexual Activity  . Alcohol use: No    Alcohol/week: 0.0 standard drinks  . Drug use: No  . Sexual activity: Not Currently  Lifestyle  . Physical activity    Days per week: 0 days    Minutes per session: 0 min  . Stress: Not at all  Relationships  . Social connections    Talks on phone: More than three times a week    Gets together: More than three times a week    Attends religious service: 1 to 4 times per year    Active member of club or organization: Yes    Attends meetings of clubs or organizations: 1 to 4 times per year    Relationship status: Widowed  Other Topics Concern  . Not on file  Social History Narrative   3 sons, 54,59, 90 and 1 daughter 6, 4 grandchildren   SO with multiple medical problems    Outpatient Encounter Medications as of 10/14/2019  Medication Sig  . acetaminophen (TYLENOL) 500 MG tablet Take 500 mg by mouth every 6 (six) hours as needed.  . ALPRAZolam (XANAX) 0.5 MG tablet TAKE 1 TABLET BY MOUTH EVERY DAY AS NEEDED FOR ANXIETY  . alum & mag hydroxide-simeth (MYLANTA) [id number] MG/5ML suspension Take 15 mLs by mouth every 6 (six) hours as needed for indigestion or heartburn.  Marland Kitchen amLODipine (NORVASC) 5 MG tablet Take 1 tablet (5 mg total) by mouth daily.  Marland Kitchen atorvastatin (LIPITOR) 20 MG tablet TAKE 1 TABLET (20 MG TOTAL) BY MOUTH DAILY AT 6 PM. NEED VISIT WITH LABS FOR FURTHER REFILLS  . BREO ELLIPTA 100-25 MCG/INH AEPB INHALE 1 PUFF BY MOUTH EVERY DAY  . famotidine (PEPCID) 20 MG tablet Take 1 tablet (20 mg total) by mouth 2 (two) times daily.  . fluticasone (FLONASE) 50 MCG/ACT nasal spray Place 2 sprays into both nostrils daily.  . furosemide (LASIX) 40 MG tablet TAKE 1 TABLET BY MOUTH EVERY DAY  . olmesartan (BENICAR) 40 MG tablet TAKE 1 TABLET BY MOUTH EVERY DAY  . ondansetron (ZOFRAN-ODT) 4 MG disintegrating tablet TAKE 1  TABLET BY MOUTH EVERY 8 HOURS AS NEEDED FOR NAUSEA AND VOMITING  . PROAIR HFA 108 (90 Base) MCG/ACT inhaler INHALE 2 PUFFS INTO THE LUNGS EVERY 4 (FOUR) HOURS AS NEEDED FOR WHEEZING OR SHORTNESS OF BREATH.  . triamcinolone cream (KENALOG) 0.1 % Apply 1 application topically 2 (two) times daily. (Patient not taking: Reported on 10/14/2019)   No facility-administered encounter medications on file as of 10/14/2019.     Activities of Daily Living In your present state of health, do  you have any difficulty performing the following activities: 10/14/2019  Hearing? N  Vision? N  Difficulty concentrating or making decisions? N  Walking or climbing stairs? N  Dressing or bathing? N  Doing errands, shopping? N  Preparing Food and eating ? N  Using the Toilet? N  In the past six months, have you accidently leaked urine? N  Do you have problems with loss of bowel control? N  Managing your Medications? N  Managing your Finances? N  Housekeeping or managing your Housekeeping? N  Some recent data might be hidden    Patient Care Team: Hoyt Koch, MD as PCP - General (Internal Medicine) Karl Luke, MD as Referring Physician (Optometry)    Assessment:   This is a routine wellness examination for [place]. Physical assessment deferred to PCP.   Exercise Activities and Dietary recommendations Current Exercise Habits: Home exercise routine, Time (Minutes): 25, Frequency (Times/Week): 3, Weekly Exercise (Minutes/Week): 75, Intensity: Mild, Exercise limited by: orthopedic condition(s)  Diet (meal preparation, eat out, water intake, caffeinated beverages, dairy products, fruits and vegetables): in general, a "healthy" diet  , on average, 1-2 and supplementing with protein shakes, nurse will mail Ensure coupons meals per day Reports poor appetite  Encouraged patient to increase daily water and healthy fluid intake.  Discussed supplementing with Ensure and coupons provided.  Goals     . Patient Stated     Stay as healthy and as independent as possible.        Fall Risk Fall Risk  10/14/2019 10/12/2018 09/17/2017 10/03/2016 08/10/2015  Falls in the past year? 0 0 No No No  Number falls in past yr: 0 - - - -  Injury with Fall? 0 - - - -  Risk for fall due to : - Impaired balance/gait;Impaired mobility - - -  Follow up - Falls prevention discussed - - -  Comment - feels unsteady while in the shower  - - -   Is the patient's home free of loose throw rugs in walkways, pet beds, electrical cords, etc?   yes      Grab bars in the bathroom? yes      Handrails on the stairs?   yes      Adequate lighting?   yes   Depression Screen PHQ 2/9 Scores 10/14/2019 10/12/2018 09/17/2017 10/03/2016  PHQ - 2 Score 0 0 0 0  PHQ- 9 Score - 0 - -     Cognitive Function MMSE - Mini Mental State Exam 10/12/2018 08/10/2015  Not completed: - (No Data)  Orientation to time 5 -  Orientation to Place 5 -  Registration 3 -  Attention/ Calculation 3 -  Recall 3 -  Language- name 2 objects 2 -  Language- repeat 1 -  Language- follow 3 step command 3 -  Language- read & follow direction 1 -  Write a sentence 1 -  Copy design 1 -  Total score 28 -     6CIT Screen 10/14/2019  What Year? 0 points  What month? 0 points  What time? 0 points  Count back from 20 0 points  Months in reverse 0 points  Repeat phrase 2 points  Total Score 2    Immunization History  Administered Date(s) Administered  . Fluad Quad(high Dose 65+) 09/15/2019  . Influenza Split 09/21/2012  . Influenza Whole 11/08/2007, 09/12/2008, 09/11/2009, 09/24/2010  . Influenza, High Dose Seasonal PF 10/11/2013, 08/15/2016, 10/06/2017, 09/01/2018  . Influenza,inj,Quad PF,6+ Mos 08/08/2014, 08/14/2015  . Pneumococcal  Conjugate-13 09/22/2013  . Pneumococcal Polysaccharide-23 03/01/2008  . Td 05/17/2010   Screening Tests Health Maintenance  Topic Date Due  . MAMMOGRAM  10/28/2019  . TETANUS/TDAP  05/17/2020  .  COLONOSCOPY  10/03/2020  . INFLUENZA VACCINE  Completed  . DEXA SCAN  Completed  . PNA vac Low Risk Adult  Completed        Plan:    Reviewed health maintenance screenings with patient today and relevant education, vaccines, and/or referrals were provided.   I have personally reviewed and noted the following in the patient's chart:   . Medical and social history . Use of alcohol, tobacco or illicit drugs  . Current medications and supplements . Functional ability and status . Nutritional status . Physical activity . Advanced directives . List of other physicians . Screenings to include cognitive, depression, and falls . Referrals and appointments  In addition, I have reviewed and discussed with patient certain preventive protocols, quality metrics, and best practice recommendations. A written personalized care plan for preventive services as well as general preventive health recommendations were provided to patient.     Michiel Cowboy, RN  10/14/2019   Medical screening examination/treatment/procedure(s) were performed by non-physician practitioner and as supervising physician I was immediately available for consultation/collaboration. I agree with above. Binnie Rail, MD

## 2019-10-17 ENCOUNTER — Other Ambulatory Visit: Payer: Self-pay

## 2019-10-17 ENCOUNTER — Encounter: Payer: Self-pay | Admitting: Hematology

## 2019-10-17 ENCOUNTER — Inpatient Hospital Stay: Payer: Medicare Other | Attending: Hematology | Admitting: Hematology

## 2019-10-17 ENCOUNTER — Telehealth: Payer: Self-pay

## 2019-10-17 VITALS — BP 125/96 | HR 96 | Temp 98.2°F | Resp 17 | Ht 63.0 in | Wt 155.1 lb

## 2019-10-17 DIAGNOSIS — E785 Hyperlipidemia, unspecified: Secondary | ICD-10-CM | POA: Insufficient documentation

## 2019-10-17 DIAGNOSIS — C180 Malignant neoplasm of cecum: Secondary | ICD-10-CM | POA: Diagnosis not present

## 2019-10-17 DIAGNOSIS — I10 Essential (primary) hypertension: Secondary | ICD-10-CM | POA: Diagnosis not present

## 2019-10-17 DIAGNOSIS — Z8042 Family history of malignant neoplasm of prostate: Secondary | ICD-10-CM | POA: Diagnosis not present

## 2019-10-17 DIAGNOSIS — K219 Gastro-esophageal reflux disease without esophagitis: Secondary | ICD-10-CM | POA: Insufficient documentation

## 2019-10-17 DIAGNOSIS — D509 Iron deficiency anemia, unspecified: Secondary | ICD-10-CM | POA: Insufficient documentation

## 2019-10-17 DIAGNOSIS — Z7951 Long term (current) use of inhaled steroids: Secondary | ICD-10-CM | POA: Insufficient documentation

## 2019-10-17 DIAGNOSIS — I2699 Other pulmonary embolism without acute cor pulmonale: Secondary | ICD-10-CM

## 2019-10-17 DIAGNOSIS — Z79899 Other long term (current) drug therapy: Secondary | ICD-10-CM | POA: Diagnosis not present

## 2019-10-17 DIAGNOSIS — Z86711 Personal history of pulmonary embolism: Secondary | ICD-10-CM | POA: Diagnosis not present

## 2019-10-17 DIAGNOSIS — M1990 Unspecified osteoarthritis, unspecified site: Secondary | ICD-10-CM | POA: Diagnosis not present

## 2019-10-17 DIAGNOSIS — F419 Anxiety disorder, unspecified: Secondary | ICD-10-CM | POA: Insufficient documentation

## 2019-10-17 DIAGNOSIS — F17210 Nicotine dependence, cigarettes, uncomplicated: Secondary | ICD-10-CM | POA: Insufficient documentation

## 2019-10-17 DIAGNOSIS — Z7901 Long term (current) use of anticoagulants: Secondary | ICD-10-CM | POA: Diagnosis not present

## 2019-10-17 MED ORDER — APIXABAN 2.5 MG PO TABS: 2.5000 mg | ORAL_TABLET | Freq: Two times a day (BID) | ORAL | 0 refills | Status: DC

## 2019-10-17 NOTE — Telephone Encounter (Signed)
Patient has been scheduled to see Dr. Dema Severin at [facility] for 10/18/19 10:00.  Patient is aware of the appt date and time.

## 2019-10-18 ENCOUNTER — Telehealth: Payer: Self-pay | Admitting: Hematology

## 2019-10-18 ENCOUNTER — Encounter: Payer: Self-pay | Admitting: Genetic Counselor

## 2019-10-18 NOTE — Telephone Encounter (Signed)
No los per 11/16. °

## 2019-10-24 ENCOUNTER — Other Ambulatory Visit: Payer: Self-pay | Admitting: Family Medicine

## 2019-10-24 ENCOUNTER — Other Ambulatory Visit: Payer: Self-pay | Admitting: Hematology

## 2019-10-24 DIAGNOSIS — R2689 Other abnormalities of gait and mobility: Secondary | ICD-10-CM

## 2019-11-08 ENCOUNTER — Other Ambulatory Visit: Payer: Self-pay | Admitting: Family Medicine

## 2019-11-08 ENCOUNTER — Other Ambulatory Visit: Payer: Self-pay | Admitting: Internal Medicine

## 2019-11-08 DIAGNOSIS — R2689 Other abnormalities of gait and mobility: Secondary | ICD-10-CM

## 2019-11-08 DIAGNOSIS — F419 Anxiety disorder, unspecified: Secondary | ICD-10-CM

## 2019-11-08 NOTE — Telephone Encounter (Signed)
Control database checked last refill: 09/28/2019  20 tabs LOV:09/15/2019 cpe [id number]

## 2019-11-09 ENCOUNTER — Telehealth: Payer: Self-pay

## 2019-11-09 NOTE — Telephone Encounter (Signed)
Copied from [place] [id number]. Topic: General - Inquiry >> Nov 08, 2019  4:19 PM Alease Frame wrote: Reason for CRM: Kennyth Lose Dr [facility] nurse called for the status of a fax that was sent over on [phone] . Please advise   Call back [phone]

## 2019-11-09 NOTE — Telephone Encounter (Signed)
Called ot have forms re-faxed. Faxing to side a fax machine

## 2019-11-17 ENCOUNTER — Telehealth: Payer: Self-pay | Admitting: Internal Medicine

## 2019-11-17 ENCOUNTER — Other Ambulatory Visit: Payer: Self-pay

## 2019-11-17 ENCOUNTER — Ambulatory Visit (INDEPENDENT_AMBULATORY_CARE_PROVIDER_SITE_OTHER): Payer: Medicare Other | Admitting: Internal Medicine

## 2019-11-17 ENCOUNTER — Encounter: Payer: Self-pay | Admitting: Internal Medicine

## 2019-11-17 VITALS — BP 110/50 | HR 106 | Temp 99.3°F | Ht 63.0 in | Wt 152.0 lb

## 2019-11-17 DIAGNOSIS — R102 Pelvic and perineal pain: Secondary | ICD-10-CM

## 2019-11-17 LAB — POCT URINALYSIS DIPSTICK
Bilirubin, UA: NEGATIVE
Blood, UA: NEGATIVE
Glucose, UA: NEGATIVE
Ketones, UA: NEGATIVE
Leukocytes, UA: NEGATIVE
Nitrite, UA: NEGATIVE
Protein, UA: NEGATIVE
Spec Grav, UA: 1.01 (ref 1.010–1.025)
Urobilinogen, UA: 0.2 E.U./dL
pH, UA: 6.5 (ref 5.0–8.0)

## 2019-11-17 MED ORDER — ONDANSETRON 4 MG PO TBDP: 4.0000 mg | ORAL_TABLET | Freq: Three times a day (TID) | ORAL | 0 refills | Status: DC | PRN

## 2019-11-17 MED ORDER — TRAMADOL HCL 50 MG PO TABS: 50.0000 mg | ORAL_TABLET | Freq: Three times a day (TID) | ORAL | 0 refills | Status: AC | PRN

## 2019-11-17 NOTE — Telephone Encounter (Signed)
Patient informed of MD response and stated understanding  

## 2019-11-17 NOTE — Assessment & Plan Note (Signed)
U/A done in office not consistent with infection. Likely related to the cancer and rx for tramadol and zofran as needed for the pain.

## 2019-11-17 NOTE — Telephone Encounter (Signed)
fyi

## 2019-11-17 NOTE — Telephone Encounter (Signed)
Probably not a lot we can do about that as it is likely related to the cancer also.

## 2019-11-17 NOTE — Patient Instructions (Signed)
We have checked the urine and there is no infection.   We have sent in tramadol to use for the pain if needed up to 3 times a day.

## 2019-11-17 NOTE — Progress Notes (Signed)
   Subjective:   Patient ID: Jamie [name], female    DOB: [birth year], 85 y.o.   MRN: [id number]  HPI The patient is an 85 YO female coming in for abdominal pain. She was having vague abdominal concerns back in October and CT scan revealed likely cancer with mass. She got in with GI and colonoscopy biopsy revealed colon cancer. She had CT for staging without appearance of distant mets. She has met with surgeon and is scheduled for hemicolectomy 12/15/19. She is now having some pain/pinching in the pelvis area. Feels similar to a past uti but no burning with urination and no frequent urination. Some constipation but taking colace which has helped. Intermittent nausea using zofran which works well.   Review of Systems  Constitutional: Negative.   HENT: Negative.   Eyes: Negative.   Respiratory: Negative for cough, chest tightness and shortness of breath.   Cardiovascular: Negative for chest pain, palpitations and leg swelling.  Gastrointestinal: Positive for abdominal pain, constipation and nausea. Negative for abdominal distention, diarrhea and vomiting.  Musculoskeletal: Negative.   Skin: Negative.   Neurological: Negative.   Psychiatric/Behavioral: Negative.     Objective:  Physical Exam Constitutional:      Appearance: She is well-developed.  HENT:     Head: Normocephalic and atraumatic.  Cardiovascular:     Rate and Rhythm: Normal rate and regular rhythm.  Pulmonary:     Effort: Pulmonary effort is normal. No respiratory distress.     Breath sounds: Normal breath sounds. No wheezing or rales.  Abdominal:     General: Bowel sounds are normal. There is no distension.     Palpations: Abdomen is soft.     Tenderness: There is no abdominal tenderness. There is no rebound.  Musculoskeletal:     Cervical back: Normal range of motion.  Skin:    General: Skin is warm and dry.  Neurological:     Mental Status: She is alert and oriented to person, place, and time.   Coordination: Coordination normal.     Vitals:   11/17/19 1423  BP: (!) 110/50  Pulse: (!) 106  Temp: 99.3 F (37.4 C)  TempSrc: Oral  SpO2: 99%  Weight: 152 lb (68.9 kg)  Height: _0  (1.6 m)    This visit occurred during the SARS-CoV-2 public health emergency.  Safety protocols were in place, including screening questions prior to the visit, additional usage of staff PPE, and extensive cleaning of exam room while observing appropriate contact time as indicated for disinfecting solutions.   Assessment & Plan:  Visit time 25 minutes: greater than 50% of that time was spent in face to face counseling and coordination of care with the patient: counseled about pain, cancer, treatment, surgery

## 2019-11-17 NOTE — Telephone Encounter (Signed)
Patient called stating that she forgot to tell Dr Sharlet Salina that she also does not have an appetite and this has been going on for a couple weeks. She has to make herself eat even when she is not hungry.

## 2019-11-26 ENCOUNTER — Ambulatory Visit: Payer: Self-pay | Admitting: Surgery

## 2019-11-26 NOTE — H&P (Signed)
CC: Newly diagnosed cecal adenocarcinoma  HPI: [name] is a very pleasant 85yoF with hx of HTN, bronchitis, GERD, anxiety, subsegmental PE, who reports a one-year history of indigestion-like symptoms and occasional bloating. She ultimately was found also have some degree of anemia with a hemoglobin of 10.8 which triggered workup. She had a CAT scan done of her abdomen and pelvis 09/26/2019 which showed a large mass in the cecum. She underwent colonoscopy one 10/04/2019 which demonstrated a fungating, infiltrative, polypoid ulcerated mass in the cecum. Biopsies were taken and returned adenocarcinoma. She is also found to have diverticula in the sigmoid but otherwise no abnormalities. She then had her staging workup completed with a CT chest. This demonstrated no evidence of metastatic disease in the chest but there was a small nonocclusive pulmonary embolus and a segmental left upper lobe pulmonary artery and subsegmental pulmonary artery to the left lower lobe. These are felt to be subacute or chronic. Probable trace nonobstructive embolus and subsegmental right lower lobe. Her CT abdomen did not demonstrate evidence of metastatic disease in the abdomen. She denies any blood in her stool or black/tarry stool. She otherwise feels well. She denies any abdominal pain.  Albumin 4.0 CEA 5.1  PMH:HTN (well controlled on oral antihypertensive), bronchitis, GERD, anxiety, incidentally found subsegmental PE (started yesterday on Eliquis by Dr. Burr Medico - Dr. Burr Medico indicated to me via Epic message that she would be cleared to stop this medication 3 days before surgery)  PSH: TAH/BSO-low midline incision  FHx: Denies FHx of malignancy  Social: Denies use of tobacco/EtOH/drugs. She is a reformed smoker-quit in 1991. 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 She lives at home and her 3 children live with her.  ROS: A comprehensive 10 system review of  systems was completed with the patient and pertinent findings as noted above.  The patient is a 85 year old female.   Past Surgical History Andreas Blower, [place]; 10/18/2019 10:03 AM) Hysterectomy (not due to cancer) - Complete   Diagnostic Studies History Andreas Blower, CMA; 10/18/2019 10:03 AM) Colonoscopy  5-10 years ago  Allergies Andreas Blower, CMA; 10/18/2019 10:04 AM) Latex  Aspirin *ANALGESICS - NonNarcotic*   Medication History (Enslee Haggett, RMA; 10/18/2019 10:20 AM) Albuterol Sulfate HFA (108 (90 Base)MCG/ACT Aerosol Soln, Inhalation) Active. ALPRAZolam (0.5MG  Tablet, Oral) Active. Acetaminophen (500MG  Tablet, Oral) Active. Alum & Mag Hydroxide-Simeth (200-200-20MG  Capsule, Oral) Active. amLODIPine Besylate (5MG  Tablet, Oral) Active. Atorvastatin Calcium (20MG  Tablet, Oral) Active. Famotidine (20MG  Tablet, Oral) Active. Fluticasone Propionate (50MCG/ACT Suspension, Nasal) Active. Furosemide (40MG  Tablet, Oral) Active. Olmesartan Medoxomil (40MG  Tablet, Oral) Active. Ondansetron (4MG  Tablet Disint, Oral) Active. Breo Ellipta (100-25MCG/INH Aero Pow Br Act, Inhalation) Active. Kenalog (0.1% Cream, External) Active. Medications Reconciled Eliquis (2.5MG  Tablet, Oral) Active.  Social History Andreas Blower, [place]; 10/18/2019 10:03 AM) Tobacco use  Former smoker.  Family History Andreas Blower, [place]; 10/18/2019 10:03 AM) Family history unknown  First Degree Relatives   Pregnancy / Birth History Andreas Blower, [place]; 10/18/2019 10:03 AM) Durenda Age  6  Other Problems (Armen Ferguson, CMA; 10/18/2019 10:03 AM) Anxiety Disorder  Arthritis  Gastric Ulcer  Gastroesophageal Reflux Disease  High blood pressure  Lump In Breast     Review of Systems Harrell Gave M. Dandre Sisler MD; 10/18/2019 10:36 AM) General Present- Appetite Loss. Not Present- Chills, Fatigue, Fever, Night Sweats, Weight Gain and Weight Loss. HEENT Present- Hoarseness.  Not Present- Earache, Hearing Loss, Nose Bleed, Oral Ulcers, Ringing in the Ears, Seasonal Allergies, Sinus Pain,  Sore Throat, Visual Disturbances, Wears glasses/contact lenses and Yellow Eyes. Respiratory Present- Chronic Cough and Wheezing. Not Present- Bloody sputum, Difficulty Breathing and Snoring. Breast Not Present- Breast Mass, Breast Pain, Nipple Discharge and Skin Changes. Cardiovascular Present- Shortness of Breath. Not Present- Chest Pain, Difficulty Breathing Lying Down, Leg Cramps, Palpitations, Rapid Heart Rate and Swelling of Extremities. Gastrointestinal Present- Bloating, Constipation and Excessive gas. Not Present- Abdominal Pain, Bloody Stool, Change in Bowel Habits, Chronic diarrhea, Difficulty Swallowing, Gets full quickly at meals, Hemorrhoids, Indigestion, Nausea, Rectal Pain and Vomiting. Female Genitourinary Not Present- Frequency, Nocturia, Painful Urination, Pelvic Pain and Urgency. Psychiatric Present- Anxiety. Not Present- Bipolar, Change in Sleep Pattern, Depression, Fearful and Frequent crying. Endocrine Not Present- Cold Intolerance, Excessive Hunger, Hair Changes, Heat Intolerance, Hot flashes and New Diabetes. Hematology Present- Anemia and Blood Thinners.  Vitals (Armen Ferguson CMA; 10/18/2019 10:04 AM) 10/18/2019 10:03 AM Weight: 155.25 lb Height: 60in Body Surface Area: 1.68 m Body Mass Index: 30.32 kg/m  Temp.: 97.71F  Pulse: 91 (Regular)  P.OX: 98% (Room air) BP: 130/58 (Sitting, Left Arm, Standard)       Physical Exam Harrell Gave M. Arnett Duddy MD; 10/18/2019 10:37 AM) The physical exam findings are as follows: Note:Constitutional: No acute distress; conversant; no deformities; wearing medical mask Eyes: Moist conjunctiva; no lid lag; anicteric sclerae; pupils equal and round Neck: Trachea midline; no palpable thyromegaly Lungs: Normal respiratory effort; no tactile fremitus CV: rrr; no palpable thrill; no pitting edema GI: Abdomen  soft, nontender, nondistended; no palpable hepatosplenomegaly; no palpable masses MSK: Normal gait; no clubbing/cyanosis Psychiatric: Appropriate affect; alert and oriented 3 Lymphatic: No palpable cervical or axillary lymphadenopathy    Assessment & Plan Harrell Gave M. Pamela Maddy MD; 10/18/2019 10:41 AM) COLON CANCER (C18.9) Story: Ms. Truong is a very pleasant 3yoF with hx of HTN, bronchitis, GERD, anxiety, incidentally found subsegmental pulmonary emboli - with newly diagnosed adenocarcinoma of the cecum. CT C/A/P demonstrate no evidence of metastatic disease CEA 5.1 Alb 4.0 Impression: -The anatomy and physiology of the GI tract was discussed at length with the patient. The pathophysiology of colon cancer was discussed at length with associated pictures as well -We discussed right hemicolectomy (laparoscopic and potential open techniques) and the rationale therein. We discussed that without surgery, this would likely continue to grow and will ultimately spread and take her life. There is no "curative" options short of surgery. We did discuss chemotherapy. We also reviewed scenarios where chemotherapy as recommended after surgery for patients with locally advanced/regional disease. -The planned procedure, material risks (including, but not limited to, pain, bleeding, infection, scarring, need for blood transfusion, damage to surrounding structures- blood vessels/nerves/viscus/organs, damage to ureter, leak from anastomosis, need for additional procedures, need for stoma which may be permanent, hernia, recurrence of cancer, pneumonia, heart attack, stroke, death) benefits and alternatives to surgery were discussed at length. The patient's questions were answered to her satisfaction, she voiced understanding and elected to proceed with surgery. Additionally, we discussed typical postoperative expectations and the recovery process. -We discussed as per Dr. Burr Medico, holding her Eliquis 72 hours before  surgery. We are requesting preoperative clearance from her PCP, Dr. Pricilla Holm Counseling and coordination of care (all of which were face to face) represented >50% of the time spent with patient. Total time spent with patient and charting: 65 minutes  Signed by Ileana Roup, MD (10/18/2019 10:41 AM)

## 2019-12-04 ENCOUNTER — Other Ambulatory Visit: Payer: Self-pay | Admitting: Internal Medicine

## 2019-12-09 ENCOUNTER — Other Ambulatory Visit (HOSPITAL_COMMUNITY): Payer: Self-pay | Admitting: *Deleted

## 2019-12-09 NOTE — Patient Instructions (Addendum)
DUE TO COVID-19 ONLY ONE VISITOR IS ALLOWED TO COME WITH YOU AND STAY IN THE WAITING ROOM ONLY DURING PRE OP AND PROCEDURE DAY OF SURGERY. THE 1 VISITOR MAY VISIT WITH YOU AFTER SURGERY IN YOUR PRIVATE ROOM DURING VISITING HOURS ONLY!  YOU NEED TO HAVE A COVID 19 TEST ON__ Monday 01/11/2021_____ @__0940  am_____, THIS TEST MUST BE DONE BEFORE SURGERY, COME  Jamie [name] , [id number].  ([place]) ONCE YOUR COVID TEST IS COMPLETED, PLEASE BEGIN THE QUARANTINE INSTRUCTIONS AS OUTLINED IN YOUR HANDOUT.                Jamie [name]     Your procedure is scheduled on: Thursday 12/15/2019   Report to [facility] Main  Entrance    Report to admitting at 1200 PM     Call this number if you have problems the morning of surgery [phone]      Please Follow Bowel Prep instructions from Dr. Orest Dikes office on Wednesday 12/14/2019 and drink clear liquids all day.     NOTHING BY MOUTH EXCEPT CLEAR LIQUIDS UNTIL  1100 am .       PLEASE FINISH ENSURE DRINK PER SURGEON ORDER  WHICH NEEDS TO BE COMPLETED AT 1100 am .   CLEAR LIQUID DIET   Foods Allowed                                                                     Foods Excluded  Coffee and tea, regular and decaf                             liquids that you cannot  Plain Jell-O any favor except red or purple                                           see through such as: Fruit ices (not with fruit pulp)                                     milk, soups, orange juice  Iced Popsicles                                    All solid food Carbonated beverages, regular and diet                                    Cranberry, grape and apple juices Sports drinks like Gatorade Lightly seasoned clear broth or consume(fat free) Sugar, honey syrup  Sample Menu Breakfast                                Lunch  Supper Cranberry juice                    Beef broth                             Chicken broth Jell-O                                     Grape juice                           Apple juice Coffee or tea                        Jell-O                                      Popsicle                                                Coffee or tea                        Coffee or tea  _____________________________________________________________________     BRUSH YOUR TEETH MORNING OF SURGERY AND RINSE YOUR MOUTH OUT, NO CHEWING GUM CANDY OR MINTS.     Take these medicines the morning of surgery with A SIP OF WATER: Amlodipine (Norvasc), Famotidine (Pepcid),  Alprazolam (Xanax) if needed for anxiety , use Breo Ellipta inhaler, use Albuterol inhaler if  needed and bring inhalers with you to the hospital                                 You may not have any metal on your body including hair pins and              piercings  Do not wear jewelry, make-up, lotions, powders or perfumes, deodorant             Do not wear nail polish on your fingernails.  Do not shave  48 hours prior to surgery.                Do not bring valuables to the hospital. [place].  Contacts, dentures or bridgework may not be worn into surgery.  Leave suitcase in the car. After surgery it may be brought to your room.     Patients discharged the day of surgery will not be allowed to drive home.   IF YOU ARE HAVING SURGERY AND GOING HOME THE SAME DAY, YOU MUST HAVE AN ADULT TO DRIVE YOU HOME AND  BE WITH YOU FOR 24 HOURS. YOU MAY GO HOME BY TAXI OR  UBER OR ORTHERWISE, BUT AN ADULT MUST ACCOMPANY YOU HOME AND STAY WITH YOU FOR 24 HOURS.  Name and phone number of your driver: son-Phillip  [phone]                 Please  read over the following fact sheets you were given: _____________________________________________________________________             [facility] - Preparing for Surgery Before surgery, you can play an important  role.  Because skin is not sterile, your skin needs to be as free of germs as possible.  You can reduce the number of germs on your skin by washing with CHG (chlorahexidine gluconate) soap before surgery.  CHG is an antiseptic cleaner which kills germs and bonds with the skin to continue killing germs even after washing. Please DO NOT use if you have an allergy to CHG or antibacterial soaps.  If your skin becomes reddened/irritated stop using the CHG and inform your nurse when you arrive at Short Stay. Do not shave (including legs and underarms) for at least 48 hours prior to the first CHG shower.  You may shave your face/neck. Please follow these instructions carefully:  1.  Shower with CHG Soap the night before surgery and the  morning of Surgery.  2.  If you choose to wash your hair, wash your hair first as usual with your  normal  shampoo.  3.  After you shampoo, rinse your hair and body thoroughly to remove the  shampoo.                           4.  Use CHG as you would any other liquid soap.  You can apply chg directly  to the skin and wash                       Gently with a scrungie or clean washcloth.  5.  Apply the CHG Soap to your body ONLY FROM THE NECK DOWN.   Do not use on face/ open                           Wound or open sores. Avoid contact with eyes, ears mouth and genitals (private parts).                       Wash face,  Genitals (private parts) with your normal soap.             6.  Wash thoroughly, paying special attention to the area where your surgery  will be performed.  7.  Thoroughly rinse your body with warm water from the neck down.  8.  DO NOT shower/wash with your normal soap after using and rinsing off  the CHG Soap.                9.  Pat yourself dry with a clean towel.            10.  Wear clean pajamas.            11.  Place clean sheets on your bed the night of your first shower and do not  sleep with pets. Day of Surgery : Do not apply any lotions/deodorants  the morning of surgery.  Please wear clean clothes to the hospital/surgery center.  FAILURE TO FOLLOW THESE INSTRUCTIONS MAY RESULT IN THE CANCELLATION OF YOUR SURGERY PATIENT SIGNATURE_________________________________  NURSE SIGNATURE__________________________________  ________________________________________________________________________   Jamie [name]  An incentive spirometer is a tool that can help keep your lungs clear and active. This tool measures how well you are filling your lungs with each breath. Taking long  deep breaths may help reverse or decrease the chance of developing breathing (pulmonary) problems (especially infection) following:  A long period of time when you are unable to move or be active. BEFORE THE PROCEDURE   If the spirometer includes an indicator to show your best effort, your nurse or respiratory therapist will set it to a desired goal.  If possible, sit up straight or lean slightly forward. Try not to slouch.  Hold the incentive spirometer in an upright position. INSTRUCTIONS FOR USE  1. Sit on the edge of your bed if possible, or sit up as far as you can in bed or on a chair. 2. Hold the incentive spirometer in an upright position. 3. Breathe out normally. 4. Place the mouthpiece in your mouth and seal your lips tightly around it. 5. Breathe in slowly and as deeply as possible, raising the piston or the ball toward the top of the column. 6. Hold your breath for 3-5 seconds or for as long as possible. Allow the piston or ball to fall to the bottom of the column. 7. Remove the mouthpiece from your mouth and breathe out normally. 8. Rest for a few seconds and repeat Steps 1 through 7 at least 10 times every 1-2 hours when you are awake. Take your time and take a few normal breaths between deep breaths. 9. The spirometer may include an indicator to show your best effort. Use the indicator as a goal to work toward during each repetition. 10. After  each set of 10 deep breaths, practice coughing to be sure your lungs are clear. If you have an incision (the cut made at the time of surgery), support your incision when coughing by placing a pillow or rolled up towels firmly against it. Once you are able to get out of bed, walk around indoors and cough well. You may stop using the incentive spirometer when instructed by your caregiver.  RISKS AND COMPLICATIONS  Take your time so you do not get dizzy or light-headed.  If you are in pain, you may need to take or ask for pain medication before doing incentive spirometry. It is harder to take a deep breath if you are having pain. AFTER USE  Rest and breathe slowly and easily.  It can be helpful to keep track of a log of your progress. Your caregiver can provide you with a simple table to help with this. If you are using the spirometer at home, follow these instructions: [place] IF:   You are having difficultly using the spirometer.  You have trouble using the spirometer as often as instructed.  Your pain medication is not giving enough relief while using the spirometer.  You develop fever of 100.5 F (38.1 C) or higher. SEEK IMMEDIATE MEDICAL CARE IF:   You cough up bloody sputum that had not been present before.  You develop fever of 102 F (38.9 C) or greater.  You develop worsening pain at or near the incision site. MAKE SURE YOU:   Understand these instructions.  Will watch your condition.  Will get help right away if you are not doing well or get worse. Document Released: 03/30/2007 Document Revised: 02/09/2012 Document Reviewed: 05/31/2007 [facility] Patient Information 2014 [place], [place].   ________________________________________________________________________

## 2019-12-12 ENCOUNTER — Encounter (HOSPITAL_COMMUNITY)
Admission: RE | Admit: 2019-12-12 | Discharge: 2019-12-12 | Disposition: A | Discharge status: Home / Self Care | Payer: Medicare PPO | Source: Ambulatory Visit | Attending: Surgery | Admitting: Surgery

## 2019-12-12 ENCOUNTER — Encounter (HOSPITAL_COMMUNITY): Payer: Self-pay

## 2019-12-12 ENCOUNTER — Other Ambulatory Visit: Payer: Self-pay

## 2019-12-12 ENCOUNTER — Other Ambulatory Visit (HOSPITAL_COMMUNITY)
Admission: RE | Admit: 2019-12-12 | Discharge: 2019-12-12 | Disposition: A | Discharge status: Home / Self Care | Payer: Medicare PPO | Source: Ambulatory Visit | Attending: Surgery | Admitting: Surgery

## 2019-12-12 DIAGNOSIS — J45909 Unspecified asthma, uncomplicated: Secondary | ICD-10-CM | POA: Diagnosis present

## 2019-12-12 DIAGNOSIS — E785 Hyperlipidemia, unspecified: Secondary | ICD-10-CM | POA: Diagnosis present

## 2019-12-12 DIAGNOSIS — Z01818 Encounter for other preprocedural examination: Secondary | ICD-10-CM | POA: Insufficient documentation

## 2019-12-12 DIAGNOSIS — Z85038 Personal history of other malignant neoplasm of large intestine: Secondary | ICD-10-CM | POA: Diagnosis not present

## 2019-12-12 DIAGNOSIS — F419 Anxiety disorder, unspecified: Secondary | ICD-10-CM | POA: Diagnosis present

## 2019-12-12 DIAGNOSIS — Z20822 Contact with and (suspected) exposure to covid-19: Secondary | ICD-10-CM | POA: Diagnosis present

## 2019-12-12 DIAGNOSIS — K660 Peritoneal adhesions (postprocedural) (postinfection): Secondary | ICD-10-CM | POA: Diagnosis present

## 2019-12-12 DIAGNOSIS — Z87891 Personal history of nicotine dependence: Secondary | ICD-10-CM | POA: Diagnosis not present

## 2019-12-12 DIAGNOSIS — K219 Gastro-esophageal reflux disease without esophagitis: Secondary | ICD-10-CM | POA: Diagnosis present

## 2019-12-12 DIAGNOSIS — Z8249 Family history of ischemic heart disease and other diseases of the circulatory system: Secondary | ICD-10-CM | POA: Diagnosis not present

## 2019-12-12 DIAGNOSIS — I10 Essential (primary) hypertension: Secondary | ICD-10-CM | POA: Diagnosis present

## 2019-12-12 DIAGNOSIS — Z8711 Personal history of peptic ulcer disease: Secondary | ICD-10-CM | POA: Diagnosis not present

## 2019-12-12 DIAGNOSIS — H905 Unspecified sensorineural hearing loss: Secondary | ICD-10-CM | POA: Diagnosis present

## 2019-12-12 DIAGNOSIS — Z9841 Cataract extraction status, right eye: Secondary | ICD-10-CM | POA: Diagnosis not present

## 2019-12-12 DIAGNOSIS — Z7901 Long term (current) use of anticoagulants: Secondary | ICD-10-CM | POA: Diagnosis not present

## 2019-12-12 DIAGNOSIS — Z86711 Personal history of pulmonary embolism: Secondary | ICD-10-CM | POA: Insufficient documentation

## 2019-12-12 DIAGNOSIS — Z961 Presence of intraocular lens: Secondary | ICD-10-CM | POA: Diagnosis present

## 2019-12-12 DIAGNOSIS — D122 Benign neoplasm of ascending colon: Secondary | ICD-10-CM | POA: Diagnosis not present

## 2019-12-12 DIAGNOSIS — Z90710 Acquired absence of both cervix and uterus: Secondary | ICD-10-CM | POA: Diagnosis not present

## 2019-12-12 DIAGNOSIS — D630 Anemia in neoplastic disease: Secondary | ICD-10-CM | POA: Diagnosis present

## 2019-12-12 DIAGNOSIS — I2699 Other pulmonary embolism without acute cor pulmonale: Secondary | ICD-10-CM | POA: Diagnosis present

## 2019-12-12 DIAGNOSIS — M545 Low back pain: Secondary | ICD-10-CM | POA: Diagnosis present

## 2019-12-12 DIAGNOSIS — J449 Chronic obstructive pulmonary disease, unspecified: Secondary | ICD-10-CM | POA: Diagnosis not present

## 2019-12-12 DIAGNOSIS — C182 Malignant neoplasm of ascending colon: Secondary | ICD-10-CM | POA: Diagnosis not present

## 2019-12-12 DIAGNOSIS — Z9842 Cataract extraction status, left eye: Secondary | ICD-10-CM | POA: Diagnosis not present

## 2019-12-12 DIAGNOSIS — C180 Malignant neoplasm of cecum: Secondary | ICD-10-CM | POA: Diagnosis present

## 2019-12-12 DIAGNOSIS — Z79899 Other long term (current) drug therapy: Secondary | ICD-10-CM | POA: Diagnosis not present

## 2019-12-12 DIAGNOSIS — Z23 Encounter for immunization: Secondary | ICD-10-CM | POA: Diagnosis present

## 2019-12-12 LAB — CBC WITH DIFFERENTIAL/PLATELET
Abs Immature Granulocytes: 0.02 10*3/uL (ref 0.00–0.07)
Basophils Absolute: 0 10*3/uL (ref 0.0–0.1)
Basophils Relative: 1 %
Eosinophils Absolute: 0.3 10*3/uL (ref 0.0–0.5)
Eosinophils Relative: 4 %
HCT: 28.6 % — ABNORMAL LOW (ref 36.0–46.0)
Hemoglobin: 8.4 g/dL — ABNORMAL LOW (ref 12.0–15.0)
Immature Granulocytes: 0 %
Lymphocytes Relative: 18 %
Lymphs Abs: 1.3 10*3/uL (ref 0.7–4.0)
MCH: 26.5 pg (ref 26.0–34.0)
MCHC: 29.4 g/dL — ABNORMAL LOW (ref 30.0–36.0)
MCV: 90.2 fL (ref 80.0–100.0)
Monocytes Absolute: 0.6 10*3/uL (ref 0.1–1.0)
Monocytes Relative: 8 %
Neutro Abs: 4.9 10*3/uL (ref 1.7–7.7)
Neutrophils Relative %: 69 %
Platelets: 341 10*3/uL (ref 150–400)
RBC: 3.17 MIL/uL — ABNORMAL LOW (ref 3.87–5.11)
RDW: 14.6 % (ref 11.5–15.5)
WBC: 7.1 10*3/uL (ref 4.0–10.5)
nRBC: 0 % (ref 0.0–0.2)

## 2019-12-12 LAB — COMPREHENSIVE METABOLIC PANEL
ALT: 11 U/L (ref 0–44)
AST: 16 U/L (ref 15–41)
Albumin: 3.9 g/dL (ref 3.5–5.0)
Alkaline Phosphatase: 150 U/L — ABNORMAL HIGH (ref 38–126)
Anion gap: 11 (ref 5–15)
BUN: 14 mg/dL (ref 8–23)
CO2: 24 mmol/L (ref 22–32)
Calcium: 9.2 mg/dL (ref 8.9–10.3)
Chloride: 108 mmol/L (ref 98–111)
Creatinine, Ser: 0.77 mg/dL (ref 0.44–1.00)
GFR calc Af Amer: >60 mL/min (ref >60)
GFR calc non Af Amer: >60 mL/min (ref >60)
Glucose, Bld: 101 mg/dL — ABNORMAL HIGH (ref 70–99)
Potassium: 3.9 mmol/L (ref 3.5–5.1)
Sodium: 143 mmol/L (ref 135–145)
Total Bilirubin: 0.6 mg/dL (ref 0.3–1.2)
Total Protein: 7.7 g/dL (ref 6.5–8.1)

## 2019-12-12 LAB — HEMOGLOBIN A1C
Hgb A1c MFr Bld: 5.1 % (ref 4.8–5.6)
Mean Plasma Glucose: 99.67 mg/dL

## 2019-12-12 LAB — PROTIME-INR
INR: 1 (ref 0.8–1.2)
Prothrombin Time: 13.4 seconds (ref 11.4–15.2)

## 2019-12-12 LAB — APTT: aPTT: 33 seconds (ref 24–36)

## 2019-12-12 NOTE — Progress Notes (Addendum)
PCP - Dr. Pricilla Holm Cardiologist - n/a  Chest x-ray - n/a 10/10/2019- CT w/contrast EPIC EKG - 12/12/2019   EPIC Stress Test - n/a ECHO - n/a Cardiac Cath - n/a  Sleep Study - n/a CPAP - n/a  Fasting Blood Sugar - n/a Checks Blood Sugar __0___ times a day  Blood Thinner Instructions:n/a Aspirin Instructions:n/a Last Dose: Patient states was on Eliquis 2.5 mg from 10/19/2019 until last dose 11/20/2019 for blood clot in lung.  Anesthesia review:  Chart to be reviewed by Konrad Felix, PA for medical history.    Patient has a history of HTN, pulmonary embolism (10/2019), asthma, GERD, and a cecal mass at present.  Patient denies shortness of breath, fever, cough and chest pain at PAT appointment   Patient verbalized understanding of instructions that were given to them at the PAT appointment. Patient was also instructed that they will need to review over the PAT instructions again at home before surgery.

## 2019-12-13 LAB — NOVEL CORONAVIRUS, NAA (HOSP ORDER, SEND-OUT TO REF LAB; TAT 18-24 HRS): SARS-CoV-2, NAA: NOT DETECTED

## 2019-12-14 ENCOUNTER — Telehealth (HOSPITAL_COMMUNITY): Payer: Self-pay | Admitting: *Deleted

## 2019-12-14 MED ORDER — BUPIVACAINE LIPOSOME 1.3 % IJ SUSP
20.0000 mL | Freq: Once | INTRAMUSCULAR | Status: DC
  Filled 2019-12-14: qty 20

## 2019-12-14 NOTE — Progress Notes (Signed)
Anesthesia Chart Review   Case: [id number] Date/Time: 12/15/19 1100   Procedure: LAPAROSCOPIC RIGHT HEMI COLECTOMY (Right )   Anesthesia type: General   Pre-op diagnosis: CECAL ADENOCARCINOMA (COLON CANCER)   Location: [facility] ROOM 05 / [facility]   Surgeons: Ileana Roup, MD      DISCUSSION:86 y.o. former smoker (5 pack years, quit 12/01/89) with h/o HTN, GERD, HLD, PE 10/2019, colon cancer scheduled for above procedure 10/14/20 with Dr. Nadeen Landau.    Recent PE.  Per oncologist, "I think her small volume PE is related to her colon cancer.  I recommended anticoagulation with Eliquis. Due to her advanced age, will start her on low dose 5mg  bid for 7 days then change to maintenance dose 2.5 mg twice daily.  She is okay to stop 2-3 days before surgery, and restart after surgery. Plan for a total of 3 months."  During PAT nurse visit pt reports last dose 11/20/2019.  Advised to discuss with Dr. Burr Medico.   Iron deficiency anemia due to colon cancer, labs forwarded to surgeon and oncologist.  VS: There were no vitals taken for this visit.  PROVIDERS: Hoyt Koch, MD  Truitt Merle, MD is Oncologist  LABS: labs forwarded to surgeon (all labs ordered are listed, but only abnormal results are displayed)  Labs Reviewed - No data to display   IMAGES: CT Chest 10/10/2019 IMPRESSION: 1. Small volume nonocclusive pulmonary embolus identified in segmental left upper lobe pulmonary artery and subsegmental pulmonary artery to the left lower lobe. Left lower lobe embolus has a string like configuration suggesting that these findings may well be subacute to chronic. Probable trace nonobstructive embolus in a subsegmental artery to the right lower lobe. 2. No evidence for metastatic disease in the thorax. 3.  Aortic Atherosclerois (ICD10-170.0)  EKG: 12/12/19 108 bpm Sinus tachycardia Artifact Otherwise normal ECG  CV:  Past Medical History:  Diagnosis Date  . Adenocarcinoma of  cecum ([place]) 09/28/2019  . ANEMIA, IRON DEFICIENCY 09/10/2007  . Anxiety   . Arthritis   . Asthma   . Blood transfusion without reported diagnosis   . Cataract 2008   bilateral cataract extraction  . CATARACT EXTRACTION, HX OF 09/10/2007  . DYSPEPSIA, HX OF 09/10/2007  . GERD (gastroesophageal reflux disease)   . HAIR LOSS 03/21/2009  . HAY FEVER 09/10/2007  . HEARING LOSS, SENSORINEURAL 09/10/2007  . HYPERLIPIDEMIA 09/10/2007  . HYPERTENSION 09/10/2007  . LOW BACK PAIN 05/10/2008  . Pain in joint, hand 03/01/2008  . PEPTIC ULCER DISEASE 09/10/2007  . POLYP, COLON 09/10/2007  . POLYPECTOMY, HX OF 09/10/2007  . Pulmonary embolism ([place]) 1- 2 non-occlusive 10/10/2019  . SHINGLES 08/13/2009  . SHOULDER PAIN, LEFT 02/12/2008  . TAH/BSO, HX OF 09/10/2007  . Tuberculosis    PATIENT DENIES AT PHONE INTERVIEW ON 12/12/2019    Past Surgical History:  Procedure Laterality Date  . ABDOMINAL HYSTERECTOMY    . BREAST SURGERY     breast biopsy-benign  . CATARACT EXTRACTION    . COLONOSCOPY    . EYE SURGERY     bilateral cataract with lens implant  . IRRIGATION AND DEBRIDEMENT SEBACEOUS CYST    . TOTAL ABDOMINAL HYSTERECTOMY W/ BILATERAL SALPINGOOPHORECTOMY      MEDICATIONS: . [START ON 12/15/2019] bupivacaine liposome (EXPAREL) 1.3 % injection 266 mg   . acetaminophen (TYLENOL) 500 MG tablet  . ALPRAZolam (XANAX) 0.5 MG tablet  . alum & mag hydroxide-simeth (MYLANTA) 200-200-20 MG/5ML suspension  . amLODipine (NORVASC) 5 MG tablet  .  atorvastatin (LIPITOR) 20 MG tablet  . BREO ELLIPTA 100-25 MCG/INH AEPB  . famotidine (PEPCID) 20 MG tablet  . furosemide (LASIX) 40 MG tablet  . olmesartan (BENICAR) 40 MG tablet  . ondansetron (ZOFRAN-ODT) 4 MG disintegrating tablet  . PROAIR HFA 108 (90 Base) MCG/ACT inhaler  . sennosides-docusate sodium (SENOKOT-S) 8.6-50 MG tablet  . triamcinolone cream (KENALOG) 0.1 %  . apixaban (ELIQUIS) 2.5 MG TABS tablet  . fluticasone (FLONASE) 50 MCG/ACT  nasal spray  . traMADol (ULTRAM) 50 MG tablet    Maia Plan [facility] Pre-Surgical Testing [phone] 12/14/19  10:22 AM

## 2019-12-14 NOTE — Anesthesia Preprocedure Evaluation (Addendum)
Anesthesia Evaluation  Patient identified by MRN, date of birth, ID band Patient awake    Reviewed: Allergy & Precautions, NPO status , Patient's Chart, lab work & pertinent test results  Airway Mallampati: II  TM Distance: >3 FB Neck ROM: Full    Dental no notable dental hx. (+) Upper Dentures, Partial Lower   Pulmonary COPD,  COPD inhaler, former smoker, PE   Pulmonary exam normal breath sounds clear to auscultation       Cardiovascular Exercise Tolerance: Good hypertension, Pt. on medications Normal cardiovascular exam Rhythm:Regular Rate:Normal  EKG-ST   Neuro/Psych negative neurological ROS  negative psych ROS   GI/Hepatic Neg liver ROS, GERD  ,  Endo/Other    Renal/GU K+ 3.9 Cr 0.77     Musculoskeletal negative musculoskeletal ROS (+)   Abdominal   Peds  Hematology  (+) anemia , Hgb 8.4 plt 341   Anesthesia Other Findings   Reproductive/Obstetrics                           Anesthesia Physical Anesthesia Plan  ASA: III  Anesthesia Plan: General   Post-op Pain Management:    Induction: Intravenous  PONV Risk Score and Plan: 3 and Treatment may vary due to age or medical condition, Ondansetron and Dexamethasone  Airway Management Planned: Oral ETT  Additional Equipment: None  Intra-op Plan:   Post-operative Plan: Extubation in OR  Informed Consent: I have reviewed the patients History and Physical, chart, labs and discussed the procedure including the risks, benefits and alternatives for the proposed anesthesia with the patient or authorized representative who has indicated his/her understanding and acceptance.     Dental advisory given  Plan Discussed with: CRNA  Anesthesia Plan Comments: (GA w lidocaine infusion )       Anesthesia Quick Evaluation

## 2019-12-15 ENCOUNTER — Inpatient Hospital Stay (HOSPITAL_COMMUNITY): Payer: Medicare PPO | Admitting: Physician Assistant

## 2019-12-15 ENCOUNTER — Telehealth (HOSPITAL_COMMUNITY): Payer: Self-pay | Admitting: *Deleted

## 2019-12-15 ENCOUNTER — Encounter (HOSPITAL_COMMUNITY)
Admission: RE | Disposition: A | Discharge status: Home / Self Care | Payer: Self-pay | Source: Home / Self Care | Attending: Surgery

## 2019-12-15 ENCOUNTER — Other Ambulatory Visit: Payer: Self-pay

## 2019-12-15 ENCOUNTER — Inpatient Hospital Stay (HOSPITAL_COMMUNITY)
Admission: RE | Admit: 2019-12-15 | Discharge: 2019-12-18 | DRG: 329 | Disposition: A | Discharge status: Home / Self Care | Payer: Medicare PPO | Attending: Surgery | Admitting: Surgery

## 2019-12-15 ENCOUNTER — Encounter (HOSPITAL_COMMUNITY): Payer: Self-pay | Admitting: Surgery

## 2019-12-15 DIAGNOSIS — J45909 Unspecified asthma, uncomplicated: Secondary | ICD-10-CM | POA: Diagnosis present

## 2019-12-15 DIAGNOSIS — C180 Malignant neoplasm of cecum: Principal | ICD-10-CM | POA: Diagnosis present

## 2019-12-15 DIAGNOSIS — K660 Peritoneal adhesions (postprocedural) (postinfection): Secondary | ICD-10-CM | POA: Diagnosis present

## 2019-12-15 DIAGNOSIS — Z85038 Personal history of other malignant neoplasm of large intestine: Secondary | ICD-10-CM

## 2019-12-15 DIAGNOSIS — E785 Hyperlipidemia, unspecified: Secondary | ICD-10-CM | POA: Diagnosis present

## 2019-12-15 DIAGNOSIS — Z79899 Other long term (current) drug therapy: Secondary | ICD-10-CM | POA: Diagnosis not present

## 2019-12-15 DIAGNOSIS — F419 Anxiety disorder, unspecified: Secondary | ICD-10-CM | POA: Diagnosis present

## 2019-12-15 DIAGNOSIS — Z87891 Personal history of nicotine dependence: Secondary | ICD-10-CM

## 2019-12-15 DIAGNOSIS — Z8249 Family history of ischemic heart disease and other diseases of the circulatory system: Secondary | ICD-10-CM | POA: Diagnosis not present

## 2019-12-15 DIAGNOSIS — Z961 Presence of intraocular lens: Secondary | ICD-10-CM | POA: Diagnosis present

## 2019-12-15 DIAGNOSIS — Z23 Encounter for immunization: Secondary | ICD-10-CM | POA: Diagnosis present

## 2019-12-15 DIAGNOSIS — D630 Anemia in neoplastic disease: Secondary | ICD-10-CM | POA: Diagnosis present

## 2019-12-15 DIAGNOSIS — I2699 Other pulmonary embolism without acute cor pulmonale: Secondary | ICD-10-CM | POA: Diagnosis present

## 2019-12-15 DIAGNOSIS — Z90710 Acquired absence of both cervix and uterus: Secondary | ICD-10-CM

## 2019-12-15 DIAGNOSIS — Z8711 Personal history of peptic ulcer disease: Secondary | ICD-10-CM

## 2019-12-15 DIAGNOSIS — Z9841 Cataract extraction status, right eye: Secondary | ICD-10-CM

## 2019-12-15 DIAGNOSIS — I10 Essential (primary) hypertension: Secondary | ICD-10-CM | POA: Diagnosis present

## 2019-12-15 DIAGNOSIS — K219 Gastro-esophageal reflux disease without esophagitis: Secondary | ICD-10-CM | POA: Diagnosis present

## 2019-12-15 DIAGNOSIS — H905 Unspecified sensorineural hearing loss: Secondary | ICD-10-CM | POA: Diagnosis present

## 2019-12-15 DIAGNOSIS — Z9842 Cataract extraction status, left eye: Secondary | ICD-10-CM | POA: Diagnosis not present

## 2019-12-15 DIAGNOSIS — M545 Low back pain: Secondary | ICD-10-CM | POA: Diagnosis present

## 2019-12-15 DIAGNOSIS — Z7901 Long term (current) use of anticoagulants: Secondary | ICD-10-CM | POA: Diagnosis not present

## 2019-12-15 DIAGNOSIS — C189 Malignant neoplasm of colon, unspecified: Secondary | ICD-10-CM | POA: Diagnosis present

## 2019-12-15 DIAGNOSIS — Z20822 Contact with and (suspected) exposure to covid-19: Secondary | ICD-10-CM | POA: Diagnosis present

## 2019-12-15 HISTORY — PX: LAPAROSCOPIC RIGHT HEMI COLECTOMY: SHX5926

## 2019-12-15 LAB — TYPE AND SCREEN
ABO/RH(D): O POS
Antibody Screen: NEGATIVE

## 2019-12-15 LAB — ABO/RH: ABO/RH(D): O POS

## 2019-12-15 SURGERY — LAPAROSCOPIC RIGHT HEMI COLECTOMY
Anesthesia: General | Site: Abdomen | Laterality: Right

## 2019-12-15 MED ORDER — DEXAMETHASONE SODIUM PHOSPHATE 10 MG/ML IJ SOLN
INTRAMUSCULAR | Status: DC | PRN
  Administered 2019-12-15: 6 mg via INTRAVENOUS

## 2019-12-15 MED ORDER — FUROSEMIDE 40 MG PO TABS
40.0000 mg | ORAL_TABLET | Freq: Every day | ORAL | Status: DC
  Administered 2019-12-16 – 2019-12-18 (×3): 40 mg via ORAL
  Filled 2019-12-15 (×3): qty 1

## 2019-12-15 MED ORDER — FLUTICASONE FUROATE-VILANTEROL 100-25 MCG/INH IN AEPB
1.0000 | INHALATION_SPRAY | Freq: Every day | RESPIRATORY_TRACT | Status: DC
  Administered 2019-12-16 – 2019-12-18 (×3): 1 via RESPIRATORY_TRACT
  Filled 2019-12-15: qty 28

## 2019-12-15 MED ORDER — DIPHENHYDRAMINE HCL 12.5 MG/5ML PO ELIX: 12.5000 mg | ORAL_SOLUTION | Freq: Four times a day (QID) | ORAL | Status: DC | PRN

## 2019-12-15 MED ORDER — ONDANSETRON HCL 4 MG/2ML IJ SOLN
INTRAMUSCULAR | Status: DC | PRN
  Administered 2019-12-15: 4 mg via INTRAVENOUS

## 2019-12-15 MED ORDER — PHENYLEPHRINE 40 MCG/ML (10ML) SYRINGE FOR IV PUSH (FOR BLOOD PRESSURE SUPPORT)
PREFILLED_SYRINGE | INTRAVENOUS | Status: AC
  Filled 2019-12-15: qty 10

## 2019-12-15 MED ORDER — ALUM & MAG HYDROXIDE-SIMETH 200-200-20 MG/5ML PO SUSP: 15.0000 mL | Freq: Four times a day (QID) | ORAL | Status: DC | PRN

## 2019-12-15 MED ORDER — FAMOTIDINE 20 MG PO TABS
20.0000 mg | ORAL_TABLET | Freq: Two times a day (BID) | ORAL | Status: DC
  Administered 2019-12-15 – 2019-12-18 (×6): 20 mg via ORAL
  Filled 2019-12-15 (×6): qty 1

## 2019-12-15 MED ORDER — FENTANYL CITRATE (PF) 250 MCG/5ML IJ SOLN
INTRAMUSCULAR | Status: DC | PRN
  Administered 2019-12-15 (×5): 25 ug via INTRAVENOUS
  Administered 2019-12-15: 50 ug via INTRAVENOUS
  Administered 2019-12-15 (×3): 25 ug via INTRAVENOUS

## 2019-12-15 MED ORDER — HEPARIN SODIUM (PORCINE) 5000 UNIT/ML IJ SOLN
5000.0000 [IU] | Freq: Three times a day (TID) | INTRAMUSCULAR | Status: DC
  Administered 2019-12-15 – 2019-12-18 (×8): 5000 [IU] via SUBCUTANEOUS
  Filled 2019-12-15 (×8): qty 1

## 2019-12-15 MED ORDER — BUPIVACAINE-EPINEPHRINE 0.25% -1:200000 IJ SOLN
INTRAMUSCULAR | Status: DC | PRN
  Administered 2019-12-15: 30 mL

## 2019-12-15 MED ORDER — METRONIDAZOLE 500 MG PO TABS
1000.0000 mg | ORAL_TABLET | ORAL | Status: DC
  Filled 2019-12-15: qty 2

## 2019-12-15 MED ORDER — ACETAMINOPHEN 500 MG PO TABS
1000.0000 mg | ORAL_TABLET | ORAL | Status: AC
  Administered 2019-12-15: 1000 mg via ORAL
  Filled 2019-12-15: qty 2

## 2019-12-15 MED ORDER — SUGAMMADEX SODIUM 200 MG/2ML IV SOLN
INTRAVENOUS | Status: DC | PRN
  Administered 2019-12-15: 200 mg via INTRAVENOUS

## 2019-12-15 MED ORDER — SODIUM CHLORIDE 0.9 % IR SOLN
Status: DC | PRN
  Administered 2019-12-15: 2000 mL

## 2019-12-15 MED ORDER — HYDROMORPHONE HCL 1 MG/ML IJ SOLN
INTRAMUSCULAR | Status: AC
  Filled 2019-12-15: qty 1

## 2019-12-15 MED ORDER — ONDANSETRON HCL 4 MG/2ML IJ SOLN: 4.0000 mg | Freq: Four times a day (QID) | INTRAMUSCULAR | Status: DC | PRN

## 2019-12-15 MED ORDER — AMLODIPINE BESYLATE 5 MG PO TABS
5.0000 mg | ORAL_TABLET | Freq: Every day | ORAL | Status: DC
  Administered 2019-12-16 – 2019-12-18 (×3): 5 mg via ORAL
  Filled 2019-12-15 (×3): qty 1

## 2019-12-15 MED ORDER — ENSURE SURGERY PO LIQD
237.0000 mL | Freq: Two times a day (BID) | ORAL | Status: DC
  Administered 2019-12-16 – 2019-12-17 (×3): 237 mL via ORAL
  Filled 2019-12-15 (×6): qty 237

## 2019-12-15 MED ORDER — PHENYLEPHRINE HCL-NACL 10-0.9 MG/250ML-% IV SOLN
INTRAVENOUS | Status: DC | PRN
  Administered 2019-12-15: 35 ug/min via INTRAVENOUS

## 2019-12-15 MED ORDER — DEXMEDETOMIDINE HCL IN NACL 200 MCG/50ML IV SOLN
INTRAVENOUS | Status: AC
  Filled 2019-12-15: qty 50

## 2019-12-15 MED ORDER — ALVIMOPAN 12 MG PO CAPS
12.0000 mg | ORAL_CAPSULE | Freq: Two times a day (BID) | ORAL | Status: DC
  Administered 2019-12-16 (×2): 12 mg via ORAL
  Filled 2019-12-15 (×2): qty 1

## 2019-12-15 MED ORDER — ONDANSETRON HCL 4 MG/2ML IJ SOLN: 4.0000 mg | Freq: Once | INTRAMUSCULAR | Status: DC | PRN

## 2019-12-15 MED ORDER — ROCURONIUM BROMIDE 50 MG/5ML IV SOSY
PREFILLED_SYRINGE | INTRAVENOUS | Status: DC | PRN
  Administered 2019-12-15: 20 mg via INTRAVENOUS
  Administered 2019-12-15: 40 mg via INTRAVENOUS
  Administered 2019-12-15: 10 mg via INTRAVENOUS

## 2019-12-15 MED ORDER — LIDOCAINE HCL 2 % IJ SOLN
INTRAMUSCULAR | Status: AC
  Filled 2019-12-15: qty 20

## 2019-12-15 MED ORDER — ALBUMIN HUMAN 5 % IV SOLN
INTRAVENOUS | Status: AC
  Filled 2019-12-15: qty 250

## 2019-12-15 MED ORDER — ONDANSETRON HCL 4 MG/2ML IJ SOLN
INTRAMUSCULAR | Status: AC
  Filled 2019-12-15: qty 2

## 2019-12-15 MED ORDER — TRAMADOL HCL 50 MG PO TABS: 50.0000 mg | ORAL_TABLET | Freq: Four times a day (QID) | ORAL | Status: DC | PRN

## 2019-12-15 MED ORDER — ATORVASTATIN CALCIUM 20 MG PO TABS
20.0000 mg | ORAL_TABLET | Freq: Every day | ORAL | Status: DC
  Administered 2019-12-15 – 2019-12-17 (×3): 20 mg via ORAL
  Filled 2019-12-15 (×3): qty 1

## 2019-12-15 MED ORDER — FENTANYL CITRATE (PF) 250 MCG/5ML IJ SOLN
INTRAMUSCULAR | Status: AC
  Filled 2019-12-15: qty 5

## 2019-12-15 MED ORDER — LACTATED RINGERS IR SOLN
Status: DC | PRN
  Administered 2019-12-15: 1000 mL

## 2019-12-15 MED ORDER — LACTATED RINGERS IV SOLN: INTRAVENOUS | Status: DC

## 2019-12-15 MED ORDER — BISACODYL 5 MG PO TBEC
20.0000 mg | DELAYED_RELEASE_TABLET | Freq: Once | ORAL | Status: DC
  Filled 2019-12-15: qty 4

## 2019-12-15 MED ORDER — BUPIVACAINE LIPOSOME 1.3 % IJ SUSP
INTRAMUSCULAR | Status: DC | PRN
  Administered 2019-12-15: 20 mL

## 2019-12-15 MED ORDER — IRBESARTAN 300 MG PO TABS
300.0000 mg | ORAL_TABLET | Freq: Every day | ORAL | Status: DC
  Administered 2019-12-16 – 2019-12-18 (×3): 300 mg via ORAL
  Filled 2019-12-15 (×2): qty 1
  Filled 2019-12-15 (×2): qty 2
  Filled 2019-12-15: qty 1
  Filled 2019-12-15: qty 2

## 2019-12-15 MED ORDER — ALBUMIN HUMAN 5 % IV SOLN: INTRAVENOUS | Status: DC | PRN

## 2019-12-15 MED ORDER — LIDOCAINE 2% (20 MG/ML) 5 ML SYRINGE
INTRAMUSCULAR | Status: DC | PRN
  Administered 2019-12-15: 60 mg via INTRAVENOUS

## 2019-12-15 MED ORDER — DIPHENHYDRAMINE HCL 50 MG/ML IJ SOLN: 12.5000 mg | Freq: Four times a day (QID) | INTRAMUSCULAR | Status: DC | PRN

## 2019-12-15 MED ORDER — ROCURONIUM BROMIDE 10 MG/ML (PF) SYRINGE
PREFILLED_SYRINGE | INTRAVENOUS | Status: AC
  Filled 2019-12-15: qty 10

## 2019-12-15 MED ORDER — DEXMEDETOMIDINE HCL IN NACL 200 MCG/50ML IV SOLN
INTRAVENOUS | Status: DC | PRN
  Administered 2019-12-15 (×3): 4 ug via INTRAVENOUS

## 2019-12-15 MED ORDER — ALBUTEROL SULFATE (2.5 MG/3ML) 0.083% IN NEBU: 3.0000 mL | INHALATION_SOLUTION | RESPIRATORY_TRACT | Status: DC | PRN

## 2019-12-15 MED ORDER — FENTANYL CITRATE (PF) 100 MCG/2ML IJ SOLN
25.0000 ug | INTRAMUSCULAR | Status: DC | PRN
  Administered 2019-12-15 (×2): 50 ug via INTRAVENOUS

## 2019-12-15 MED ORDER — ALPRAZOLAM 0.5 MG PO TABS
0.5000 mg | ORAL_TABLET | Freq: Every day | ORAL | Status: DC | PRN
  Filled 2019-12-15: qty 1

## 2019-12-15 MED ORDER — ACETAMINOPHEN 10 MG/ML IV SOLN: 1000.0000 mg | Freq: Once | INTRAVENOUS | Status: DC | PRN

## 2019-12-15 MED ORDER — PROPOFOL 10 MG/ML IV BOLUS
INTRAVENOUS | Status: AC
  Filled 2019-12-15: qty 20

## 2019-12-15 MED ORDER — IBUPROFEN 200 MG PO TABS
600.0000 mg | ORAL_TABLET | Freq: Four times a day (QID) | ORAL | Status: DC | PRN
  Administered 2019-12-17: 600 mg via ORAL
  Filled 2019-12-15: qty 3

## 2019-12-15 MED ORDER — ALVIMOPAN 12 MG PO CAPS
12.0000 mg | ORAL_CAPSULE | ORAL | Status: AC
  Administered 2019-12-15: 12 mg via ORAL
  Filled 2019-12-15: qty 1

## 2019-12-15 MED ORDER — NEOMYCIN SULFATE 500 MG PO TABS
1000.0000 mg | ORAL_TABLET | ORAL | Status: DC
  Filled 2019-12-15: qty 2

## 2019-12-15 MED ORDER — SODIUM CHLORIDE 0.9 % IV SOLN
2.0000 g | INTRAVENOUS | Status: AC
  Administered 2019-12-15: 2 g via INTRAVENOUS
  Filled 2019-12-15: qty 2

## 2019-12-15 MED ORDER — BUPIVACAINE HCL 0.25 % IJ SOLN
INTRAMUSCULAR | Status: AC
  Filled 2019-12-15: qty 1

## 2019-12-15 MED ORDER — LIDOCAINE 2% (20 MG/ML) 5 ML SYRINGE
INTRAMUSCULAR | Status: AC
  Filled 2019-12-15: qty 5

## 2019-12-15 MED ORDER — FENTANYL CITRATE (PF) 100 MCG/2ML IJ SOLN
INTRAMUSCULAR | Status: AC
  Filled 2019-12-15: qty 2

## 2019-12-15 MED ORDER — CHLORHEXIDINE GLUCONATE CLOTH 2 % EX PADS: 6.0000 | MEDICATED_PAD | Freq: Once | CUTANEOUS | Status: DC

## 2019-12-15 MED ORDER — POLYETHYLENE GLYCOL 3350 17 GM/SCOOP PO POWD
1.0000 | Freq: Once | ORAL | Status: DC
  Filled 2019-12-15: qty 255

## 2019-12-15 MED ORDER — PROPOFOL 10 MG/ML IV BOLUS
INTRAVENOUS | Status: DC | PRN
  Administered 2019-12-15: 50 mg via INTRAVENOUS

## 2019-12-15 MED ORDER — HEPARIN SODIUM (PORCINE) 5000 UNIT/ML IJ SOLN
5000.0000 [IU] | Freq: Once | INTRAMUSCULAR | Status: AC
  Administered 2019-12-15: 5000 [IU] via SUBCUTANEOUS
  Filled 2019-12-15: qty 1

## 2019-12-15 MED ORDER — ACETAMINOPHEN 500 MG PO TABS
1000.0000 mg | ORAL_TABLET | Freq: Four times a day (QID) | ORAL | Status: DC
  Administered 2019-12-15 – 2019-12-18 (×10): 1000 mg via ORAL
  Filled 2019-12-15 (×11): qty 2

## 2019-12-15 MED ORDER — LIDOCAINE 2% (20 MG/ML) 5 ML SYRINGE
INTRAMUSCULAR | Status: DC | PRN
  Administered 2019-12-15: 1 mg/kg/h via INTRAVENOUS

## 2019-12-15 MED ORDER — PHENYLEPHRINE HCL (PRESSORS) 10 MG/ML IV SOLN
INTRAVENOUS | Status: AC
  Filled 2019-12-15: qty 1

## 2019-12-15 MED ORDER — ONDANSETRON HCL 4 MG PO TABS: 4.0000 mg | ORAL_TABLET | Freq: Four times a day (QID) | ORAL | Status: DC | PRN

## 2019-12-15 MED ORDER — HYDROMORPHONE HCL 1 MG/ML IJ SOLN
0.5000 mg | INTRAMUSCULAR | Status: DC | PRN
  Administered 2019-12-15: 0.5 mg via INTRAVENOUS

## 2019-12-15 MED ORDER — DEXAMETHASONE SODIUM PHOSPHATE 10 MG/ML IJ SOLN
INTRAMUSCULAR | Status: AC
  Filled 2019-12-15: qty 1

## 2019-12-15 MED ORDER — KETAMINE HCL 10 MG/ML IJ SOLN
INTRAMUSCULAR | Status: DC | PRN
  Administered 2019-12-15: 25 mg via INTRAVENOUS

## 2019-12-15 MED ORDER — PNEUMOCOCCAL VAC POLYVALENT 25 MCG/0.5ML IJ INJ
0.5000 mL | INJECTION | INTRAMUSCULAR | Status: AC
  Administered 2019-12-18: 0.5 mL via INTRAMUSCULAR
  Filled 2019-12-15: qty 0.5

## 2019-12-15 SURGICAL SUPPLY — 62 items
ADH SKN CLS APL DERMABOND .7 (GAUZE/BANDAGES/DRESSINGS) ×1
APL PRP STRL LF DISP 70% ISPRP (MISCELLANEOUS) ×1
APPLIER CLIP ROT 10 11.4 M/L (STAPLE)
APR CLP MED LRG 11.4X10 (STAPLE)
BLADE CLIPPER SURG (BLADE) IMPLANT
CABLE HIGH FREQUENCY MONO STRZ (ELECTRODE) ×6 IMPLANT
CELLS DAT CNTRL 66122 CELL SVR (MISCELLANEOUS) IMPLANT
CHLORAPREP W/TINT 26 (MISCELLANEOUS) ×3 IMPLANT
CLIP APPLIE ROT 10 11.4 M/L (STAPLE) IMPLANT
COVER WAND RF STERILE (DRAPES) IMPLANT
DECANTER SPIKE VIAL GLASS SM (MISCELLANEOUS) ×3 IMPLANT
DERMABOND ADVANCED (GAUZE/BANDAGES/DRESSINGS) ×2
DERMABOND ADVANCED .7 DNX12 (GAUZE/BANDAGES/DRESSINGS) IMPLANT
DISSECTOR BLUNT TIP ENDO 5MM (MISCELLANEOUS) IMPLANT
DRSG OPSITE POSTOP 4X6 (GAUZE/BANDAGES/DRESSINGS) ×2 IMPLANT
ELECT REM PT RETURN 15FT ADLT (MISCELLANEOUS) ×3 IMPLANT
GAUZE SPONGE 4X4 12PLY STRL (GAUZE/BANDAGES/DRESSINGS) ×3 IMPLANT
GLOVE BIO SURGEON STRL SZ7.5 (GLOVE) ×6 IMPLANT
GLOVE ECLIPSE 8.0 STRL XLNG CF (GLOVE) ×6 IMPLANT
GOWN STRL REUS W/TWL XL LVL3 (GOWN DISPOSABLE) ×12 IMPLANT
KIT TURNOVER KIT A (KITS) IMPLANT
LIGASURE IMPACT 36 18CM CVD LR (INSTRUMENTS) IMPLANT
NS IRRIG 1000ML POUR BTL (IV SOLUTION) ×3 IMPLANT
PACK COLON (CUSTOM PROCEDURE TRAY) ×3 IMPLANT
PAD POSITIONING PINK XL (MISCELLANEOUS) IMPLANT
PENCIL SMOKE EVACUATOR (MISCELLANEOUS) IMPLANT
PROTECTOR NERVE ULNAR (MISCELLANEOUS) IMPLANT
RELOAD PROXIMATE 75MM BLUE (ENDOMECHANICALS) ×6 IMPLANT
RELOAD STAPLE 75 3.8 BLU REG (ENDOMECHANICALS) IMPLANT
RETRACTOR WND ALEXIS 18 MED (MISCELLANEOUS) IMPLANT
RTRCTR WOUND ALEXIS 18CM MED (MISCELLANEOUS)
SCISSORS LAP 5X35 DISP (ENDOMECHANICALS) ×3 IMPLANT
SEALER TISSUE G2 STRG ARTC 35C (ENDOMECHANICALS) IMPLANT
SET IRRIG TUBING LAPAROSCOPIC (IRRIGATION / IRRIGATOR) IMPLANT
SET TUBE SMOKE EVAC HIGH FLOW (TUBING) ×3 IMPLANT
SHEARS HARMONIC ACE PLUS 36CM (ENDOMECHANICALS) IMPLANT
SLEEVE ADV FIXATION 5X100MM (TROCAR) ×12 IMPLANT
SLEEVE ENDOPATH XCEL 5M (ENDOMECHANICALS) ×3 IMPLANT
STAPLER PROXIMATE 75MM BLUE (STAPLE) ×2 IMPLANT
STAPLER VISISTAT 35W (STAPLE) ×3 IMPLANT
SUT MNCRL AB 4-0 PS2 18 (SUTURE) ×3 IMPLANT
SUT PDS AB 1 CT1 27 (SUTURE) ×6 IMPLANT
SUT PROLENE 2 0 CT2 30 (SUTURE) IMPLANT
SUT PROLENE 2 0 KS (SUTURE) IMPLANT
SUT SILK 2 0 (SUTURE) ×3
SUT SILK 2 0 SH CR/8 (SUTURE) ×3 IMPLANT
SUT SILK 2-0 18XBRD TIE 12 (SUTURE) ×1 IMPLANT
SUT SILK 3 0 (SUTURE) ×3
SUT SILK 3 0 SH CR/8 (SUTURE) ×3 IMPLANT
SUT SILK 3-0 18XBRD TIE 12 (SUTURE) ×1 IMPLANT
SYS LAPSCP GELPORT 120MM (MISCELLANEOUS)
SYSTEM LAPSCP GELPORT 120MM (MISCELLANEOUS) IMPLANT
TAPE CLOTH 4X10 WHT NS (GAUZE/BANDAGES/DRESSINGS) IMPLANT
TOWEL OR 17X26 10 PK STRL BLUE (TOWEL DISPOSABLE) ×3 IMPLANT
TRAY FOLEY MTR SLVR 14FR STAT (SET/KITS/TRAYS/PACK) ×3 IMPLANT
TRAY FOLEY MTR SLVR 16FR STAT (SET/KITS/TRAYS/PACK) IMPLANT
TROCAR ADV FIXATION 5X100MM (TROCAR) ×3 IMPLANT
TROCAR BALLN 12MMX100 BLUNT (TROCAR) ×3 IMPLANT
TROCAR XCEL NON-BLD 11X100MML (ENDOMECHANICALS) IMPLANT
TUBING CONNECTING 10 (TUBING) ×4 IMPLANT
TUBING CONNECTING 10' (TUBING) ×2
YANKAUER SUCT BULB TIP NO VENT (SUCTIONS) ×3 IMPLANT

## 2019-12-15 NOTE — Transfer of Care (Signed)
Immediate Anesthesia Transfer of Care Note  Patient: Jamie [name]  Procedure(s) Performed: LAPAROSCOPIC RIGHT HEMI COLECTOMY WITH LYSIS OF ADHESIONS (Right Abdomen)  Patient Location: PACU  Anesthesia Type:General  Level of Consciousness: awake, alert  and oriented  Airway & Oxygen Therapy: Patient Spontanous Breathing and Patient connected to face mask oxygen  Post-op Assessment: Report given to RN  Post vital signs: Reviewed and stable  Last Vitals:  Vitals Value Taken Time  BP 103/73 12/15/19 1323  Temp    Pulse 77 12/15/19 1326  Resp 12 12/15/19 1326  SpO2 85 % 12/15/19 1326  Vitals shown include unvalidated device data.  Last Pain:  Vitals:   12/15/19 0924  TempSrc:   PainSc: 0-No pain         Complications: No apparent anesthesia complications

## 2019-12-15 NOTE — H&P (Signed)
CC: Cecal adenocarcinoma - here today for surgery  HPI: Ms. Golonka is a very pleasant 74yoF with hx of HTN, bronchitis, GERD, anxiety, subsegmental PE, who reports a one-year history of indigestion-like symptoms and occasional bloating. She ultimately was found also have some degree of anemia with a hemoglobin of 10.8 which triggered workup. She had a CAT scan done of her abdomen and pelvis 09/26/2019 which showed a large mass in the cecum. She underwent colonoscopy one 10/04/2019 which demonstrated a fungating, infiltrative, polypoid ulcerated mass in the cecum. Biopsies were taken and returned adenocarcinoma. She is also found to have diverticula in the sigmoid but otherwise no abnormalities. She then had her staging workup completed with a CT chest. This demonstrated no evidence of metastatic disease in the chest but there was a small nonocclusive pulmonary embolus and a segmental left upper lobe pulmonary artery and subsegmental pulmonary artery to the left lower lobe. These are felt to be subacute or chronic. Probable trace nonobstructive embolus and subsegmental right lower lobe. Her CT abdomen did not demonstrate evidence of metastatic disease in the abdomen. She denies any blood in her stool or black/tarry stool. She otherwise feels well. She denies any abdominal pain.  Albumin 4.0 CEA 5.1  INTERVAL HX She has been off her blood thinner since 11/20/2019 - stated she had run out and had asked about refill and was told not necessary by oncology. She denies any other health issues or changes in her health since we met in the office. Denies n/v. Tolerated her prep without difficulty. Denies any complaints today.  PMH:HTN (well controlled on oral antihypertensive), bronchitis, GERD, anxiety, incidentally found subsegmental PE (started yesterday on Eliquis by Dr. Burr Medico - Dr. Burr Medico indicated to me via Epic message that she would be cleared to stop this medication 3 days before surgery)   PSH: TAH/BSO-low midline incision  FHx: Denies FHx of malignancy  Social: Denies use of tobacco/EtOH/drugs. She is a reformed smoker-quit in 1991. 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 She lives at home and her 3 children live with her.  ROS: A comprehensive 10 system review of systems was completed with the patient and pertinent findings as noted above.  Past Medical History:  Diagnosis Date  . Adenocarcinoma of cecum ([place]) 09/28/2019  . ANEMIA, IRON DEFICIENCY 09/10/2007  . Anxiety   . Arthritis   . Asthma   . Blood transfusion without reported diagnosis   . Cataract 2008   bilateral cataract extraction  . CATARACT EXTRACTION, HX OF 09/10/2007  . DYSPEPSIA, HX OF 09/10/2007  . GERD (gastroesophageal reflux disease)   . HAIR LOSS 03/21/2009  . HAY FEVER 09/10/2007  . HEARING LOSS, SENSORINEURAL 09/10/2007  . HYPERLIPIDEMIA 09/10/2007  . HYPERTENSION 09/10/2007  . LOW BACK PAIN 05/10/2008  . Pain in joint, hand 03/01/2008  . PEPTIC ULCER DISEASE 09/10/2007  . POLYP, COLON 09/10/2007  . POLYPECTOMY, HX OF 09/10/2007  . Pulmonary embolism ([place]) 1- 2 non-occlusive 10/10/2019  . SHINGLES 08/13/2009  . SHOULDER PAIN, LEFT 02/12/2008  . TAH/BSO, HX OF 09/10/2007  . Tuberculosis    PATIENT DENIES AT PHONE INTERVIEW ON 12/12/2019    Past Surgical History:  Procedure Laterality Date  . ABDOMINAL HYSTERECTOMY    . BREAST SURGERY     breast biopsy-benign  . CATARACT EXTRACTION    . COLONOSCOPY    . EYE SURGERY     bilateral cataract with lens implant  . IRRIGATION AND DEBRIDEMENT SEBACEOUS CYST    .  TOTAL ABDOMINAL HYSTERECTOMY W/ BILATERAL SALPINGOOPHORECTOMY      Family History  Problem Relation Age of Onset  . Hypertension Mother   . Coronary artery disease Mother   . Heart disease Mother        CHF  . Cancer Son 20       prostate cancer   . Cancer Son 63       prostate cancer  . Diabetes Neg Hx     Social:   reports that she quit smoking about 30 years ago. Her smoking use included cigarettes. She has a 5.00 pack-year smoking history. She has never used smokeless tobacco. She reports that she does not drink alcohol or use drugs.  Allergies:  Allergies  Allergen Reactions  . Aspirin Other (See Comments)    Stomach upset  . Latex Itching and Rash    Medications: I have reviewed the patient's current medications.  No results found for this or any previous visit (from the past 48 hour(s)).  No results found.  ROS - all of the below systems have been reviewed with the patient and positives are indicated with bold text General: chills, fever or night sweats Eyes: blurry vision or double vision ENT: epistaxis or sore throat Allergy/Immunology: itchy/watery eyes or nasal congestion Hematologic/Lymphatic: bleeding problems, blood clots or swollen lymph nodes Endocrine: temperature intolerance or unexpected weight changes Breast: new or changing breast lumps or nipple discharge Resp: cough, shortness of breath, or wheezing CV: chest pain or dyspnea on exertion GI: as per HPI GU: dysuria, trouble voiding, or hematuria MSK: joint pain or joint stiffness Neuro: TIA or stroke symptoms Derm: pruritus and skin lesion changes Psych: anxiety and depression  PE Blood pressure (!) 142/59, pulse (!) 102, temperature 98.1 F (36.7 C), temperature source Oral, resp. rate 16, SpO2 100 %. Constitutional: NAD; conversant; wearing mask Eyes: Moist conjunctiva; no lid lag; anicteric; pupils equal and round Lungs: Normal respiratory effort CV: RRR GI: Abd soft, NT/ND; no palpable hepatosplenomegaly Psychiatric: Appropriate affect; alert and oriented x3  No results found for this or any previous visit (from the past 48 hour(s)).  No results found.  A/P: Ms. Metzger is a very pleasant 9yoF with hx of HTN, bronchitis, GERD, anxiety, incidentally found subsegmental pulmonary emboli - with newly diagnosed  adenocarcinoma of the cecum. CT C/A/P demonstrate no evidence of metastatic disease  CEA 5.1 Alb 3.9  -She has been off her blood thinner since 11/20/2019 as per oncology per patient -The anatomy and physiology of the GI tract was discussed at length with her again today. The pathophysiology of colon cancer was discussed at length pictures as well -We discussed right hemicolectomy (laparoscopic and potential open techniques) and the rationale therein. We discussed that without surgery, this would likely continue to grow and will ultimately spread and take her life. There is no "curative" options short of surgery. We did discuss chemotherapy. We also reviewed scenarios where chemotherapy is recommended after surgery for patients with locally advanced/regional disease. -The planned procedure, material risks (including, but not limited to, pain, bleeding, infection, scarring, need for blood transfusion particularly in setting of baseline anemia, damage to surrounding structures- blood vessels/nerves/viscus/organs, damage to ureter, leak from anastomosis, need for additional procedures, need for stoma which may be permanent, hernia, recurrence of cancer even after surgery, pneumonia, blood clots and pulmonary embolus, heart attack, stroke, death) benefits and alternatives to surgery were discussed at length. The patient's questions were answered to her satisfaction, she voiced understanding and elected to  proceed with surgery. Additionally, we discussed typical postoperative expectations and the recovery process.  Sharon Mt. Dema Severin, M.D. [facility] Use [email or web address] to contact on call provider

## 2019-12-15 NOTE — Anesthesia Procedure Notes (Signed)
Procedure Name: Intubation Date/Time: 12/15/2019 10:24 AM Performed by: Maxwell Caul, CRNA Pre-anesthesia Checklist: Patient identified, Emergency Drugs available, Suction available and Patient being monitored Patient Re-evaluated:Patient Re-evaluated prior to induction Oxygen Delivery Method: Circle system utilized Preoxygenation: Pre-oxygenation with 100% oxygen Induction Type: IV induction Ventilation: Mask ventilation without difficulty Laryngoscope Size: Mac and 3 Grade View: Grade I Tube type: Oral Tube size: 7.0 mm Number of attempts: 1 Airway Equipment and Method: Stylet Placement Confirmation: ETT inserted through vocal cords under direct vision,  positive ETCO2 and breath sounds checked- equal and bilateral Secured at: 21 cm Tube secured with: Tape Dental Injury: Teeth and Oropharynx as per pre-operative assessment

## 2019-12-15 NOTE — Op Note (Signed)
[name]: Jamie [name]  86 y.o. female  [name] Care Team: Hoyt Koch, MD as PCP - General (Internal Medicine) Karl Luke, MD as Referring Physician (Optometry) Dema Severin Sharon Mt, MD as Consulting Physician (General Surgery) Truitt Merle, MD as Consulting Physician (Hematology) Gatha Mayer, MD as Consulting Physician (Gastroenterology)  PREOP DIAGNOSIS: Cecal colon cancer, adenocarcinoma  POSTOP DIAGNOSIS: Same  PROCEDURE: 1. Laparoscopic right hemicolectomy 2. Laparoscopic lysis of adhesions x 90 minutes  SURGEON: Sharon Mt. Arionna Hoggard, MD  ASSISTANT: Gurney Maxin, MD  ANESTHESIA: General endotracheal  EBL: 75 mL Total I/O In: 2150 [I.V.:1800; IV Piggyback:350] Out: 625 [Urine:575; Blood:50]  DRAINS: None  SPECIMEN: Terminal ileum, right colon with appendix  COUNTS: Sponge, needle and instrument counts were reported correct x2  FINDINGS: No obvious metastatic disease on visceral parietal peritoneum or liver. Significant adhesions in pelvis between small bowel and prior hysterectomy site as well as small bowel and low midline abdomen. Partial adhesiolysis carried out to Jamie extent necessary to perform right hemicolectomy. She has some remaining small bowel adhesions in Jamie pelvis as well as below Jamie umbilicus. Large mass in proximal ascending colon at junction with cecum. Right hemicolectomy carried out, taking Jamie right branch of Jamie middle colic vessels. Stapled ileo-transverse anastomosis  STATEMENT OF MEDICAL NECESSITY: Jamie [name] is a very pleasant 86yoF with hx of HTN, bronchitis, GERD, anxiety, subsegmental PE, who reports a one-year history of indigestion-like symptoms and occasional bloating. She ultimately was found also have some degree of anemia with a hemoglobin of 10.8 which triggered workup. She had a CAT scan done of her abdomen and pelvis 09/26/2019 which showed a large mass in Jamie cecum. She underwent colonoscopy on 10/04/2019 which  demonstrated a fungating, infiltrative, polypoid ulcerated mass in Jamie cecum. Biopsies were taken and returned adenocarcinoma. She was also found to have diverticula in Jamie sigmoid but otherwise no abnormalities. She then had her staging workup completed with a CT chest. This demonstrated no evidence of metastatic disease in Jamie chest but there was a small nonocclusive pulmonary embolus and a segmental left upper lobe pulmonary artery and subsegmental pulmonary artery to Jamie left lower lobe. These are felt to be subacute or chronic. Probable trace nonobstructive embolus and subsegmental right lower lobe. Her CT abdomen did not demonstrate evidence of metastatic disease in Jamie abdomen.   She was seen and evaluated. We had discussed at length her diagnosis of colon cancer and options for management. She opted to proceed with surgery. Please refer to notes elsewhere for details regarding this discussion.   NARRATIVE:  Jamie [name] was identified & brought into Jamie operating room, placed supine on Jamie operating table and SCDs were applied to Jamie lower extremities. General endotracheal anesthesia was induced. She was positioned supine with arms tucked. Antibiotics were administered. A foley catheter was placed under sterile conditions. Jamie abdomen was prepped and draped in a sterile fashion. A timeout was performed confirming [name], procedure, plan and antibiotics being administered.   Beginning with Jamie extraction port, a supraumbilical incision was made and carried down to Jamie midline fascia. This was then incised with electrocautery. Jamie peritoneum was identified and elevated between clamps and carefully opened sharply. A small Alexis wound protector with a cap and associated port was then placed. Jamie abdomen was insufflated to 15 mmHg with CO2. A laparoscope was placed and camera inspection revealed no evidence of injury. Bilateral TAP blocks were then performed under laparoscopic visualization using  a mixture of 0.25% marcaine with epinepherine +  Exparel. 4 additional 5 mm ports were ultimately placed under direct laparoscopic visualization - two in Jamie left hemiabdomen and two in Jamie right abdomen. Jamie abdomen was surveyed. Jamie liver and peritoneum appeared normal.  There were no signs of metastatic disease. There were significant adhesions between small bowel and low midline which were carefully, partially taken down sharply to facilitate exposure of terminal ileum. She also had adhesions in Jamie pelvis between Jamie beds of her hysterectomy surgery and small bowel. Some of this was distal ileum. To Jamie extent necessary for an ileo-transverse colon anastomosis, theses were sharply lysed with endoshears. This was tedious but care was taken and no deserosalizations were apparent following completion of this and meticulous inspection of Jamie small bowel. This took 90 minutes in total.  Jamie [name] was then positioned in trendelenburg with left side down. Jamie appendix was freed from some pelvic adhesions as well. A lateral to medial approach was employed. Jamie terminal ileum, cecum and ascending colon were mobilized medially by taking down Jamie Trula Frede line of T[place]t. Jamie hepatic flexure was partially taken down as well. At this point, she was repositioned in reverse trendelenburg. Jamie omentum was carefully lysed from some adhesions to Jamie gallbladder. There was no apparent injury to Jamie gallbladder during this process. Jamie gallbladder grossly appeared normal as well. Jamie lesser sac was entered and Jamie omentum reflected cephalad off Jamie colon. Jamie duodenum was identified and protected free of injury. Jamie mesentery was mobilized off Jamie anterior portion of Jamie duodenum. Jamie proximal transverse and hepatic flexure was then taken down with Jamie Enseal. At this point, Jamie right colon was fully mobilized. Attention was turned to Jamie extracorporeal portion of Jamie procedure. Jamie abdomen desufflated and Jamie cecum delivered onto  Jamie field. Jamie mass was palpable in Jamie proximal ascending colon. There was no apparent extension beyond Jamie colon. Jamie distal ileum was divided with a GIA blue load 75 mm stapler and a Babcock was gently placed on Jamie ileum to maintain orientation. Jamie middle colic vessels were identified and had a palpable pulse. Jamie right branch was identified. Including this with Jamie specimen, a window created and Jamie colon divided using a 75 mm blue load GIA stapler. Jamie intervening mesentery was ligated using Jamie Enseal, again taking Jamie right branch of Jamie middle colic as well as Jamie ileocolic pedicle near its origin. These were then observed and hemostasis appreciated. Jamie specimen was passed off.  Attention was turned to creating Jamie anastomosis. Jamie terminal ileum and transverse colon were inspected for orientation to ensure no twisting nor bowel included in Jamie mesenteric defect. This easily reached Jamie transverse colon without tension and Jamie transverse colon was quite mobile. Therefore, further small bowel adhesions were not lysed. An anastomosis was created between Jamie terminal ileum and Jamie transverse colon using a 75 mm GIA blue load stapler. Jamie staple line was inspected and noted to be hemostatic.  Jamie common enterotomy channel was closed using a TA 90 blue load stapler. Jamie staple line was complete and hemostatic. 3-0 silk sutures were used to imbricate Jamie corners of Jamie staple line as well.  A 3-0 silk suture was placed securing Jamie "crotch" of Jamie anastomosis. Jamie anastomosis was palpated and noted to be widely patent. This was then placed back into Jamie abdomen. Jamie abdomen was then irrigated with sterile saline and hemostasis verified. Jamie omentum was then brought down over Jamie anastomosis. Jamie laparoscopic ports were removed under direct visualization and Jamie sites noted  to be hemostatic. Jamie Alexis wound protector was removed, counts were reported correct, and we switched to clean instruments, gowns and  drapes.   Jamie fascia was then closed using two running #1 PDS sutures.  Jamie skin of all incision sites was closed with 4-0 monocryl subcuticular suture. Dermabond was placed on Jamie port sites and a sterile dressing was placed over Jamie abdominal incision. All counts were again reported correct. She was then awakened from anesthesia, extubated and sent to Jamie post anesthesia care unit in stable condition.   DISPOSITION: PACU in satisfactory condition

## 2019-12-15 NOTE — Anesthesia Postprocedure Evaluation (Signed)
Anesthesia Post Note  Patient: Jamie [name]  Procedure(s) Performed: LAPAROSCOPIC RIGHT HEMI COLECTOMY WITH LYSIS OF ADHESIONS (Right Abdomen)     Patient location during evaluation: PACU Anesthesia Type: General Level of consciousness: awake and alert Pain management: pain level controlled Vital Signs Assessment: post-procedure vital signs reviewed and stable Respiratory status: spontaneous breathing, nonlabored ventilation, respiratory function stable and patient connected to nasal cannula oxygen Cardiovascular status: blood pressure returned to baseline and stable Postop Assessment: no apparent nausea or vomiting Anesthetic complications: no    Last Vitals:  Vitals:   12/15/19 1629 12/15/19 1731  BP: (!) 107/59 120/61  Pulse: 81 83  Resp: 16 16  Temp: 36.6 C 36.6 C  SpO2: 100% 100%    Last Pain:  Vitals:   12/15/19 1731  TempSrc: Oral  PainSc:                  Barnet [place]

## 2019-12-16 ENCOUNTER — Telehealth: Payer: Self-pay | Admitting: Hematology

## 2019-12-16 LAB — CBC
HCT: 23.9 % — ABNORMAL LOW (ref 36.0–46.0)
Hemoglobin: 7.2 g/dL — ABNORMAL LOW (ref 12.0–15.0)
MCH: 26.7 pg (ref 26.0–34.0)
MCHC: 30.1 g/dL (ref 30.0–36.0)
MCV: 88.5 fL (ref 80.0–100.0)
Platelets: 250 10*3/uL (ref 150–400)
RBC: 2.7 MIL/uL — ABNORMAL LOW (ref 3.87–5.11)
RDW: 14.6 % (ref 11.5–15.5)
WBC: 13.3 10*3/uL — ABNORMAL HIGH (ref 4.0–10.5)
nRBC: 0 % (ref 0.0–0.2)

## 2019-12-16 LAB — BASIC METABOLIC PANEL
Anion gap: 5 (ref 5–15)
BUN: 7 mg/dL — ABNORMAL LOW (ref 8–23)
CO2: 22 mmol/L (ref 22–32)
Calcium: 8.6 mg/dL — ABNORMAL LOW (ref 8.9–10.3)
Chloride: 112 mmol/L — ABNORMAL HIGH (ref 98–111)
Creatinine, Ser: 0.69 mg/dL (ref 0.44–1.00)
GFR calc Af Amer: >60 mL/min (ref >60)
GFR calc non Af Amer: >60 mL/min (ref >60)
Glucose, Bld: 138 mg/dL — ABNORMAL HIGH (ref 70–99)
Potassium: 4 mmol/L (ref 3.5–5.1)
Sodium: 139 mmol/L (ref 135–145)

## 2019-12-16 MED ORDER — TRAMADOL HCL 50 MG PO TABS: 50.0000 mg | ORAL_TABLET | Freq: Four times a day (QID) | ORAL | 0 refills | Status: AC | PRN

## 2019-12-16 MED ORDER — APIXABAN 2.5 MG PO TABS: 2.5000 mg | ORAL_TABLET | Freq: Two times a day (BID) | ORAL | 0 refills | Status: DC

## 2019-12-16 MED ORDER — FERROUS SULFATE 325 (65 FE) MG PO TABS
325.0000 mg | ORAL_TABLET | Freq: Two times a day (BID) | ORAL | Status: DC
  Administered 2019-12-16 – 2019-12-18 (×5): 325 mg via ORAL
  Filled 2019-12-16 (×4): qty 1

## 2019-12-16 MED ORDER — ASCORBIC ACID 500 MG PO TABS
500.0000 mg | ORAL_TABLET | Freq: Every day | ORAL | Status: DC
  Administered 2019-12-16 – 2019-12-18 (×3): 500 mg via ORAL
  Filled 2019-12-16 (×3): qty 1

## 2019-12-16 NOTE — Telephone Encounter (Signed)
Scheduled per 1/15 sch msg. Pt in hospital mailing printout. Pt will receive printout at DC

## 2019-12-16 NOTE — Progress Notes (Addendum)
Subjective No acute events. Feeling quite well. Denies any abdominal pain. Denies any bloating. Denies flatus/BM as of yet. Has tried sips of liquids last night but not clear tray - ordered this AM. Has been up out of bed on her own last night and walked in hallway.  Objective: Vital signs in last 24 hours: Temp:  [96.6 F (35.9 C)-98 F (36.7 C)] 98 F (36.7 C) (01/15 0532) Pulse Rate:  [75-91] 75 (01/15 0532) Resp:  [11-26] 16 (01/15 0532) BP: (103-122)/(50-73) 112/59 (01/15 0532) SpO2:  [93 %-100 %] 100 % (01/15 0532) Weight:  [68.6 kg-72.1 kg] 72.1 kg (01/15 0532) Last BM Date: 12/15/19  Intake/Output from previous day: 01/14 0701 - 01/15 0700 In: 3260 [P.O.:410; I.V.:2500; IV Piggyback:350] Out: 2425 [Urine:2375; Blood:50] Intake/Output this shift: No intake/output data recorded.  Gen: NAD, comfortable CV: RRR Pulm: Normal work of breathing Abd: Soft, nontender; nondistended; incisions c/d/i without erythema Ext: SCDs in place  Lab Results: CBC  Recent Labs    12/16/19 0406  WBC 13.3*  HGB 7.2*  HCT 23.9*  PLT 250   BMET Recent Labs    12/16/19 0406  NA 139  K 4.0  CL 112*  CO2 22  GLUCOSE 138*  BUN 7*  CREATININE 0.69  CALCIUM 8.6*   PT/INR No results for input(s): LABPROT, INR in the last 72 hours. ABG No results for input(s): PHART, HCO3 in the last 72 hours.  Invalid input(s): PCO2, PO2  Studies/Results:  Anti-infectives: Anti-infectives (From admission, onward)   Start     Dose/Rate Route Frequency Ordered Stop   12/15/19 1400  neomycin (MYCIFRADIN) tablet 1,000 mg  Status:  Discontinued     1,000 mg Oral 3 times per day 12/15/19 0850 12/15/19 1511   12/15/19 1400  metroNIDAZOLE (FLAGYL) tablet 1,000 mg  Status:  Discontinued     1,000 mg Oral 3 times per day 12/15/19 0850 12/15/19 1511   12/15/19 0900  cefoTEtan (CEFOTAN) 2 g in sodium chloride 0.9 % 100 mL IVPB     2 g 200 mL/hr over 30 Minutes Intravenous On call to O.R. 12/15/19 0850  12/15/19 2315       Assessment/Plan: Patient Active Problem List   Diagnosis Date Noted  . Colon cancer ([place]) 12/15/2019  . Pelvic pain 11/17/2019  . Pulmonary thrombosis ([place]) 10/17/2019  . Pulmonary embolism ([place]) 1- 2 non-occlusive 10/10/2019  . Adenocarcinoma of cecum ([place]) 09/28/2019  . Normocytic anemia 09/28/2019  . Bloating 09/15/2019  . Mild persistent asthma 04/09/2018  . Degenerative arthritis of left knee 05/26/2017  . Degenerative tear of left medial meniscus 05/01/2017  . Anxiety 01/08/2017  . Arthritis of carpometacarpal (CMC) joints of both thumbs 06/22/2014  . Routine health maintenance 08/09/2011  . Alopecia 03/21/2009  . Hyperlipidemia 09/10/2007  . Essential hypertension 09/10/2007  . GERD (gastroesophageal reflux disease) 09/10/2007   s/p Procedure(s): LAPAROSCOPIC RIGHT HEMI COLECTOMY WITH LYSIS OF ADHESIONS 12/15/2019 - for adenocarcinoma of proximal ascending colon/cecum.  -Recovering well -Clear liquids, advance to fulls and then soft diet as tolerated. Decreased MIVF. If tolerating liquids without issue, can d/c IVF -PT/OT -Anemia of chronic disease - had bleeding colon cancer - now removed. Hgb 7.2 from 8.4; repeat labs tomorrow. Ordered Iron + vit C -Awaiting return of bowel function - continue Entereg at this time -Home meds reordered including Lasix -PPx: SCDs, SQH; she was on therapeutic PO anticoagulation but has been off this now for 3 weeks - this was ordered initially by medical oncology  for subsegmental PE and then discontinued. I spoke with Dr. Burr Medico - she would like her to restart Eliquis on discharge and continue for 6 more weeks following and then likely no further therapy planned. -We reviewed her surgery, findings and plan moving forward at bedside this morning   LOS: 1 day   Sharon Mt. Dema Severin, M.D. [facility] Use [email or web address] to contact on call provider

## 2019-12-16 NOTE — Evaluation (Signed)
Occupational Therapy Evaluation Patient Details Name: Jamie [name] MRN: [id number] DOB: 31-May-1933 Today's Date: 12/16/2019    History of Present Illness 71yoF with hx of HTN, bronchitis, GERD, anxiety, incidentally found subsegmental pulmonary emboli - with newly diagnosed adenocarcinoma of the cecum. CT C/A/P demonstrate no evidence of metastatic disease. Pt is now s/p Laparoscopic right hemicolectomy.   Clinical Impression   Pt admitted with the above diagnoses and presents with below problem list. Pt will benefit from continued acute OT to address the below listed deficits and maximize independence with basic ADLs prior to d/c home with family. PTA pt was independent with household mobility, cane for community mobility, no physical assist needed with basic ADLs other than daughter assisting with tub transfers. Pt has family present 24/7. Pt currently setup with UB ADLs, min guard with LB ADLs and functional mobility/trasnfers. Utilized rw this session. Pt tolerated session well and left with PT ambulating in the hall.      Follow Up Recommendations  No OT follow up;Supervision/Assistance - 24 hour    Equipment Recommendations  None recommended by OT    Recommendations for Other Services       Precautions / Restrictions Precautions Precautions: Fall Restrictions Weight Bearing Restrictions: No      Mobility Bed Mobility Overal bed mobility: Needs Assistance Bed Mobility: Supine to Sit     Supine to sit: Min guard;HOB elevated     General bed mobility comments: HOB elevated. slow, guarded movements but no physical assist needed  Transfers Overall transfer level: Needs assistance Equipment used: Rolling walker (2 wheeled) Transfers: Sit to/from Stand Sit to Stand: Min guard         General transfer comment: to/from EOB,     Balance Overall balance assessment: Needs assistance Sitting-balance support: Feet supported Sitting balance-Leahy Scale: Fair      Standing balance support: No upper extremity supported;Bilateral upper extremity supported Standing balance-Leahy Scale: Fair Standing balance comment: can static stand with no external support, rw for walking                           ADL either performed or assessed with clinical judgement   ADL Overall ADL's : Needs assistance/impaired Eating/Feeding: Set up;Sitting   Grooming: Set up;Min guard;Sitting;Standing   Upper Body Bathing: Set up;Sitting   Lower Body Bathing: Min guard;Sit to/from stand   Upper Body Dressing : Set up;Sitting   Lower Body Dressing: Min guard;Sit to/from stand   Toilet Transfer: Min guard;Ambulation;RW   Toileting- Water quality scientist and Hygiene: Min guard;Sit to/from stand   Tub/ Shower Transfer: Min guard;Ambulation;Shower seat;Rolling walker   Functional mobility during ADLs: Min guard;Rolling walker General ADL Comments: Pt completed bed mobility, toilet transfer, pericare, grooming task at sink and began ambulation inthe hall. Pt tolerated session fairly well. RW used for stability. Pt left with PT.     Vision         Perception     Praxis      Pertinent Vitals/Pain Pain Assessment: Faces Faces Pain Scale: Hurts little more Pain Location: abdomen Pain Descriptors / Indicators: Guarding;Sore Pain Intervention(s): Monitored during session;Repositioned     Hand Dominance     Extremity/Trunk Assessment Upper Extremity Assessment Upper Extremity Assessment: Overall WFL for tasks assessed   Lower Extremity Assessment Lower Extremity Assessment: Defer to PT evaluation       Communication Communication Communication: No difficulties   Cognition Arousal/Alertness: Awake/alert Behavior During Therapy: [facility] for tasks assessed/performed  Overall Cognitive Status: Within Functional Limits for tasks assessed                                     General Comments       Exercises     Shoulder  Instructions      Home Living Family/patient expects to be discharged to:: Private residence Living Arrangements: Children Available Help at Discharge: Family;Available 24 hours/day Type of Home: House Home Access: Stairs to enter [employer] of Steps: 5 Entrance Stairs-Rails: Right;Left Home Layout: One level     Bathroom Shower/Tub: Tub/shower unit         Home Equipment: Environmental consultant - 2 wheels;Cane - single point;Shower seat          Prior Functioning/Environment Level of Independence: Independent with assistive device(s);Needs assistance  Gait / Transfers Assistance Needed: no AD to walk around house. cane for community mobility. Daughter assists with tub transfers.               OT Problem List: Decreased strength;Decreased activity tolerance;Impaired balance (sitting and/or standing);Decreased knowledge of use of DME or AE;Decreased knowledge of precautions;Pain      OT Treatment/Interventions: Self-care/ADL training;DME and/or AE instruction;Therapeutic activities;Patient/family education;Balance training    OT Goals(Current goals can be found in the care plan section) Acute Rehab OT Goals Patient Stated Goal: feel better, home OT Goal Formulation: With patient Time For Goal Achievement: 12/30/19 Potential to Achieve Goals: Good ADL Goals Pt Will Perform Lower Body Bathing: with modified independence;sit to/from stand Pt Will Perform Lower Body Dressing: with modified independence;sit to/from stand Pt Will Transfer to Toilet: with modified independence;ambulating Pt Will Perform Toileting - Clothing Manipulation and hygiene: with modified independence;sit to/from stand Pt Will Perform Tub/Shower Transfer: with min guard assist;Tub transfer;ambulating;shower seat;rolling walker  OT Frequency: Min 2X/week   Barriers to D/C:            Co-evaluation              AM-PAC OT "6 Clicks" Daily Activity     Outcome Measure Help from another person  eating meals?: None Help from another person taking care of personal grooming?: None Help from another person toileting, which includes using toliet, bedpan, or urinal?: A Little Help from another person bathing (including washing, rinsing, drying)?: A Little Help from another person to put on and taking off regular upper body clothing?: None Help from another person to put on and taking off regular lower body clothing?: A Little 6 Click Score: 21   End of Session Equipment Utilized During Treatment: Rolling walker  Activity Tolerance: Patient tolerated treatment well Patient left: Other (comment)(walking with PT)  OT Visit Diagnosis: Unsteadiness on feet (R26.81);Muscle weakness (generalized) (M62.81);Pain                Time: 0905-0920 OT Time Calculation (min): 15 min Charges:  OT General Charges $OT Visit: 1 Visit OT Evaluation $OT Eval Low Complexity: [place], OT Acute Rehabilitation Services Pager: [phone] Office: [phone]   Hortencia Pilar 12/16/2019, 10:06 AM

## 2019-12-16 NOTE — Evaluation (Signed)
Physical Therapy Evaluation Patient Details Name: Jamie [name] MRN: [id number] DOB: 21-Sep-1933 Today's Date: 12/16/2019   History of Present Illness  36yoF with hx of HTN, bronchitis, GERD, anxiety, incidentally found subsegmental pulmonary emboli - with newly diagnosed adenocarcinoma of the cecum. CT C/A/P demonstrate no evidence of metastatic disease. Pt is now s/p Laparoscopic right hemicolectomy.  Clinical Impression   Pt presents with moderate abdominal pain, increased time and effort to perform mobility tasks, and mild unsteadiness in standing post-operatively with pt use of RW. Pt to benefit from acute PT to address deficits. Pt ambulated great hallway distance with no physical assist required for safety, and tolerated moderate challenges to gait including head turning, direction changes, and taking large steps. Pt also proficiently navigated stairs this session to ensure ability to enter home upon [name]/c. No PT follow up post-acutely recommended at this time, PT expects pt to continue to mobilize well and progress.  PT to progress mobility as tolerated, and will continue to follow acutely.      Follow Up Recommendations No PT follow up;Supervision for mobility/OOB    Equipment Recommendations  None recommended by PT    Recommendations for Other Services       Precautions / Restrictions Precautions Precautions: Fall Restrictions Weight Bearing Restrictions: No      Mobility  Bed Mobility Overal bed mobility: Needs Assistance Bed Mobility: Supine to Sit     Supine to sit: Min guard;HOB elevated     General bed mobility comments: pt received up with OT; verbally reviewed abdominal precautions and log roll technique for abdominal protection and comfort  Transfers Overall transfer level: Needs assistance Equipment used: Rolling walker (2 wheeled) Transfers: Sit to/from Stand Sit to Stand: Min guard         General transfer comment: min guard for stand to sit for  safety, verbal cuing for hand placement when sitting.  Ambulation/Gait Ambulation/Gait assistance: Min guard;Supervision Gait Distance (Feet): 400 Feet Assistive device: Rolling walker (2 wheeled) Gait Pattern/deviations: Step-through pattern;Decreased stride length;Trunk flexed Gait velocity: slightly decr, improved with further ambulation distance   General Gait Details: min guard to supervision for safety, verbal cuing for upright posture, placement in RW.  Stairs Stairs: Yes Stairs assistance: Min guard Stair Management: Two rails;Forwards Number of Stairs: 3 General stair comments: min guard for safety, step-over-step pattern assumed by pt with increased time to perform. No evidence of unsteadiness or imbalance  Wheelchair Mobility    Modified Rankin (Stroke Patients Only)       Balance Overall balance assessment: Needs assistance Sitting-balance support: Feet supported Sitting balance-Leahy Scale: Fair     Standing balance support: No upper extremity supported;Bilateral upper extremity supported Standing balance-Leahy Scale: Fair Standing balance comment: can static stand with no external support, rw for walking                             Pertinent Vitals/Pain Pain Assessment: 0-10 Pain Score: 5  Faces Pain Scale: Hurts little more Pain Location: abdomen Pain Descriptors / Indicators: Guarding;Sore Pain Intervention(s): Limited activity within patient's tolerance;Monitored during session;Repositioned    Home Living Family/patient expects to be discharged to:: Private residence Living Arrangements: Children Available Help at Discharge: Family;Available 24 hours/day Type of Home: House Home Access: Stairs to enter Entrance Stairs-Rails: Psychiatric nurse of Steps: 5 Home Layout: One level Home Equipment: Walker - 2 wheels;Cane - single point;Shower seat      Prior Function Level of  Independence: Independent with assistive  device(s);Needs assistance   Gait / Transfers Assistance Needed: no AD to walk around house. cane for community mobility. Daughter assists with tub transfers.            Hand Dominance   Dominant Hand: Right    Extremity/Trunk Assessment   Upper Extremity Assessment Upper Extremity Assessment: Defer to OT evaluation    Lower Extremity Assessment Lower Extremity Assessment: Overall WFL for tasks assessed    Cervical / Trunk Assessment Cervical / Trunk Assessment: Normal  Communication   Communication: No difficulties  Cognition Arousal/Alertness: Awake/alert Behavior During Therapy: WFL for tasks assessed/performed Overall Cognitive Status: Within Functional Limits for tasks assessed                                 General Comments: pt very pleasant and agreeable to mobility      General Comments      Exercises     Assessment/Plan    PT Assessment Patient needs continued PT services  PT Problem List Decreased strength;Decreased mobility;Decreased knowledge of use of DME;Pain;Decreased balance;Decreased knowledge of precautions       PT Treatment Interventions DME instruction;Therapeutic activities;Gait training;Therapeutic exercise;Patient/family education;Balance training;Stair training;Functional mobility training    PT Goals (Current goals can be found in the Care Plan section)  Acute Rehab PT Goals Patient Stated Goal: feel better, home PT Goal Formulation: With patient Time For Goal Achievement: 12/30/19 Potential to Achieve Goals: Good    Frequency Min 3X/week   Barriers to discharge        Co-evaluation               AM-PAC PT "6 Clicks" Mobility  Outcome Measure Help needed turning from your back to your side while in a flat bed without using bedrails?: A Little Help needed moving from lying on your back to sitting on the side of a flat bed without using bedrails?: A Little Help needed moving to and from a bed to a chair  (including a wheelchair)?: None Help needed standing up from a chair using your arms ([name].g., wheelchair or bedside chair)?: None Help needed to walk in hospital room?: None Help needed climbing 3-5 steps with a railing? : A Little 6 Click Score: 21    End of Session   Activity Tolerance: Patient tolerated treatment well Patient left: in chair;with call bell/phone within reach(pt verbally agrees to press call button and wait for assist prior to mobilizing) Nurse Communication: Mobility status PT Visit Diagnosis: Other abnormalities of gait and mobility (R26.89);Pain Pain - Right/Left: (mid) Pain - part of body: (abdomen)    Time: [id number] PT Time Calculation (min) (ACUTE ONLY): 12 min   Charges:   PT Evaluation $PT Eval Low Complexity: 1 Low        Jamie [name], PT Acute Rehabilitation Services Pager [phone]  Office [phone]   Jamie [name] Jamie [name] 12/16/2019, 10:14 AM

## 2019-12-16 NOTE — Discharge Instructions (Signed)
POST OP INSTRUCTIONS AFTER COLON SURGERY  1. DIET: Be sure to include lots of fluids daily to stay hydrated - 64oz of water per day (8, 8 oz glasses).  Avoid fast food or heavy meals for the first couple of weeks as your are more likely to get nauseated. Avoid raw/uncooked fruits or vegetables for the first 4 weeks (its ok to have these if they are blended into smoothie form). If you have fruits/vegetables, make sure they are cooked until soft enough to mash on the roof of your mouth and chew your food well. Otherwise, diet as tolerated.  2. Take your usually prescribed home medications unless otherwise directed.  3. PAIN CONTROL: a. Pain is best controlled by a usual combination of three different methods TOGETHER: i. Ice/Heat ii. Over the counter pain medication iii. Prescription pain medication b. Most patients will experience some swelling and bruising around the surgical site.  Ice packs or heating pads (30-60 minutes up to 6 times a day) will help. Some people prefer to use ice alone, heat alone, alternating between ice & heat.  Experiment to what works for you.  Swelling and bruising can take several weeks to resolve.   c. It is helpful to take an over-the-counter pain medication regularly for the first few weeks: i. Ibuprofen (Motrin/Advil) - 200mg tabs - take 3 tabs (600mg) every 6 hours as needed for pain (unless you have been directed previously to avoid NSAIDs/ibuprofen) ii. Acetaminophen (Tylenol) - you may take 650mg every 6 hours as needed. You can take this with motrin as they act differently on the body. If you are taking a narcotic pain medication that has acetaminophen in it, do not take over the counter tylenol at the same time. iii. NOTE: You may take both of these medications together - most patients  find it most helpful when alternating between the two (i.e. Ibuprofen at 6am, tylenol at 9am, ibuprofen at 12pm ...) d. A  prescription for pain medication should be given to you  upon discharge.  Take your pain medication as prescribed if your pain is not adequatly controlled with the over-the-counter pain reliefs mentioned above.  4. Avoid getting constipated.  Between the surgery and the pain medications, it is common to experience some constipation.  Increasing fluid intake and taking a fiber supplement (such as Metamucil, Citrucel, FiberCon, MiraLax, etc) 1-2 times a day regularly will usually help prevent this problem from occurring.  A mild laxative (prune juice, Milk of Magnesia, MiraLax, etc) should be taken according to package directions if there are no bowel movements after 48 hours.    5. Dressing: Your incisions are covered in Dermabond which is like sterile superglue for the skin. This will come off on it's own in a couple weeks. It is waterproof and you may bathe normally starting the day after your surgery in a shower. Avoid baths/pools/lakes/oceans until your wounds have fully healed.  6. ACTIVITIES as tolerated:   a. Avoid heavy lifting (>10lbs or 1 gallon of milk) for the next 6 weeks. b. You may resume regular daily activities as tolerated--such as daily self-care, walking, climbing stairs--gradually increasing activities as tolerated.  If you can walk 30 minutes without difficulty, it is safe to try more intense activity such as jogging, treadmill, bicycling, low-impact aerobics.  c. DO NOT PUSH THROUGH PAIN.  Let pain be your guide: If it hurts to do something, don't do it. d. You may drive when you are no longer taking prescription pain medication, you   can comfortably wear a seatbelt, and you can safely maneuver your car and apply brakes.  7. FOLLOW UP in our office a. Please call [facility] at [phone] to set up an appointment to see your surgeon in the office for a follow-up appointment approximately 2-3 weeks after your surgery. b. Make sure that you call for this appointment the day you arrive home to insure a convenient appointment time.  9. If you  have disability or family leave forms that need to be completed, you may have them completed by your primary care physician's office; for return to work instructions, please ask our office staff and they will be happy to assist you in obtaining this documentation   When to call us [phone]: 1. Poor pain control 2. Reactions / problems with new medications (rash/itching, etc)  3. Fever over 101.5 F (38.5 C) 4. Inability to urinate 5. Nausea/vomiting 6. Worsening swelling or bruising 7. Continued bleeding from incision. 8. Increased pain, redness, or drainage from the incision  The clinic staff is available to answer your questions during regular business hours (8:30am-5pm).  Please don't hesitate to call and ask to speak to one of our nurses for clinical concerns.   A surgeon from [facility] is always on call at the hospitals   If you have a medical emergency, go to the nearest emergency room or call 911.  [facility], [place] [address], [place], [place], [place]  [address] MAIN: [phone] FAX: [phone] [email or web address]

## 2019-12-17 LAB — BASIC METABOLIC PANEL
Anion gap: 6 (ref 5–15)
BUN: 9 mg/dL (ref 8–23)
CO2: 24 mmol/L (ref 22–32)
Calcium: 8.7 mg/dL — ABNORMAL LOW (ref 8.9–10.3)
Chloride: 111 mmol/L (ref 98–111)
Creatinine, Ser: 0.76 mg/dL (ref 0.44–1.00)
GFR calc Af Amer: >60 mL/min (ref >60)
GFR calc non Af Amer: >60 mL/min (ref >60)
Glucose, Bld: 81 mg/dL (ref 70–99)
Potassium: 3.8 mmol/L (ref 3.5–5.1)
Sodium: 141 mmol/L (ref 135–145)

## 2019-12-17 LAB — CBC
HCT: 24.1 % — ABNORMAL LOW (ref 36.0–46.0)
Hemoglobin: 7.1 g/dL — ABNORMAL LOW (ref 12.0–15.0)
MCH: 26.4 pg (ref 26.0–34.0)
MCHC: 29.5 g/dL — ABNORMAL LOW (ref 30.0–36.0)
MCV: 89.6 fL (ref 80.0–100.0)
Platelets: 307 10*3/uL (ref 150–400)
RBC: 2.69 MIL/uL — ABNORMAL LOW (ref 3.87–5.11)
RDW: 14.8 % (ref 11.5–15.5)
WBC: 13.5 10*3/uL — ABNORMAL HIGH (ref 4.0–10.5)
nRBC: 0 % (ref 0.0–0.2)

## 2019-12-17 NOTE — Progress Notes (Signed)
2 Days Post-Op   Subjective/Chief Complaint: No complaints. Has only tried liquids so far but is tolerating   Objective: Vital signs in last 24 hours: Temp:  [98.2 F (36.8 C)-99.1 F (37.3 C)] 98.5 F (36.9 C) (01/16 0601) Pulse Rate:  [80-98] 80 (01/16 0601) Resp:  [16-18] 18 (01/16 0601) BP: (116-122)/(48-63) 116/59 (01/16 0601) SpO2:  [95 %-100 %] 95 % (01/16 0759) Weight:  [68.9 kg] 68.9 kg (01/16 0500) Last BM Date: 12/17/19  Intake/Output from previous day: 01/15 0701 - 01/16 0700 In: 2734.9 [P.O.:1200; I.V.:1534.9] Out: 1900 [Urine:1900] Intake/Output this shift: No intake/output data recorded.  General appearance: alert and cooperative Resp: clear to auscultation bilaterally Cardio: regular rate and rhythm GI: soft, minimal tenderness. incision looks good  Lab Results:  Recent Labs    12/16/19 0406 12/17/19 0503  WBC 13.3* 13.5*  HGB 7.2* 7.1*  HCT 23.9* 24.1*  PLT 250 307   BMET Recent Labs    12/16/19 0406 12/17/19 0503  NA 139 141  K 4.0 3.8  CL 112* 111  CO2 22 24  GLUCOSE 138* 81  BUN 7* 9  CREATININE 0.69 0.76  CALCIUM 8.6* 8.7*   PT/INR No results for input(s): LABPROT, INR in the last 72 hours. ABG No results for input(s): PHART, HCO3 in the last 72 hours.  Invalid input(s): PCO2, PO2  Studies/Results: No results found.  Anti-infectives: Anti-infectives (From admission, onward)   Start     Dose/Rate Route Frequency Ordered Stop   12/15/19 1400  neomycin (MYCIFRADIN) tablet 1,000 mg  Status:  Discontinued     1,000 mg Oral 3 times per day 12/15/19 0850 12/15/19 1511   12/15/19 1400  metroNIDAZOLE (FLAGYL) tablet 1,000 mg  Status:  Discontinued     1,000 mg Oral 3 times per day 12/15/19 0850 12/15/19 1511   12/15/19 0900  cefoTEtan (CEFOTAN) 2 g in sodium chloride 0.9 % 100 mL IVPB     2 g 200 mL/hr over 30 Minutes Intravenous On call to O.R. 12/15/19 0850 12/15/19 2315      Assessment/Plan: s/p Procedure(s): LAPAROSCOPIC  RIGHT HEMI COLECTOMY WITH LYSIS OF ADHESIONS (Right) Advance diet. Allow soft foods Ambulate kvo ivf Patient Active Problem List   Diagnosis Date Noted  . Colon cancer ([place]) 12/15/2019  . Pelvic pain 11/17/2019  . Pulmonary thrombosis ([place]) 10/17/2019  . Pulmonary embolism ([place]) 1- 2 non-occlusive 10/10/2019  . Adenocarcinoma of cecum ([place]) 09/28/2019  . Normocytic anemia 09/28/2019  . Bloating 09/15/2019  . Mild persistent asthma 04/09/2018  . Degenerative arthritis of left knee 05/26/2017  . Degenerative tear of left medial meniscus 05/01/2017  . Anxiety 01/08/2017  . Arthritis of carpometacarpal (CMC) joints of both thumbs 06/22/2014  . Routine health maintenance 08/09/2011  . Alopecia 03/21/2009  . Hyperlipidemia 09/10/2007  . Essential hypertension 09/10/2007  . GERD (gastroesophageal reflux disease) 09/10/2007   s/p Procedure(s): LAPAROSCOPIC RIGHT HEMI COLECTOMY WITH LYSIS OF ADHESIONS 12/15/2019 - for adenocarcinoma of proximal ascending colon/cecum.  -Recovering well -Clear liquids, advance to fulls and then soft diet as tolerated. Decreased MIVF. If tolerating liquids without issue, can d/c IVF -PT/OT -Anemia of chronic disease - had bleeding colon cancer - now removed. Hgb 7.1 from 7.2; stable. Ordered Iron + vit C -Awaiting return of bowel function - continue Entereg at this time -Home meds reordered including Lasix -PPx: SCDs, SQH; she was on therapeutic PO anticoagulation but has been off this now for 3 weeks - this was ordered initially by medical oncology  for subsegmental PE and then discontinued. I spoke with Dr. Burr Medico - she would like her to restart Eliquis on discharge and continue for 6 more weeks following and then likely no further therapy planned.   LOS: 2 days    Autumn Messing III 12/17/2019

## 2019-12-18 LAB — CBC
HCT: 23.9 % — ABNORMAL LOW (ref 36.0–46.0)
Hemoglobin: 7 g/dL — ABNORMAL LOW (ref 12.0–15.0)
MCH: 26.2 pg (ref 26.0–34.0)
MCHC: 29.3 g/dL — ABNORMAL LOW (ref 30.0–36.0)
MCV: 89.5 fL (ref 80.0–100.0)
Platelets: 295 10*3/uL (ref 150–400)
RBC: 2.67 MIL/uL — ABNORMAL LOW (ref 3.87–5.11)
RDW: 15 % (ref 11.5–15.5)
WBC: 10 10*3/uL (ref 4.0–10.5)
nRBC: 0.2 % (ref 0.0–0.2)

## 2019-12-18 LAB — BASIC METABOLIC PANEL
Anion gap: 7 (ref 5–15)
BUN: 11 mg/dL (ref 8–23)
CO2: 23 mmol/L (ref 22–32)
Calcium: 8.1 mg/dL — ABNORMAL LOW (ref 8.9–10.3)
Chloride: 111 mmol/L (ref 98–111)
Creatinine, Ser: 0.74 mg/dL (ref 0.44–1.00)
GFR calc Af Amer: >60 mL/min (ref >60)
GFR calc non Af Amer: >60 mL/min (ref >60)
Glucose, Bld: 69 mg/dL — ABNORMAL LOW (ref 70–99)
Potassium: 3.4 mmol/L — ABNORMAL LOW (ref 3.5–5.1)
Sodium: 141 mmol/L (ref 135–145)

## 2019-12-18 MED ORDER — FERROUS SULFATE 325 (65 FE) MG PO TABS: 325.0000 mg | ORAL_TABLET | Freq: Two times a day (BID) | ORAL | 3 refills | Status: DC

## 2019-12-18 MED ORDER — APIXABAN 2.5 MG PO TABS: 2.5000 mg | ORAL_TABLET | Freq: Every morning | ORAL | 0 refills | Status: DC

## 2019-12-18 NOTE — Progress Notes (Signed)
3 Days Post-Op   Subjective/Chief Complaint: No complaints.  Having bowel movements and no blood in stool.  Hemoglobin is stable.  Asymptomatic when standing   Objective: Vital signs in last 24 hours: Temp:  [98.7 F (37.1 C)-99.1 F (37.3 C)] 98.7 F (37.1 C) (01/17 0659) Pulse Rate:  [86-91] 86 (01/17 0659) Resp:  [14-18] 18 (01/17 0659) BP: (114-132)/(55-77) 128/55 (01/17 0659) SpO2:  [95 %-99 %] 98 % (01/17 0747) Last BM Date: 12/17/19  Intake/Output from previous day: 01/16 0701 - 01/17 0700 In: 1051 [P.O.:540; I.V.:511] Out: 1200 [Urine:1200] Intake/Output this shift: No intake/output data recorded.  General appearance: alert and cooperative Resp: clear to auscultation bilaterally Cardio: regular rate and rhythm GI: Soft and nontender.  Incision looks good.  Lab Results:  Recent Labs    12/17/19 0503 12/18/19 0450  WBC 13.5* 10.0  HGB 7.1* 7.0*  HCT 24.1* 23.9*  PLT 307 295   BMET Recent Labs    12/17/19 0503 12/18/19 0450  NA 141 141  K 3.8 3.4*  CL 111 111  CO2 24 23  GLUCOSE 81 69*  BUN 9 11  CREATININE 0.76 0.74  CALCIUM 8.7* 8.1*   PT/INR No results for input(s): LABPROT, INR in the last 72 hours. ABG No results for input(s): PHART, HCO3 in the last 72 hours.  Invalid input(s): PCO2, PO2  Studies/Results: No results found.  Anti-infectives: Anti-infectives (From admission, onward)   Start     Dose/Rate Route Frequency Ordered Stop   12/15/19 1400  neomycin (MYCIFRADIN) tablet 1,000 mg  Status:  Discontinued     1,000 mg Oral 3 times per day 12/15/19 0850 12/15/19 1511   12/15/19 1400  metroNIDAZOLE (FLAGYL) tablet 1,000 mg  Status:  Discontinued     1,000 mg Oral 3 times per day 12/15/19 0850 12/15/19 1511   12/15/19 0900  cefoTEtan (CEFOTAN) 2 g in sodium chloride 0.9 % 100 mL IVPB     2 g 200 mL/hr over 30 Minutes Intravenous On call to O.R. 12/15/19 0850 12/15/19 2315      Assessment/Plan: s/p Procedure(s): LAPAROSCOPIC  RIGHT HEMI COLECTOMY WITH LYSIS OF ADHESIONS (Right) Advance diet Discharge  LOS: 3 days    Autumn Messing III 12/18/2019

## 2019-12-18 NOTE — Progress Notes (Signed)
4oz orange juice given this AM due to glucose being 69 on AM lab work.   Patient denies sweatines, shakiness, or hunger.   Will continue to monitor.    SWhittemore, Therapist, sports

## 2019-12-18 NOTE — Progress Notes (Signed)
RN reviewed discharge instructions with patient and family.   All questions answered.    NT rolled patient down in wheelchair with all belongings to family car.    SWhittemore, Therapist, sports

## 2019-12-18 NOTE — Progress Notes (Signed)
Pharmacy Brief Note - Alvimopan (Entereg)  The standing order set for alvimopan (Entereg) now includes an automatic order to discontinue the drug after the patient has had a bowel movement.  The change was approved by the [place] and the Medical Executive Committee.    This patient has had a bowel movement documented by nursing.  Therefore, alvimopan has been discontinued.  If there are questions, please contact the pharmacy at [phone].  Thank you- Netta [place], [facility] 12/18/2019 9:06 AM

## 2019-12-19 LAB — SURGICAL PATHOLOGY

## 2019-12-20 NOTE — Discharge Summary (Signed)
Patient ID: Jamie [name] MRN: [id number] DOB/AGE: November 22, 1933 86 y.o.  Admit date: 12/15/2019 Discharge date: 12/20/2019  Discharge Diagnoses Patient Active Problem List   Diagnosis Date Noted  . Colon cancer ([place]) 12/15/2019  . Pelvic pain 11/17/2019  . Pulmonary thrombosis ([place]) 10/17/2019  . Pulmonary embolism ([place]) 1- 2 non-occlusive 10/10/2019  . Adenocarcinoma of cecum ([place]) 09/28/2019  . Normocytic anemia 09/28/2019  . Bloating 09/15/2019  . Mild persistent asthma 04/09/2018  . Degenerative arthritis of left knee 05/26/2017  . Degenerative tear of left medial meniscus 05/01/2017  . Anxiety 01/08/2017  . Arthritis of carpometacarpal (CMC) joints of both thumbs 06/22/2014  . Routine health maintenance 08/09/2011  . Alopecia 03/21/2009  . Hyperlipidemia 09/10/2007  . Essential hypertension 09/10/2007  . GERD (gastroesophageal reflux disease) 09/10/2007    Consultants None  Procedures OR 12/15/2019 - PROCEDURE: 1. Laparoscopic right hemicolectomy 2. Laparoscopic lysis of adhesions x 90 minutes  Hospital Course: she was admitted the hospital postoperatively for she recovered well.  Her diet was gradually advanced.  Her pain is well controlled.  On 12/18/2019, she was tolerating a diet, having good bowel function, pain well controlled on oral analgesics, ambulating well and deemed stable for discharge home. Pathology returned moderately differentiated adenocarcinoma involving 0 of 19 lymph nodes. No LVI/PNI was noted.   pT3N0M0 (Stage II colon cancer)    Allergies as of 12/18/2019      Reactions   Aspirin Other (See Comments)   Stomach upset   Latex Itching, Rash      Medication List    STOP taking these medications   fluticasone 50 MCG/ACT nasal spray Commonly known as: FLONASE     TAKE these medications   acetaminophen 500 MG tablet Commonly known as: TYLENOL Take 500-1,000 mg by mouth every 6 (six) hours as needed (for pain.).   ALPRAZolam 0.5 MG  tablet Commonly known as: XANAX TAKE 1 TABLET BY MOUTH EVERY DAY AS NEEDED FOR ANXIETY What changed: See the new instructions.   amLODipine 5 MG tablet Commonly known as: NORVASC Take 1 tablet (5 mg total) by mouth daily.   apixaban 2.5 MG Tabs tablet Commonly known as: Eliquis Take 1 tablet (2.5 mg total) by mouth every morning. What changed: when to take this   atorvastatin 20 MG tablet Commonly known as: LIPITOR TAKE 1 TABLET (20 MG TOTAL) BY MOUTH DAILY AT 6 PM. NEED VISIT WITH LABS FOR FURTHER REFILLS   Breo Ellipta 100-25 MCG/INH Aepb Generic drug: fluticasone furoate-vilanterol INHALE 1 PUFF BY MOUTH EVERY DAY What changed: See the new instructions.   famotidine 20 MG tablet Commonly known as: Pepcid Take 1 tablet (20 mg total) by mouth 2 (two) times daily.   ferrous sulfate 325 (65 FE) MG tablet Take 1 tablet (325 mg total) by mouth 2 (two) times daily with a meal.   furosemide 40 MG tablet Commonly known as: LASIX TAKE 1 TABLET BY MOUTH EVERY DAY   Mylanta 200-200-20 MG/5ML suspension Generic drug: alum & mag hydroxide-simeth Take 15 mLs by mouth every 6 (six) hours as needed for indigestion or heartburn.   olmesartan 40 MG tablet Commonly known as: BENICAR TAKE 1 TABLET BY MOUTH EVERY DAY   ondansetron 4 MG disintegrating tablet Commonly known as: ZOFRAN-ODT Take 1 tablet (4 mg total) by mouth every 8 (eight) hours as needed for nausea or vomiting.   ProAir HFA 108 (90 Base) MCG/ACT inhaler Generic drug: albuterol INHALE 2 PUFFS INTO THE LUNGS EVERY 4 (FOUR) HOURS  AS NEEDED FOR WHEEZING OR SHORTNESS OF BREATH. What changed: See the new instructions.   sennosides-docusate sodium 8.6-50 MG tablet Commonly known as: SENOKOT-S Take 1 tablet by mouth daily as needed for constipation.   traMADol 50 MG tablet Commonly known as: Ultram Take 1 tablet (50 mg total) by mouth every 6 (six) hours as needed for up to 7 days (severe postop pain not controlled with  tylenol and ibuprofen). What changed:   when to take this  reasons to take this Notes to patient: Pain medication; use after tylenol; take as needed   triamcinolone cream 0.1 % Commonly known as: KENALOG APPLY TO AFFECTED AREA TWICE A DAY What changed: See the new instructions. Notes to patient: Home regimen        Follow-up Information    Ileana Roup, MD Follow up in 2 week(s).   Specialty: General Surgery Contact information: [place] [address] [phone]           Bridey Brookover M. Dema Severin, M.D. [facility]

## 2019-12-29 ENCOUNTER — Telehealth: Payer: Self-pay

## 2019-12-29 NOTE — Telephone Encounter (Signed)
New message    Cancer surgery two weeks ago today.   When would it be safe to get the COVID vaccine?

## 2019-12-30 NOTE — Telephone Encounter (Signed)
Notified pt w/MD response.../lmb 

## 2019-12-30 NOTE — Telephone Encounter (Signed)
That will not affect getting vaccine.

## 2020-01-09 NOTE — Progress Notes (Signed)
[place]   Telephone:([phone]) [phone] Fax:([phone]) [phone]   Clinic Follow up Note   Patient Care Team: Hoyt Koch, MD as PCP - General (Internal Medicine) Karl Luke, MD as Referring Physician (Optometry) Dema Severin, Sharon Mt, MD as Consulting Physician (General Surgery) Truitt Merle, MD as Consulting Physician (Hematology) Gatha Mayer, MD as Consulting Physician (Gastroenterology)  Date of Service:  01/16/2020  CHIEF COMPLAINT: F/u of right colon cancer   SUMMARY OF ONCOLOGIC HISTORY: Oncology History Overview Note  Cancer Staging Adenocarcinoma of cecum [facility]) Staging form: Colon and Rectum, AJCC 8th Edition - Clinical: Stage Unknown (cTX, cN0, cM0) - Signed by Truitt Merle, MD on 10/17/2019    Adenocarcinoma of cecum ([place])  09/26/2019 Imaging   CT AP W Contrast  IMPRESSION: Large cecal mass is noted concerning for malignancy. Colonoscopy is recommended for further evaluation. These results will be called to the ordering clinician or representative by the Radiologist Assistant, and communication documented in the PACS or zVision Dashboard.   Aortic Atherosclerosis (ICD10-I70.0).   09/28/2019 Initial Diagnosis   Adenocarcinoma of cecum ([place])   10/04/2019 Pathology Results   Cecal mass biopsy showed adenocarcinoma   10/04/2019 Procedure   -Malignant tumor in the cecum. Biopsied. - Diverticulosis in the sigmoid colon. - The examination was otherwise normal   10/04/2019 Miscellaneous   MMR showed loss of MLH1 and PMS2 expression    10/10/2019 Imaging   CT chest W contrast  IMPRESSION: 1. Small volume nonocclusive pulmonary embolus identified in segmental left upper lobe pulmonary artery and subsegmental pulmonary artery to the left lower lobe. Left lower lobe embolus has a string like configuration suggesting that these findings may well be subacute to chronic. Probable trace nonobstructive embolus in a subsegmental artery to the right  lower lobe. 2. No evidence for metastatic disease in the thorax. 3.  Aortic Atherosclerois (ICD10-170.0)   10/17/2019 Cancer Staging   Staging form: Colon and Rectum, AJCC 8th Edition - Clinical: Stage Unknown (cTX, cN0, cM0) - Signed by Truitt Merle, MD on 10/17/2019   12/15/2019 Surgery   LAPAROSCOPIC RIGHT HEMI COLECTOMY WITH LYSIS OF ADHESIONS by Dr. Dema Severin     12/15/2019 Pathology Results   FINAL MICROSCOPIC DIAGNOSIS:   A. COLON, RIGHT, RESECTION:  -  Adenocarcinoma, moderately differentiated, 7 cm  -  Carcinoma invades through muscularis propria into pericolorectal  tissue  -  No carcinoma identified in nineteen (0/19)  -  Margins uninvolved by carcinoma  -  Appendix uninvolved by carcinoma  -  Tubular adenoma, incidental (0.3 cm)  -  See oncology table and comment below    12/15/2019 Cancer Staging   Staging form: Colon and Rectum, AJCC 8th Edition - Pathologic stage from 12/15/2019: Stage IIA (pT3, pN0, cM0) - Signed by Truitt Merle, MD on 01/14/2020      CURRENT THERAPY:  Surveillance   INTERVAL HISTORY:  Jamie [name] is here for a follow up post surgery. She presents to the clinic with her family member. She notes her surgery went well. She notes she had not fully recovered but her appetite is returning slowly. She notes her legs are shaky and weak when she stands up. Her energy has not fully recovered. She notes she is able to eat better. She has been seen by Dr. Dema Severin on 01/04/20 and her incision is healing well.     REVIEW OF SYSTEMS:   Constitutional: Denies fevers, chills or abnormal weight loss Eyes: Denies blurriness of vision Ears, nose, mouth, throat,  and face: Denies mucositis or sore throat Respiratory: Denies cough, dyspnea or wheezes Cardiovascular: Denies palpitation, chest discomfort or lower extremity swelling Gastrointestinal:  Denies nausea, heartburn or change in bowel habits Skin: Denies abnormal skin rashes Lymphatics: Denies new  lymphadenopathy or easy bruising Neurological:Denies numbness, tingling or new weaknesses Behavioral/Psych: Mood is stable, no new changes  All other systems were reviewed with the patient and are negative.  MEDICAL HISTORY:  Past Medical History:  Diagnosis Date  . Adenocarcinoma of cecum ([facility]) 09/28/2019  . ANEMIA, IRON DEFICIENCY 09/10/2007  . Anxiety   . Arthritis   . Asthma   . Blood transfusion without reported diagnosis   . Cataract 2008   bilateral cataract extraction  . CATARACT EXTRACTION, HX OF 09/10/2007  . DYSPEPSIA, HX OF 09/10/2007  . GERD (gastroesophageal reflux disease)   . HAIR LOSS 03/21/2009  . HAY FEVER 09/10/2007  . HEARING LOSS, SENSORINEURAL 09/10/2007  . HYPERLIPIDEMIA 09/10/2007  . HYPERTENSION 09/10/2007  . LOW BACK PAIN 05/10/2008  . Pain in joint, hand 03/01/2008  . PEPTIC ULCER DISEASE 09/10/2007  . POLYP, COLON 09/10/2007  . POLYPECTOMY, HX OF 09/10/2007  . Pulmonary embolism ([facility]CC) 1- 2 non-occlusive 10/10/2019  . SHINGLES 08/13/2009  . SHOULDER PAIN, LEFT 02/12/2008  . TAH/BSO, HX OF 09/10/2007  . Tuberculosis    PATIENT DENIES AT PHONE INTERVIEW ON 12/12/2019    SURGICAL HISTORY: Past Surgical History:  Procedure Laterality Date  . ABDOMINAL HYSTERECTOMY    . BREAST SURGERY     breast biopsy-benign  . CATARACT EXTRACTION    . COLONOSCOPY    . EYE SURGERY     bilateral cataract with lens implant  . IRRIGATION AND DEBRIDEMENT SEBACEOUS CYST    . LAPAROSCOPIC RIGHT HEMI COLECTOMY Right 12/15/2019   Procedure: LAPAROSCOPIC RIGHT HEMI COLECTOMY WITH LYSIS OF ADHESIONS;  Surgeon: White, Christopher M, MD;  Location: [facility];  Service: General;  Laterality: Right;  . TOTAL ABDOMINAL HYSTERECTOMY W/ BILATERAL SALPINGOOPHORECTOMY      I have reviewed the social history and family history with the patient and they are unchanged from previous note.  ALLERGIES:  is allergic to aspirin and latex.  MEDICATIONS:  Current Outpatient Medications    Medication Sig Dispense Refill  . acetaminophen (TYLENOL) 500 MG tablet Take 500-1,000 mg by mouth every 6 (six) hours as needed (for pain.).     . ALPRAZolam (XANAX) 0.5 MG tablet TAKE 1 TABLET BY MOUTH EVERY DAY AS NEEDED FOR ANXIETY (Patient taking differently: Take 0.5 mg by mouth daily as needed for anxiety. ) 20 tablet 2  . alum & mag hydroxide-simeth (MYLANTA) 200-200-20 MG/5ML suspension Take 15 mLs by mouth every 6 (six) hours as needed for indigestion or heartburn.    . amLODipine (NORVASC) 5 MG tablet Take 1 tablet (5 mg total) by mouth daily. 90 tablet 1  . apixaban (ELIQUIS) 2.5 MG TABS tablet Take 1 tablet (2.5 mg total) by mouth every morning. 42 tablet 0  . atorvastatin (LIPITOR) 20 MG tablet TAKE 1 TABLET (20 MG TOTAL) BY MOUTH DAILY AT 6 PM. NEED VISIT WITH LABS FOR FURTHER REFILLS 90 tablet 1  . BREO ELLIPTA 100-25 MCG/INH AEPB INHALE 1 PUFF BY MOUTH EVERY DAY (Patient taking differently: Inhale 1 puff into the lungs daily. ) 60 each 3  . famotidine (PEPCID) 20 MG tablet Take 1 tablet (20 mg total) by mouth 2 (two) times daily. 180 tablet 3  . ferrous sulfate 325 (65 FE) MG tablet Take 1 tablet (  325 mg total) by mouth 2 (two) times daily with a meal. 60 tablet 3  . furosemide (LASIX) 40 MG tablet TAKE 1 TABLET BY MOUTH EVERY DAY (Patient taking differently: Take 40 mg by mouth daily. ) 90 tablet 1  . olmesartan (BENICAR) 40 MG tablet TAKE 1 TABLET BY MOUTH EVERY DAY (Patient taking differently: Take 40 mg by mouth daily. ) 90 tablet 3  . ondansetron (ZOFRAN-ODT) 4 MG disintegrating tablet Take 1 tablet (4 mg total) by mouth every 8 (eight) hours as needed for nausea or vomiting. 60 tablet 0  . PROAIR HFA 108 (90 Base) MCG/ACT inhaler INHALE 2 PUFFS INTO THE LUNGS EVERY 4 (FOUR) HOURS AS NEEDED FOR WHEEZING OR SHORTNESS OF BREATH. (Patient taking differently: Inhale 2 puffs into the lungs every 4 (four) hours as needed for wheezing or shortness of breath. ) 25.5 g 3  .  sennosides-docusate sodium (SENOKOT-S) 8.6-50 MG tablet Take 1 tablet by mouth daily as needed for constipation.    . triamcinolone cream (KENALOG) 0.1 % APPLY TO AFFECTED AREA TWICE A DAY (Patient taking differently: Apply 1 application topically 2 (two) times daily as needed (skin irritation/rash). ) 105 g 3   No current facility-administered medications for this visit.    PHYSICAL EXAMINATION: ECOG PERFORMANCE STATUS: 2 - Symptomatic, <50% confined to bed  Vitals:   01/16/20 0848 01/16/20 0849  BP: (!) 164/57 (!) 154/60  Pulse: 86   Resp: 18   Temp: 98.7 F (37.1 C)   SpO2: 100%    Filed Weights   01/16/20 0848  Weight: 156 lb 9.6 oz (71 kg)    GENERAL:alert, no distress and comfortable SKIN: skin color, texture, turgor are normal, no rashes or significant lesions EYES: normal, Conjunctiva are pink and non-injected, sclera clear  NECK: supple, thyroid normal size, non-tender, without nodularity LYMPH:  no palpable lymphadenopathy in the cervical, axillary  LUNGS: clear to auscultation and percussion with normal breathing effort HEART: regular rate & rhythm and no murmurs and no lower extremity edema ABDOMEN:abdomen soft, non-tender and normal bowel sounds (+) abdominal Surgical incision healing well  Musculoskeletal:no cyanosis of digits and no clubbing  NEURO: alert & oriented x 3 with fluent speech, no focal motor/sensory deficits  LABORATORY DATA:  I have reviewed the data as listed CBC Latest Ref Rng & Units 01/16/2020 12/18/2019 12/17/2019  WBC 4.0 - 10.5 K/uL 5.0 10.0 13.5(H)  Hemoglobin 12.0 - 15.0 g/dL 11.5(L) 7.0(L) 7.1(L)  Hematocrit 36.0 - 46.0 % 36.5 23.9(L) 24.1(L)  Platelets 150 - 400 K/uL 218 295 307     CMP Latest Ref Rng & Units 01/16/2020 12/18/2019 12/17/2019  Glucose 70 - 99 mg/dL 103(H) 69(L) 81  BUN 8 - 23 mg/dL _0 Creatinine 0.44 - 1.00 mg/dL 0.82 0.74 0.76  Sodium 135 - 145 mmol/L 145 141 141  Potassium 3.5 - 5.1 mmol/L 3.8 3.4(L) 3.8    Chloride 98 - 111 mmol/L 110 111 111  CO2 22 - 32 mmol/L _1 Calcium 8.9 - 10.3 mg/dL 9.3 8.1(L) 8.7(L)  Total Protein 6.5 - 8.1 g/dL 7.7 - -  Total Bilirubin 0.3 - 1.2 mg/dL 0.4 - -  Alkaline Phos 38 - 126 U/L 140(H) - -  AST 15 - 41 U/L 15 - -  ALT 0 - 44 U/L 18 - -      RADIOGRAPHIC STUDIES: I have personally reviewed the radiological images as listed and agreed with the findings in the report. No results  found.   ASSESSMENT & PLAN:  Jamie [name] is a 86 y.o. female with    1. Right colon cancer, pT3N0M0, stage IIA, MSI-H -She was recently diagnosed in 10/2019. She has biopsy-proven cecal adenocarcinoma, staging CT scan was negative for distant metastasis. -She underwent curative right hemicolectomy with Dr. White on 12/15/19. We discussed her path which showed complete surgical resection of 7cm tumor and negative 19 LNs were removed.  -I reviewed with patient her MMR result which shows her cancer has MLH1 and PMS2 loss, and MLH hypermethylation positive, support sporadic cancer, not Lynch syndrome.   -Given her early stage cancer with complete surgical resection, I do not recommend adjuvant chemotherapy.   -I discussed the risk of cancer recurrence in the future, which is about 20%. I discussed the surveillance plan, which is a physical exam and lab test (including CBC, CMP and CEA) every 3 months for the first 2 years, then every 6-12 months, colonoscopy in one year, and surveillance CT scan every 6-12 month for up to 5 year.  -I encouraged her to watch for concerning symptoms of cancer recurrence.  -S/p surgery is has been recovering slowly. Her surgical incision is healing well. She her eating has returned but weight gain, energy and strength is recovering as well. Her BMs are slightly constipation, she will continue Senakot. Labs reviewed, she has mild anemia from surgery.  -I encouraged her to continue to focus on her eating, activity level and regaining strength.    -F/u in 3 months and f/u with Dr. White in 6 months.    2. LUL pulmonary embolism  -Seen on 10/2019 CT chest, she was asymptomatic. -Lower extremely Doppler was negative for DVT -I think her small volume PE is related to her colon cancer. She is currently on Eliquis 2.5mg since 10/2019. Plan to stop after next visit   3. Iron deficient anemia -secondary to #1 -I encourage her to take oral iron.  -Mild from recent surgery  4. HTN, GERD -f/u with PCP   PLAN:  -Lab and F/u in 3 months, plan to stop Eliquis on next visit    No problem-specific Assessment & Plan notes found for this encounter.   No orders of the defined types were placed in this encounter.  All questions were answered. The patient knows to call the clinic with any problems, questions or concerns. No barriers to learning was detected. The total time spent in the appointment was 30 minutes.     Yan Feng, MD 01/16/2020   I, Amoya Bennett, am acting as scribe for Yan Feng, MD.   I have reviewed the above documentation for accuracy and completeness, and I agree with the above.       

## 2020-01-13 ENCOUNTER — Other Ambulatory Visit: Payer: Self-pay

## 2020-01-13 DIAGNOSIS — C180 Malignant neoplasm of cecum: Secondary | ICD-10-CM

## 2020-01-16 ENCOUNTER — Other Ambulatory Visit: Payer: Self-pay

## 2020-01-16 ENCOUNTER — Inpatient Hospital Stay: Payer: Medicare PPO

## 2020-01-16 ENCOUNTER — Inpatient Hospital Stay: Payer: Medicare PPO | Attending: Hematology | Admitting: Hematology

## 2020-01-16 ENCOUNTER — Encounter: Payer: Self-pay | Admitting: Hematology

## 2020-01-16 VITALS — BP 154/60 | HR 86 | Temp 98.7°F | Resp 18 | Ht 62.0 in | Wt 156.6 lb

## 2020-01-16 DIAGNOSIS — C180 Malignant neoplasm of cecum: Secondary | ICD-10-CM | POA: Insufficient documentation

## 2020-01-16 DIAGNOSIS — Z7901 Long term (current) use of anticoagulants: Secondary | ICD-10-CM | POA: Diagnosis not present

## 2020-01-16 DIAGNOSIS — Z90710 Acquired absence of both cervix and uterus: Secondary | ICD-10-CM | POA: Diagnosis not present

## 2020-01-16 DIAGNOSIS — J45909 Unspecified asthma, uncomplicated: Secondary | ICD-10-CM | POA: Insufficient documentation

## 2020-01-16 DIAGNOSIS — R531 Weakness: Secondary | ICD-10-CM | POA: Diagnosis not present

## 2020-01-16 DIAGNOSIS — I10 Essential (primary) hypertension: Secondary | ICD-10-CM | POA: Diagnosis not present

## 2020-01-16 DIAGNOSIS — Z86711 Personal history of pulmonary embolism: Secondary | ICD-10-CM | POA: Diagnosis not present

## 2020-01-16 DIAGNOSIS — Z7951 Long term (current) use of inhaled steroids: Secondary | ICD-10-CM | POA: Diagnosis not present

## 2020-01-16 DIAGNOSIS — D509 Iron deficiency anemia, unspecified: Secondary | ICD-10-CM | POA: Diagnosis not present

## 2020-01-16 DIAGNOSIS — Z90722 Acquired absence of ovaries, bilateral: Secondary | ICD-10-CM | POA: Diagnosis not present

## 2020-01-16 DIAGNOSIS — F419 Anxiety disorder, unspecified: Secondary | ICD-10-CM | POA: Diagnosis not present

## 2020-01-16 DIAGNOSIS — E785 Hyperlipidemia, unspecified: Secondary | ICD-10-CM | POA: Insufficient documentation

## 2020-01-16 DIAGNOSIS — Z79899 Other long term (current) drug therapy: Secondary | ICD-10-CM | POA: Insufficient documentation

## 2020-01-16 DIAGNOSIS — Z9079 Acquired absence of other genital organ(s): Secondary | ICD-10-CM | POA: Diagnosis not present

## 2020-01-16 LAB — CBC WITH DIFFERENTIAL (CANCER CENTER ONLY)
Abs Immature Granulocytes: 0.01 10*3/uL (ref 0.00–0.07)
Basophils Absolute: 0 10*3/uL (ref 0.0–0.1)
Basophils Relative: 1 %
Eosinophils Absolute: 0.3 10*3/uL (ref 0.0–0.5)
Eosinophils Relative: 6 %
HCT: 36.5 % (ref 36.0–46.0)
Hemoglobin: 11.5 g/dL — ABNORMAL LOW (ref 12.0–15.0)
Immature Granulocytes: 0 %
Lymphocytes Relative: 31 %
Lymphs Abs: 1.5 10*3/uL (ref 0.7–4.0)
MCH: 29.7 pg (ref 26.0–34.0)
MCHC: 31.5 g/dL (ref 30.0–36.0)
MCV: 94.3 fL (ref 80.0–100.0)
Monocytes Absolute: 0.4 10*3/uL (ref 0.1–1.0)
Monocytes Relative: 7 %
Neutro Abs: 2.8 10*3/uL (ref 1.7–7.7)
Neutrophils Relative %: 55 %
Platelet Count: 218 10*3/uL (ref 150–400)
RBC: 3.87 MIL/uL (ref 3.87–5.11)
RDW: 21.3 % — ABNORMAL HIGH (ref 11.5–15.5)
WBC Count: 5 10*3/uL (ref 4.0–10.5)
nRBC: 0 % (ref 0.0–0.2)

## 2020-01-16 LAB — CMP (CANCER CENTER ONLY)
ALT: 18 U/L (ref 0–44)
AST: 15 U/L (ref 15–41)
Albumin: 3.9 g/dL (ref 3.5–5.0)
Alkaline Phosphatase: 140 U/L — ABNORMAL HIGH (ref 38–126)
Anion gap: 11 (ref 5–15)
BUN: 11 mg/dL (ref 8–23)
CO2: 24 mmol/L (ref 22–32)
Calcium: 9.3 mg/dL (ref 8.9–10.3)
Chloride: 110 mmol/L (ref 98–111)
Creatinine: 0.82 mg/dL (ref 0.44–1.00)
GFR, Est AFR Am: >60 mL/min (ref >60)
GFR, Estimated: >60 mL/min (ref >60)
Glucose, Bld: 103 mg/dL — ABNORMAL HIGH (ref 70–99)
Potassium: 3.8 mmol/L (ref 3.5–5.1)
Sodium: 145 mmol/L (ref 135–145)
Total Bilirubin: 0.4 mg/dL (ref 0.3–1.2)
Total Protein: 7.7 g/dL (ref 6.5–8.1)

## 2020-01-17 ENCOUNTER — Telehealth: Payer: Self-pay | Admitting: Hematology

## 2020-01-17 NOTE — Telephone Encounter (Signed)
Scheduled appt per 2/15 los. ° °Sent a message to HIM pool to get a calendar mailed out. ° °

## 2020-01-27 ENCOUNTER — Ambulatory Visit: Payer: Medicare PPO | Attending: Internal Medicine

## 2020-01-27 DIAGNOSIS — Z23 Encounter for immunization: Secondary | ICD-10-CM | POA: Insufficient documentation

## 2020-01-27 NOTE — Progress Notes (Signed)
   Covid-19 Vaccination Clinic  Name:  KYE HUNTSBERGER    MRN: [id number] DOB: 1933-03-15  01/27/2020  Ms. Adamski was observed post Covid-19 immunization for 15 minutes without incidence. She was provided with Vaccine Information Sheet and instruction to access the V-Safe system.   Ms. Hable was instructed to call 911 with any severe reactions post vaccine: Marland Kitchen Difficulty breathing  . Swelling of your face and throat  . A fast heartbeat  . A bad rash all over your body  . Dizziness and weakness    Immunizations Administered    Name Date Dose VIS Date Route   Pfizer COVID-19 Vaccine 01/27/2020  2:20 PM 0.3 mL 11/11/2019 Intramuscular   Manufacturer: [place]   Lot: [id number]   [place]: [id number]

## 2020-02-22 ENCOUNTER — Ambulatory Visit: Payer: Medicare PPO | Attending: Internal Medicine

## 2020-02-22 DIAGNOSIS — Z23 Encounter for immunization: Secondary | ICD-10-CM

## 2020-02-22 NOTE — Progress Notes (Signed)
   Covid-19 Vaccination Clinic  Name:  Jamie [name]    MRN: [id number] DOB: [birth year]  02/22/2020  Jamie [name] was observed post Covid-19 immunization for 15 minutes without incident. She was provided with Vaccine Information Sheet and instruction to access the V-Safe system.   Jamie [name] was instructed to call 911 with any severe reactions post vaccine: Marland Kitchen Difficulty breathing  . Swelling of face and throat  . A fast heartbeat  . A bad rash all over body  . Dizziness and weakness   Immunizations Administered    Name Date Dose VIS Date Route   Pfizer COVID-19 Vaccine 02/22/2020 11:11 AM 0.3 mL 11/11/2019 Intramuscular   Manufacturer: [place]   Lot: [id number]   [place]: [id number]

## 2020-03-16 ENCOUNTER — Other Ambulatory Visit: Payer: Self-pay

## 2020-03-16 ENCOUNTER — Encounter: Payer: Self-pay | Admitting: Internal Medicine

## 2020-03-16 ENCOUNTER — Ambulatory Visit: Payer: Medicare PPO | Admitting: Internal Medicine

## 2020-03-16 VITALS — BP 152/70 | HR 93 | Temp 98.2°F | Ht 62.0 in | Wt 146.0 lb

## 2020-03-16 DIAGNOSIS — I10 Essential (primary) hypertension: Secondary | ICD-10-CM | POA: Diagnosis not present

## 2020-03-16 DIAGNOSIS — C180 Malignant neoplasm of cecum: Secondary | ICD-10-CM

## 2020-03-16 DIAGNOSIS — G8929 Other chronic pain: Secondary | ICD-10-CM

## 2020-03-16 DIAGNOSIS — M545 Low back pain: Secondary | ICD-10-CM

## 2020-03-16 NOTE — Assessment & Plan Note (Signed)
BP mildly elevated today and will monitor closely. Other recent visits with BP at goal. Taking amlodipine, lasix, olmesartan.

## 2020-03-16 NOTE — Progress Notes (Signed)
   Subjective:   Patient ID: Jamie [name], female    DOB: 1933-07-21, 86 y.o.   MRN: [id number]  HPI The patient is an 86 YO female coming in for colon cancer (s/p surgery and healed well, denies diarrhea or constipation, no blood in stool, still taking iron pills daily, not planning chemotherapy at this time, monitoring CEA levels with oncology) and low back pain (worse with standing or sitting prolonged time, denies numbness or weakness in legs, denies radiation of pain, rare tylenol use for it, mostly just rubs her back and moves around and it goes away, started months to years ago) and blood pressure (BP slightly elevated today, denies missing medications, taking lasix and amlodipine and olmesartan, denies side effects, denies headaches or chest pains).   Review of Systems  Constitutional: Negative.   HENT: Negative.   Eyes: Negative.   Respiratory: Negative for cough, chest tightness and shortness of breath.   Cardiovascular: Negative for chest pain, palpitations and leg swelling.  Gastrointestinal: Negative for abdominal distention, abdominal pain, constipation, diarrhea, nausea and vomiting.  Musculoskeletal: Positive for back pain.  Skin: Negative.   Neurological: Negative.   Psychiatric/Behavioral: Negative.     Objective:  Physical Exam Constitutional:      Appearance: She is well-developed.  HENT:     Head: Normocephalic and atraumatic.  Cardiovascular:     Rate and Rhythm: Normal rate and regular rhythm.  Pulmonary:     Effort: Pulmonary effort is normal. No respiratory distress.     Breath sounds: Normal breath sounds. No wheezing or rales.  Abdominal:     General: Bowel sounds are normal. There is no distension.     Palpations: Abdomen is soft.     Tenderness: There is no abdominal tenderness. There is no rebound.  Musculoskeletal:     Cervical back: Normal range of motion.  Skin:    General: Skin is warm and dry.  Neurological:     Mental Status: She is  alert and oriented to person, place, and time.     Coordination: Coordination normal.     Vitals:   03/16/20 1021 03/16/20 1101  BP: (!) 150/72 (!) 152/70  Pulse: 93   Temp: 98.2 F (36.8 C)   TempSrc: Oral   SpO2: 98%   Weight: 146 lb (66.2 kg)   Height: 5\' 2"  (1.575 m)     This visit occurred during the SARS-CoV-2 public health emergency.  Safety protocols were in place, including screening questions prior to the visit, additional usage of staff PPE, and extensive cleaning of exam room while observing appropriate contact time as indicated for disinfecting solutions.   Assessment & Plan:

## 2020-03-16 NOTE — Assessment & Plan Note (Signed)
Likely related to arthritis. No red flag signs to suggest need for imaging today. Continue tylenol prn and regular activity.

## 2020-03-16 NOTE — Assessment & Plan Note (Signed)
No chemotherapy planned at this time and recovered from surgery well. Seeing oncology and they will monitor her CEA levels.

## 2020-03-16 NOTE — Patient Instructions (Signed)
The mammogram you can do beginning of May.

## 2020-03-22 ENCOUNTER — Other Ambulatory Visit: Payer: Self-pay | Admitting: Internal Medicine

## 2020-03-22 DIAGNOSIS — E782 Mixed hyperlipidemia: Secondary | ICD-10-CM

## 2020-03-29 ENCOUNTER — Other Ambulatory Visit: Payer: Self-pay | Admitting: Internal Medicine

## 2020-03-29 DIAGNOSIS — F419 Anxiety disorder, unspecified: Secondary | ICD-10-CM

## 2020-03-30 NOTE — Telephone Encounter (Signed)
Check [place] registry last filled 02/09/2020.Marland KitchenJohny [name]

## 2020-03-31 DIAGNOSIS — Z91040 Latex allergy status: Secondary | ICD-10-CM | POA: Diagnosis not present

## 2020-03-31 DIAGNOSIS — F419 Anxiety disorder, unspecified: Secondary | ICD-10-CM | POA: Diagnosis not present

## 2020-03-31 DIAGNOSIS — K219 Gastro-esophageal reflux disease without esophagitis: Secondary | ICD-10-CM | POA: Diagnosis not present

## 2020-03-31 DIAGNOSIS — E785 Hyperlipidemia, unspecified: Secondary | ICD-10-CM | POA: Diagnosis not present

## 2020-03-31 DIAGNOSIS — Z7951 Long term (current) use of inhaled steroids: Secondary | ICD-10-CM | POA: Diagnosis not present

## 2020-03-31 DIAGNOSIS — J45909 Unspecified asthma, uncomplicated: Secondary | ICD-10-CM | POA: Diagnosis not present

## 2020-03-31 DIAGNOSIS — Z6829 Body mass index (BMI) 29.0-29.9, adult: Secondary | ICD-10-CM | POA: Diagnosis not present

## 2020-03-31 DIAGNOSIS — Z87891 Personal history of nicotine dependence: Secondary | ICD-10-CM | POA: Diagnosis not present

## 2020-03-31 DIAGNOSIS — E663 Overweight: Secondary | ICD-10-CM | POA: Diagnosis not present

## 2020-03-31 DIAGNOSIS — M1990 Unspecified osteoarthritis, unspecified site: Secondary | ICD-10-CM | POA: Diagnosis not present

## 2020-03-31 DIAGNOSIS — I10 Essential (primary) hypertension: Secondary | ICD-10-CM | POA: Diagnosis not present

## 2020-04-03 ENCOUNTER — Other Ambulatory Visit: Payer: Self-pay | Admitting: Internal Medicine

## 2020-04-05 ENCOUNTER — Other Ambulatory Visit: Payer: Self-pay | Admitting: Internal Medicine

## 2020-04-12 NOTE — Progress Notes (Signed)
[facility]   Telephone:([phone]) [phone] [phone]) [phone]   Clinic Follow up Note   Patient Care Team: Hoyt Koch, MD as PCP - General (Internal Medicine) Karl Luke, MD as Referring Physician (Optometry) Dema Severin, Sharon Mt, MD as Consulting Physician (General Surgery) Truitt Merle, MD as Consulting Physician (Hematology) Gatha Mayer, MD as Consulting Physician (Gastroenterology)  Date of Service:  04/13/2020  CHIEF COMPLAINT:  F/u of right colon cancer   SUMMARY OF ONCOLOGIC HISTORY: Oncology History Overview Note  Cancer Staging Adenocarcinoma of cecum [facility]) Staging form: Colon and Rectum, AJCC 8th Edition - Clinical: Stage Unknown (cTX, cN0, cM0) - Signed by Truitt Merle, MD on 10/17/2019 - Pathologic stage from 12/15/2019: Stage IIA (pT3, pN0, cM0) - Signed by Truitt Merle, MD on 01/14/2020    Adenocarcinoma of cecum ([place])  09/26/2019 Imaging   CT AP W Contrast  IMPRESSION: Large cecal mass is noted concerning for malignancy. Colonoscopy is recommended for further evaluation. These results will be called to the ordering clinician or representative by the Radiologist Assistant, and communication documented in the PACS or zVision Dashboard.   Aortic Atherosclerosis (ICD10-I70.0).   09/28/2019 Initial Diagnosis   Adenocarcinoma of cecum ([place])   10/04/2019 Pathology Results   Cecal mass biopsy showed adenocarcinoma   10/04/2019 Procedure   -Malignant tumor in the cecum. Biopsied. - Diverticulosis in the sigmoid colon. - The examination was otherwise normal   10/04/2019 Miscellaneous   MMR showed loss of MLH1 and PMS2 expression    10/10/2019 Imaging   CT chest W contrast  IMPRESSION: 1. Small volume nonocclusive pulmonary embolus identified in segmental left upper lobe pulmonary artery and subsegmental pulmonary artery to the left lower lobe. Left lower lobe embolus has a string like configuration suggesting that these findings may well be  subacute to chronic. Probable trace nonobstructive embolus in a subsegmental artery to the right lower lobe. 2. No evidence for metastatic disease in the thorax. 3.  Aortic Atherosclerois (ICD10-170.0)   10/17/2019 Cancer Staging   Staging form: Colon and Rectum, AJCC 8th Edition - Clinical: Stage Unknown (cTX, cN0, cM0) - Signed by Truitt Merle, MD on 10/17/2019   12/15/2019 Surgery   LAPAROSCOPIC RIGHT HEMI COLECTOMY WITH LYSIS OF ADHESIONS by Dr. Dema Severin     12/15/2019 Pathology Results   FINAL MICROSCOPIC DIAGNOSIS:   A. COLON, RIGHT, RESECTION:  -  Adenocarcinoma, moderately differentiated, 7 cm  -  Carcinoma invades through muscularis propria into pericolorectal  tissue  -  No carcinoma identified in nineteen (0/19)  -  Margins uninvolved by carcinoma  -  Appendix uninvolved by carcinoma  -  Tubular adenoma, incidental (0.3 cm)  -  See oncology table and comment below    12/15/2019 Cancer Staging   Staging form: Colon and Rectum, AJCC 8th Edition - Pathologic stage from 12/15/2019: Stage IIA (pT3, pN0, cM0) - Signed by Truitt Merle, MD on 01/14/2020      CURRENT THERAPY:  Surveillance   INTERVAL HISTORY:  Jamie [name] is here for a follow up. She presents to the clinic alone. She notes she has been doing well. She notes mostly normal BM with occasional mild constipation. She notes she is eating well and able to gain weight. She also notes occasional lower back pain which resolved with Tylenol. She related this to her arthritis. She feels she has recovered well from surgery. She notes itching at incision site, but no pain. She lives with family and is independent. I reviewed medication list  with her. She took eliquis for 3 months.   She notes she saw her PCP 2 weeks ago.    REVIEW OF SYSTEMS:   Constitutional: Denies fevers, chills or abnormal weight loss Eyes: Denies blurriness of vision Ears, nose, mouth, throat, and face: Denies mucositis or sore throat Respiratory:  Denies cough, dyspnea or wheezes Cardiovascular: Denies palpitation, chest discomfort or lower extremity swelling Gastrointestinal:  Denies nausea, heartburn or change in bowel habits Skin: Denies abnormal skin rashes Lymphatics: Denies new lymphadenopathy or easy bruising Neurological:Denies numbness, tingling or new weaknesses Behavioral/Psych: Mood is stable, no new changes  All other systems were reviewed with the patient and are negative.  MEDICAL HISTORY:  Past Medical History:  Diagnosis Date  . Adenocarcinoma of cecum (HCC) 09/28/2019  . ANEMIA, IRON DEFICIENCY 09/10/2007  . Anxiety   . Arthritis   . Asthma   . Blood transfusion without reported diagnosis   . Cataract 2008   bilateral cataract extraction  . CATARACT EXTRACTION, HX OF 09/10/2007  . DYSPEPSIA, HX OF 09/10/2007  . GERD (gastroesophageal reflux disease)   . HAIR LOSS 03/21/2009  . HAY FEVER 09/10/2007  . HEARING LOSS, SENSORINEURAL 09/10/2007  . HYPERLIPIDEMIA 09/10/2007  . HYPERTENSION 09/10/2007  . LOW BACK PAIN 05/10/2008  . Pain in joint, hand 03/01/2008  . PEPTIC ULCER DISEASE 09/10/2007  . POLYP, COLON 09/10/2007  . POLYPECTOMY, HX OF 09/10/2007  . Pulmonary embolism (HCC) 1- 2 non-occlusive 10/10/2019  . SHINGLES 08/13/2009  . SHOULDER PAIN, LEFT 02/12/2008  . TAH/BSO, HX OF 09/10/2007  . Tuberculosis    PATIENT DENIES AT PHONE INTERVIEW ON 12/12/2019    SURGICAL HISTORY: Past Surgical History:  Procedure Laterality Date  . ABDOMINAL HYSTERECTOMY    . BREAST SURGERY     breast biopsy-benign  . CATARACT EXTRACTION    . COLONOSCOPY    . EYE SURGERY     bilateral cataract with lens implant  . IRRIGATION AND DEBRIDEMENT SEBACEOUS CYST    . LAPAROSCOPIC RIGHT HEMI COLECTOMY Right 12/15/2019   Procedure: LAPAROSCOPIC RIGHT HEMI COLECTOMY WITH LYSIS OF ADHESIONS;  Surgeon: White, Christopher M, MD;  Location: [facility];  Service: General;  Laterality: Right;  . TOTAL ABDOMINAL HYSTERECTOMY W/ BILATERAL  SALPINGOOPHORECTOMY      I have reviewed the social history and family history with the patient and they are unchanged from previous note.  ALLERGIES:  is allergic to aspirin and latex.  MEDICATIONS:  Current Outpatient Medications  Medication Sig Dispense Refill  . acetaminophen (TYLENOL) 500 MG tablet Take 500-1,000 mg by mouth every 6 (six) hours as needed (for pain.).     . ALPRAZolam (XANAX) 0.5 MG tablet TAKE 1 TABLET BY MOUTH EVERY DAY AS NEEDED FOR ANXIETY 20 tablet 2  . alum & mag hydroxide-simeth (MYLANTA) 200-200-20 MG/5ML suspension Take 15 mLs by mouth every 6 (six) hours as needed for indigestion or heartburn.    . amLODipine (NORVASC) 5 MG tablet TAKE 1 TABLET BY MOUTH EVERY DAY 90 tablet 1  . atorvastatin (LIPITOR) 20 MG tablet Take 1 tablet (20 mg total) by mouth daily at 6 PM. Annual appt due in Oct must see provider for future refills 90 tablet 1  . BREO ELLIPTA 100-25 MCG/INH AEPB INHALE 1 PUFF BY MOUTH EVERY DAY 60 each 3  . famotidine (PEPCID) 20 MG tablet Take 1 tablet (20 mg total) by mouth 2 (two) times daily. 180 tablet 3  . ferrous sulfate 325 (65 FE) MG tablet Take 1 tablet (325   mg total) by mouth 2 (two) times daily with a meal. 60 tablet 3  . furosemide (LASIX) 40 MG tablet TAKE 1 TABLET BY MOUTH EVERY DAY (Patient taking differently: Take 40 mg by mouth daily. ) 90 tablet 1  . olmesartan (BENICAR) 40 MG tablet TAKE 1 TABLET BY MOUTH EVERY DAY (Patient taking differently: Take 40 mg by mouth daily. ) 90 tablet 3  . ondansetron (ZOFRAN-ODT) 4 MG disintegrating tablet Take 1 tablet (4 mg total) by mouth every 8 (eight) hours as needed for nausea or vomiting. 60 tablet 0  . PROAIR HFA 108 (90 Base) MCG/ACT inhaler INHALE 2 PUFFS INTO THE LUNGS EVERY 4 (FOUR) HOURS AS NEEDED FOR WHEEZING OR SHORTNESS OF BREATH. (Patient taking differently: Inhale 2 puffs into the lungs every 4 (four) hours as needed for wheezing or shortness of breath. ) 25.5 g 3  . sennosides-docusate  sodium (SENOKOT-S) 8.6-50 MG tablet Take 1 tablet by mouth daily as needed for constipation.    . triamcinolone cream (KENALOG) 0.1 % APPLY TO AFFECTED AREA TWICE A DAY (Patient taking differently: Apply 1 application topically 2 (two) times daily as needed (skin irritation/rash). ) 105 g 3   No current facility-administered medications for this visit.    PHYSICAL EXAMINATION: ECOG PERFORMANCE STATUS: 0 - Asymptomatic  Vitals:   04/13/20 0949 04/13/20 0950  BP: (!) 165/60 (!) 154/59  Pulse: 83   Resp: 18   Temp: 98.9 F (37.2 C)   SpO2: 100%    Filed Weights   04/13/20 0949  Weight: 168 lb 8 oz (76.4 kg)    GENERAL:alert, no distress and comfortable SKIN: skin color, texture, turgor are normal, no rashes or significant lesions EYES: normal, Conjunctiva are pink and non-injected, sclera clear  NECK: supple, thyroid normal size, non-tender, without nodularity LYMPH:  no palpable lymphadenopathy in the cervical, axillary  LUNGS: clear to auscultation and percussion with normal breathing effort HEART: regular rate & rhythm and no murmurs and no lower extremity edema ABDOMEN:abdomen soft, non-tender and normal bowel sounds (+) Midline surgical incision healed well  Musculoskeletal:no cyanosis of digits and no clubbing  NEURO: alert & oriented x 3 with fluent speech, no focal motor/sensory deficits  LABORATORY DATA:  I have reviewed the data as listed CBC Latest Ref Rng & Units 04/13/2020 01/16/2020 12/18/2019  WBC 4.0 - 10.5 K/uL 4.9 5.0 10.0  Hemoglobin 12.0 - 15.0 g/dL 13.1 11.5(L) 7.0(L)  Hematocrit 36.0 - 46.0 % 39.9 36.5 23.9(L)  Platelets 150 - 400 K/uL 205 218 295     CMP Latest Ref Rng & Units 04/13/2020 01/16/2020 12/18/2019  Glucose 70 - 99 mg/dL 105(H) 103(H) 69(L)  BUN 8 - 23 mg/dL _0 Creatinine 0.44 - 1.00 mg/dL 0.90 0.82 0.74  Sodium 135 - 145 mmol/L 142 145 141  Potassium 3.5 - 5.1 mmol/L 3.7 3.8 3.4(L)  Chloride 98 - 111 mmol/L 108 110 111  CO2 22 - 32  mmol/L _1 Calcium 8.9 - 10.3 mg/dL 9.3 9.3 8.1(L)  Total Protein 6.5 - 8.1 g/dL 8.1 7.7 -  Total Bilirubin 0.3 - 1.2 mg/dL 0.5 0.4 -  Alkaline Phos 38 - 126 U/L 170(H) 140(H) -  AST 15 - 41 U/L 17 15 -  ALT 0 - 44 U/L 14 18 -      RADIOGRAPHIC STUDIES: I have personally reviewed the radiological images as listed and agreed with the findings in the report. No results found.   ASSESSMENT & PLAN:  Jamie [name] is a 87 y.o. female with    1. Right colon cancer, pT3N0M0, stage IIA, MSI-H -She was recently diagnosed in 10/2019. She has biopsy-proven cecal adenocarcinoma,staging CT scan was negative for distant metastasis. -She underwent curative right hemicolectomy with Dr. White on 12/15/19. Her path showed complete surgical resection of 7cm tumor and negative 19 LNs were removed.  -Her MMR result which shows her cancer has MLH1 and PMS2 loss, and MLH hypermethylation positive, support sporadic cancer, not Lynch syndrome.   -Given her early stage cancer with complete surgical resection, I do not recommend adjuvant chemotherapy.   -I previously discussed the risk of cancer recurrence in the future, which is about 20%. I discussed the surveillance plan, which is a physical exam and lab test (including CBC, CMP and CEA) every 3 months for the first 2 years, then every 6-12 months, colonoscopy in one year, and surveillance CT scan every 6-12 month for up to 5 year.  -She is clinically doing well and recovered from surgery well. Physical exam unremarkable. Labs reviewed, CBC and CMP WNL except alk 170. CEA still pending.  -Continue Surveillance. Plan for CT CAP scan end of 2021.  -f/u in 6    2. LUL pulmonary embolism  -Seen on 10/2019 CT chest, she was asymptomatic. -Lower extremely Doppler was negative for DVT -I think her small volume PE is related to her colon cancer. She was treated with Eliquis 2.5mg for 4-5 months, off now   3. Iron deficient anemia -secondary to  #1 -I encourage her to take oral iron.  -Anemia from recent surgery has resolved   4. HTN, GERD -f/u with PCP   PLAN: -Lab and F/u in 6 months   No problem-specific Assessment & Plan notes found for this encounter.   No orders of the defined types were placed in this encounter.  All questions were answered. The patient knows to call the clinic with any problems, questions or concerns. No barriers to learning was detected. The total time spent in the appointment was 25 minutes.     Yan Feng, MD 04/13/2020   I, Amoya Bennett, am acting as scribe for Yan Feng, MD.   I have reviewed the above documentation for accuracy and completeness, and I agree with the above.       

## 2020-04-13 ENCOUNTER — Other Ambulatory Visit: Payer: Self-pay

## 2020-04-13 ENCOUNTER — Inpatient Hospital Stay: Payer: Medicare PPO

## 2020-04-13 ENCOUNTER — Encounter: Payer: Self-pay | Admitting: Hematology

## 2020-04-13 ENCOUNTER — Inpatient Hospital Stay: Payer: Medicare PPO | Attending: Hematology | Admitting: Hematology

## 2020-04-13 VITALS — BP 154/59 | HR 83 | Temp 98.9°F | Resp 18 | Ht 62.0 in | Wt 168.5 lb

## 2020-04-13 DIAGNOSIS — D508 Other iron deficiency anemias: Secondary | ICD-10-CM | POA: Diagnosis not present

## 2020-04-13 DIAGNOSIS — Z86711 Personal history of pulmonary embolism: Secondary | ICD-10-CM | POA: Diagnosis not present

## 2020-04-13 DIAGNOSIS — I10 Essential (primary) hypertension: Secondary | ICD-10-CM | POA: Insufficient documentation

## 2020-04-13 DIAGNOSIS — K5900 Constipation, unspecified: Secondary | ICD-10-CM | POA: Diagnosis not present

## 2020-04-13 DIAGNOSIS — I700 Atherosclerosis of aorta: Secondary | ICD-10-CM | POA: Insufficient documentation

## 2020-04-13 DIAGNOSIS — C180 Malignant neoplasm of cecum: Secondary | ICD-10-CM | POA: Insufficient documentation

## 2020-04-13 DIAGNOSIS — E785 Hyperlipidemia, unspecified: Secondary | ICD-10-CM | POA: Diagnosis not present

## 2020-04-13 DIAGNOSIS — Z7951 Long term (current) use of inhaled steroids: Secondary | ICD-10-CM | POA: Insufficient documentation

## 2020-04-13 DIAGNOSIS — M1990 Unspecified osteoarthritis, unspecified site: Secondary | ICD-10-CM | POA: Insufficient documentation

## 2020-04-13 DIAGNOSIS — Z79899 Other long term (current) drug therapy: Secondary | ICD-10-CM | POA: Insufficient documentation

## 2020-04-13 DIAGNOSIS — K219 Gastro-esophageal reflux disease without esophagitis: Secondary | ICD-10-CM | POA: Insufficient documentation

## 2020-04-13 LAB — CBC WITH DIFFERENTIAL (CANCER CENTER ONLY)
Abs Immature Granulocytes: 0.01 10*3/uL (ref 0.00–0.07)
Basophils Absolute: 0 10*3/uL (ref 0.0–0.1)
Basophils Relative: 1 %
Eosinophils Absolute: 0.3 10*3/uL (ref 0.0–0.5)
Eosinophils Relative: 5 %
HCT: 39.9 % (ref 36.0–46.0)
Hemoglobin: 13.1 g/dL (ref 12.0–15.0)
Immature Granulocytes: 0 %
Lymphocytes Relative: 31 %
Lymphs Abs: 1.5 10*3/uL (ref 0.7–4.0)
MCH: 30.9 pg (ref 26.0–34.0)
MCHC: 32.8 g/dL (ref 30.0–36.0)
MCV: 94.1 fL (ref 80.0–100.0)
Monocytes Absolute: 0.4 10*3/uL (ref 0.1–1.0)
Monocytes Relative: 9 %
Neutro Abs: 2.7 10*3/uL (ref 1.7–7.7)
Neutrophils Relative %: 54 %
Platelet Count: 205 10*3/uL (ref 150–400)
RBC: 4.24 MIL/uL (ref 3.87–5.11)
RDW: 13.9 % (ref 11.5–15.5)
WBC Count: 4.9 10*3/uL (ref 4.0–10.5)
nRBC: 0 % (ref 0.0–0.2)

## 2020-04-13 LAB — CMP (CANCER CENTER ONLY)
ALT: 14 U/L (ref 0–44)
AST: 17 U/L (ref 15–41)
Albumin: 3.9 g/dL (ref 3.5–5.0)
Alkaline Phosphatase: 170 U/L — ABNORMAL HIGH (ref 38–126)
Anion gap: 10 (ref 5–15)
BUN: 11 mg/dL (ref 8–23)
CO2: 24 mmol/L (ref 22–32)
Calcium: 9.3 mg/dL (ref 8.9–10.3)
Chloride: 108 mmol/L (ref 98–111)
Creatinine: 0.9 mg/dL (ref 0.44–1.00)
GFR, Est AFR Am: >60 mL/min (ref >60)
GFR, Estimated: 58 mL/min — ABNORMAL LOW (ref >60)
Glucose, Bld: 105 mg/dL — ABNORMAL HIGH (ref 70–99)
Potassium: 3.7 mmol/L (ref 3.5–5.1)
Sodium: 142 mmol/L (ref 135–145)
Total Bilirubin: 0.5 mg/dL (ref 0.3–1.2)
Total Protein: 8.1 g/dL (ref 6.5–8.1)

## 2020-04-13 LAB — CEA (IN HOUSE-CHCC): CEA (CHCC-In House): 2.56 ng/mL (ref 0.00–5.00)

## 2020-05-02 ENCOUNTER — Other Ambulatory Visit: Payer: Self-pay | Admitting: Internal Medicine

## 2020-05-29 DIAGNOSIS — H04123 Dry eye syndrome of bilateral lacrimal glands: Secondary | ICD-10-CM | POA: Diagnosis not present

## 2020-05-29 DIAGNOSIS — Z1231 Encounter for screening mammogram for malignant neoplasm of breast: Secondary | ICD-10-CM | POA: Diagnosis not present

## 2020-06-04 ENCOUNTER — Other Ambulatory Visit: Payer: Self-pay | Admitting: Internal Medicine

## 2020-06-08 ENCOUNTER — Other Ambulatory Visit: Payer: Self-pay | Admitting: Internal Medicine

## 2020-07-01 ENCOUNTER — Other Ambulatory Visit: Payer: Self-pay | Admitting: Internal Medicine

## 2020-07-24 DIAGNOSIS — C189 Malignant neoplasm of colon, unspecified: Secondary | ICD-10-CM | POA: Diagnosis not present

## 2020-08-01 ENCOUNTER — Other Ambulatory Visit: Payer: Self-pay | Admitting: Internal Medicine

## 2020-09-27 ENCOUNTER — Other Ambulatory Visit: Payer: Self-pay | Admitting: Internal Medicine

## 2020-09-27 DIAGNOSIS — E782 Mixed hyperlipidemia: Secondary | ICD-10-CM

## 2020-09-29 ENCOUNTER — Other Ambulatory Visit: Payer: Self-pay | Admitting: Internal Medicine

## 2020-09-29 ENCOUNTER — Ambulatory Visit: Payer: Medicare PPO | Attending: Internal Medicine

## 2020-09-29 DIAGNOSIS — Z23 Encounter for immunization: Secondary | ICD-10-CM

## 2020-09-29 NOTE — Progress Notes (Signed)
   Covid-19 Vaccination Clinic  Name:  Jamie [name]    MRN: [id number] DOB: 1933-11-10  09/29/2020  [name] was observed post Covid-19 immunization for 15 minutes without incident. She was provided with Vaccine Information Sheet and instruction to access the V-Safe system.   [name] was instructed to call 911 with any severe reactions post vaccine: Marland Kitchen Difficulty breathing  . Swelling of face and throat  . A fast heartbeat  . A bad rash all over body  . Dizziness and weakness

## 2020-10-05 ENCOUNTER — Other Ambulatory Visit: Payer: Self-pay | Admitting: Internal Medicine

## 2020-10-12 ENCOUNTER — Ambulatory Visit (INDEPENDENT_AMBULATORY_CARE_PROVIDER_SITE_OTHER): Payer: Medicare PPO | Admitting: Internal Medicine

## 2020-10-12 ENCOUNTER — Encounter: Payer: Self-pay | Admitting: Internal Medicine

## 2020-10-12 ENCOUNTER — Other Ambulatory Visit: Payer: Self-pay

## 2020-10-12 VITALS — BP 152/60 | HR 95 | Temp 99.1°F | Wt 180.8 lb

## 2020-10-12 DIAGNOSIS — J4531 Mild persistent asthma with (acute) exacerbation: Secondary | ICD-10-CM

## 2020-10-12 DIAGNOSIS — Z0000 Encounter for general adult medical examination without abnormal findings: Secondary | ICD-10-CM

## 2020-10-12 DIAGNOSIS — I10 Essential (primary) hypertension: Secondary | ICD-10-CM

## 2020-10-12 DIAGNOSIS — Z23 Encounter for immunization: Secondary | ICD-10-CM | POA: Diagnosis not present

## 2020-10-12 DIAGNOSIS — F419 Anxiety disorder, unspecified: Secondary | ICD-10-CM

## 2020-10-12 DIAGNOSIS — E782 Mixed hyperlipidemia: Secondary | ICD-10-CM

## 2020-10-12 MED ORDER — PREDNISONE 20 MG PO TABS: 40.0000 mg | ORAL_TABLET | Freq: Every day | ORAL | 0 refills | Status: DC

## 2020-10-12 NOTE — Progress Notes (Signed)
   Subjective:   Patient ID: Jamie [name], female    DOB: Jun 13, 1933, 86 y.o.   MRN: [id number]  HPI The patient is an 86 YO female coming in for physical.   PMH, [place], social history reviewed and updated  Review of Systems  Constitutional: Negative.   HENT: Negative.   Eyes: Negative.   Respiratory: Negative for cough, chest tightness and shortness of breath.   Cardiovascular: Negative for chest pain, palpitations and leg swelling.  Gastrointestinal: Negative for abdominal distention, abdominal pain, constipation, diarrhea, nausea and vomiting.  Musculoskeletal: Negative.   Skin: Negative.   Neurological: Negative.   Psychiatric/Behavioral: Negative.     Objective:  Physical Exam Constitutional:      Appearance: She is well-developed.  HENT:     Head: Normocephalic and atraumatic.  Cardiovascular:     Rate and Rhythm: Normal rate and regular rhythm.  Pulmonary:     Effort: Pulmonary effort is normal. No respiratory distress.     Breath sounds: Normal breath sounds. No wheezing or rales.  Abdominal:     General: Bowel sounds are normal. There is no distension.     Palpations: Abdomen is soft.     Tenderness: There is no abdominal tenderness. There is no rebound.  Musculoskeletal:     Cervical back: Normal range of motion.  Skin:    General: Skin is warm and dry.  Neurological:     Mental Status: She is alert and oriented to person, place, and time.     Coordination: Coordination normal.     Vitals:   10/12/20 0821  BP: (!) 152/60  Pulse: 95  Temp: 99.1 F (37.3 C)  TempSrc: Oral  SpO2: 97%  Weight: 180 lb 12.8 oz (82 kg)    This visit occurred during the SARS-CoV-2 public health emergency.  Safety protocols were in place, including screening questions prior to the visit, additional usage of staff PPE, and extensive cleaning of exam room while observing appropriate contact time as indicated for disinfecting solutions.   Assessment & Plan:  Flu shot  given at visit

## 2020-10-12 NOTE — Assessment & Plan Note (Signed)
Xanax controls symptoms and she wishes to continue.

## 2020-10-12 NOTE — Assessment & Plan Note (Signed)
Flu shot given at visit. Covid-19 up to date including booster. Pneumonia complete. Shingrix counseled. Tetanus due declines. Colonoscopy due now. Mammogram aged out of further, pap smear aged out of further and dexa declines further. Counseled about sun safety and mole surveillance. Counseled about the dangers of distracted driving. Given 10 year screening recommendations.

## 2020-10-12 NOTE — Assessment & Plan Note (Signed)
Continue lipitor 20 mg daily. Defers labs today as labs with cancer center next week.

## 2020-10-12 NOTE — Progress Notes (Signed)
[place]   [phone]) [phone] [phone]) [phone]   Clinic Follow up Note   Patient Care Team: Hoyt Koch, MD as PCP - General (Internal Medicine) Karl Luke, MD as Referring Physician (Optometry) Dema Severin, Sharon Mt, MD as Consulting Physician (General Surgery) Truitt Merle, MD as Consulting Physician (Hematology) Gatha Mayer, MD as Consulting Physician (Gastroenterology)  Date of Service:  10/17/2020  CHIEF COMPLAINT: F/u of right colon cancer  SUMMARY OF ONCOLOGIC HISTORY: Oncology History Overview Note  Cancer Staging Adenocarcinoma of cecum [facility]) Staging form: Colon and Rectum, AJCC 8th Edition - Clinical: Stage Unknown (cTX, cN0, cM0) - Signed by Truitt Merle, MD on 10/17/2019 - Pathologic stage from 12/15/2019: Stage IIA (pT3, pN0, cM0) - Signed by Truitt Merle, MD on 01/14/2020    Adenocarcinoma of cecum ([place])  09/26/2019 Imaging   CT AP W Contrast  IMPRESSION: Large cecal mass is noted concerning for malignancy. Colonoscopy is recommended for further evaluation. These results will be called to the ordering clinician or representative by the Radiologist Assistant, and communication documented in the PACS or zVision Dashboard.   Aortic Atherosclerosis (ICD10-I70.0).   09/28/2019 Initial Diagnosis   Adenocarcinoma of cecum ([place])   10/04/2019 Pathology Results   Cecal mass biopsy showed adenocarcinoma   10/04/2019 Procedure   -Malignant tumor in the cecum. Biopsied. - Diverticulosis in the sigmoid colon. - The examination was otherwise normal   10/04/2019 Miscellaneous   MMR showed loss of MLH1 and PMS2 expression    10/10/2019 Imaging   CT chest W contrast  IMPRESSION: 1. Small volume nonocclusive pulmonary embolus identified in segmental left upper lobe pulmonary artery and subsegmental pulmonary artery to the left lower lobe. Left lower lobe embolus has a string like configuration suggesting that these findings may well be  subacute to chronic. Probable trace nonobstructive embolus in a subsegmental artery to the right lower lobe. 2. No evidence for metastatic disease in the thorax. 3.  Aortic Atherosclerois (ICD10-170.0)   10/17/2019 Cancer Staging   Staging form: Colon and Rectum, AJCC 8th Edition - Clinical: Stage Unknown (cTX, cN0, cM0) - Signed by Truitt Merle, MD on 10/17/2019   12/15/2019 Surgery   LAPAROSCOPIC RIGHT HEMI COLECTOMY WITH LYSIS OF ADHESIONS by Dr. Dema Severin     12/15/2019 Pathology Results   FINAL MICROSCOPIC DIAGNOSIS:   A. COLON, RIGHT, RESECTION:  -  Adenocarcinoma, moderately differentiated, 7 cm  -  Carcinoma invades through muscularis propria into pericolorectal  tissue  -  No carcinoma identified in nineteen (0/19)  -  Margins uninvolved by carcinoma  -  Appendix uninvolved by carcinoma  -  Tubular adenoma, incidental (0.3 cm)  -  See oncology table and comment below    12/15/2019 Cancer Staging   Staging form: Colon and Rectum, AJCC 8th Edition - Pathologic stage from 12/15/2019: Stage IIA (pT3, pN0, cM0) - Signed by Truitt Merle, MD on 01/14/2020      CURRENT THERAPY:  Surveillance  INTERVAL HISTORY:  Jamie [name] is here for a follow up of colon cancer. She was last seen by me 6 months ago. She presents to the clinic alone. She notes she is doing well. She denies nay bloating or abdominal pain. She notes her BM can be constipated but manageable. She denies bloody or black stool. She notes her appetite, eating and energy is good. She denies any major concerns. She notes she is due soon for colonoscopy. She notes she saw her PCP 5 days ago. She notes she is  on Lasix since 05/2020.     REVIEW OF SYSTEMS:   Constitutional: Denies fevers, chills or abnormal weight loss Eyes: Denies blurriness of vision Ears, nose, mouth, throat, and face: Denies mucositis or sore throat Respiratory: Denies cough, dyspnea or wheezes Cardiovascular: Denies palpitation, chest discomfort or  lower extremity swelling Gastrointestinal:  Denies nausea, heartburn or change in bowel habits Skin: Denies abnormal skin rashes Lymphatics: Denies new lymphadenopathy or easy bruising Neurological:Denies numbness, tingling or new weaknesses Behavioral/Psych: Mood is stable, no new changes  All other systems were reviewed with the patient and are negative.  MEDICAL HISTORY:  Past Medical History:  Diagnosis Date  . Adenocarcinoma of cecum ([place]) 09/28/2019  . ANEMIA, IRON DEFICIENCY 09/10/2007  . Anxiety   . Arthritis   . Asthma   . Blood transfusion without reported diagnosis   . Cataract 2008   bilateral cataract extraction  . CATARACT EXTRACTION, HX OF 09/10/2007  . DYSPEPSIA, HX OF 09/10/2007  . GERD (gastroesophageal reflux disease)   . HAIR LOSS 03/21/2009  . HAY FEVER 09/10/2007  . HEARING LOSS, SENSORINEURAL 09/10/2007  . HYPERLIPIDEMIA 09/10/2007  . HYPERTENSION 09/10/2007  . LOW BACK PAIN 05/10/2008  . Pain in joint, hand 03/01/2008  . PEPTIC ULCER DISEASE 09/10/2007  . POLYP, COLON 09/10/2007  . POLYPECTOMY, HX OF 09/10/2007  . Pulmonary embolism ([place]) 1- 2 non-occlusive 10/10/2019  . SHINGLES 08/13/2009  . SHOULDER PAIN, LEFT 02/12/2008  . TAH/BSO, HX OF 09/10/2007  . Tuberculosis    PATIENT DENIES AT PHONE INTERVIEW ON 12/12/2019    SURGICAL HISTORY: Past Surgical History:  Procedure Laterality Date  . ABDOMINAL HYSTERECTOMY    . BREAST SURGERY     breast biopsy-benign  . CATARACT EXTRACTION    . COLONOSCOPY    . EYE SURGERY     bilateral cataract with lens implant  . IRRIGATION AND DEBRIDEMENT SEBACEOUS CYST    . LAPAROSCOPIC RIGHT HEMI COLECTOMY Right 12/15/2019   Procedure: LAPAROSCOPIC RIGHT HEMI COLECTOMY WITH LYSIS OF ADHESIONS;  Surgeon: Ileana Roup, MD;  Location: [facility];  Service: General;  Laterality: Right;  . TOTAL ABDOMINAL HYSTERECTOMY W/ BILATERAL SALPINGOOPHORECTOMY      I have reviewed the social history and family history with the  patient and they are unchanged from previous note.  ALLERGIES:  is allergic to aspirin and latex.  MEDICATIONS:  Current Outpatient Medications  Medication Sig Dispense Refill  . acetaminophen (TYLENOL) 500 MG tablet Take 500-1,000 mg by mouth every 6 (six) hours as needed (for pain.).     Marland Kitchen albuterol (VENTOLIN HFA) 108 (90 Base) MCG/ACT inhaler INHALE 2 PUFFS INTO THE LUNGS EVERY 4 (FOUR) HOURS AS NEEDED FOR WHEEZING OR SHORTNESS OF BREATH. 25.5 g 2  . ALPRAZolam (XANAX) 0.5 MG tablet TAKE 1 TABLET BY MOUTH EVERY DAY AS NEEDED FOR ANXIETY 20 tablet 2  . alum & mag hydroxide-simeth (MYLANTA) 665-993-57 MG/5ML suspension Take 15 mLs by mouth every 6 (six) hours as needed for indigestion or heartburn.    Marland Kitchen amLODipine (NORVASC) 5 MG tablet TAKE 1 TABLET BY MOUTH EVERY DAY 90 tablet 1  . atorvastatin (LIPITOR) 20 MG tablet TAKE 1 TABLET (20 MG TOTAL) BY MOUTH DAILY AT 6 PM. ANNUAL APPT DUE IN OCT MUST SEE PROVIDER FOR FUTURE REFILLS 90 tablet 0  . BREO ELLIPTA 100-25 MCG/INH AEPB INHALE 1 PUFF BY MOUTH EVERY DAY 60 each 3  . famotidine (PEPCID) 20 MG tablet TAKE 1 TABLET BY MOUTH TWICE A DAY 180 tablet 1  .  furosemide (LASIX) 40 MG tablet TAKE 1 TABLET BY MOUTH EVERY DAY 90 tablet 1  . olmesartan (BENICAR) 40 MG tablet TAKE 1 TABLET BY MOUTH EVERY DAY 90 tablet 3  . ondansetron (ZOFRAN-ODT) 4 MG disintegrating tablet Take 1 tablet (4 mg total) by mouth every 8 (eight) hours as needed for nausea or vomiting. 60 tablet 0  . potassium chloride (KLOR-CON) 10 MEQ tablet Take 1 tablet (10 mEq total) by mouth daily. 30 tablet 0  . predniSONE (DELTASONE) 20 MG tablet Take 2 tablets (40 mg total) by mouth daily with breakfast. 10 tablet 0  . sennosides-docusate sodium (SENOKOT-S) 8.6-50 MG tablet Take 1 tablet by mouth daily as needed for constipation.    . triamcinolone cream (KENALOG) 0.1 % APPLY TO AFFECTED AREA TWICE A DAY (Patient taking differently: Apply 1 application topically 2 (two) times daily as  needed (skin irritation/rash). ) 105 g 3   No current facility-administered medications for this visit.    PHYSICAL EXAMINATION: ECOG PERFORMANCE STATUS: 0 - Asymptomatic  Vitals:   10/17/20 1016  BP: (!) 171/68  Pulse: 92  Resp: 17  Temp: (!) 97.4 F (36.3 C)  SpO2: 98%   Filed Weights   10/17/20 1016  Weight: 180 lb 9.6 oz (81.9 kg)    GENERAL:alert, no distress and comfortable SKIN: skin color, texture, turgor are normal, no rashes or significant lesions EYES: normal, Conjunctiva are pink and non-injected, sclera clear  NECK: supple, thyroid normal size, non-tender, without nodularity LYMPH:  no palpable lymphadenopathy in the cervical, axillary  LUNGS: clear to auscultation and percussion with normal breathing effort HEART: regular rate & rhythm and no murmurs and no lower extremity edema ABDOMEN:abdomen soft, non-tender and normal bowel sounds Musculoskeletal:no cyanosis of digits and no clubbing  NEURO: alert & oriented x 3 with fluent speech, no focal motor/sensory deficits  LABORATORY DATA:  I have reviewed the data as listed CBC Latest Ref Rng & Units 10/17/2020 04/13/2020 01/16/2020  WBC 4.0 - 10.5 K/uL 10.1 4.9 5.0  Hemoglobin 12.0 - 15.0 g/dL 13.6 13.1 11.5(L)  Hematocrit 36 - 46 % 41.4 39.9 36.5  Platelets 150 - 400 K/uL 239 205 218     CMP Latest Ref Rng & Units 10/17/2020 04/13/2020 01/16/2020  Glucose 70 - 99 mg/dL 109(H) 105(H) 103(H)  BUN 8 - 23 mg/dL _0 Creatinine 0.44 - 1.00 mg/dL 0.86 0.90 0.82  Sodium 135 - 145 mmol/L 143 142 145  Potassium 3.5 - 5.1 mmol/L 3.2(L) 3.7 3.8  Chloride 98 - 111 mmol/L 110 108 110  CO2 22 - 32 mmol/L _1 Calcium 8.9 - 10.3 mg/dL 9.3 9.3 9.3  Total Protein 6.5 - 8.1 g/dL 7.8 8.1 7.7  Total Bilirubin 0.3 - 1.2 mg/dL 0.6 0.5 0.4  Alkaline Phos 38 - 126 U/L 153(H) 170(H) 140(H)  AST 15 - 41 U/L _2 ALT 0 - 44 U/L _3 RADIOGRAPHIC STUDIES: I have personally reviewed the radiological  images as listed and agreed with the findings in the report. No results found.   ASSESSMENT & PLAN:  Jamie [name] is a 86 y.o. female with    1. Right colon cancer,pT3N0M0,stage IIA,MSI-H -She was diagnosed in 10/2019.She has biopsy-proven cecal adenocarcinoma,stagingCT scan was negative for distant metastasis. -She underwent curative right hemicolectomy with Dr. Dema Severin on 12/15/19. Her path showedcomplete surgical resection of 7cm tumor and negative 19 LNs were removed.  -Her MMRresultsshow her  cancer hasMLH1 and PMS2 loss, and MLH hypermethylation positive, supports sporadic cancer, not Lynch syndrome. -Given her early stage cancer with completesurgical resection,I did not recommend adjuvant chemotherapy. She is currently on surveillance.  -She is clinically doing very well. Labs reviewed, CBC and CMP WNL except K 3.2, BG 109, Alk Phos 153. CEA normal. Physical exam unremarkable.  -She is 1 year since her cancer diagnosis. Her risk of recurrence decreases over time  -Continue surveillance. She is due for colonoscopy. She can proceed by 12/2020. Given she is clinically doing well will proceed with CT scan before next visit.  -F/u in 4 months. I encouraged her to contact clinic with any concerns in interim.   2. LUL pulmonary embolism  -Seen on 10/2019 CTchest,she was asymptomatic. Lower extremely Doppler was negative for DVT -She was treated with Eliquis 2.13m for 4-5 months, off now   3.  HTN, GERD -Continue to f/u with PCP, BP not well controlled  -She ambulates with walker.   4. Hypokalemia  -K at 3.2 today (10/17/20).  -I discussed this is likely due Lasix use for her HTN. -I called in oral potassium today to take once daily for 1 month and increase potassium rich food in her diet. If she continues Lasix she may need to continue oral potassium.    PLAN: -I called in oral potassium today.  -Proceed with colonoscopy by 12/2020 -F/u in 4 months with lab  and CT scan a few days before.  -Copy note to PCP about Lasix use.     No problem-specific Assessment & Plan notes found for this encounter.   Orders Placed This Encounter  Procedures  . CT CHEST ABDOMEN PELVIS W CONTRAST    Standing Status:   Future    Standing Expiration Date:   10/18/2021    Order Specific Question:   If indicated for the ordered procedure, I authorize the administration of contrast media per Radiology protocol    Answer:   Yes    Order Specific Question:   [facility]    [facility]   Order Specific Question:   Release to patient    Answer:   Immediate    Order Specific Question:   Is Oral Contrast requested for this exam?    Answer:   Yes, Per Radiology protocol    Order Specific Question:   Reason for Exam (SYMPTOM  OR DIAGNOSIS REQUIRED)    Answer:   survelliance   All questions were answered. The patient knows to call the clinic with any problems, questions or concerns. No barriers to learning was detected. The total time spent in the appointment was 30 minutes.     YTruitt Merle MD 10/17/2020   I, AJoslyn Devon am acting as scribe for YTruitt Merle MD.   I have reviewed the above documentation for accuracy and completeness, and I agree with the above.

## 2020-10-12 NOTE — Patient Instructions (Addendum)
You can ask the pharmacy about the shingles shot.  We have sent in prednisone to take for the next 5 days to help the lungs.    Health Maintenance, Female Adopting a healthy lifestyle and getting preventive care are important in promoting health and wellness. Ask your health care provider about:  The right schedule for you to have regular tests and exams.  Things you can do on your own to prevent diseases and keep yourself healthy. What should I know about diet, weight, and exercise? Eat a healthy diet   Eat a diet that includes plenty of vegetables, fruits, low-fat dairy products, and lean protein.  Do not eat a lot of foods that are high in solid fats, added sugars, or sodium. Maintain a healthy weight Body mass index (BMI) is used to identify weight problems. It estimates body fat based on height and weight. Your health care provider can help determine your BMI and help you achieve or maintain a healthy weight. Get regular exercise Get regular exercise. This is one of the most important things you can do for your health. Most adults should:  Exercise for at least 150 minutes each week. The exercise should increase your heart rate and make you sweat (moderate-intensity exercise).  Do strengthening exercises at least twice a week. This is in addition to the moderate-intensity exercise.  Spend less time sitting. Even light physical activity can be beneficial. Watch cholesterol and blood lipids Have your blood tested for lipids and cholesterol at 84 years of age, then have this test every 5 years. Have your cholesterol levels checked more often if:  Your lipid or cholesterol levels are high.  You are older than 84 years of age.  You are at high risk for heart disease. What should I know about cancer screening? Depending on your health history and family history, you may need to have cancer screening at various ages. This may include screening for:  Breast cancer.  Cervical  cancer.  Colorectal cancer.  Skin cancer.  Lung cancer. What should I know about heart disease, diabetes, and high blood pressure? Blood pressure and heart disease  High blood pressure causes heart disease and increases the risk of stroke. This is more likely to develop in people who have high blood pressure readings, are of African descent, or are overweight.  Have your blood pressure checked: ? Every 3-5 years if you are 83-74 years of age. ? Every year if you are 96 years old or older. Diabetes Have regular diabetes screenings. This checks your fasting blood sugar level. Have the screening done:  Once every three years after age 11 if you are at a normal weight and have a low risk for diabetes.  More often and at a younger age if you are overweight or have a high risk for diabetes. What should I know about preventing infection? Hepatitis B If you have a higher risk for hepatitis B, you should be screened for this virus. Talk with your health care provider to find out if you are at risk for hepatitis B infection. Hepatitis C Testing is recommended for:  Everyone born from [birth year] through 1963.  Anyone with known risk factors for hepatitis C. Sexually transmitted infections (STIs)  Get screened for STIs, including gonorrhea and chlamydia, if: ? You are sexually active and are younger than 84 years of age. ? You are older than 84 years of age and your health care provider tells you that you are at risk for this type  of infection. ? Your sexual activity has changed since you were last screened, and you are at increased risk for chlamydia or gonorrhea. Ask your health care provider if you are at risk.  Ask your health care provider about whether you are at high risk for HIV. Your health care provider may recommend a prescription medicine to help prevent HIV infection. If you choose to take medicine to prevent HIV, you should first get tested for HIV. You should then be tested every 3  months for as long as you are taking the medicine. Pregnancy  If you are about to stop having your period (premenopausal) and you may become pregnant, seek counseling before you get pregnant.  Take 400 to 800 micrograms (mcg) of folic acid every day if you become pregnant.  Ask for birth control (contraception) if you want to prevent pregnancy. Osteoporosis and menopause Osteoporosis is a disease in which the bones lose minerals and strength with aging. This can result in bone fractures. If you are 48 years old or older, or if you are at risk for osteoporosis and fractures, ask your health care provider if you should:  Be screened for bone loss.  Take a calcium or vitamin D supplement to lower your risk of fractures.  Be given hormone replacement therapy (HRT) to treat symptoms of menopause. Follow these instructions at home: Lifestyle  Do not use any products that contain nicotine or tobacco, such as cigarettes, e-cigarettes, and chewing tobacco. If you need help quitting, ask your health care provider.  Do not use street drugs.  Do not share needles.  Ask your health care provider for help if you need support or information about quitting drugs. Alcohol use  Do not drink alcohol if: ? Your health care provider tells you not to drink. ? You are pregnant, may be pregnant, or are planning to become pregnant.  If you drink alcohol: ? Limit how much you use to 0-1 drink a day. ? Limit intake if you are breastfeeding.  Be aware of how much alcohol is in your drink. In the [place] one drink equals one 12 oz bottle of beer (355 mL), one 5 oz glass of wine (148 mL), or one 1 oz glass of hard liquor (44 mL). General instructions  Schedule regular health, dental, and eye exams.  Stay current with your vaccines.  Tell your health care provider if: ? You often feel depressed. ? You have ever been abused or do not feel safe at home. Summary  Adopting a healthy lifestyle and getting  preventive care are important in promoting health and wellness.  Follow your health care provider's instructions about healthy diet, exercising, and getting tested or screened for diseases.  Follow your health care provider's instructions on monitoring your cholesterol and blood pressure. This information is not intended to replace advice given to you by your health care provider. Make sure you discuss any questions you have with your health care provider. Document Revised: 11/10/2018 Document Reviewed: 11/10/2018 Elsevier Patient Education  2020 [employer].

## 2020-10-12 NOTE — Assessment & Plan Note (Signed)
With flare today, rx prednisone. Continue breo and albuterol prn.

## 2020-10-12 NOTE — Assessment & Plan Note (Signed)
BP at goal on amlodipine and benicar and lasix. Recent CMP without indication for change.

## 2020-10-17 ENCOUNTER — Inpatient Hospital Stay: Payer: Medicare PPO | Attending: Hematology | Admitting: Hematology

## 2020-10-17 ENCOUNTER — Inpatient Hospital Stay: Payer: Medicare PPO

## 2020-10-17 ENCOUNTER — Other Ambulatory Visit: Payer: Self-pay

## 2020-10-17 ENCOUNTER — Telehealth: Payer: Self-pay | Admitting: Hematology

## 2020-10-17 VITALS — BP 171/68 | HR 92 | Temp 97.4°F | Resp 17 | Ht 62.0 in | Wt 180.6 lb

## 2020-10-17 DIAGNOSIS — F419 Anxiety disorder, unspecified: Secondary | ICD-10-CM | POA: Diagnosis not present

## 2020-10-17 DIAGNOSIS — Z90722 Acquired absence of ovaries, bilateral: Secondary | ICD-10-CM | POA: Insufficient documentation

## 2020-10-17 DIAGNOSIS — Z85038 Personal history of other malignant neoplasm of large intestine: Secondary | ICD-10-CM | POA: Insufficient documentation

## 2020-10-17 DIAGNOSIS — C180 Malignant neoplasm of cecum: Secondary | ICD-10-CM

## 2020-10-17 DIAGNOSIS — Z7951 Long term (current) use of inhaled steroids: Secondary | ICD-10-CM | POA: Diagnosis not present

## 2020-10-17 DIAGNOSIS — K219 Gastro-esophageal reflux disease without esophagitis: Secondary | ICD-10-CM | POA: Insufficient documentation

## 2020-10-17 DIAGNOSIS — Z90710 Acquired absence of both cervix and uterus: Secondary | ICD-10-CM | POA: Insufficient documentation

## 2020-10-17 DIAGNOSIS — Z7952 Long term (current) use of systemic steroids: Secondary | ICD-10-CM | POA: Diagnosis not present

## 2020-10-17 DIAGNOSIS — Z79899 Other long term (current) drug therapy: Secondary | ICD-10-CM | POA: Insufficient documentation

## 2020-10-17 DIAGNOSIS — J45909 Unspecified asthma, uncomplicated: Secondary | ICD-10-CM | POA: Insufficient documentation

## 2020-10-17 DIAGNOSIS — Z9079 Acquired absence of other genital organ(s): Secondary | ICD-10-CM | POA: Insufficient documentation

## 2020-10-17 DIAGNOSIS — E785 Hyperlipidemia, unspecified: Secondary | ICD-10-CM | POA: Insufficient documentation

## 2020-10-17 DIAGNOSIS — I10 Essential (primary) hypertension: Secondary | ICD-10-CM | POA: Diagnosis not present

## 2020-10-17 DIAGNOSIS — E876 Hypokalemia: Secondary | ICD-10-CM | POA: Diagnosis not present

## 2020-10-17 DIAGNOSIS — Z86711 Personal history of pulmonary embolism: Secondary | ICD-10-CM | POA: Insufficient documentation

## 2020-10-17 LAB — CBC WITH DIFFERENTIAL (CANCER CENTER ONLY)
Abs Immature Granulocytes: 0.04 10*3/uL (ref 0.00–0.07)
Basophils Absolute: 0 10*3/uL (ref 0.0–0.1)
Basophils Relative: 0 %
Eosinophils Absolute: 0 10*3/uL (ref 0.0–0.5)
Eosinophils Relative: 0 %
HCT: 41.4 % (ref 36.0–46.0)
Hemoglobin: 13.6 g/dL (ref 12.0–15.0)
Immature Granulocytes: 0 %
Lymphocytes Relative: 28 %
Lymphs Abs: 2.8 10*3/uL (ref 0.7–4.0)
MCH: 31.1 pg (ref 26.0–34.0)
MCHC: 32.9 g/dL (ref 30.0–36.0)
MCV: 94.7 fL (ref 80.0–100.0)
Monocytes Absolute: 0.8 10*3/uL (ref 0.1–1.0)
Monocytes Relative: 8 %
Neutro Abs: 6.4 10*3/uL (ref 1.7–7.7)
Neutrophils Relative %: 64 %
Platelet Count: 239 10*3/uL (ref 150–400)
RBC: 4.37 MIL/uL (ref 3.87–5.11)
RDW: 13.2 % (ref 11.5–15.5)
WBC Count: 10.1 10*3/uL (ref 4.0–10.5)
nRBC: 0 % (ref 0.0–0.2)

## 2020-10-17 LAB — CMP (CANCER CENTER ONLY)
ALT: 15 U/L (ref 0–44)
AST: 16 U/L (ref 15–41)
Albumin: 3.8 g/dL (ref 3.5–5.0)
Alkaline Phosphatase: 153 U/L — ABNORMAL HIGH (ref 38–126)
Anion gap: 11 (ref 5–15)
BUN: 15 mg/dL (ref 8–23)
CO2: 22 mmol/L (ref 22–32)
Calcium: 9.3 mg/dL (ref 8.9–10.3)
Chloride: 110 mmol/L (ref 98–111)
Creatinine: 0.86 mg/dL (ref 0.44–1.00)
GFR, Estimated: >60 mL/min (ref >60)
Glucose, Bld: 109 mg/dL — ABNORMAL HIGH (ref 70–99)
Potassium: 3.2 mmol/L — ABNORMAL LOW (ref 3.5–5.1)
Sodium: 143 mmol/L (ref 135–145)
Total Bilirubin: 0.6 mg/dL (ref 0.3–1.2)
Total Protein: 7.8 g/dL (ref 6.5–8.1)

## 2020-10-17 LAB — CEA (IN HOUSE-CHCC): CEA (CHCC-In House): 2.68 ng/mL (ref 0.00–5.00)

## 2020-10-17 MED ORDER — POTASSIUM CHLORIDE ER 10 MEQ PO TBCR: 10.0000 meq | EXTENDED_RELEASE_TABLET | Freq: Every day | ORAL | 0 refills | Status: DC

## 2020-10-17 NOTE — Telephone Encounter (Signed)
Scheduled per 11/17 los. Printed avs and calendar for pt °

## 2020-10-18 ENCOUNTER — Encounter: Payer: Self-pay | Admitting: Hematology

## 2020-10-29 ENCOUNTER — Other Ambulatory Visit: Payer: Self-pay | Admitting: Surgery

## 2020-10-29 DIAGNOSIS — C189 Malignant neoplasm of colon, unspecified: Secondary | ICD-10-CM

## 2020-10-30 ENCOUNTER — Telehealth: Payer: Self-pay

## 2020-10-30 NOTE — Telephone Encounter (Addendum)
-  Left message on patient voicemail   ---- Message from Hoyt Koch, MD sent at 10/19/2020 10:31 AM EST ----- Can you call patient and ask her to stop lasix and call us if she gets any leg swelling? Thanks Kathlee Nations

## 2020-11-05 ENCOUNTER — Other Ambulatory Visit: Payer: Self-pay | Admitting: Internal Medicine

## 2020-11-09 ENCOUNTER — Other Ambulatory Visit: Payer: Self-pay | Admitting: Hematology

## 2020-11-11 ENCOUNTER — Other Ambulatory Visit: Payer: Self-pay | Admitting: Internal Medicine

## 2020-11-11 DIAGNOSIS — E782 Mixed hyperlipidemia: Secondary | ICD-10-CM

## 2020-11-15 ENCOUNTER — Ambulatory Visit
Admission: RE | Admit: 2020-11-15 | Discharge: 2020-11-15 | Disposition: A | Discharge status: Home / Self Care | Payer: Medicare PPO | Source: Ambulatory Visit | Attending: Surgery | Admitting: Surgery

## 2020-11-15 ENCOUNTER — Other Ambulatory Visit: Payer: Self-pay

## 2020-11-15 DIAGNOSIS — I2510 Atherosclerotic heart disease of native coronary artery without angina pectoris: Secondary | ICD-10-CM | POA: Diagnosis not present

## 2020-11-15 DIAGNOSIS — N281 Cyst of kidney, acquired: Secondary | ICD-10-CM | POA: Diagnosis not present

## 2020-11-15 DIAGNOSIS — K5730 Diverticulosis of large intestine without perforation or abscess without bleeding: Secondary | ICD-10-CM | POA: Diagnosis not present

## 2020-11-15 DIAGNOSIS — C189 Malignant neoplasm of colon, unspecified: Secondary | ICD-10-CM

## 2020-11-15 DIAGNOSIS — K7689 Other specified diseases of liver: Secondary | ICD-10-CM | POA: Diagnosis not present

## 2020-11-15 DIAGNOSIS — I700 Atherosclerosis of aorta: Secondary | ICD-10-CM | POA: Diagnosis not present

## 2020-11-15 DIAGNOSIS — Z90710 Acquired absence of both cervix and uterus: Secondary | ICD-10-CM | POA: Diagnosis not present

## 2020-11-15 MED ORDER — IOPAMIDOL (ISOVUE-300) INJECTION 61%
100.0000 mL | Freq: Once | INTRAVENOUS | Status: AC | PRN
  Administered 2020-11-15: 11:00:00 100 mL via INTRAVENOUS

## 2020-12-18 ENCOUNTER — Other Ambulatory Visit: Payer: Self-pay | Admitting: Internal Medicine

## 2020-12-20 IMAGING — CT CT CHEST W/ CM
2 of 3 series · 15 of 36 positions shown, 18 images · IV contrast (OMNIPAQUE 300)
Comparison: Abdomen/pelvis CT 09/26/2019.

CLINICAL DATA: Cecal mass.  Staging.

EXAM:
CT CHEST WITH CONTRAST
TECHNIQUE: Multidetector CT imaging of the chest was performed during
intravenous contrast administration.
CONTRAST:  80mL OMNIPAQUE IOHEXOL 300 MG/ML  SOLN

[Series 2: thorax · axial · 0.66mm/px · z∈[-325,-77]mm · 12 of 146 slices shown, 15 images]
[im 11/146  mediastinal]
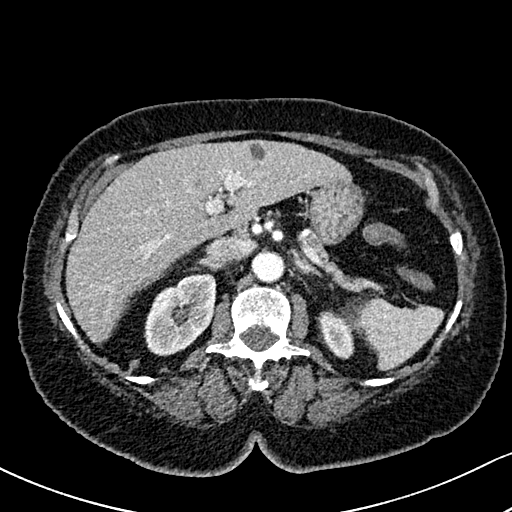
[im 11/146  lung]
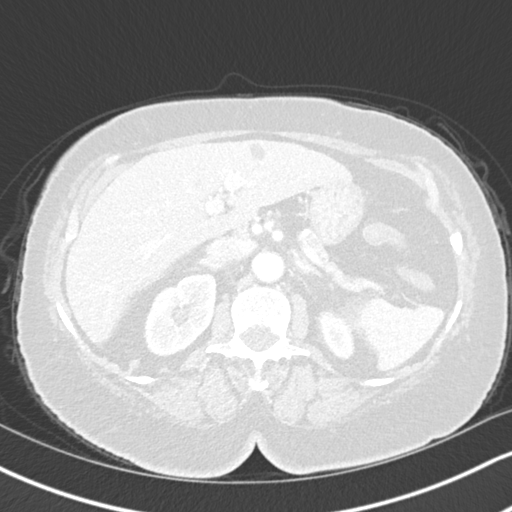
[im 22/146  lung]
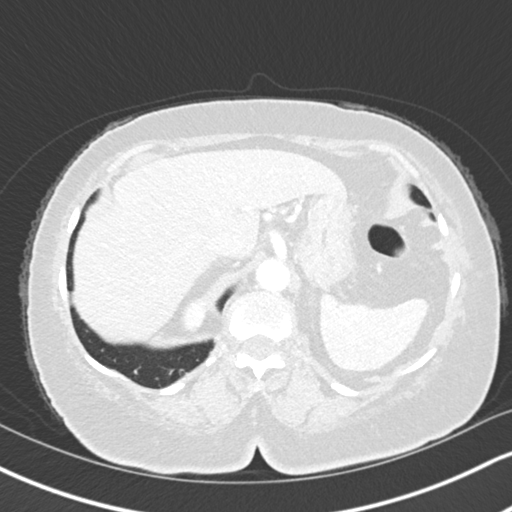
[im 33/146  lung]
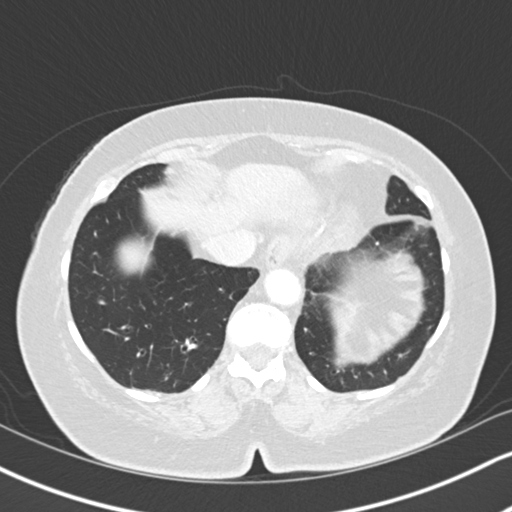
[im 43/146  lung]
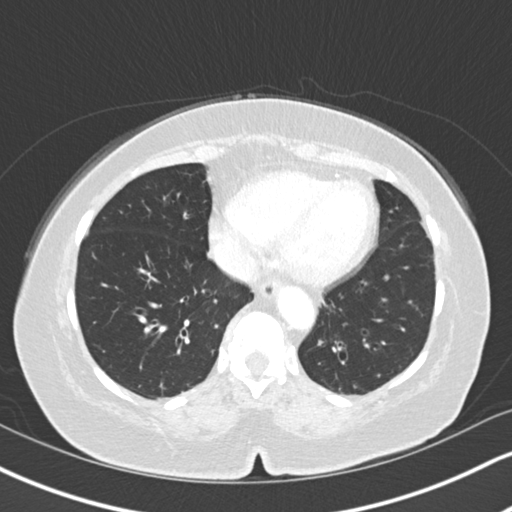
[im 54/146  mediastinal]
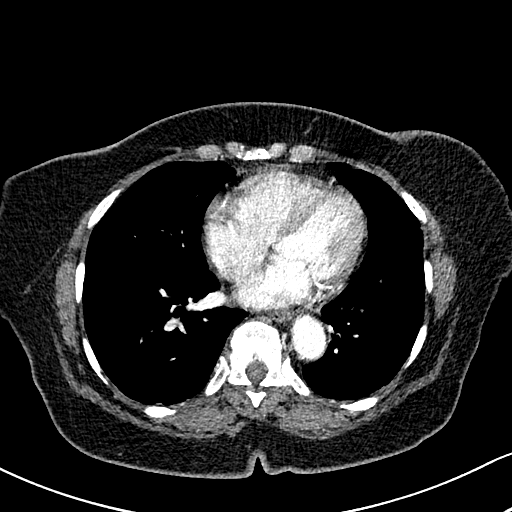
[im 54/146  lung]
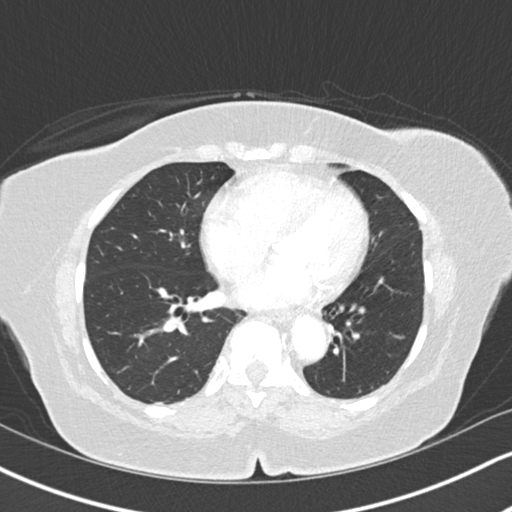
[im 65/146  lung]
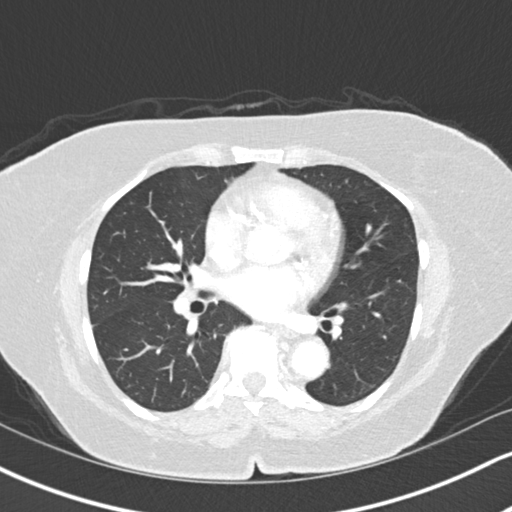
[im 81/146  lung]
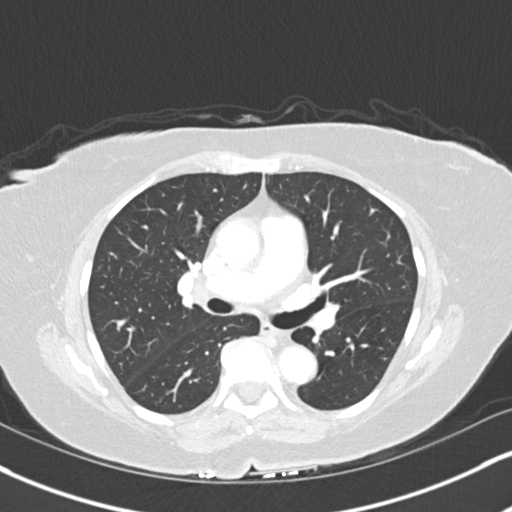
[im 92/146  lung]
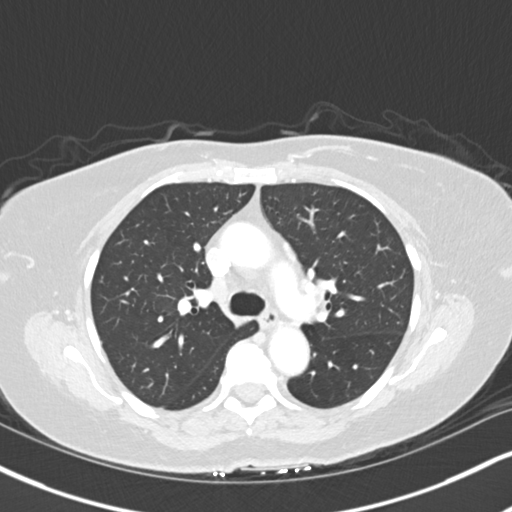
[im 103/146  mediastinal]
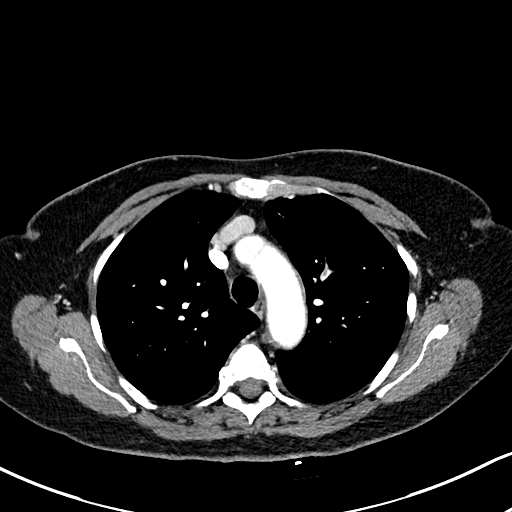
[im 103/146  lung]
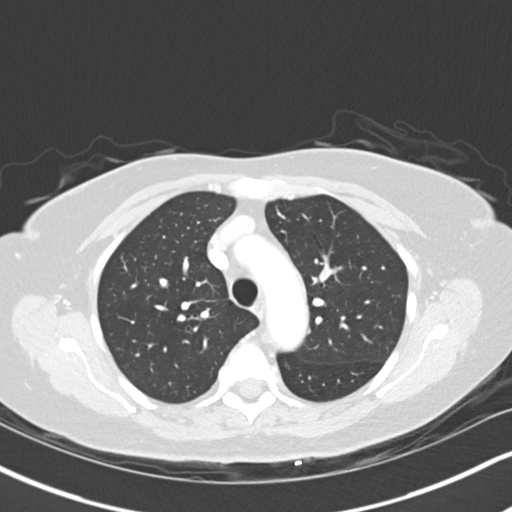
[im 113/146  lung]
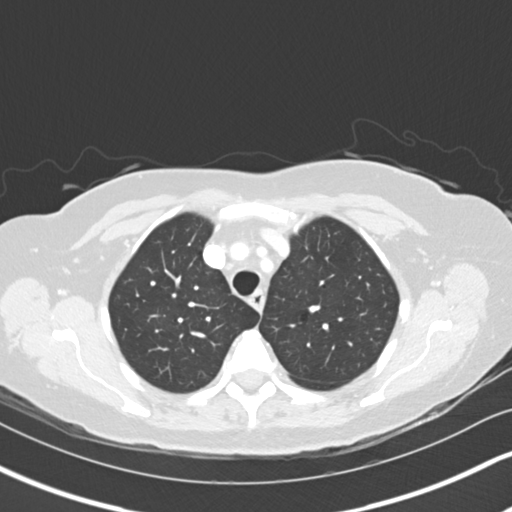
[im 124/146  lung]
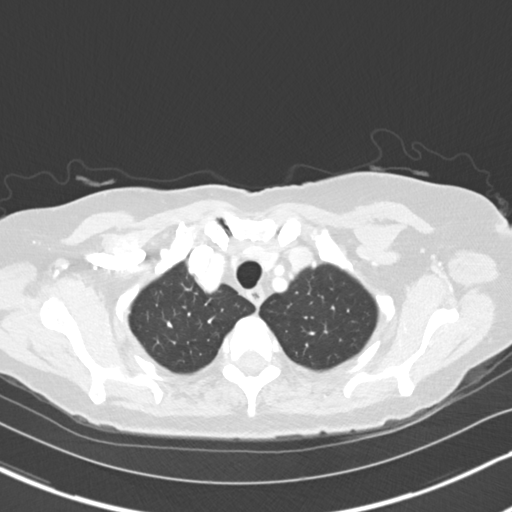
[im 135/146  lung]
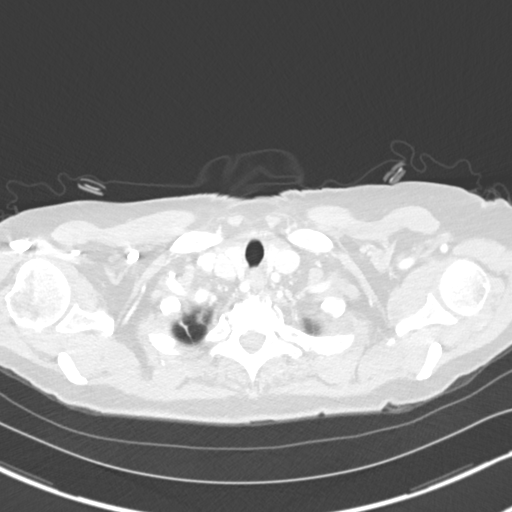

[Series 5: coronal · coronal · 0.59mm/px · 3 of 109 slices shown]
[im 22/109  lung]
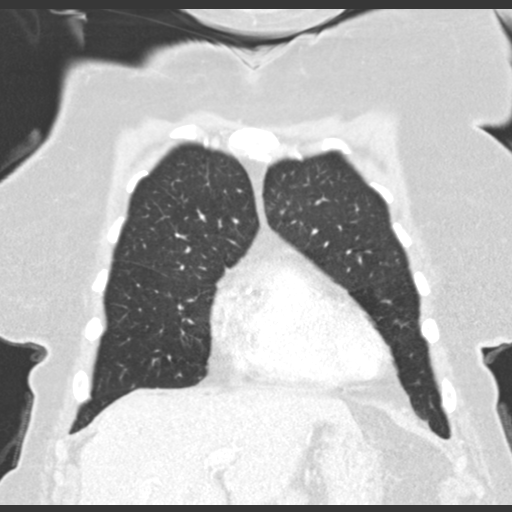
[im 44/109  lung]
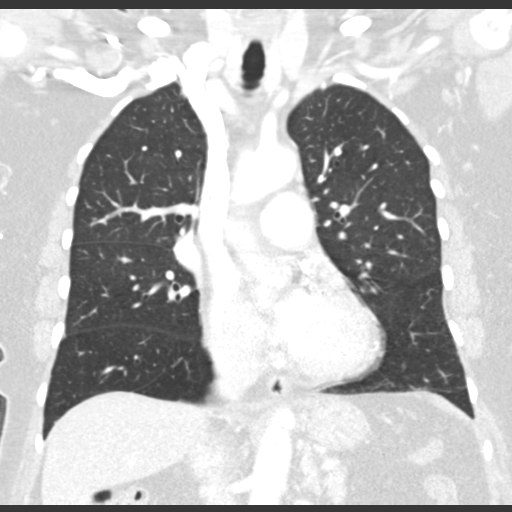
[im 65/109  lung]
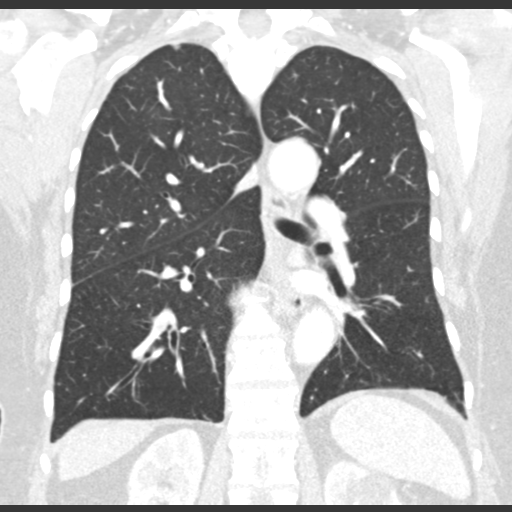

[15 of 36 positions shown; findings below may reference images not displayed]

FINDINGS: Cardiovascular: The heart size is normal. No substantial pericardial
effusion. Coronary artery calcification is evident. Atherosclerotic
calcification is noted in the wall of the thoracic aorta.
Nonocclusive filling defect identified in segmental pulmonary artery
to the left upper lobe. String like nonocclusive embolus identified
in segmental and subsegmental branches to the left lower lobe. There
may be trace nonobstructive embolus in subsegmental artery to the
right lower lobe (94/2).

Mediastinum/Nodes: No mediastinal lymphadenopathy. There is no hilar
lymphadenopathy. The esophagus has normal imaging features. There is
no axillary lymphadenopathy.

Lungs/Pleura: No suspicious pulmonary nodule or mass. Tiny probable
granuloma in the left lower lobe on 80/3. No focal airspace
consolidation. No pleural effusion.

Upper Abdomen: Hepatic cysts again noted.  Renal cysts evident.

Musculoskeletal: No worrisome lytic or sclerotic osseous
abnormality.
IMPRESSION: 1. Small volume nonocclusive pulmonary embolus identified in
segmental left upper lobe pulmonary artery and subsegmental
pulmonary artery to the left lower lobe. Left lower lobe embolus has
a string like configuration suggesting that these findings may well
be subacute to chronic. Probable trace nonobstructive embolus in a
subsegmental artery to the right lower lobe.
2. No evidence for metastatic disease in the thorax.
3.  Aortic Atherosclerois (6YMEC-170.0)

These results will be called to the ordering clinician or
representative by the Radiologist Assistant, and communication
documented in the PACS or zVision Dashboard.

## 2020-12-25 DIAGNOSIS — C189 Malignant neoplasm of colon, unspecified: Secondary | ICD-10-CM | POA: Diagnosis not present

## 2021-02-08 ENCOUNTER — Other Ambulatory Visit: Payer: Self-pay

## 2021-02-08 DIAGNOSIS — C180 Malignant neoplasm of cecum: Secondary | ICD-10-CM

## 2021-02-11 ENCOUNTER — Telehealth: Payer: Self-pay

## 2021-02-11 ENCOUNTER — Encounter: Payer: Self-pay | Admitting: Internal Medicine

## 2021-02-11 ENCOUNTER — Ambulatory Visit: Payer: Medicare PPO | Admitting: Internal Medicine

## 2021-02-11 VITALS — BP 166/64 | HR 85 | Ht 62.0 in | Wt 182.0 lb

## 2021-02-11 DIAGNOSIS — Z85038 Personal history of other malignant neoplasm of large intestine: Secondary | ICD-10-CM

## 2021-02-11 NOTE — Telephone Encounter (Signed)
I left her a voicemail message on her mobile # that Dr Burr Medico and Dr Carlean Purl talked and decided her CT for 02/13/2021 could be cancelled. And that she will be getting her labs before her office visit on 02/15/21 and that Dr Ernestina Penna RN will be calling her if she hasn't already.  I was able to reach her home # and relayed the message.

## 2021-02-11 NOTE — Progress Notes (Signed)
Jamie [name] 87 y.o. 05-19-1933 [id number]  Assessment & Plan:   Encounter Diagnosis  Name Primary?  . Personal history of colon cancer Yes    She is very elderly but she has not had a follow-up colonoscopy since her resection in 2020, I think it is very reasonable to repeat a colonoscopy now given her functional status.  We will do so.  We communicated with Dr. Burr Medico and canceled her CT scan for later this week as she had one in December.  The risks and benefits as well as alternatives of endoscopic procedure(s) have been discussed and reviewed. All questions answered. The patient agrees to proceed.  I appreciate the opportunity to care for this patient. Copies will be sent to Dr. Nadeen [name] Subjective:   Chief Complaint: History of colon cancer needs surveillance colonoscopy  HPI The patient is a 87 year old African-American woman diagnosed with adenocarcinoma of the cecum in November 2020, status post right hemicolectomy by Dr. Dema Severin and followed by Dr. Truitt Merle of oncology.  She has had CT scanning of the chest abdomen pelvis most recently in December of last year negative for any signs of metastases.  Functional status remains good she does all of her ADLs she does use a cane to walk.  She had a T3N0 tumor and is in surveillance.  No GI symptoms today. Allergies  Allergen Reactions  . Aspirin Other (See Comments)    Stomach upset  . Latex Itching and Rash   Current Meds  Medication Sig  . acetaminophen (TYLENOL) 500 MG tablet Take 500-1,000 mg by mouth every 6 (six) hours as needed (for pain.).   Marland Kitchen albuterol (VENTOLIN HFA) 108 (90 Base) MCG/ACT inhaler INHALE 2 PUFFS INTO THE LUNGS EVERY 4 (FOUR) HOURS AS NEEDED FOR WHEEZING OR SHORTNESS OF BREATH.  Marland Kitchen ALPRAZolam (XANAX) 0.5 MG tablet TAKE 1 TABLET BY MOUTH EVERY DAY AS NEEDED FOR ANXIETY  . alum & mag hydroxide-simeth (MAALOX/MYLANTA) 200-200-20 MG/5ML suspension Take 15 mLs by mouth every 6 (six) hours as  needed for indigestion or heartburn.  Marland Kitchen amLODipine (NORVASC) 5 MG tablet TAKE 1 TABLET BY MOUTH EVERY DAY  . atorvastatin (LIPITOR) 20 MG tablet TAKE 1 TABLET DAILY AT 6 PM. ANNUAL APPT DUE IN OCT FOR FUTURE REFILLS  . BREO ELLIPTA 100-25 MCG/INH AEPB INHALE 1 PUFF BY MOUTH EVERY DAY  . famotidine (PEPCID) 20 MG tablet TAKE 1 TABLET BY MOUTH TWICE A DAY  . furosemide (LASIX) 40 MG tablet TAKE 1 TABLET BY MOUTH EVERY DAY  . olmesartan (BENICAR) 40 MG tablet TAKE 1 TABLET BY MOUTH EVERY DAY  . ondansetron (ZOFRAN-ODT) 4 MG disintegrating tablet TAKE 1 TABLET BY MOUTH EVERY 8 HOURS AS NEEDED FOR NAUSEA AND VOMITING  . sennosides-docusate sodium (SENOKOT-S) 8.6-50 MG tablet Take 1 tablet by mouth daily as needed for constipation.  . triamcinolone (KENALOG) 0.1 % Apply 1 application topically 2 (two) times daily as needed (skin irritation/rash).   Past Medical History:  Diagnosis Date  . Adenocarcinoma of cecum ([place]) 09/28/2019  . ANEMIA, IRON DEFICIENCY 09/10/2007  . Anxiety   . Arthritis   . Asthma   . Blood transfusion without reported diagnosis   . Cataract 2008   bilateral cataract extraction  . CATARACT EXTRACTION, HX OF 09/10/2007  . DYSPEPSIA, HX OF 09/10/2007  . GERD (gastroesophageal reflux disease)   . HAIR LOSS 03/21/2009  . HAY FEVER 09/10/2007  . HEARING LOSS, SENSORINEURAL 09/10/2007  . HYPERLIPIDEMIA 09/10/2007  . HYPERTENSION 09/10/2007  .  LOW BACK PAIN 05/10/2008  . Pain in joint, hand 03/01/2008  . PEPTIC ULCER DISEASE 09/10/2007  . POLYP, COLON 09/10/2007  . POLYPECTOMY, HX OF 09/10/2007  . Pulmonary embolism ([place]) 1- 2 non-occlusive 10/10/2019  . SHINGLES 08/13/2009  . SHOULDER PAIN, LEFT 02/12/2008  . TAH/BSO, HX OF 09/10/2007  . Tuberculosis    PATIENT DENIES AT PHONE INTERVIEW ON 12/12/2019   Past Surgical History:  Procedure Laterality Date  . ABDOMINAL HYSTERECTOMY    . BREAST SURGERY     breast biopsy-benign  . CATARACT EXTRACTION    . COLONOSCOPY    .  EYE SURGERY     bilateral cataract with lens implant  . IRRIGATION AND DEBRIDEMENT SEBACEOUS CYST    . LAPAROSCOPIC RIGHT HEMI COLECTOMY Right 12/15/2019   Procedure: LAPAROSCOPIC RIGHT HEMI COLECTOMY WITH LYSIS OF ADHESIONS;  Surgeon: Ileana Roup, MD;  Location: [facility];  Service: General;  Laterality: Right;  . TOTAL ABDOMINAL HYSTERECTOMY W/ BILATERAL SALPINGOOPHORECTOMY     Social History   Social History Narrative   3 sons, 73,59, 59 and 1 daughter 52, 4 grandchildren   SO with multiple medical problems   family history includes Cancer (age of onset: 9) in her son and son; Coronary artery disease in her mother; Heart disease in her mother; Hypertension in her mother.   Review of Systems As per HPI  Objective:   Physical Exam BP (!) 166/64   Pulse 85   Ht 5\' 2"  (1.575 m)   Wt 182 lb (82.6 kg)   BMI 33.29 kg/m  NAD elderly obese bw Lungs cta  Cor NL s1s2 no rmg abd obese, midline scar, soft Nt and no hernia Rectal deferred Alert and oriented x 3  Total time with the chart and the patient and documentation 35 minutes

## 2021-02-11 NOTE — Patient Instructions (Signed)
You have been scheduled for a colonoscopy. Please follow written instructions given to you at your visit today.  Please pick up your prep supplies at the pharmacy within the next 1-3 days. If you use inhalers (even only as needed), please bring them with you on the day of your procedure.   Due to recent changes in healthcare laws, you may see the results of your imaging and laboratory studies on MyChart before your provider has had a chance to review them.  We understand that in some cases there may be results that are confusing or concerning to you. Not all laboratory results come back in the same time frame and the provider may be waiting for multiple results in order to interpret others.  Please give us 48 hours in order for your provider to thoroughly review all the results before contacting the office for clarification of your results.    I appreciate the opportunity to care for you. Carl Gessner, MD, FACG 

## 2021-02-12 ENCOUNTER — Inpatient Hospital Stay: Payer: Medicare PPO

## 2021-02-13 ENCOUNTER — Inpatient Hospital Stay: Payer: Medicare PPO

## 2021-02-13 ENCOUNTER — Ambulatory Visit (HOSPITAL_COMMUNITY): Payer: Medicare PPO

## 2021-02-13 NOTE — Progress Notes (Signed)
Jamie [name]   [phone] [phone]   Clinic Follow up Note   Patient Care Team: Myrlene Broker, MD as PCP - General (Internal Medicine) Tedd Sias, MD as Referring Physician (Optometry) Cliffton Asters, Stephanie Coup, MD as Consulting Physician (General Surgery) Malachy Mood, MD as Consulting Physician (Hematology) Iva Boop, MD as Consulting Physician (Gastroenterology)  Date of Service:  02/15/2021  CHIEF COMPLAINT: F/u of right colon cancer  SUMMARY OF ONCOLOGIC HISTORY: Oncology History Overview Note  Cancer Staging Adenocarcinoma of cecum [facility]) Staging form: Colon and Rectum, AJCC 8th Edition - Clinical: Stage Unknown (cTX, cN0, cM0) - Signed by Malachy Mood, MD on 10/17/2019 - Pathologic stage from 12/15/2019: Stage IIA (pT3, pN0, cM0) - Signed by Malachy Mood, MD on 01/14/2020    Adenocarcinoma of cecum ([facility])  09/26/2019 Imaging   CT AP W Contrast  IMPRESSION: Large cecal mass is noted concerning for malignancy. Colonoscopy is recommended for further evaluation. These results will be called to the ordering clinician or representative by the Radiologist Assistant, and communication documented in the PACS or zVision Dashboard.   Aortic Atherosclerosis (ICD10-I70.0).   09/28/2019 Initial Diagnosis   Adenocarcinoma of cecum (HCC)   10/04/2019 Pathology Results   Cecal mass biopsy showed adenocarcinoma   10/04/2019 Procedure   -Malignant tumor in the cecum. Biopsied. - Diverticulosis in the sigmoid colon. - The examination was otherwise normal   10/04/2019 Miscellaneous   MMR showed loss of MLH1 and PMS2 expression    10/10/2019 Imaging   CT chest W contrast  IMPRESSION: 1. Small volume nonocclusive pulmonary embolus identified in segmental left upper lobe pulmonary artery and subsegmental pulmonary artery to the left lower lobe. Left lower lobe embolus has a string like configuration suggesting that these findings may well be  subacute to chronic. Probable trace nonobstructive embolus in a subsegmental artery to the right lower lobe. 2. No evidence for metastatic disease in the thorax. 3.  Aortic Atherosclerois (ICD10-170.0)   10/17/2019 Cancer Staging   Staging form: Colon and Rectum, AJCC 8th Edition - Clinical: Stage Unknown (cTX, cN0, cM0) - Signed by Malachy Mood, MD on 10/17/2019   12/15/2019 Surgery   LAPAROSCOPIC RIGHT HEMI COLECTOMY WITH LYSIS OF ADHESIONS by Dr. Cliffton Asters     12/15/2019 Pathology Results   FINAL MICROSCOPIC DIAGNOSIS:   A. COLON, RIGHT, RESECTION:  -  Adenocarcinoma, moderately differentiated, 7 cm  -  Carcinoma invades through muscularis propria into pericolorectal  tissue  -  No carcinoma identified in nineteen (0/19)  -  Margins uninvolved by carcinoma  -  Appendix uninvolved by carcinoma  -  Tubular adenoma, incidental (0.3 cm)  -  See oncology table and comment below    12/15/2019 Cancer Staging   Staging form: Colon and Rectum, AJCC 8th Edition - Pathologic stage from 12/15/2019: Stage IIA (pT3, pN0, cM0) - Signed by Malachy Mood, MD on 01/14/2020   11/15/2020 Imaging   CT CAP  IMPRESSION: No evidence of recurrent or metastatic carcinoma within the chest, abdomen, or pelvis.   Colonic diverticulosis. No radiographic evidence of diverticulitis.   Aortic and coronary atherosclerotic calcification.      CURRENT THERAPY:  Surveillance  INTERVAL HISTORY:  Jamie [name] is here for a follow up of colon cancer. She was last seen by me 4 months ago. She presents to the clinic alone. She notes she is doing well. She saw Dr. Cliffton Asters 3 months ago and had scan at that time. She denies any abdominal issues  or pain. She notes her appetite is doing well and has adequate BMs. She plans to have colonoscopy 03/18/21. She denies heartburn or nausea. She notes she cannot walk like before as she get tired faster. She also has arthritis in her knee so she used wheelchair today. She notes her  increased fatigue has occurred for the past 3 months. She plans to see her PCP 3/31.    REVIEW OF SYSTEMS:   Constitutional: Denies fevers, chills or abnormal weight loss (+) Fatigue  Eyes: Denies blurriness of vision Ears, nose, mouth, throat, and face: Denies mucositis or sore throat Respiratory: Denies cough, dyspnea or wheezes Cardiovascular: Denies palpitation, chest discomfort or lower extremity swelling Gastrointestinal:  Denies nausea, heartburn or change in bowel habits Skin: Denies abnormal skin rashes Lymphatics: Denies new lymphadenopathy or easy bruising Neurological:Denies numbness, tingling or new weaknesses Behavioral/Psych: Mood is stable, no new changes  All other systems were reviewed with the patient and are negative.  MEDICAL HISTORY:  Past Medical History:  Diagnosis Date  . Adenocarcinoma of cecum ([place]) 09/28/2019  . ANEMIA, IRON DEFICIENCY 09/10/2007  . Anxiety   . Arthritis   . Asthma   . Blood transfusion without reported diagnosis   . Cataract 2008   bilateral cataract extraction  . CATARACT EXTRACTION, HX OF 09/10/2007  . DYSPEPSIA, HX OF 09/10/2007  . GERD (gastroesophageal reflux disease)   . HAIR LOSS 03/21/2009  . HAY FEVER 09/10/2007  . HEARING LOSS, SENSORINEURAL 09/10/2007  . HYPERLIPIDEMIA 09/10/2007  . HYPERTENSION 09/10/2007  . LOW BACK PAIN 05/10/2008  . Pain in joint, hand 03/01/2008  . PEPTIC ULCER DISEASE 09/10/2007  . POLYP, COLON 09/10/2007  . POLYPECTOMY, HX OF 09/10/2007  . Pulmonary embolism ([place]) 1- 2 non-occlusive 10/10/2019  . SHINGLES 08/13/2009  . SHOULDER PAIN, LEFT 02/12/2008  . TAH/BSO, HX OF 09/10/2007  . Tuberculosis    PATIENT DENIES AT PHONE INTERVIEW ON 12/12/2019    SURGICAL HISTORY: Past Surgical History:  Procedure Laterality Date  . ABDOMINAL HYSTERECTOMY    . BREAST SURGERY     breast biopsy-benign  . CATARACT EXTRACTION    . COLONOSCOPY    . EYE SURGERY     bilateral cataract with lens implant  .  IRRIGATION AND DEBRIDEMENT SEBACEOUS CYST    . LAPAROSCOPIC RIGHT HEMI COLECTOMY Right 12/15/2019   Procedure: LAPAROSCOPIC RIGHT HEMI COLECTOMY WITH LYSIS OF ADHESIONS;  Surgeon: Ileana Roup, MD;  Location: [facility];  Service: General;  Laterality: Right;  . TOTAL ABDOMINAL HYSTERECTOMY W/ BILATERAL SALPINGOOPHORECTOMY      I have reviewed the social history and family history with the patient and they are unchanged from previous note.  ALLERGIES:  is allergic to aspirin and latex.  MEDICATIONS:  Current Outpatient Medications  Medication Sig Dispense Refill  . acetaminophen (TYLENOL) 500 MG tablet Take 500-1,000 mg by mouth every 6 (six) hours as needed (for pain.).     Marland Kitchen albuterol (VENTOLIN HFA) 108 (90 Base) MCG/ACT inhaler INHALE 2 PUFFS INTO THE LUNGS EVERY 4 (FOUR) HOURS AS NEEDED FOR WHEEZING OR SHORTNESS OF BREATH. 25.5 each 2  . ALPRAZolam (XANAX) 0.5 MG tablet TAKE 1 TABLET BY MOUTH EVERY DAY AS NEEDED FOR ANXIETY 20 tablet 2  . alum & mag hydroxide-simeth (MAALOX/MYLANTA) 200-200-20 MG/5ML suspension Take 15 mLs by mouth every 6 (six) hours as needed for indigestion or heartburn.    Marland Kitchen amLODipine (NORVASC) 5 MG tablet TAKE 1 TABLET BY MOUTH EVERY DAY 90 tablet 1  . atorvastatin (  LIPITOR) 20 MG tablet TAKE 1 TABLET DAILY AT 6 PM. ANNUAL APPT DUE IN OCT FOR FUTURE REFILLS 90 tablet 0  . BREO ELLIPTA 100-25 MCG/INH AEPB INHALE 1 PUFF BY MOUTH EVERY DAY 60 each 3  . famotidine (PEPCID) 20 MG tablet TAKE 1 TABLET BY MOUTH TWICE A DAY 180 tablet 1  . furosemide (LASIX) 40 MG tablet TAKE 1 TABLET BY MOUTH EVERY DAY 90 tablet 1  . olmesartan (BENICAR) 40 MG tablet TAKE 1 TABLET BY MOUTH EVERY DAY 90 tablet 3  . ondansetron (ZOFRAN-ODT) 4 MG disintegrating tablet TAKE 1 TABLET BY MOUTH EVERY 8 HOURS AS NEEDED FOR NAUSEA AND VOMITING 60 tablet 0  . sennosides-docusate sodium (SENOKOT-S) 8.6-50 MG tablet Take 1 tablet by mouth daily as needed for constipation.    . triamcinolone  (KENALOG) 0.1 % Apply 1 application topically 2 (two) times daily as needed (skin irritation/rash). 90 g 0   No current facility-administered medications for this visit.    PHYSICAL EXAMINATION: ECOG PERFORMANCE STATUS: 1 - Symptomatic but completely ambulatory  Vitals:   02/15/21 0913  BP: (!) 142/60  Pulse: 86  Resp: 18  Temp: 98.2 F (36.8 C)  SpO2: 100%   Filed Weights   02/15/21 0913  Weight: 183 lb 4.8 oz (83.1 kg)    GENERAL:alert, no distress and comfortable SKIN: skin color, texture, turgor are normal, no rashes or significant lesions EYES: normal, Conjunctiva are pink and non-injected, sclera clear  NECK: supple, thyroid normal size, non-tender, without nodularity LYMPH:  no palpable lymphadenopathy in the cervical, axillary  LUNGS: clear to auscultation and percussion (+) Mild b/l rales  HEART: regular rate & rhythm and no murmurs and no lower extremity edema ABDOMEN:abdomen soft, non-tender and normal bowel sounds Musculoskeletal:no cyanosis of digits and no clubbing  NEURO: alert & oriented x 3 with fluent speech, no focal motor/sensory deficits EXAM PERFORMED IN WHEELCHAIR TODAY   LABORATORY DATA:  I have reviewed the data as listed CBC Latest Ref Rng & Units 02/15/2021 10/17/2020 04/13/2020  WBC 4.0 - 10.5 K/uL 5.8 10.1 4.9  Hemoglobin 12.0 - 15.0 g/dL 12.4 13.6 13.1  Hematocrit 36.0 - 46.0 % 37.3 41.4 39.9  Platelets 150 - 400 K/uL 188 239 205     CMP Latest Ref Rng & Units 02/15/2021 10/17/2020 04/13/2020  Glucose 70 - 99 mg/dL 126(H) 109(H) 105(H)  BUN 8 - 23 mg/dL $Remove'9 15 11  'HhYChsQ$ Creatinine 0.44 - 1.00 mg/dL 0.86 0.86 0.90  Sodium 135 - 145 mmol/L 139 143 142  Potassium 3.5 - 5.1 mmol/L 3.7 3.2(L) 3.7  Chloride 98 - 111 mmol/L 111 110 108  CO2 22 - 32 mmol/L $RemoveB'22 22 24  'sveJOPGe$ Calcium 8.9 - 10.3 mg/dL 9.2 9.3 9.3  Total Protein 6.5 - 8.1 g/dL 7.7 7.8 8.1  Total Bilirubin 0.3 - 1.2 mg/dL 0.6 0.6 0.5  Alkaline Phos 38 - 126 U/L 176(H) 153(H) 170(H)  AST 15 - 41  U/L $Re'16 16 17  'krx$ ALT 0 - 44 U/L $Remo'14 15 14      'TqwIw$ RADIOGRAPHIC STUDIES: I have personally reviewed the radiological images as listed and agreed with the findings in the report. No results found.   ASSESSMENT & PLAN:  Jamie [name] is a 87 y.o. female with    1. Right colon cancer,pT3N0M0,stage IIA,MSI-H -She was diagnosed in 10/2019.She has biopsy-proven cecal adenocarcinoma,stagingCT scan was negative for distant metastasis. -She underwent curative right hemicolectomy with Dr. Dema Severin on 12/15/19.Herpath showedcomplete surgical resection of 7cm tumor and  negative 19 LNs were removed.  -HerMMRresultsshow her cancer hasMLH1 and PMS2 loss, and MLH hypermethylation positive, supports sporadic cancer, not Lynch syndrome. -Given her early stage cancer with completesurgical resection,I did not recommend adjuvant chemotherapy. She is currently on surveillance.  -From a colon cancer standpoint, she is clinically doing well. Her 10/2020 CT CAP was negative for recurrence. I reviewed with pt. Labs reviewed, CBC and CMP WNL. Physical exam benign. There is no clinical concern for recurrence.  -She is 1 year since her cancer diagnosis. Will continue 5 year surveillance plan.  -she is due for colonoscopy, she saw Dr. Carlean Purl last week and plan to do in next few months -f/u in 4-5 months. Will order scan at that time.   2. Fatigue -She notes in the past 3 months having increased fatigue and not being able to walk for as long.  -I discussed this could be normal aging process. She plans to f/u with PCP on 3/31. I recommend she check her Thyroid level. She also has asthma, on inhaler.  -She has mild b/l rales on exam today (02/15/21). I discussed this could be from her lungs not fully opening or from inflammation. I recommend she practice deep breathing and remain active at home.   3. LUL pulmonary embolism  -Seen on 10/2019 CTchest,she was asymptomatic. Lower extremely Doppler was  negative for DVT -Shewas treated withEliquis 2.5mg  for 4-5 months, off now  4.  HTN, GERD, Arthritis  -Continue to f/u with PCP, BP not well controlled  -She has arthritis mainly in knees and hands -She ambulates with walker and cane.   5. Hypokalemia  -If she continues Lasix she may need to continue oral potassium.    PLAN: -Lab and f/u in 4.5 months, will order CT scan on next visit.    No problem-specific Assessment & Plan notes found for this encounter.   No orders of the defined types were placed in this encounter.  All questions were answered. The patient knows to call the clinic with any problems, questions or concerns. No barriers to learning was detected. The total time spent in the appointment was 25 minutes.     Truitt Merle, MD 02/15/2021   I, Joslyn Devon, am acting as scribe for Truitt Merle, MD.   I have reviewed the above documentation for accuracy and completeness, and I agree with the above.

## 2021-02-14 ENCOUNTER — Inpatient Hospital Stay: Payer: Medicare PPO | Admitting: Hematology

## 2021-02-15 ENCOUNTER — Telehealth: Payer: Self-pay | Admitting: Hematology

## 2021-02-15 ENCOUNTER — Other Ambulatory Visit: Payer: Self-pay

## 2021-02-15 ENCOUNTER — Encounter: Payer: Self-pay | Admitting: Hematology

## 2021-02-15 ENCOUNTER — Inpatient Hospital Stay: Payer: Medicare PPO

## 2021-02-15 ENCOUNTER — Inpatient Hospital Stay: Payer: Medicare PPO | Attending: Hematology | Admitting: Hematology

## 2021-02-15 VITALS — BP 142/60 | HR 86 | Temp 98.2°F | Resp 18 | Ht 62.0 in | Wt 183.3 lb

## 2021-02-15 DIAGNOSIS — Z85038 Personal history of other malignant neoplasm of large intestine: Secondary | ICD-10-CM | POA: Insufficient documentation

## 2021-02-15 DIAGNOSIS — Z90710 Acquired absence of both cervix and uterus: Secondary | ICD-10-CM | POA: Insufficient documentation

## 2021-02-15 DIAGNOSIS — C180 Malignant neoplasm of cecum: Secondary | ICD-10-CM

## 2021-02-15 DIAGNOSIS — Z90722 Acquired absence of ovaries, bilateral: Secondary | ICD-10-CM | POA: Diagnosis not present

## 2021-02-15 DIAGNOSIS — Z7951 Long term (current) use of inhaled steroids: Secondary | ICD-10-CM | POA: Diagnosis not present

## 2021-02-15 DIAGNOSIS — Z86711 Personal history of pulmonary embolism: Secondary | ICD-10-CM | POA: Insufficient documentation

## 2021-02-15 DIAGNOSIS — E876 Hypokalemia: Secondary | ICD-10-CM | POA: Insufficient documentation

## 2021-02-15 DIAGNOSIS — Z79899 Other long term (current) drug therapy: Secondary | ICD-10-CM | POA: Diagnosis not present

## 2021-02-15 DIAGNOSIS — I10 Essential (primary) hypertension: Secondary | ICD-10-CM | POA: Insufficient documentation

## 2021-02-15 DIAGNOSIS — K219 Gastro-esophageal reflux disease without esophagitis: Secondary | ICD-10-CM | POA: Diagnosis not present

## 2021-02-15 DIAGNOSIS — Z9079 Acquired absence of other genital organ(s): Secondary | ICD-10-CM | POA: Diagnosis not present

## 2021-02-15 LAB — CBC WITH DIFFERENTIAL (CANCER CENTER ONLY)
Abs Immature Granulocytes: 0.03 10*3/uL (ref 0.00–0.07)
Basophils Absolute: 0 10*3/uL (ref 0.0–0.1)
Basophils Relative: 1 %
Eosinophils Absolute: 0.3 10*3/uL (ref 0.0–0.5)
Eosinophils Relative: 5 %
HCT: 37.3 % (ref 36.0–46.0)
Hemoglobin: 12.4 g/dL (ref 12.0–15.0)
Immature Granulocytes: 1 %
Lymphocytes Relative: 26 %
Lymphs Abs: 1.5 10*3/uL (ref 0.7–4.0)
MCH: 31.8 pg (ref 26.0–34.0)
MCHC: 33.2 g/dL (ref 30.0–36.0)
MCV: 95.6 fL (ref 80.0–100.0)
Monocytes Absolute: 0.5 10*3/uL (ref 0.1–1.0)
Monocytes Relative: 9 %
Neutro Abs: 3.5 10*3/uL (ref 1.7–7.7)
Neutrophils Relative %: 58 %
Platelet Count: 188 10*3/uL (ref 150–400)
RBC: 3.9 MIL/uL (ref 3.87–5.11)
RDW: 13.2 % (ref 11.5–15.5)
WBC Count: 5.8 10*3/uL (ref 4.0–10.5)
nRBC: 0 % (ref 0.0–0.2)

## 2021-02-15 LAB — CMP (CANCER CENTER ONLY)
ALT: 14 U/L (ref 0–44)
AST: 16 U/L (ref 15–41)
Albumin: 3.8 g/dL (ref 3.5–5.0)
Alkaline Phosphatase: 176 U/L — ABNORMAL HIGH (ref 38–126)
Anion gap: 6 (ref 5–15)
BUN: 9 mg/dL (ref 8–23)
CO2: 22 mmol/L (ref 22–32)
Calcium: 9.2 mg/dL (ref 8.9–10.3)
Chloride: 111 mmol/L (ref 98–111)
Creatinine: 0.86 mg/dL (ref 0.44–1.00)
GFR, Estimated: >60 mL/min (ref >60)
Glucose, Bld: 126 mg/dL — ABNORMAL HIGH (ref 70–99)
Potassium: 3.7 mmol/L (ref 3.5–5.1)
Sodium: 139 mmol/L (ref 135–145)
Total Bilirubin: 0.6 mg/dL (ref 0.3–1.2)
Total Protein: 7.7 g/dL (ref 6.5–8.1)

## 2021-02-15 LAB — CEA (IN HOUSE-CHCC): CEA (CHCC-In House): 2.33 ng/mL (ref 0.00–5.00)

## 2021-02-15 NOTE — Telephone Encounter (Signed)
Scheduled follow-up appointment per 3/18 los. Patient is aware.

## 2021-02-18 ENCOUNTER — Telehealth: Payer: Self-pay

## 2021-02-18 NOTE — Telephone Encounter (Signed)
TCT patient as requested below, and she verbalized understanding.

## 2021-02-18 NOTE — Telephone Encounter (Signed)
-----   Message from Truitt Merle, MD sent at 02/18/2021  6:36 AM EDT ----- Please let pt know her tumor marker was normal last week, no other concerns on lab, good news. Thanks   Truitt Merle

## 2021-02-26 ENCOUNTER — Ambulatory Visit
Admission: RE | Admit: 2021-02-26 | Discharge: 2021-02-26 | Disposition: A | Discharge status: Home / Self Care | Payer: Medicare PPO | Source: Ambulatory Visit | Attending: Internal Medicine | Admitting: Internal Medicine

## 2021-02-26 ENCOUNTER — Encounter: Payer: Self-pay | Admitting: Internal Medicine

## 2021-02-26 ENCOUNTER — Other Ambulatory Visit: Payer: Self-pay

## 2021-02-26 ENCOUNTER — Ambulatory Visit (INDEPENDENT_AMBULATORY_CARE_PROVIDER_SITE_OTHER): Payer: Medicare PPO | Admitting: Internal Medicine

## 2021-02-26 VITALS — BP 162/68 | HR 91 | Temp 98.4°F | Ht 62.0 in | Wt 181.0 lb

## 2021-02-26 DIAGNOSIS — R29818 Other symptoms and signs involving the nervous system: Secondary | ICD-10-CM | POA: Diagnosis not present

## 2021-02-26 DIAGNOSIS — R262 Difficulty in walking, not elsewhere classified: Secondary | ICD-10-CM | POA: Diagnosis not present

## 2021-02-26 DIAGNOSIS — J320 Chronic maxillary sinusitis: Secondary | ICD-10-CM | POA: Diagnosis not present

## 2021-02-26 DIAGNOSIS — J3489 Other specified disorders of nose and nasal sinuses: Secondary | ICD-10-CM | POA: Diagnosis not present

## 2021-02-26 LAB — CBC
HCT: 41.7 % (ref 36.0–46.0)
Hemoglobin: 13.9 g/dL (ref 12.0–15.0)
MCHC: 33.3 g/dL (ref 30.0–36.0)
MCV: 95.8 fl (ref 78.0–100.0)
Platelets: 230 10*3/uL (ref 150.0–400.0)
RBC: 4.35 Mil/uL (ref 3.87–5.11)
RDW: 13.5 % (ref 11.5–15.5)
WBC: 5.4 10*3/uL (ref 4.0–10.5)

## 2021-02-26 LAB — COMPREHENSIVE METABOLIC PANEL
ALT: 13 U/L (ref 0–35)
AST: 18 U/L (ref 0–37)
Albumin: 4.5 g/dL (ref 3.5–5.2)
Alkaline Phosphatase: 179 U/L — ABNORMAL HIGH (ref 39–117)
BUN: 9 mg/dL (ref 6–23)
CO2: 25 mEq/L (ref 19–32)
Calcium: 9.8 mg/dL (ref 8.4–10.5)
Chloride: 106 mEq/L (ref 96–112)
Creatinine, Ser: 0.82 mg/dL (ref 0.40–1.20)
GFR: 64.96 mL/min (ref >60.00)
Glucose, Bld: 93 mg/dL (ref 70–99)
Potassium: 3.7 mEq/L (ref 3.5–5.1)
Sodium: 141 mEq/L (ref 135–145)
Total Bilirubin: 0.6 mg/dL (ref 0.2–1.2)
Total Protein: 8.3 g/dL (ref 6.0–8.3)

## 2021-02-26 LAB — VITAMIN B12: Vitamin B-12: 214 pg/mL (ref 211–911)

## 2021-02-26 LAB — TSH: TSH: 1.11 u[IU]/mL (ref 0.35–4.50)

## 2021-02-26 LAB — BRAIN NATRIURETIC PEPTIDE: Pro B Natriuretic peptide (BNP): 34 pg/mL (ref 0.0–100.0)

## 2021-02-26 NOTE — Patient Instructions (Signed)
We will get a head CT head today to make sure you haven't had a stroke.   We are also checking the labs today.

## 2021-02-26 NOTE — Progress Notes (Signed)
   Subjective:   Patient ID: Jamie [name], female    DOB: 14-Aug-1933, 87 y.o.   MRN: [id number]  HPI The patient is an 87 YO female coming in for concerns about new vision blurring and dizziness. Started yesterday when she woke up. She has had vertigo in the past but this feels different. She denies lightheadedness or feeling like she might pass out. She denies fevers or chills. Does have chronic knee pain and some weakness with exertion which is not new. Denies speech changes. The vision blurring is about the same as yesterday but maybe mildly improved. Dizziness is about the same and she feels like she is going to the side with walking. Denies loss of vision or double vision.   Review of Systems  Constitutional: Negative.   HENT: Negative.   Eyes: Positive for visual disturbance.  Respiratory: Negative for cough, chest tightness and shortness of breath.   Cardiovascular: Negative for chest pain, palpitations and leg swelling.  Gastrointestinal: Negative for abdominal distention, abdominal pain, constipation, diarrhea, nausea and vomiting.  Musculoskeletal: Negative.   Skin: Negative.   Neurological: Positive for dizziness.  Psychiatric/Behavioral: Negative.     Objective:  Physical Exam Constitutional:      Appearance: She is well-developed.  HENT:     Head: Normocephalic and atraumatic.  Eyes:     Pupils: Pupils are equal, round, and reactive to light.  Cardiovascular:     Rate and Rhythm: Normal rate and regular rhythm.  Pulmonary:     Effort: Pulmonary effort is normal. No respiratory distress.     Breath sounds: Normal breath sounds. No wheezing or rales.  Abdominal:     General: Bowel sounds are normal. There is no distension.     Palpations: Abdomen is soft.     Tenderness: There is no abdominal tenderness. There is no rebound.  Musculoskeletal:     Cervical back: Normal range of motion.  Skin:    General: Skin is warm and dry.  Neurological:     Mental Status:  She is alert and oriented to person, place, and time.     Cranial Nerves: No cranial nerve deficit.     Sensory: No sensory deficit.     Coordination: Coordination abnormal.     Comments: Slow to stand and hesitant gait with walker, sensation intact throughout and strength 4/4 bil UE and LE. CN 2-12 intact     Vitals:   02/26/21 1045  BP: (!) 162/68  Pulse: 91  Temp: 98.4 F (36.9 C)  TempSrc: Oral  SpO2: 99%  Weight: 181 lb (82.1 kg)  Height: 5\' 2"  (1.575 m)    This visit occurred during the SARS-CoV-2 public health emergency.  Safety protocols were in place, including screening questions prior to the visit, additional usage of staff PPE, and extensive cleaning of exam room while observing appropriate contact time as indicated for disinfecting solutions.   Assessment & Plan:  Visit time 20 minutes in face to face communication with patient and coordination of care, additional 10 minutes spent in record review, coordination or care, ordering tests, communicating/referring to other healthcare professionals, documenting in medical records all on the same day of the visit for total time 30 minutes spent on the visit.

## 2021-02-26 NOTE — Assessment & Plan Note (Signed)
Concern for stroke given her dizziness is different than prior and accompanied by visual changes. Ordered head CT stat and labs to rule out metabolic causes.

## 2021-02-28 ENCOUNTER — Ambulatory Visit (INDEPENDENT_AMBULATORY_CARE_PROVIDER_SITE_OTHER): Payer: Medicare PPO

## 2021-02-28 ENCOUNTER — Other Ambulatory Visit: Payer: Self-pay

## 2021-02-28 ENCOUNTER — Ambulatory Visit: Payer: Medicare PPO | Admitting: Internal Medicine

## 2021-02-28 VITALS — BP 140/60 | HR 93 | Temp 98.2°F | Resp 16 | Ht 62.0 in | Wt 181.2 lb

## 2021-02-28 DIAGNOSIS — Z0000 Encounter for general adult medical examination without abnormal findings: Secondary | ICD-10-CM

## 2021-02-28 NOTE — Progress Notes (Signed)
Subjective:   Jamie [name] is a 87 y.o. female who presents for Medicare Annual (Subsequent) preventive examination.  Review of Systems    No ROS. Medicare Wellness Visit. Additional risk factors are reflected in social history. Cardiac Risk Factors include: advanced age (>22men, >23 women);dyslipidemia;hypertension;obesity (BMI >30kg/m2);family history of premature cardiovascular disease     Objective:    Today's Vitals   02/28/21 1033  BP: 140/60  Pulse: 93  Resp: 16  Temp: 98.2 F (36.8 C)  SpO2: 96%  Weight: 181 lb 3.2 oz (82.2 kg)  Height: 5\' 2"  (1.575 m)  PainSc: 0-No pain   Body mass index is 33.14 kg/m.  Advanced Directives 02/28/2021 10/17/2020 12/15/2019 12/12/2019 10/14/2019 10/12/2018 07/16/2017  Does Patient Have a Medical Advance Directive? No No No No No No No  Does patient want to make changes to medical advance directive? - - - - - Yes (ED - Information included in AVS) -  Copy of [place] in Chart? - - - - - - -  Would patient like information on creating a medical advance directive? No - Patient declined - No - Patient declined No - Patient declined Yes (ED - Information included in AVS) - -    Current Medications (verified) Outpatient Encounter Medications as of 02/28/2021  Medication Sig  . acetaminophen (TYLENOL) 500 MG tablet Take 500-1,000 mg by mouth every 6 (six) hours as needed (for pain.).   Marland Kitchen albuterol (VENTOLIN HFA) 108 (90 Base) MCG/ACT inhaler INHALE 2 PUFFS INTO THE LUNGS EVERY 4 (FOUR) HOURS AS NEEDED FOR WHEEZING OR SHORTNESS OF BREATH.  Marland Kitchen ALPRAZolam (XANAX) 0.5 MG tablet TAKE 1 TABLET BY MOUTH EVERY DAY AS NEEDED FOR ANXIETY  . alum & mag hydroxide-simeth (MAALOX/MYLANTA) 200-200-20 MG/5ML suspension Take 15 mLs by mouth every 6 (six) hours as needed for indigestion or heartburn.  Marland Kitchen amLODipine (NORVASC) 5 MG tablet TAKE 1 TABLET BY MOUTH EVERY DAY  . atorvastatin (LIPITOR) 20 MG tablet TAKE 1 TABLET DAILY AT 6  PM. ANNUAL APPT DUE IN OCT FOR FUTURE REFILLS  . BREO ELLIPTA 100-25 MCG/INH AEPB INHALE 1 PUFF BY MOUTH EVERY DAY  . famotidine (PEPCID) 20 MG tablet TAKE 1 TABLET BY MOUTH TWICE A DAY  . furosemide (LASIX) 40 MG tablet TAKE 1 TABLET BY MOUTH EVERY DAY  . olmesartan (BENICAR) 40 MG tablet TAKE 1 TABLET BY MOUTH EVERY DAY  . ondansetron (ZOFRAN-ODT) 4 MG disintegrating tablet TAKE 1 TABLET BY MOUTH EVERY 8 HOURS AS NEEDED FOR NAUSEA AND VOMITING  . sennosides-docusate sodium (SENOKOT-S) 8.6-50 MG tablet Take 1 tablet by mouth daily as needed for constipation.  . triamcinolone (KENALOG) 0.1 % Apply 1 application topically 2 (two) times daily as needed (skin irritation/rash).   No facility-administered encounter medications on file as of 02/28/2021.    Allergies (verified) Aspirin and Latex   History: Past Medical History:  Diagnosis Date  . Adenocarcinoma of cecum ([place]) 09/28/2019  . ANEMIA, IRON DEFICIENCY 09/10/2007  . Anxiety   . Arthritis   . Asthma   . Blood transfusion without reported diagnosis   . Cataract 2008   bilateral cataract extraction  . CATARACT EXTRACTION, HX OF 09/10/2007  . DYSPEPSIA, HX OF 09/10/2007  . GERD (gastroesophageal reflux disease)   . HAIR LOSS 03/21/2009  . HAY FEVER 09/10/2007  . HEARING LOSS, SENSORINEURAL 09/10/2007  . HYPERLIPIDEMIA 09/10/2007  . HYPERTENSION 09/10/2007  . LOW BACK PAIN 05/10/2008  . Pain in joint, hand 03/01/2008  .  PEPTIC ULCER DISEASE 09/10/2007  . POLYP, COLON 09/10/2007  . POLYPECTOMY, HX OF 09/10/2007  . Pulmonary embolism ([place]) 1- 2 non-occlusive 10/10/2019  . SHINGLES 08/13/2009  . SHOULDER PAIN, LEFT 02/12/2008  . TAH/BSO, HX OF 09/10/2007  . Tuberculosis    PATIENT DENIES AT PHONE INTERVIEW ON 12/12/2019   Past Surgical History:  Procedure Laterality Date  . ABDOMINAL HYSTERECTOMY    . BREAST SURGERY     breast biopsy-benign  . CATARACT EXTRACTION    . COLONOSCOPY    . EYE SURGERY     bilateral cataract with  lens implant  . IRRIGATION AND DEBRIDEMENT SEBACEOUS CYST    . LAPAROSCOPIC RIGHT HEMI COLECTOMY Right 12/15/2019   Procedure: LAPAROSCOPIC RIGHT HEMI COLECTOMY WITH LYSIS OF ADHESIONS;  Surgeon: Ileana Roup, MD;  Location: [facility];  Service: General;  Laterality: Right;  . TOTAL ABDOMINAL HYSTERECTOMY W/ BILATERAL SALPINGOOPHORECTOMY     Family History  Problem Relation Age of Onset  . Hypertension Mother   . Coronary artery disease Mother   . Heart disease Mother        CHF  . Cancer Son 31       prostate cancer   . Cancer Son 12       prostate cancer  . Diabetes Neg Hx    Social History   Socioeconomic History  . Marital status: Widowed    Spouse name: Not on file  . Number of children: 4  . Years of education: Not on file  . Highest education level: Not on file  Occupational History  . Occupation: retired  Tobacco Use  . Smoking status: Former Smoker    Packs/day: 0.20    Years: 25.00    Pack years: 5.00    Types: Cigarettes    Quit date: 12/01/1989    Years since quitting: 31.2  . Smokeless tobacco: Never Used  . Tobacco comment: smoked a long time ago; Quit 90 or 91/ number is approximate  Vaping Use  . Vaping Use: Never used  Substance and Sexual Activity  . Alcohol use: No    Alcohol/week: 0.0 standard drinks  . Drug use: No  . Sexual activity: Not Currently  Other Topics Concern  . Not on file  Social History Narrative   3 sons, 77,59, 92 and 1 daughter 23, 4 grandchildren   SO with multiple medical problems   Social Determinants of Health   Financial Resource Strain: Low Risk   . Difficulty of Paying Living Expenses: Not hard at all  Food Insecurity: No Food Insecurity  . Worried About Charity fundraiser in the Last Year: Never true  . Ran Out of Food in the Last Year: Never true  Transportation Needs: No Transportation Needs  . Lack of Transportation (Medical): No  . Lack of Transportation (Non-Medical): No  Physical Activity: Inactive  .  Days of Exercise per Week: 0 days  . Minutes of Exercise per Session: 0 min  Stress: No Stress Concern Present  . Feeling of Stress : Not at all  Social Connections: Moderately Isolated  . Frequency of Communication with Friends and Family: More than three times a week  . Frequency of Social Gatherings with Friends and Family: More than three times a week  . Attends Religious Services: More than 4 times per year  . Active Member of Clubs or Organizations: No  . Attends Archivist Meetings: Never  . Marital Status: Widowed    Tobacco Counseling Counseling given:  Not Answered Comment: smoked a long time ago; Quit 90 or 91/ number is approximate   Clinical Intake:  Pre-visit preparation completed: Yes  Pain : No/denies pain Pain Score: 0-No pain     BMI - recorded: 33.14 Nutritional Status: BMI > 30  Obese Nutritional Risks: None Diabetes: No  How often do you need to have someone help you when you read instructions, pamphlets, or other written materials from your doctor or pharmacy?: 1 - Never What is the last grade level you completed in school?: 11th grade  Diabetic? no  Interpreter Needed?: No  Information entered by :: Lisette Abu, LPN   Activities of Daily Living In your present state of health, do you have any difficulty performing the following activities: 02/28/2021  Hearing? N  Vision? N  Difficulty concentrating or making decisions? N  Walking or climbing stairs? N  Dressing or bathing? N  Doing errands, shopping? N  Preparing Food and eating ? N  Using the Toilet? N  In the past six months, have you accidently leaked urine? N  Do you have problems with loss of bowel control? N  Managing your Medications? N  Managing your Finances? N  Housekeeping or managing your Housekeeping? N  Some recent data might be hidden    Patient Care Team: Hoyt Koch, MD as PCP - General (Internal Medicine) Karl Luke, MD as Referring  Physician (Optometry) Dema Severin [place], MD as Consulting Physician (General Surgery) Truitt Merle, MD as Consulting Physician (Hematology) Gatha Mayer, MD as Consulting Physician (Gastroenterology)  Indicate any recent Medical Services you may have received from other than [facility] providers in the past year (date may be approximate).     Assessment:   This is a routine wellness examination for [place].  Hearing/Vision screen No exam data present  Dietary issues and exercise activities discussed: Current Exercise Habits: The patient does not participate in regular exercise at present, Exercise limited by: respiratory conditions(s)  Goals    . Patient Stated     Stay as healthy and as independent as possible.       Depression Screen PHQ 2/9 Scores 02/28/2021 02/26/2021 03/16/2020 10/14/2019 10/12/2018 09/17/2017 10/03/2016  PHQ - 2 Score 0 0 0 0 0 0 0  PHQ- 9 Score - - - - 0 - -    Fall Risk Fall Risk  02/28/2021 02/26/2021 03/16/2020 10/14/2019 10/12/2018  Falls in the past year? 0 0 0 0 0  Number falls in past yr: 0 0 0 0 -  Injury with Fall? 0 0 0 0 -  Risk for fall due to : No Fall Risks - No Fall Risks - Impaired balance/gait;Impaired mobility  Follow up Falls evaluation completed - Falls evaluation completed - Falls prevention discussed  Comment - - - - feels unsteady while in the shower     [place]:  Any stairs in or around the home? No  If so, are there any without handrails? No  Home free of loose throw rugs in walkways, pet beds, electrical cords, etc? Yes  Adequate lighting in your home to reduce risk of falls? Yes   ASSISTIVE DEVICES UTILIZED TO PREVENT FALLS:  Life alert? No  Use of a cane, walker or w/c? Yes  Grab bars in the bathroom? Yes  Shower chair or bench in shower? Yes  Elevated toilet seat or a handicapped toilet? Yes   TIMED UP AND GO:  Was the test performed? No .  Length of time to ambulate 10 feet: 0  sec.   Gait steady and fast with assistive device  Cognitive Function: Normal cognitive status assessed by direct observation by this Nurse Health Advisor. No abnormalities found.   MMSE - Mini Mental State Exam 10/12/2018 08/10/2015  Not completed: - (No Data)  Orientation to time 5 -  Orientation to Place 5 -  Registration 3 -  Attention/ Calculation 3 -  Recall 3 -  Language- name 2 objects 2 -  Language- repeat 1 -  Language- follow 3 step command 3 -  Language- read & follow direction 1 -  Write a sentence 1 -  Copy design 1 -  Total score 28 -     6CIT Screen 10/14/2019  What Year? 0 points  What month? 0 points  What time? 0 points  Count back from 20 0 points  Months in reverse 0 points  Repeat phrase 2 points  Total Score 2    Immunizations Immunization History  Administered Date(s) Administered  . Fluad Quad(high Dose 65+) 09/15/2019, 10/12/2020  . Influenza Split 09/21/2012  . Influenza Whole 11/08/2007, 09/12/2008, 09/11/2009, 09/24/2010  . Influenza, High Dose Seasonal PF 10/11/2013, 08/15/2016, 10/06/2017, 09/01/2018  . Influenza,inj,Quad PF,6+ Mos 08/08/2014, 08/14/2015  . PFIZER(Purple Top)SARS-COV-2 Vaccination 01/27/2020, 02/22/2020, 09/29/2020  . Pneumococcal Conjugate-13 09/22/2013  . Pneumococcal Polysaccharide-23 03/01/2008, 12/18/2019  . Td 05/17/2010    TDAP status: Due, Education has been provided regarding the importance of this vaccine. Advised may receive this vaccine at local pharmacy or Health Dept. Aware to provide a copy of the vaccination record if obtained from local pharmacy or Health Dept. Verbalized acceptance and understanding.  Flu Vaccine status: Up to date  Pneumococcal vaccine status: Up to date  Covid-19 vaccine status: Completed vaccines  Qualifies for Shingles Vaccine? Yes   Zostavax completed No   Shingrix Completed?: No.    Education has been provided regarding the importance of this vaccine. Patient has been advised  to call insurance company to determine out of pocket expense if they have not yet received this vaccine. Advised may also receive vaccine at local pharmacy or Health Dept. Verbalized acceptance and understanding.  Screening Tests Health Maintenance  Topic Date Due  . MAMMOGRAM  10/28/2019  . TETANUS/TDAP  05/17/2020  . COLONOSCOPY (Pts 45-26yrs Insurance coverage will need to be confirmed)  10/03/2020  . COVID-19 Vaccine (4 - Booster for Pfizer series) 03/30/2021  . INFLUENZA VACCINE  Completed  . DEXA SCAN  Completed  . PNA vac Low Risk Adult  Completed  . HPV VACCINES  Aged Out    Health Maintenance  Health Maintenance Due  Topic Date Due  . MAMMOGRAM  10/28/2019  . TETANUS/TDAP  05/17/2020  . COLONOSCOPY (Pts 45-17yrs Insurance coverage will need to be confirmed)  10/03/2020    Colorectal cancer screening: Type of screening: Colonoscopy. Completed 10/04/2019. Repeat every 2 years  Mammogram status: Completed 05/26/2020. Repeat every year  Bone Density status: Completed 08/03/2012. Results reflect: Bone density results: NORMAL. Repeat every 0 years.  Lung Cancer Screening: (Low Dose CT Chest recommended if Age 92-80 years, 30 pack-year currently smoking OR have quit w/in 15years.) does not qualify.   Lung Cancer Screening Referral: no  Additional Screening:  Hepatitis C Screening: does not qualify; Completed no  Vision Screening: Recommended annual ophthalmology exams for early detection of glaucoma and other disorders of the eye. Is the patient up to date with their annual eye exam?  Yes  Who is  the provider or what is the name of the office in which the patient attends annual eye exams? [facility] If pt is not established with a provider, would they like to be referred to a provider to establish care? No .   Dental Screening: Recommended annual dental exams for proper oral hygiene  Community Resource Referral / Chronic Care Management: CRR required this visit?   No   CCM required this visit?  No      Plan:     I have personally reviewed and noted the following in the patient's chart:   . Medical and social history . Use of alcohol, tobacco or illicit drugs  . Current medications and supplements . Functional ability and status . Nutritional status . Physical activity . Advanced directives . List of other physicians . Hospitalizations, surgeries, and ER visits in previous 12 months . Vitals . Screenings to include cognitive, depression, and falls . Referrals and appointments  In addition, I have reviewed and discussed with patient certain preventive protocols, quality metrics, and best practice recommendations. A written personalized care plan for preventive services as well as general preventive health recommendations were provided to patient.     Sheral Flow, LPN   [id number]   Nurse Notes:  Medications reviewed with patient; no opioid use noted.

## 2021-02-28 NOTE — Patient Instructions (Addendum)
Jamie [name] , Thank you for taking time to come for your Medicare Wellness Visit. I appreciate your ongoing commitment to your health goals. Please review the following plan we discussed and let me know if I can assist you in the future.   Screening recommendations/referrals: Colonoscopy: 10/04/2019; due every year Mammogram: 05/29/2020 Bone Density: 08/03/3012; normal Recommended yearly ophthalmology/optometry visit for glaucoma screening and checkup Recommended yearly dental visit for hygiene and checkup  Vaccinations: Influenza vaccine: 10/12/2020 Pneumococcal vaccine: 09/22/2013, 12/18/2019 Tdap vaccine: 05/17/2010; overdue (will get at next appt with Dr. Sharlet Salina) Shingles vaccine: never done   Covid-19: 01/27/2020, 02/22/2020, 09/29/2020  Advanced directives: Advance directive discussed with you today. Even though you declined this today please call our office should you change your mind and we can give you the proper paperwork for you to fill out.  Conditions/risks identified: Yes. Reviewed health maintenance screenings with patient today and relevant education, vaccines, and/or referrals were provided. Continue doing brain stimulating activities (puzzles, reading, adult coloring books, staying active) to keep memory sharp. Continue to eat heart healthy diet (full of fruits, vegetables, whole grains, lean protein, water--limit salt, fat, and sugar intake) and increase physical activity as tolerated.  Next appointment: Please schedule your next Medicare Wellness Visit with your Nurse Health Advisor in 1 year by calling [phone].  Preventive Care 85 Years and Older, Female Preventive care refers to lifestyle choices and visits with your health care provider that can promote health and wellness. What does preventive care include?  A yearly physical exam. This is also called an annual well check.  Dental exams once or twice a year.  Routine eye exams. Ask your health care provider how  often you should have your eyes checked.  Personal lifestyle choices, including:  Daily care of your teeth and gums.  Regular physical activity.  Eating a healthy diet.  Avoiding tobacco and drug use.  Limiting alcohol use.  Practicing safe sex.  Taking low-dose aspirin every day.  Taking vitamin and mineral supplements as recommended by your health care provider. What happens during an annual well check? The services and screenings done by your health care provider during your annual well check will depend on your age, overall health, lifestyle risk factors, and family history of disease. Counseling  Your health care provider may ask you questions about your:  Alcohol use.  Tobacco use.  Drug use.  Emotional well-being.  Home and relationship well-being.  Sexual activity.  Eating habits.  History of falls.  Memory and ability to understand (cognition).  Work and work Statistician.  Reproductive health. Screening  You may have the following tests or measurements:  Height, weight, and BMI.  Blood pressure.  Lipid and cholesterol levels. These may be checked every 5 years, or more frequently if you are over 34 years old.  Skin check.  Lung cancer screening. You may have this screening every year starting at age 41 if you have a 30-pack-year history of smoking and currently smoke or have quit within the past 15 years.  Fecal occult blood test (FOBT) of the stool. You may have this test every year starting at age 55.  Flexible sigmoidoscopy or colonoscopy. You may have a sigmoidoscopy every 5 years or a colonoscopy every 10 years starting at age 83.  Hepatitis C blood test.  Hepatitis B blood test.  Sexually transmitted disease (STD) testing.  Diabetes screening. This is done by checking your blood sugar (glucose) after you have not eaten for a while (fasting). You  may have this done every 1-3 years.  Bone density scan. This is done to screen for  osteoporosis. You may have this done starting at age 38.  Mammogram. This may be done every 1-2 years. Talk to your health care provider about how often you should have regular mammograms. Talk with your health care provider about your test results, treatment options, and if necessary, the need for more tests. Vaccines  Your health care provider may recommend certain vaccines, such as:  Influenza vaccine. This is recommended every year.  Tetanus, diphtheria, and acellular pertussis (Tdap, Td) vaccine. You may need a Td booster every 10 years.  Zoster vaccine. You may need this after age 28.  Pneumococcal 13-valent conjugate (PCV13) vaccine. One dose is recommended after age 66.  Pneumococcal polysaccharide (PPSV23) vaccine. One dose is recommended after age 95. Talk to your health care provider about which screenings and vaccines you need and how often you need them. This information is not intended to replace advice given to you by your health care provider. Make sure you discuss any questions you have with your health care provider. Document Released: 12/14/2015 Document Revised: 08/06/2016 Document Reviewed: 09/18/2015 Elsevier Interactive Patient Education  2017 [employer] Prevention in the Home Falls can cause injuries. They can happen to people of all ages. There are many things you can do to make your home safe and to help prevent falls. What can I do on the outside of my home?  Regularly fix the edges of walkways and driveways and fix any cracks.  Remove anything that might make you trip as you walk through a door, such as a raised step or threshold.  Trim any bushes or trees on the path to your home.  Use bright outdoor lighting.  Clear any walking paths of anything that might make someone trip, such as rocks or tools.  Regularly check to see if handrails are loose or broken. Make sure that both sides of any steps have handrails.  Any raised decks and porches  should have guardrails on the edges.  Have any leaves, snow, or ice cleared regularly.  Use sand or salt on walking paths during winter.  Clean up any spills in your garage right away. This includes oil or grease spills. What can I do in the bathroom?  Use night lights.  Install grab bars by the toilet and in the tub and shower. Do not use towel bars as grab bars.  Use non-skid mats or decals in the tub or shower.  If you need to sit down in the shower, use a plastic, non-slip stool.  Keep the floor dry. Clean up any water that spills on the floor as soon as it happens.  Remove soap buildup in the tub or shower regularly.  Attach bath mats securely with double-sided non-slip rug tape.  Do not have throw rugs and other things on the floor that can make you trip. What can I do in the bedroom?  Use night lights.  Make sure that you have a light by your bed that is easy to reach.  Do not use any sheets or blankets that are too big for your bed. They should not hang down onto the floor.  Have a firm chair that has side arms. You can use this for support while you get dressed.  Do not have throw rugs and other things on the floor that can make you trip. What can I do in the kitchen?  Clean  up any spills right away.  Avoid walking on wet floors.  Keep items that you use a lot in easy-to-reach places.  If you need to reach something above you, use a strong step stool that has a grab bar.  Keep electrical cords out of the way.  Do not use floor polish or wax that makes floors slippery. If you must use wax, use non-skid floor wax.  Do not have throw rugs and other things on the floor that can make you trip. What can I do with my stairs?  Do not leave any items on the stairs.  Make sure that there are handrails on both sides of the stairs and use them. Fix handrails that are broken or loose. Make sure that handrails are as long as the stairways.  Check any carpeting to  make sure that it is firmly attached to the stairs. Fix any carpet that is loose or worn.  Avoid having throw rugs at the top or bottom of the stairs. If you do have throw rugs, attach them to the floor with carpet tape.  Make sure that you have a light switch at the top of the stairs and the bottom of the stairs. If you do not have them, ask someone to add them for you. What else can I do to help prevent falls?  Wear shoes that:  Do not have high heels.  Have rubber bottoms.  Are comfortable and fit you well.  Are closed at the toe. Do not wear sandals.  If you use a stepladder:  Make sure that it is fully opened. Do not climb a closed stepladder.  Make sure that both sides of the stepladder are locked into place.  Ask someone to hold it for you, if possible.  Clearly mark and make sure that you can see:  Any grab bars or handrails.  First and last steps.  Where the edge of each step is.  Use tools that help you move around (mobility aids) if they are needed. These include:  Canes.  Walkers.  Scooters.  Crutches.  Turn on the lights when you go into a dark area. Replace any light bulbs as soon as they burn out.  Set up your furniture so you have a clear path. Avoid moving your furniture around.  If any of your floors are uneven, fix them.  If there are any pets around you, be aware of where they are.  Review your medicines with your doctor. Some medicines can make you feel dizzy. This can increase your chance of falling. Ask your doctor what other things that you can do to help prevent falls. This information is not intended to replace advice given to you by your health care provider. Make sure you discuss any questions you have with your health care provider. Document Released: 09/13/2009 Document Revised: 04/24/2016 Document Reviewed: 12/22/2014 Elsevier Interactive Patient Education  2017 [employer].

## 2021-03-10 ENCOUNTER — Other Ambulatory Visit: Payer: Self-pay | Admitting: Internal Medicine

## 2021-03-10 DIAGNOSIS — E782 Mixed hyperlipidemia: Secondary | ICD-10-CM

## 2021-03-14 ENCOUNTER — Telehealth: Payer: Self-pay

## 2021-03-14 NOTE — Telephone Encounter (Signed)
Jamie [name] called in to ask about what medicines to take prior to her procedure on 03/18/21. We went over them and she verbalized understanding.

## 2021-03-18 ENCOUNTER — Other Ambulatory Visit: Payer: Self-pay

## 2021-03-18 ENCOUNTER — Encounter: Payer: Self-pay | Admitting: Internal Medicine

## 2021-03-18 ENCOUNTER — Ambulatory Visit (AMBULATORY_SURGERY_CENTER): Payer: Medicare PPO | Admitting: Internal Medicine

## 2021-03-18 VITALS — BP 108/54 | HR 63 | Temp 98.0°F | Resp 22 | Ht 62.0 in | Wt 182.0 lb

## 2021-03-18 DIAGNOSIS — K621 Rectal polyp: Secondary | ICD-10-CM | POA: Diagnosis not present

## 2021-03-18 DIAGNOSIS — I10 Essential (primary) hypertension: Secondary | ICD-10-CM | POA: Diagnosis not present

## 2021-03-18 DIAGNOSIS — Z85038 Personal history of other malignant neoplasm of large intestine: Secondary | ICD-10-CM

## 2021-03-18 DIAGNOSIS — K635 Polyp of colon: Secondary | ICD-10-CM | POA: Diagnosis not present

## 2021-03-18 DIAGNOSIS — D128 Benign neoplasm of rectum: Secondary | ICD-10-CM

## 2021-03-18 DIAGNOSIS — K529 Noninfective gastroenteritis and colitis, unspecified: Secondary | ICD-10-CM | POA: Diagnosis not present

## 2021-03-18 DIAGNOSIS — D125 Benign neoplasm of sigmoid colon: Secondary | ICD-10-CM

## 2021-03-18 MED ORDER — SODIUM CHLORIDE 0.9 % IV SOLN: 500.0000 mL | Freq: Once | INTRAVENOUS | Status: DC

## 2021-03-18 NOTE — Progress Notes (Signed)
Report given to PACU, vss 

## 2021-03-18 NOTE — Op Note (Signed)
[facility] Patient Name: Jamie [name] Procedure Date: 03/18/2021 2:46 PM MRN: [id number] Endoscopist: Gatha Mayer , MD Age: 87 Referring MD:  Date of Birth: 1932/12/13 Gender: Female Account #: [id number] Procedure:                Colonoscopy Indications:              High risk colon cancer surveillance: Personal                            history of colon cancer Medicines:                Propofol per Anesthesia, Monitored Anesthesia Care Procedure:                Pre-Anesthesia Assessment:                           - Prior to the procedure, a History and Physical                            was performed, and patient medications and                            allergies were reviewed. The patient's tolerance of                            previous anesthesia was also reviewed. The risks                            and benefits of the procedure and the sedation                            options and risks were discussed with the patient.                            All questions were answered, and informed consent                            was obtained. Prior Anticoagulants: The patient has                            taken no previous anticoagulant or antiplatelet                            agents. ASA Grade Assessment: II - A patient with                            mild systemic disease. After reviewing the risks                            and benefits, the patient was deemed in                            satisfactory condition to undergo the procedure.  After obtaining informed consent, the colonoscope                            was passed under direct vision. Throughout the                            procedure, the patient's blood pressure, pulse, and                            oxygen saturations were monitored continuously. The                            Olympus CF-HQ190L [id number]) Colonoscope was                            introduced  through the anus and advanced to the the                            terminal ileum. The colonoscopy was somewhat                            difficult due to significant looping. Successful                            completion of the procedure was aided by applying                            abdominal pressure. The patient tolerated the                            procedure well. The quality of the bowel                            preparation was good. The rectum and Ileocolonic                            anastomsis areas were photographed. The bowel                            preparation used was Miralax via split dose                            instruction. Scope In: 2:59:22 PM Scope Out: 3:15:39 PM Scope Withdrawal Time: 0 hours 10 minutes 42 seconds  Total Procedure Duration: 0 hours 16 minutes 17 seconds  Findings:                 The perianal and digital rectal examinations were                            normal.                           Multiple diverticula were found in the sigmoid  colon.                           Three sessile polyps were found in the rectum and                            sigmoid colon. The polyps were diminutive in size.                            These polyps were removed with a cold biopsy                            forceps. Resection and retrieval were complete.                            Verification of patient identification for the                            specimen was done. Estimated blood loss was minimal.                           There was evidence of a prior end-to-side                            ileo-colonic anastomosis in the transverse colon.                            This was patent and was characterized by healthy                            appearing mucosa. The anastomosis was traversed. Impression:               - Diverticulosis in the sigmoid colon.                           - Three diminutive polyps in the  rectum and in the                            sigmoid colon, removed with a cold biopsy forceps.                            Resected and retrieved.                           - Patent end-to-side ileo-colonic anastomosis,                            characterized by healthy appearing mucosa. Recommendation:           - Patient has a contact number available for                            emergencies. The signs and symptoms of potential  delayed complications were discussed with the                            patient. Return to normal activities tomorrow.                            Written discharge instructions were provided to the                            patient.                           - Resume previous diet.                           - Continue present medications.                           - No routine repeat colonoscopy planned at her age                            and with these findings - will review pathology but                            tiny polyps only seen and removed. Gatha Mayer, MD 03/18/2021 3:26:15 PM This report has been signed electronically.

## 2021-03-18 NOTE — Patient Instructions (Addendum)
No signs of cancer.  I did remove 3 tiny polyps and saw diverticulosis. Diverticulosis is thickened muscle rings and pouches in the colon wall. Please read the handout about this condition.   I do not think you will need another routine colonoscopy.  I will let you know pathology results.   I appreciate the opportunity to care for you. Gatha Mayer, MD, [facility]  Handout provided on polyps and diverticulosis.   YOU HAD AN ENDOSCOPIC PROCEDURE TODAY AT [facility]:   Refer to the procedure report that was given to you for any specific questions about what was found during the examination.  If the procedure report does not answer your questions, please call your gastroenterologist to clarify.  If you requested that your care partner not be given the details of your procedure findings, then the procedure report has been included in a sealed envelope for you to review at your convenience later.  YOU SHOULD EXPECT: Some feelings of bloating in the abdomen. Passage of more gas than usual.  Walking can help get rid of the air that was put into your GI tract during the procedure and reduce the bloating. If you had a lower endoscopy (such as a colonoscopy or flexible sigmoidoscopy) you may notice spotting of blood in your stool or on the toilet paper. If you underwent a bowel prep for your procedure, you may not have a normal bowel movement for a few days.  Please Note:  You might notice some irritation and congestion in your nose or some drainage.  This is from the oxygen used during your procedure.  There is no need for concern and it should clear up in a day or so.  SYMPTOMS TO REPORT IMMEDIATELY:   Following lower endoscopy (colonoscopy or flexible sigmoidoscopy):  Excessive amounts of blood in the stool  Significant tenderness or worsening of abdominal pains  Swelling of the abdomen that is new, acute  Fever of 100F or higher  For urgent or emergent issues, a  gastroenterologist can be reached at any hour by calling [phone]. Do not use MyChart messaging for urgent concerns.    DIET:  We do recommend a small meal at first, but then you may proceed to your regular diet.  Drink plenty of fluids but you should avoid alcoholic beverages for 24 hours.  ACTIVITY:  You should plan to take it easy for the rest of today and you should NOT DRIVE or use heavy machinery until tomorrow (because of the sedation medicines used during the test).    FOLLOW UP: Our staff will call the number listed on your records 48-72 hours following your procedure to check on you and address any questions or concerns that you may have regarding the information given to you following your procedure. If we do not reach you, we will leave a message.  We will attempt to reach you two times.  During this call, we will ask if you have developed any symptoms of COVID 19. If you develop any symptoms (ie: fever, flu-like symptoms, shortness of breath, cough etc.) before then, please call [phone].  If you test positive for Covid 19 in the 2 weeks post procedure, please call and report this information to [place].    If any biopsies were taken you will be contacted by phone or by letter within the next 1-3 weeks.  Please call us at [phone] if you have not heard about the biopsies in 3 weeks.  SIGNATURES/CONFIDENTIALITY: You and/or your care partner have signed paperwork which will be entered into your electronic medical record.  These signatures attest to the fact that that the information above on your After Visit Summary has been reviewed and is understood.  Full responsibility of the confidentiality of this discharge information lies with you and/or your care-partner.

## 2021-03-20 ENCOUNTER — Telehealth: Payer: Self-pay

## 2021-03-20 ENCOUNTER — Other Ambulatory Visit: Payer: Self-pay | Admitting: Internal Medicine

## 2021-03-20 NOTE — Telephone Encounter (Signed)
  Follow up Call-  Call back number 03/18/2021 10/04/2019  Post procedure Call Back phone  # [phone]  Permission to leave phone message Yes Yes  Some recent data might be hidden     Patient questions:  Do you have a fever, pain , or abdominal swelling? No. Pain Score  0 *  Have you tolerated food without any problems? Yes.    Have you been able to return to your normal activities? Yes.    Do you have any questions about your discharge instructions: Diet   No. Medications  No. Follow up visit  No.  Do you have questions or concerns about your Care? No.  Actions: * If pain score is 4 or above: No action needed, pain <4.  1. Have you developed a fever since your procedure? no  2.   Have you had an respiratory symptoms (SOB or cough) since your procedure? no  3.   Have you tested positive for COVID 19 since your procedure no  4.   Have you had any family members/close contacts diagnosed with the COVID 19 since your procedure?  no   If yes to any of these questions please route to Joylene John, RN and Joella Prince, RN

## 2021-03-29 ENCOUNTER — Other Ambulatory Visit: Payer: Self-pay | Admitting: Internal Medicine

## 2021-04-01 ENCOUNTER — Encounter: Payer: Self-pay | Admitting: Internal Medicine

## 2021-04-14 ENCOUNTER — Other Ambulatory Visit: Payer: Self-pay | Admitting: Internal Medicine

## 2021-04-18 ENCOUNTER — Other Ambulatory Visit: Payer: Self-pay | Admitting: Internal Medicine

## 2021-05-07 ENCOUNTER — Other Ambulatory Visit: Payer: Self-pay | Admitting: Hematology

## 2021-05-07 ENCOUNTER — Other Ambulatory Visit: Payer: Self-pay | Admitting: Internal Medicine

## 2021-05-07 DIAGNOSIS — F419 Anxiety disorder, unspecified: Secondary | ICD-10-CM

## 2021-05-07 DIAGNOSIS — E782 Mixed hyperlipidemia: Secondary | ICD-10-CM

## 2021-05-29 DIAGNOSIS — Z1231 Encounter for screening mammogram for malignant neoplasm of breast: Secondary | ICD-10-CM | POA: Diagnosis not present

## 2021-05-31 DIAGNOSIS — H04123 Dry eye syndrome of bilateral lacrimal glands: Secondary | ICD-10-CM | POA: Diagnosis not present

## 2021-05-31 DIAGNOSIS — H02054 Trichiasis without entropian left upper eyelid: Secondary | ICD-10-CM | POA: Diagnosis not present

## 2021-05-31 DIAGNOSIS — H3562 Retinal hemorrhage, left eye: Secondary | ICD-10-CM | POA: Diagnosis not present

## 2021-06-05 ENCOUNTER — Encounter: Payer: Self-pay | Admitting: Internal Medicine

## 2021-06-05 ENCOUNTER — Ambulatory Visit (INDEPENDENT_AMBULATORY_CARE_PROVIDER_SITE_OTHER): Payer: Medicare PPO | Admitting: Internal Medicine

## 2021-06-05 ENCOUNTER — Other Ambulatory Visit: Payer: Self-pay

## 2021-06-05 VITALS — BP 156/64 | HR 88 | Temp 98.9°F | Resp 18 | Ht 62.0 in | Wt 182.2 lb

## 2021-06-05 DIAGNOSIS — H359 Unspecified retinal disorder: Secondary | ICD-10-CM | POA: Diagnosis not present

## 2021-06-05 DIAGNOSIS — I10 Essential (primary) hypertension: Secondary | ICD-10-CM | POA: Diagnosis not present

## 2021-06-05 DIAGNOSIS — E782 Mixed hyperlipidemia: Secondary | ICD-10-CM | POA: Diagnosis not present

## 2021-06-05 DIAGNOSIS — D649 Anemia, unspecified: Secondary | ICD-10-CM

## 2021-06-05 LAB — CBC WITH DIFFERENTIAL/PLATELET
Basophils Absolute: 0 10*3/uL (ref 0.0–0.1)
Basophils Relative: 1 % (ref 0.0–3.0)
Eosinophils Absolute: 0.3 10*3/uL (ref 0.0–0.7)
Eosinophils Relative: 5.3 % — ABNORMAL HIGH (ref 0.0–5.0)
HCT: 39.6 % (ref 36.0–46.0)
Hemoglobin: 13.2 g/dL (ref 12.0–15.0)
Lymphocytes Relative: 29.4 % (ref 12.0–46.0)
Lymphs Abs: 1.5 10*3/uL (ref 0.7–4.0)
MCHC: 33.2 g/dL (ref 30.0–36.0)
MCV: 95.3 fl (ref 78.0–100.0)
Monocytes Absolute: 0.4 10*3/uL (ref 0.1–1.0)
Monocytes Relative: 7.4 % (ref 3.0–12.0)
Neutro Abs: 2.9 10*3/uL (ref 1.4–7.7)
Neutrophils Relative %: 56.9 % (ref 43.0–77.0)
Platelets: 222 10*3/uL (ref 150.0–400.0)
RBC: 4.16 Mil/uL (ref 3.87–5.11)
RDW: 14 % (ref 11.5–15.5)
WBC: 5 10*3/uL (ref 4.0–10.5)

## 2021-06-05 LAB — COMPREHENSIVE METABOLIC PANEL
ALT: 11 U/L (ref 0–35)
AST: 19 U/L (ref 0–37)
Albumin: 4.5 g/dL (ref 3.5–5.2)
Alkaline Phosphatase: 163 U/L — ABNORMAL HIGH (ref 39–117)
BUN: 10 mg/dL (ref 6–23)
CO2: 26 mEq/L (ref 19–32)
Calcium: 9.8 mg/dL (ref 8.4–10.5)
Chloride: 108 mEq/L (ref 96–112)
Creatinine, Ser: 0.82 mg/dL (ref 0.40–1.20)
GFR: 64.83 mL/min (ref >60.00)
Glucose, Bld: 106 mg/dL — ABNORMAL HIGH (ref 70–99)
Potassium: 3.7 mEq/L (ref 3.5–5.1)
Sodium: 142 mEq/L (ref 135–145)
Total Bilirubin: 0.6 mg/dL (ref 0.2–1.2)
Total Protein: 7.9 g/dL (ref 6.0–8.3)

## 2021-06-05 LAB — LIPID PANEL
Cholesterol: 149 mg/dL (ref 0–200)
HDL: 51.5 mg/dL (ref >39.00)
LDL Cholesterol: 85 mg/dL (ref 0–99)
NonHDL: 97.5
Total CHOL/HDL Ratio: 3
Triglycerides: 62 mg/dL (ref 0.0–149.0)
VLDL: 12.4 mg/dL (ref 0.0–40.0)

## 2021-06-05 LAB — HEMOGLOBIN A1C: Hgb A1c MFr Bld: 5.8 % (ref 4.6–6.5)

## 2021-06-05 NOTE — Progress Notes (Signed)
   Subjective:   Patient ID: Jamie [name], female    DOB: September 07, 1933, 87 y.o.   MRN: [id number]  HPI The patent is an 87 YO female coming in for concerns about new problem with her retina. They told her it could be from blood pressure or sugars. She is checking BP at home 120s/70s. Wants to make sure she is doing well with health.   Review of Systems  Constitutional: Negative.   HENT: Negative.    Eyes: Negative.   Respiratory:  Negative for cough, chest tightness and shortness of breath.   Cardiovascular:  Negative for chest pain, palpitations and leg swelling.  Gastrointestinal:  Negative for abdominal distention, abdominal pain, constipation, diarrhea, nausea and vomiting.  Musculoskeletal: Negative.   Skin: Negative.   Neurological: Negative.   Psychiatric/Behavioral: Negative.     Objective:  Physical Exam Constitutional:      Appearance: She is well-developed.  HENT:     Head: Normocephalic and atraumatic.  Cardiovascular:     Rate and Rhythm: Normal rate and regular rhythm.  Pulmonary:     Effort: Pulmonary effort is normal. No respiratory distress.     Breath sounds: Normal breath sounds. No wheezing or rales.  Abdominal:     General: Bowel sounds are normal. There is no distension.     Palpations: Abdomen is soft.     Tenderness: There is no abdominal tenderness. There is no rebound.  Musculoskeletal:     Cervical back: Normal range of motion.  Skin:    General: Skin is warm and dry.  Neurological:     Mental Status: She is alert and oriented to person, place, and time.     Coordination: Coordination normal.    Vitals:   06/05/21 0820  BP: (!) 160/70  Pulse: 88  Resp: 18  Temp: 98.9 F (37.2 C)  TempSrc: Oral  SpO2: 99%  Weight: 182 lb 3.2 oz (82.6 kg)  Height: 5\' 2"  (1.575 m)    This visit occurred during the SARS-CoV-2 public health emergency.  Safety protocols were in place, including screening questions prior to the visit, additional usage of  staff PPE, and extensive cleaning of exam room while observing appropriate contact time as indicated for disinfecting solutions.   Assessment & Plan:

## 2021-06-05 NOTE — Patient Instructions (Signed)
We will check the labs today to check for the cholesterol, kidneys, liver, sugars, and blood counts.  I would recommend to see a dentist if you can to check the teeth and gums.

## 2021-06-07 ENCOUNTER — Encounter: Payer: Self-pay | Admitting: Internal Medicine

## 2021-06-07 DIAGNOSIS — H359 Unspecified retinal disorder: Secondary | ICD-10-CM | POA: Insufficient documentation

## 2021-06-07 NOTE — Assessment & Plan Note (Signed)
Checking HgA1c, home BP readings at goal. Adjust as needed.

## 2021-06-07 NOTE — Assessment & Plan Note (Signed)
Checking CBC to ensure stability.

## 2021-06-07 NOTE — Assessment & Plan Note (Signed)
BP at goal for home readings on amlodpine 5 mg daily and lasix 40 mg daily and benicar 40 mg daily. Checking CMP and CBC today.

## 2021-06-07 NOTE — Assessment & Plan Note (Signed)
Checking lipid panel and adjust lipitor 20 mg daily.  

## 2021-06-17 ENCOUNTER — Inpatient Hospital Stay: Payer: Medicare PPO

## 2021-06-17 ENCOUNTER — Inpatient Hospital Stay: Payer: Medicare PPO | Attending: Hematology | Admitting: Hematology

## 2021-06-17 ENCOUNTER — Encounter: Payer: Self-pay | Admitting: Hematology

## 2021-06-17 ENCOUNTER — Other Ambulatory Visit: Payer: Self-pay

## 2021-06-17 VITALS — BP 131/61 | HR 83 | Temp 98.8°F | Resp 18 | Ht 62.0 in | Wt 182.4 lb

## 2021-06-17 DIAGNOSIS — Z86711 Personal history of pulmonary embolism: Secondary | ICD-10-CM | POA: Insufficient documentation

## 2021-06-17 DIAGNOSIS — E876 Hypokalemia: Secondary | ICD-10-CM | POA: Diagnosis not present

## 2021-06-17 DIAGNOSIS — I700 Atherosclerosis of aorta: Secondary | ICD-10-CM | POA: Insufficient documentation

## 2021-06-17 DIAGNOSIS — R5383 Other fatigue: Secondary | ICD-10-CM | POA: Diagnosis not present

## 2021-06-17 DIAGNOSIS — C180 Malignant neoplasm of cecum: Secondary | ICD-10-CM | POA: Insufficient documentation

## 2021-06-17 DIAGNOSIS — I10 Essential (primary) hypertension: Secondary | ICD-10-CM | POA: Diagnosis not present

## 2021-06-17 DIAGNOSIS — K5730 Diverticulosis of large intestine without perforation or abscess without bleeding: Secondary | ICD-10-CM | POA: Insufficient documentation

## 2021-06-17 DIAGNOSIS — Z7951 Long term (current) use of inhaled steroids: Secondary | ICD-10-CM | POA: Diagnosis not present

## 2021-06-17 DIAGNOSIS — K621 Rectal polyp: Secondary | ICD-10-CM | POA: Diagnosis not present

## 2021-06-17 DIAGNOSIS — Z90722 Acquired absence of ovaries, bilateral: Secondary | ICD-10-CM | POA: Diagnosis not present

## 2021-06-17 DIAGNOSIS — H905 Unspecified sensorineural hearing loss: Secondary | ICD-10-CM | POA: Diagnosis not present

## 2021-06-17 DIAGNOSIS — K5900 Constipation, unspecified: Secondary | ICD-10-CM | POA: Diagnosis not present

## 2021-06-17 DIAGNOSIS — M1990 Unspecified osteoarthritis, unspecified site: Secondary | ICD-10-CM | POA: Diagnosis not present

## 2021-06-17 DIAGNOSIS — Z886 Allergy status to analgesic agent status: Secondary | ICD-10-CM | POA: Diagnosis not present

## 2021-06-17 DIAGNOSIS — Z8719 Personal history of other diseases of the digestive system: Secondary | ICD-10-CM | POA: Diagnosis not present

## 2021-06-17 DIAGNOSIS — K219 Gastro-esophageal reflux disease without esophagitis: Secondary | ICD-10-CM | POA: Insufficient documentation

## 2021-06-17 DIAGNOSIS — E785 Hyperlipidemia, unspecified: Secondary | ICD-10-CM | POA: Insufficient documentation

## 2021-06-17 DIAGNOSIS — Z9049 Acquired absence of other specified parts of digestive tract: Secondary | ICD-10-CM | POA: Insufficient documentation

## 2021-06-17 DIAGNOSIS — Z79899 Other long term (current) drug therapy: Secondary | ICD-10-CM | POA: Diagnosis not present

## 2021-06-17 LAB — CMP (CANCER CENTER ONLY)
ALT: 11 U/L (ref 0–44)
AST: 16 U/L (ref 15–41)
Albumin: 4.1 g/dL (ref 3.5–5.0)
Alkaline Phosphatase: 177 U/L — ABNORMAL HIGH (ref 38–126)
Anion gap: 10 (ref 5–15)
BUN: 10 mg/dL (ref 8–23)
CO2: 24 mmol/L (ref 22–32)
Calcium: 9.5 mg/dL (ref 8.9–10.3)
Chloride: 108 mmol/L (ref 98–111)
Creatinine: 0.97 mg/dL (ref 0.44–1.00)
GFR, Estimated: 57 mL/min — ABNORMAL LOW (ref >60)
Glucose, Bld: 123 mg/dL — ABNORMAL HIGH (ref 70–99)
Potassium: 3.9 mmol/L (ref 3.5–5.1)
Sodium: 142 mmol/L (ref 135–145)
Total Bilirubin: 0.6 mg/dL (ref 0.3–1.2)
Total Protein: 8.3 g/dL — ABNORMAL HIGH (ref 6.5–8.1)

## 2021-06-17 LAB — CBC WITH DIFFERENTIAL (CANCER CENTER ONLY)
Abs Immature Granulocytes: 0.01 10*3/uL (ref 0.00–0.07)
Basophils Absolute: 0 10*3/uL (ref 0.0–0.1)
Basophils Relative: 0 %
Eosinophils Absolute: 0.3 10*3/uL (ref 0.0–0.5)
Eosinophils Relative: 5 %
HCT: 39.8 % (ref 36.0–46.0)
Hemoglobin: 13.2 g/dL (ref 12.0–15.0)
Immature Granulocytes: 0 %
Lymphocytes Relative: 26 %
Lymphs Abs: 1.5 10*3/uL (ref 0.7–4.0)
MCH: 32 pg (ref 26.0–34.0)
MCHC: 33.2 g/dL (ref 30.0–36.0)
MCV: 96.6 fL (ref 80.0–100.0)
Monocytes Absolute: 0.4 10*3/uL (ref 0.1–1.0)
Monocytes Relative: 7 %
Neutro Abs: 3.6 10*3/uL (ref 1.7–7.7)
Neutrophils Relative %: 62 %
Platelet Count: 227 10*3/uL (ref 150–400)
RBC: 4.12 MIL/uL (ref 3.87–5.11)
RDW: 13.6 % (ref 11.5–15.5)
WBC Count: 5.8 10*3/uL (ref 4.0–10.5)
nRBC: 0 % (ref 0.0–0.2)

## 2021-06-17 LAB — CEA (IN HOUSE-CHCC): CEA (CHCC-In House): 2.75 ng/mL (ref 0.00–5.00)

## 2021-06-17 NOTE — Progress Notes (Signed)
[place]   [phone] [phone]   Clinic Follow up Note   Patient Care Team: Hoyt Koch, MD as PCP - General (Internal Medicine) Karl Luke, MD as Referring Physician (Optometry) Dema Severin, Sharon Mt, MD as Consulting Physician (General Surgery) Truitt Merle, MD as Consulting Physician (Hematology) Gatha Mayer, MD as Consulting Physician (Gastroenterology)  Date of Service:  06/17/2021  CHIEF COMPLAINT: f/u of right colon cancer  SUMMARY OF ONCOLOGIC HISTORY: Oncology History Overview Note  Cancer Staging Adenocarcinoma of cecum [facility]) Staging form: Colon and Rectum, AJCC 8th Edition - Clinical: Stage Unknown (cTX, cN0, cM0) - Signed by Truitt Merle, MD on 10/17/2019 - Pathologic stage from 12/15/2019: Stage IIA (pT3, pN0, cM0) - Signed by Truitt Merle, MD on 01/14/2020     Adenocarcinoma of cecum ([place])  09/26/2019 Imaging   CT AP W Contrast  IMPRESSION: Large cecal mass is noted concerning for malignancy. Colonoscopy is recommended for further evaluation. These results will be called to the ordering clinician or representative by the Radiologist Assistant, and communication documented in the PACS or zVision Dashboard.   Aortic Atherosclerosis (ICD10-I70.0).   09/28/2019 Initial Diagnosis   Adenocarcinoma of cecum ([place])    10/04/2019 Pathology Results   Cecal mass biopsy showed adenocarcinoma   10/04/2019 Procedure   -Malignant tumor in the cecum. Biopsied. - Diverticulosis in the sigmoid colon. - The examination was otherwise normal   10/04/2019 Miscellaneous   MMR showed loss of MLH1 and PMS2 expression    10/10/2019 Imaging   CT chest W contrast  IMPRESSION: 1. Small volume nonocclusive pulmonary embolus identified in segmental left upper lobe pulmonary artery and subsegmental pulmonary artery to the left lower lobe. Left lower lobe embolus has a string like configuration suggesting that these findings may  well be subacute to chronic. Probable trace nonobstructive embolus in a subsegmental artery to the right lower lobe. 2. No evidence for metastatic disease in the thorax. 3.  Aortic Atherosclerois (ICD10-170.0)   10/17/2019 Cancer Staging   Staging form: Colon and Rectum, AJCC 8th Edition - Clinical: Stage Unknown (cTX, cN0, cM0) - Signed by Truitt Merle, MD on 10/17/2019    12/15/2019 Surgery   LAPAROSCOPIC RIGHT HEMI COLECTOMY WITH LYSIS OF ADHESIONS by Dr. Dema Severin     12/15/2019 Pathology Results   FINAL MICROSCOPIC DIAGNOSIS:   A. COLON, RIGHT, RESECTION:  -  Adenocarcinoma, moderately differentiated, 7 cm  -  Carcinoma invades through muscularis propria into pericolorectal  tissue  -  No carcinoma identified in nineteen (0/19)  -  Margins uninvolved by carcinoma  -  Appendix uninvolved by carcinoma  -  Tubular adenoma, incidental (0.3 cm)  -  See oncology table and comment below    12/15/2019 Cancer Staging   Staging form: Colon and Rectum, AJCC 8th Edition - Pathologic stage from 12/15/2019: Stage IIA (pT3, pN0, cM0) - Signed by Truitt Merle, MD on 01/14/2020    11/15/2020 Imaging   CT CAP  IMPRESSION: No evidence of recurrent or metastatic carcinoma within the chest, abdomen, or pelvis.   Colonic diverticulosis. No radiographic evidence of diverticulitis.   Aortic and coronary atherosclerotic calcification.   03/18/2021 Procedure   Colonoscopy, surveillance  Impression: - Diverticulosis in the sigmoid colon. - Three diminutive polyps in the rectum and in the sigmoid colon, removed with a cold biopsy forceps. Resected and retrieved. - Patent end-to-side ileo-colonic anastomosis, characterized by healthy appearing mucosa.   03/18/2021 Pathology Results   Diagnosis 1. Surgical [P], colon, sigmoid,  polyp (1) - POLYPOID, INFLAMED GRANULATION TISSUE. - NO ADENOMATOUS CHANGE OR CARCINOMA. 2. Surgical [P], colon, rectum, polyp (2) - HYPERPLASTIC POLYP (TWO) - NO ADENOMATOUS  CHANGE OR CARCINOMA.      CURRENT THERAPY:  Surveillance  INTERVAL HISTORY:  Jamie [name] is here for a follow up of colon cancer. She was last seen by me on 02/15/21. She presents to the clinic alone. Her daughter is waiting in the lobby. She denies any stomach issues. She notes some constipation.She uses senokot as needed. She notes her appetite is good, but she is having some dental issues. She reports her bottom gums are bleeding. She is scheduled for follow up with her dentist on Thursday, 06/20/21.   All other systems were reviewed with the patient and are negative.  MEDICAL HISTORY:  Past Medical History:  Diagnosis Date   Adenocarcinoma of cecum ([place]) 09/28/2019   ANEMIA, IRON DEFICIENCY 09/10/2007   Anxiety    Arthritis    Asthma    Blood transfusion without reported diagnosis    Cataract 2008   bilateral cataract extraction   CATARACT EXTRACTION, HX OF 09/10/2007   DYSPEPSIA, HX OF 09/10/2007   GERD (gastroesophageal reflux disease)    HAIR LOSS 03/21/2009   HAY FEVER 09/10/2007   HEARING LOSS, SENSORINEURAL 09/10/2007   HYPERLIPIDEMIA 09/10/2007   HYPERTENSION 09/10/2007   LOW BACK PAIN 05/10/2008   Pain in joint, hand 03/01/2008   PEPTIC ULCER DISEASE 09/10/2007   POLYP, COLON 09/10/2007   POLYPECTOMY, HX OF 09/10/2007   Pulmonary embolism ([place]) 1- 2 non-occlusive 10/10/2019   SHINGLES 08/13/2009   SHOULDER PAIN, LEFT 02/12/2008   TAH/BSO, HX OF 09/10/2007   Tuberculosis    PATIENT DENIES AT PHONE INTERVIEW ON 12/12/2019    SURGICAL HISTORY: Past Surgical History:  Procedure Laterality Date   ABDOMINAL HYSTERECTOMY     BREAST SURGERY     breast biopsy-benign   CATARACT EXTRACTION     COLONOSCOPY     EYE SURGERY     bilateral cataract with lens implant   IRRIGATION AND DEBRIDEMENT SEBACEOUS CYST     LAPAROSCOPIC RIGHT HEMI COLECTOMY Right 12/15/2019   Procedure: LAPAROSCOPIC RIGHT HEMI COLECTOMY WITH LYSIS OF ADHESIONS;  Surgeon: Ileana Roup, MD;  Location: [facility];  Service: General;  Laterality: Right;   TOTAL ABDOMINAL HYSTERECTOMY W/ BILATERAL SALPINGOOPHORECTOMY      I have reviewed the social history and family history with the patient and they are unchanged from previous note.  ALLERGIES:  is allergic to aspirin and latex.  MEDICATIONS:  Current Outpatient Medications  Medication Sig Dispense Refill   acetaminophen (TYLENOL) 500 MG tablet Take 500-1,000 mg by mouth every 6 (six) hours as needed (for pain.).     albuterol (VENTOLIN HFA) 108 (90 Base) MCG/ACT inhaler INHALE 2 PUFFS INTO THE LUNGS EVERY 4 HOURS AS NEEDED FOR WHEEZING OR SHORTNESS OF BREATH. 25.5 each 2   ALPRAZolam (XANAX) 0.5 MG tablet TAKE 1 TABLET BY MOUTH EVERY DAY AS NEEDED FOR ANXIETY 20 tablet 0   alum & mag hydroxide-simeth (MAALOX/MYLANTA) 200-200-20 MG/5ML suspension Take 15 mLs by mouth every 6 (six) hours as needed for indigestion or heartburn.     amLODipine (NORVASC) 5 MG tablet TAKE 1 TABLET BY MOUTH EVERY DAY 90 tablet 1   atorvastatin (LIPITOR) 20 MG tablet TAKE 1 TABLET BY MOUTH EVERY DAY 90 tablet 1   BREO ELLIPTA 100-25 MCG/INH AEPB INHALE 1 PUFF BY MOUTH EVERY DAY 60 each 3  famotidine (PEPCID) 20 MG tablet TAKE 1 TABLET BY MOUTH TWICE A DAY 180 tablet 3   furosemide (LASIX) 40 MG tablet TAKE 1 TABLET BY MOUTH EVERY DAY 90 tablet 1   olmesartan (BENICAR) 40 MG tablet TAKE 1 TABLET BY MOUTH EVERY DAY 90 tablet 3   ondansetron (ZOFRAN-ODT) 4 MG disintegrating tablet TAKE 1 TABLET BY MOUTH EVERY 8 HOURS AS NEEDED FOR NAUSEA AND VOMITING 60 tablet 0   sennosides-docusate sodium (SENOKOT-S) 8.6-50 MG tablet Take 1 tablet by mouth daily as needed for constipation.     triamcinolone cream (KENALOG) 0.1 % APPLY TOPICALLY 2 (TWO) TIMES DAILY AS NEEDED (SKIN IRRITATION/RASH). 90 g 0   No current facility-administered medications for this visit.    PHYSICAL EXAMINATION: ECOG PERFORMANCE STATUS: 1 - Symptomatic but completely  ambulatory  Vitals:   06/17/21 1039  BP: 131/61  Pulse: 83  Resp: 18  Temp: 98.8 F (37.1 C)  SpO2: 100%   Filed Weights   06/17/21 1039  Weight: 182 lb 6.4 oz (82.7 kg)    GENERAL:alert, no distress and comfortable SKIN: skin color, texture, turgor are normal, no rashes or significant lesions EYES: normal, Conjunctiva are pink and non-injected, sclera clear NECK: supple, thyroid normal size, non-tender, without nodularity LYMPH:  no palpable lymphadenopathy in the cervical, axillary  LUNGS: normal breathing effort, wheezing present HEART: regular rate & rhythm and no murmurs and no lower extremity edema Musculoskeletal:no cyanosis of digits and no clubbing  NEURO: alert & oriented x 3 with fluent speech, no focal motor/sensory deficits  LABORATORY DATA:  I have reviewed the data as listed CBC Latest Ref Rng & Units 06/17/2021 06/05/2021 02/26/2021  WBC 4.0 - 10.5 K/uL 5.8 5.0 5.4  Hemoglobin 12.0 - 15.0 g/dL 13.2 13.2 13.9  Hematocrit 36.0 - 46.0 % 39.8 39.6 41.7  Platelets 150 - 400 K/uL 227 222.0 230.0     CMP Latest Ref Rng & Units 06/17/2021 06/05/2021 02/26/2021  Glucose 70 - 99 mg/dL 123(H) 106(H) 93  BUN 8 - 23 mg/dL _0 Creatinine 0.44 - 1.00 mg/dL 0.97 0.82 0.82  Sodium 135 - 145 mmol/L 142 142 141  Potassium 3.5 - 5.1 mmol/L 3.9 3.7 3.7  Chloride 98 - 111 mmol/L 108 108 106  CO2 22 - 32 mmol/L _1 Calcium 8.9 - 10.3 mg/dL 9.5 9.8 9.8  Total Protein 6.5 - 8.1 g/dL 8.3(H) 7.9 8.3  Total Bilirubin 0.3 - 1.2 mg/dL 0.6 0.6 0.6  Alkaline Phos 38 - 126 U/L 177(H) 163(H) 179(H)  AST 15 - 41 U/L _2 ALT 0 - 44 U/L _3 RADIOGRAPHIC STUDIES: I have personally reviewed the radiological images as listed and agreed with the findings in the report. No results found.   ASSESSMENT & PLAN:  MAGAN WINNETT is a 87 y.o. female with   1. Right colon cancer, pT3N0M0, stage IIA, MSI-H -She was diagnosed in 10/2019. She has biopsy-proven cecal  adenocarcinoma, staging CT scan was negative for distant metastasis. -She underwent curative right hemicolectomy with Dr. Dema Severin on 12/15/19. Her path showed complete surgical resection of 7cm tumor and negative 19 LNs were removed. -Her MMR results show her cancer has MLH1 and PMS2 loss, and MLH hypermethylation positive, supports sporadic cancer, not Lynch syndrome.   -Given her early stage cancer with complete surgical resection, I did not recommend adjuvant chemotherapy. She is currently on surveillance.  -CT CAP 11/15/20 was negative  for recurrence.  -Surveillance colonoscopy on 03/18/21 was also negative. Recall not indicated due to age. -Labs reviewed, CBC WNL, CMP and CEA pending. Physical exam benign. There is no clinical concern for recurrence. -Will continue 5 year surveillance plan.  -f/u in 5 months with labs and restaging CT prior to visit   2. Fatigue -She notes in the past 3 months having increased fatigue and not being able to walk for as long. -I discussed this could be normal aging process. She plans to f/u with PCP on 3/31. I recommend she check her Thyroid level.  -She also has asthma, on inhaler. Wheezing noted on physical exam today.    3. LUL pulmonary embolism -Seen on 10/2019 CT chest, she was asymptomatic. Lower extremely Doppler was negative for DVT -She was treated with Eliquis 2.19m for 4-5 months, off now    4.  HTN, GERD, Arthritis  -Continue to f/u with PCP, BP not well controlled  -She has arthritis mainly in knees and hands -She ambulates with walker and cane.    5. Hypokalemia  -If she continues Lasix she may need to continue oral potassium.       PLAN:  -f/u in 5 months, with labs and CT a few days prior    No problem-specific Assessment & Plan notes found for this encounter.   Orders Placed This Encounter  Procedures   CT CHEST ABDOMEN PELVIS W CONTRAST    Standing Status:   Future    Standing Expiration Date:   06/17/2022    Order Specific  Question:   If indicated for the ordered procedure, I authorize the administration of contrast media per Radiology protocol    Answer:   Yes    Order Specific Question:   [facility]?    [facility]   Order Specific Question:   Release to patient    Answer:   Immediate    Order Specific Question:   Is Oral Contrast requested for this exam?    Answer:   Yes, Per Radiology protocol    Order Specific Question:   Reason for Exam (SYMPTOM  OR DIAGNOSIS REQUIRED)    Answer:   rule out recurrence of colon cancer    All questions were answered. The patient knows to call the clinic with any problems, questions or concerns. No barriers to learning was detected. The total time spent in the appointment was 30 minutes.     YTruitt Merle MD 06/17/2021   I, KWilburn Mylar am acting as scribe for YTruitt Merle MD.   I have reviewed the above documentation for accuracy and completeness, and I agree with the above.

## 2021-07-29 ENCOUNTER — Other Ambulatory Visit: Payer: Self-pay | Admitting: Internal Medicine

## 2021-08-05 ENCOUNTER — Other Ambulatory Visit: Payer: Self-pay

## 2021-08-05 ENCOUNTER — Emergency Department (HOSPITAL_COMMUNITY): Payer: Medicare PPO

## 2021-08-05 ENCOUNTER — Emergency Department (HOSPITAL_COMMUNITY)
Admission: EM | Admit: 2021-08-05 | Discharge: 2021-08-05 | Disposition: A | Discharge status: Home / Self Care | Payer: Medicare PPO | Attending: Emergency Medicine | Admitting: Emergency Medicine

## 2021-08-05 DIAGNOSIS — R059 Cough, unspecified: Secondary | ICD-10-CM | POA: Insufficient documentation

## 2021-08-05 DIAGNOSIS — Z87891 Personal history of nicotine dependence: Secondary | ICD-10-CM | POA: Diagnosis not present

## 2021-08-05 DIAGNOSIS — J4530 Mild persistent asthma, uncomplicated: Secondary | ICD-10-CM | POA: Diagnosis not present

## 2021-08-05 DIAGNOSIS — R110 Nausea: Secondary | ICD-10-CM | POA: Insufficient documentation

## 2021-08-05 DIAGNOSIS — Z79899 Other long term (current) drug therapy: Secondary | ICD-10-CM | POA: Insufficient documentation

## 2021-08-05 DIAGNOSIS — Z91040 Latex allergy status: Secondary | ICD-10-CM | POA: Diagnosis not present

## 2021-08-05 DIAGNOSIS — Z20822 Contact with and (suspected) exposure to covid-19: Secondary | ICD-10-CM | POA: Insufficient documentation

## 2021-08-05 DIAGNOSIS — J9811 Atelectasis: Secondary | ICD-10-CM | POA: Diagnosis not present

## 2021-08-05 DIAGNOSIS — I10 Essential (primary) hypertension: Secondary | ICD-10-CM | POA: Diagnosis not present

## 2021-08-05 DIAGNOSIS — J069 Acute upper respiratory infection, unspecified: Secondary | ICD-10-CM | POA: Diagnosis not present

## 2021-08-05 DIAGNOSIS — B9789 Other viral agents as the cause of diseases classified elsewhere: Secondary | ICD-10-CM | POA: Diagnosis not present

## 2021-08-05 DIAGNOSIS — J479 Bronchiectasis, uncomplicated: Secondary | ICD-10-CM | POA: Diagnosis not present

## 2021-08-05 LAB — URINALYSIS, ROUTINE W REFLEX MICROSCOPIC
Bacteria, UA: NONE SEEN
Bilirubin Urine: NEGATIVE
Glucose, UA: NEGATIVE mg/dL
Ketones, ur: NEGATIVE mg/dL
Leukocytes,Ua: NEGATIVE
Nitrite: NEGATIVE
Protein, ur: NEGATIVE mg/dL
Specific Gravity, Urine: 1.006 (ref 1.005–1.030)
pH: 7 (ref 5.0–8.0)

## 2021-08-05 LAB — COMPREHENSIVE METABOLIC PANEL
ALT: 11 U/L (ref 0–44)
AST: 17 U/L (ref 15–41)
Albumin: 3.8 g/dL (ref 3.5–5.0)
Alkaline Phosphatase: 152 U/L — ABNORMAL HIGH (ref 38–126)
Anion gap: 9 (ref 5–15)
BUN: 7 mg/dL — ABNORMAL LOW (ref 8–23)
CO2: 23 mmol/L (ref 22–32)
Calcium: 8.8 mg/dL — ABNORMAL LOW (ref 8.9–10.3)
Chloride: 104 mmol/L (ref 98–111)
Creatinine, Ser: 0.83 mg/dL (ref 0.44–1.00)
GFR, Estimated: >60 mL/min (ref >60)
Glucose, Bld: 147 mg/dL — ABNORMAL HIGH (ref 70–99)
Potassium: 3.3 mmol/L — ABNORMAL LOW (ref 3.5–5.1)
Sodium: 136 mmol/L (ref 135–145)
Total Bilirubin: 0.7 mg/dL (ref 0.3–1.2)
Total Protein: 7.2 g/dL (ref 6.5–8.1)

## 2021-08-05 LAB — CBC
HCT: 38.9 % (ref 36.0–46.0)
Hemoglobin: 12.7 g/dL (ref 12.0–15.0)
MCH: 31.8 pg (ref 26.0–34.0)
MCHC: 32.6 g/dL (ref 30.0–36.0)
MCV: 97.5 fL (ref 80.0–100.0)
Platelets: 218 10*3/uL (ref 150–400)
RBC: 3.99 MIL/uL (ref 3.87–5.11)
RDW: 13.5 % (ref 11.5–15.5)
WBC: 8.1 10*3/uL (ref 4.0–10.5)
nRBC: 0 % (ref 0.0–0.2)

## 2021-08-05 LAB — RESP PANEL BY RT-PCR (FLU A&B, COVID) ARPGX2
Influenza A by PCR: NEGATIVE
Influenza B by PCR: NEGATIVE
SARS Coronavirus 2 by RT PCR: NEGATIVE

## 2021-08-05 LAB — LIPASE, BLOOD: Lipase: 25 U/L (ref 11–51)

## 2021-08-05 NOTE — ED Notes (Signed)
Pt able to ambulate with assistance to RR

## 2021-08-05 NOTE — ED Notes (Signed)
Unable to obtain signature at time of discharge as no signature pad was available for hall bed. Pt verbalized understanding of discharge instructions and denied additional questions or concerns.

## 2021-08-05 NOTE — ED Triage Notes (Signed)
Pt reports dry cough since last night and this morning developed nausea without vomiting or abdominal pain. Denies sick contacts.

## 2021-08-05 NOTE — Discharge Instructions (Addendum)
You were seen in the ED today for your cough. Your physical exam, vital signs, and test results are very reassuring.  No sign of pneumonia or any emergent problem with your heart.  You likely have another acute viral illness.  You may use over-the-counter medications as needed.  Please follow-up with your primary care doctor and return to the ER for any other new severe symptoms.

## 2021-08-05 NOTE — ED Provider Notes (Signed)
[name] Provider Note   CSN: [id number] Arrival date & time: 08/05/21  1058     History Chief Complaint  Patient presents with   Cough    Jamie [name] is a 87 y.o. female who presents with concern for dry cough that started last night and developed into mildly productive cough with thick yellow sputum and associated nausea today.  Denies any abdominal pain, vomiting, diarrhea, dysuria, urinary frequency or urgency.  Denies any known sick contacts.  Is vaccinated x3 against COVID-19.  I personally read this patient medical records.  She is history of bronchitis and asthma, as well as hyperlipidemia and GERD.  Patient states her symptoms yesterday and today improved with her inhalers at home.  She is not anticoagulated.   HPI     Past Medical History:  Diagnosis Date   Adenocarcinoma of cecum ([place]) 09/28/2019   ANEMIA, IRON DEFICIENCY 09/10/2007   Anxiety    Arthritis    Asthma    Blood transfusion without reported diagnosis    Cataract 2008   bilateral cataract extraction   CATARACT EXTRACTION, HX OF 09/10/2007   DYSPEPSIA, HX OF 09/10/2007   GERD (gastroesophageal reflux disease)    HAIR LOSS 03/21/2009   HAY FEVER 09/10/2007   HEARING LOSS, SENSORINEURAL 09/10/2007   HYPERLIPIDEMIA 09/10/2007   HYPERTENSION 09/10/2007   LOW BACK PAIN 05/10/2008   Pain in joint, hand 03/01/2008   PEPTIC ULCER DISEASE 09/10/2007   POLYP, COLON 09/10/2007   POLYPECTOMY, HX OF 09/10/2007   Pulmonary embolism ([place]) 1- 2 non-occlusive 10/10/2019   SHINGLES 08/13/2009   SHOULDER PAIN, LEFT 02/12/2008   TAH/BSO, HX OF 09/10/2007   Tuberculosis    PATIENT DENIES AT PHONE [place] ON 12/12/2019    Patient Active Problem List   Diagnosis Date Noted   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   Acute focal neurological deficit 02/26/2021   Adenocarcinoma of cecum ([place]) 09/28/2019   Normocytic anemia 09/28/2019   Mild persistent asthma 04/09/2018    Degenerative arthritis of left knee 05/26/2017   Degenerative tear of left medial meniscus 05/01/2017   Anxiety 01/08/2017   Arthritis of carpometacarpal (CMC) joints of both thumbs 06/22/2014   Routine health maintenance 08/09/2011   Alopecia 03/21/2009   Low back pain 05/10/2008   Hyperlipidemia 09/10/2007   Essential hypertension 09/10/2007   GERD (gastroesophageal reflux disease) 09/10/2007    Past Surgical History:  Procedure Laterality Date   ABDOMINAL HYSTERECTOMY     BREAST SURGERY     breast biopsy-benign   CATARACT EXTRACTION     COLONOSCOPY     EYE SURGERY     bilateral cataract with lens implant   IRRIGATION AND DEBRIDEMENT SEBACEOUS CYST     LAPAROSCOPIC RIGHT HEMI COLECTOMY Right 12/15/2019   Procedure: LAPAROSCOPIC RIGHT HEMI COLECTOMY WITH LYSIS OF ADHESIONS;  Surgeon: Ileana Roup, MD;  Location: [facility];  Service: General;  Laterality: Right;   TOTAL ABDOMINAL HYSTERECTOMY W/ BILATERAL SALPINGOOPHORECTOMY       OB History   No obstetric history on file.     Family History  Problem Relation Age of Onset   Hypertension Mother    Coronary artery disease Mother    Heart disease Mother        CHF   Cancer Son 71       prostate cancer    Cancer Son 60       prostate cancer   Diabetes Neg Hx     Social History  Tobacco Use   Smoking status: Former    Packs/day: 0.20    Years: 25.00    Pack years: 5.00    Types: Cigarettes    Quit date: 12/01/1989    Years since quitting: 31.6   Smokeless tobacco: Never   Tobacco comments:    smoked a long time ago; Quit 90 or 89/ number is approximate  Vaping Use   Vaping Use: Never used  Substance Use Topics   Alcohol use: No    Alcohol/week: 0.0 standard drinks   Drug use: No    Home Medications Prior to Admission medications   Medication Sig Start Date End Date Taking? Authorizing Provider  acetaminophen (TYLENOL) 500 MG tablet Take 500-1,000 mg by mouth every 6 (six) hours as needed (for pain.).     [provider]  albuterol (VENTOLIN HFA) 108 (90 Base) MCG/ACT inhaler INHALE 2 PUFFS INTO THE LUNGS EVERY 4 HOURS AS NEEDED FOR WHEEZING OR SHORTNESS OF BREATH. 04/19/21   Hoyt Koch, MD  ALPRAZolam Duanne Moron) 0.5 MG tablet TAKE 1 TABLET BY MOUTH EVERY DAY AS NEEDED FOR ANXIETY 05/08/21   Hoyt Koch, MD  alum & mag hydroxide-simeth (MAALOX/MYLANTA) 200-200-20 MG/5ML suspension Take 15 mLs by mouth every 6 (six) hours as needed for indigestion or heartburn.    [provider]  amLODipine (NORVASC) 5 MG tablet TAKE 1 TABLET BY MOUTH EVERY DAY 03/20/21   Hoyt Koch, MD  atorvastatin (LIPITOR) 20 MG tablet TAKE 1 TABLET BY MOUTH EVERY DAY 05/08/21   Hoyt Koch, MD  BREO ELLIPTA 100-25 MCG/INH AEPB INHALE 1 PUFF BY MOUTH EVERY DAY 07/29/21   Hoyt Koch, MD  famotidine (PEPCID) 20 MG tablet TAKE 1 TABLET BY MOUTH TWICE A DAY 05/08/21   Hoyt Koch, MD  furosemide (LASIX) 40 MG tablet TAKE 1 TABLET BY MOUTH EVERY DAY 03/20/21   Hoyt Koch, MD  olmesartan (BENICAR) 40 MG tablet TAKE 1 TABLET BY MOUTH EVERY DAY 03/20/21   Hoyt Koch, MD  ondansetron (ZOFRAN-ODT) 4 MG disintegrating tablet TAKE 1 TABLET BY MOUTH EVERY 8 HOURS AS NEEDED FOR NAUSEA AND VOMITING 05/08/21   Hoyt Koch, MD  sennosides-docusate sodium (SENOKOT-S) 8.6-50 MG tablet Take 1 tablet by mouth daily as needed for constipation.    [provider]  triamcinolone cream (KENALOG) 0.1 % APPLY TOPICALLY 2 (TWO) TIMES DAILY AS NEEDED (SKIN IRRITATION/RASH). 04/15/21   Hoyt Koch, MD    Allergies    Aspirin and Latex  Review of Systems   Review of Systems  Constitutional:  Positive for activity change, appetite change, chills and diaphoresis. Negative for fatigue and fever.  HENT:  Positive for congestion. Negative for tinnitus, trouble swallowing and voice change.   Eyes: Negative.   Respiratory:  Positive for cough.  Negative for chest tightness and shortness of breath.   Cardiovascular: Negative.   Gastrointestinal:  Positive for nausea. Negative for abdominal distention, abdominal pain, anal bleeding, blood in stool, constipation, diarrhea and vomiting.  Genitourinary: Negative.   Musculoskeletal: Negative.   Skin: Negative.   Neurological:  Positive for headaches. Negative for dizziness, weakness and light-headedness.   Physical Exam Updated Vital Signs BP (!) 127/53 (BP Location: Left Arm)   Pulse 73   Temp 98.5 F (36.9 C) (Oral)   Resp 18   SpO2 99%   Physical Exam Vitals and nursing note reviewed.  Constitutional:      Appearance: She is obese. She is not  ill-appearing or toxic-appearing.  HENT:     Head: Normocephalic and atraumatic.     Nose: Nose normal. No congestion.     Mouth/Throat:     Mouth: Mucous membranes are moist.     Pharynx: Oropharynx is clear. Uvula midline. No oropharyngeal exudate or posterior oropharyngeal erythema.     Tonsils: No tonsillar exudate.  Eyes:     General: Lids are normal. Vision grossly intact.        Right eye: No discharge.        Left eye: No discharge.     Extraocular Movements: Extraocular movements intact.     Conjunctiva/sclera: Conjunctivae normal.     Pupils: Pupils are equal, round, and reactive to light.  Neck:     Trachea: Trachea and phonation normal.  Cardiovascular:     Rate and Rhythm: Normal rate and regular rhythm.     Pulses: Normal pulses.     Heart sounds: Normal heart sounds. No murmur heard. Pulmonary:     Effort: Pulmonary effort is normal. No tachypnea, bradypnea, accessory muscle usage, prolonged expiration or respiratory distress.     Breath sounds: Normal breath sounds. No wheezing or rales.  Chest:     Chest wall: No mass, lacerations, deformity, swelling, tenderness, crepitus or edema.  Abdominal:     General: Bowel sounds are normal. There is no distension.     Palpations: Abdomen is soft.     Tenderness:  There is no abdominal tenderness. There is no right CVA tenderness, left CVA tenderness, guarding or rebound.  Musculoskeletal:        General: No deformity.     Cervical back: Normal range of motion and neck supple. No edema, rigidity or crepitus. No pain with movement, spinous process tenderness or muscular tenderness.     Right lower leg: No edema.     Left lower leg: No edema.  Lymphadenopathy:     Cervical: No cervical adenopathy.  Skin:    General: Skin is warm and dry.     Capillary Refill: Capillary refill takes less than 2 seconds.     Findings: No rash.  Neurological:     General: No focal deficit present.     Mental Status: She is alert and oriented to person, place, and time. Mental status is at baseline.     GCS: GCS eye subscore is 4. GCS verbal subscore is 5. GCS motor subscore is 6.     Sensory: Sensation is intact.     Motor: Motor function is intact.     Gait: Gait is intact.  Psychiatric:        Mood and Affect: Mood normal.    ED Results / Procedures / Treatments   Labs (all labs ordered are listed, but only abnormal results are displayed) Labs Reviewed  COMPREHENSIVE METABOLIC PANEL - Abnormal; Notable for the following components:      Result Value   Potassium 3.3 (*)    Glucose, Bld 147 (*)    BUN 7 (*)    Calcium 8.8 (*)    Alkaline Phosphatase 152 (*)    All other components within normal limits  URINALYSIS, ROUTINE W REFLEX MICROSCOPIC - Abnormal; Notable for the following components:   Color, Urine COLORLESS (*)    Hgb urine dipstick SMALL (*)    All other components within normal limits  RESP PANEL BY RT-PCR (FLU A&B, COVID) [id number]  LIPASE, BLOOD  CBC    EKG EKG Interpretation  Date/Time:  Monday August 05 2021 13:27:28 EDT Ventricular Rate:  73 PR Interval:  161 QRS Duration: 87 QT Interval:  409 QTC Calculation: 451 R Axis:   30 Text Interpretation: Sinus rhythm Atrial premature complex Probable left atrial enlargement Low  voltage, precordial leads normal axis No acute ischemia Confirmed by Lorre Munroe ([id number]) on 08/05/2021 1:48:57 PM  Radiology DG Chest 2 View  Result Date: 08/05/2021 CLINICAL DATA:  Cough EXAM: CHEST - 2 VIEW COMPARISON:  Chest radiograph dated 07/16/2017 and CT chest dated 11/15/2020. FINDINGS: The heart size is normal. Vascular calcifications are seen in the aortic arch. There is mild left basilar atelectasis. There is lower lung predominant bronchiectasis which may reflect chronic aspiration. The right lung is clear. There is no pleural effusion or pneumothorax. Degenerative changes are seen in the spine. IMPRESSION: Mild left basilar atelectasis. Aortic Atherosclerosis (ICD10-I70.0). Electronically Signed   By: Zerita Boers M.D.   On: 08/05/2021 11:32    Procedures Procedures   Medications Ordered in ED Medications - No data to display  ED Course  I have reviewed the triage vital signs and the nursing notes.  Pertinent labs & imaging results that were available during my care of the patient were reviewed by me and considered in my medical decision making (see chart for details).    MDM Rules/Calculators/A&P                         87 year old female who presents with concern for 24 hours of cough, and Nausea this morning.   Ddx includes but is not limited to  ACS, pneumonia, pleural effusion, PE, COVID-19, other viral illness.   VS normal on intake. Cardiopulmonary exam is normal, abdominal exam is benign. Patient is neurovascularly intact in all 4 extremities.   CBC unremarkable, CMP with mild hypokalemia of 3.3. UA unremarkable. DG Chest with mild left basilar atelectasis, but without infiltrate. EKG reassuring.   Respiratory pathogen panel pending at this time.  Respiratory pathogen panel negative for COVID-19 and influenza.  No further work-up warranted needed this time given reassuring physical exam and vital signs.  Patient is very relieved she does not have COVID.  Ann  voiced understanding of her medical evaluation treatment plan.  Each of her questions was answered to her expressed satisfaction.  Return precautions are given.  Patient is well-appearing, stable, and appropriate for discharge at this time.  This chart was dictated using voice recognition software, Dragon. Despite the best efforts of this provider to proofread and correct errors, errors may still occur which can change documentation meaning.  Final Clinical Impression(s) / ED Diagnoses Final diagnoses:  None    Rx / DC Orders ED Discharge Orders     None        Aura Dials 08/05/21 1540    Arnaldo Natal, MD 08/06/21 [phone]

## 2021-08-05 NOTE — ED Notes (Signed)
Pt transported to xray 

## 2021-08-08 ENCOUNTER — Other Ambulatory Visit: Payer: Self-pay

## 2021-08-09 ENCOUNTER — Ambulatory Visit: Payer: Medicare PPO | Admitting: Internal Medicine

## 2021-08-09 ENCOUNTER — Encounter: Payer: Self-pay | Admitting: Internal Medicine

## 2021-08-09 VITALS — BP 134/70 | HR 75 | Temp 98.5°F | Resp 18 | Ht 62.0 in | Wt 176.4 lb

## 2021-08-09 DIAGNOSIS — J4531 Mild persistent asthma with (acute) exacerbation: Secondary | ICD-10-CM

## 2021-08-09 DIAGNOSIS — M79602 Pain in left arm: Secondary | ICD-10-CM | POA: Diagnosis not present

## 2021-08-09 DIAGNOSIS — Z23 Encounter for immunization: Secondary | ICD-10-CM | POA: Diagnosis not present

## 2021-08-09 MED ORDER — BENZONATATE 200 MG PO CAPS: 200.0000 mg | ORAL_CAPSULE | Freq: Three times a day (TID) | ORAL | 0 refills | Status: DC | PRN

## 2021-08-09 MED ORDER — PREDNISONE 20 MG PO TABS: 40.0000 mg | ORAL_TABLET | Freq: Every day | ORAL | 0 refills | Status: DC

## 2021-08-09 MED ORDER — MONTELUKAST SODIUM 10 MG PO TABS: 10.0000 mg | ORAL_TABLET | Freq: Every day | ORAL | 3 refills | Status: DC

## 2021-08-09 NOTE — Assessment & Plan Note (Addendum)
With flare today. Rx prednisone and advised to start singulair which is prescribed today to help with drainage. She is still taking breo daily and advised to continue and use albuterol prn.

## 2021-08-09 NOTE — Patient Instructions (Addendum)
We have sent in singulair to take 1 pill in the evening to help with the drainage.  We have sent in a cough medicine to use as needed up to 3 times a day called tessalon perles.   We have sent in the prednisone to take 2 pills daily for 5 days

## 2021-08-09 NOTE — Assessment & Plan Note (Signed)
Rx prednisone 5 day course to see if this helps. She could have muscular component since the pain is in the scapular region as well as AC tenderness. No significant arthritis noted on recent CXR which was reviewed with her. If no improvement will refer to sports medicine for evaluation and treatment.

## 2021-08-09 NOTE — Progress Notes (Signed)
   Subjective:   Patient ID: Jamie [name], female    DOB: [birth year], 87 y.o.   MRN: [id number]  HPI The patient is an 87 YO female coming in for left arm pain and f/u ER for viral URI. Still some wheezing and coughing but no SOB. Not taking anything for congestion as she did not know what to take otc and ER did not do any rx.   Review of Systems  Constitutional: Negative.   HENT:  Positive for congestion and postnasal drip.   Eyes: Negative.   Respiratory:  Positive for cough and wheezing. Negative for chest tightness and shortness of breath.   Cardiovascular:  Negative for chest pain, palpitations and leg swelling.  Gastrointestinal:  Negative for abdominal distention, abdominal pain, constipation, diarrhea, nausea and vomiting.  Musculoskeletal:  Positive for arthralgias and myalgias.  Skin: Negative.   Neurological: Negative.   Psychiatric/Behavioral: Negative.     Objective:  Physical Exam Constitutional:      Appearance: She is well-developed.  HENT:     Head: Normocephalic and atraumatic.  Cardiovascular:     Rate and Rhythm: Normal rate and regular rhythm.  Pulmonary:     Effort: Pulmonary effort is normal. No respiratory distress.     Breath sounds: Wheezing present. No rales.  Abdominal:     General: Bowel sounds are normal. There is no distension.     Palpations: Abdomen is soft.     Tenderness: There is no abdominal tenderness. There is no rebound.  Musculoskeletal:        General: Tenderness present.     Cervical back: Normal range of motion.     Comments: Pain left AC joint and left scapular region to touch, limited ROM due to pain  Skin:    General: Skin is warm and dry.  Neurological:     Mental Status: She is alert and oriented to person, place, and time.     Coordination: Coordination normal.    Vitals:   08/09/21 0907  BP: 134/70  Pulse: 75  Resp: 18  Temp: 98.5 F (36.9 C)  TempSrc: Oral  SpO2: 97%  Weight: 176 lb 6.4 oz (80 kg)   Height: '5\' 2"'$  (1.575 m)    This visit occurred during the SARS-CoV-2 public health emergency.  Safety protocols were in place, including screening questions prior to the visit, additional usage of staff PPE, and extensive cleaning of exam room while observing appropriate contact time as indicated for disinfecting solutions.   Assessment & Plan:  Flu shot given at visit

## 2021-09-09 ENCOUNTER — Other Ambulatory Visit: Payer: Self-pay | Admitting: Internal Medicine

## 2021-09-09 ENCOUNTER — Other Ambulatory Visit: Payer: Self-pay | Admitting: Hematology

## 2021-09-09 DIAGNOSIS — F419 Anxiety disorder, unspecified: Secondary | ICD-10-CM

## 2021-09-09 DIAGNOSIS — E782 Mixed hyperlipidemia: Secondary | ICD-10-CM

## 2021-09-16 DIAGNOSIS — H3562 Retinal hemorrhage, left eye: Secondary | ICD-10-CM | POA: Diagnosis not present

## 2021-09-16 DIAGNOSIS — H35033 Hypertensive retinopathy, bilateral: Secondary | ICD-10-CM | POA: Diagnosis not present

## 2021-09-23 ENCOUNTER — Other Ambulatory Visit: Payer: Self-pay | Admitting: Hematology

## 2021-11-13 ENCOUNTER — Other Ambulatory Visit: Payer: Self-pay

## 2021-11-13 ENCOUNTER — Inpatient Hospital Stay: Payer: Medicare PPO | Attending: Hematology

## 2021-11-13 ENCOUNTER — Ambulatory Visit (HOSPITAL_COMMUNITY)
Admission: RE | Admit: 2021-11-13 | Discharge: 2021-11-13 | Disposition: A | Discharge status: Home / Self Care | Payer: Medicare PPO | Source: Ambulatory Visit | Attending: Hematology | Admitting: Hematology

## 2021-11-13 DIAGNOSIS — C180 Malignant neoplasm of cecum: Secondary | ICD-10-CM | POA: Insufficient documentation

## 2021-11-13 DIAGNOSIS — M170 Bilateral primary osteoarthritis of knee: Secondary | ICD-10-CM | POA: Insufficient documentation

## 2021-11-13 DIAGNOSIS — I2510 Atherosclerotic heart disease of native coronary artery without angina pectoris: Secondary | ICD-10-CM | POA: Diagnosis not present

## 2021-11-13 DIAGNOSIS — Z85038 Personal history of other malignant neoplasm of large intestine: Secondary | ICD-10-CM | POA: Diagnosis not present

## 2021-11-13 DIAGNOSIS — K5730 Diverticulosis of large intestine without perforation or abscess without bleeding: Secondary | ICD-10-CM | POA: Diagnosis not present

## 2021-11-13 DIAGNOSIS — N281 Cyst of kidney, acquired: Secondary | ICD-10-CM | POA: Diagnosis not present

## 2021-11-13 DIAGNOSIS — Z90710 Acquired absence of both cervix and uterus: Secondary | ICD-10-CM | POA: Insufficient documentation

## 2021-11-13 DIAGNOSIS — M47814 Spondylosis without myelopathy or radiculopathy, thoracic region: Secondary | ICD-10-CM | POA: Diagnosis not present

## 2021-11-13 DIAGNOSIS — M4316 Spondylolisthesis, lumbar region: Secondary | ICD-10-CM | POA: Diagnosis not present

## 2021-11-13 DIAGNOSIS — Z86711 Personal history of pulmonary embolism: Secondary | ICD-10-CM | POA: Insufficient documentation

## 2021-11-13 DIAGNOSIS — K7689 Other specified diseases of liver: Secondary | ICD-10-CM | POA: Diagnosis not present

## 2021-11-13 DIAGNOSIS — K219 Gastro-esophageal reflux disease without esophagitis: Secondary | ICD-10-CM | POA: Insufficient documentation

## 2021-11-13 DIAGNOSIS — E876 Hypokalemia: Secondary | ICD-10-CM | POA: Insufficient documentation

## 2021-11-13 DIAGNOSIS — I10 Essential (primary) hypertension: Secondary | ICD-10-CM | POA: Insufficient documentation

## 2021-11-13 LAB — CMP (CANCER CENTER ONLY)
ALT: 11 U/L (ref 0–44)
AST: 15 U/L (ref 15–41)
Albumin: 4.1 g/dL (ref 3.5–5.0)
Alkaline Phosphatase: 194 U/L — ABNORMAL HIGH (ref 38–126)
Anion gap: 10 (ref 5–15)
BUN: 13 mg/dL (ref 8–23)
CO2: 23 mmol/L (ref 22–32)
Calcium: 9.2 mg/dL (ref 8.9–10.3)
Chloride: 109 mmol/L (ref 98–111)
Creatinine: 1.01 mg/dL — ABNORMAL HIGH (ref 0.44–1.00)
GFR, Estimated: 54 mL/min — ABNORMAL LOW (ref >60)
Glucose, Bld: 90 mg/dL (ref 70–99)
Potassium: 4 mmol/L (ref 3.5–5.1)
Sodium: 142 mmol/L (ref 135–145)
Total Bilirubin: 0.5 mg/dL (ref 0.3–1.2)
Total Protein: 8.1 g/dL (ref 6.5–8.1)

## 2021-11-13 LAB — CBC WITH DIFFERENTIAL (CANCER CENTER ONLY)
Abs Immature Granulocytes: 0.02 10*3/uL (ref 0.00–0.07)
Basophils Absolute: 0 10*3/uL (ref 0.0–0.1)
Basophils Relative: 1 %
Eosinophils Absolute: 0.2 10*3/uL (ref 0.0–0.5)
Eosinophils Relative: 3 %
HCT: 38.6 % (ref 36.0–46.0)
Hemoglobin: 12.8 g/dL (ref 12.0–15.0)
Immature Granulocytes: 0 %
Lymphocytes Relative: 25 %
Lymphs Abs: 1.8 10*3/uL (ref 0.7–4.0)
MCH: 31.8 pg (ref 26.0–34.0)
MCHC: 33.2 g/dL (ref 30.0–36.0)
MCV: 95.8 fL (ref 80.0–100.0)
Monocytes Absolute: 0.7 10*3/uL (ref 0.1–1.0)
Monocytes Relative: 9 %
Neutro Abs: 4.3 10*3/uL (ref 1.7–7.7)
Neutrophils Relative %: 62 %
Platelet Count: 230 10*3/uL (ref 150–400)
RBC: 4.03 MIL/uL (ref 3.87–5.11)
RDW: 13.4 % (ref 11.5–15.5)
WBC Count: 7 10*3/uL (ref 4.0–10.5)
nRBC: 0 % (ref 0.0–0.2)

## 2021-11-13 MED ORDER — IOHEXOL 350 MG/ML SOLN
80.0000 mL | Freq: Once | INTRAVENOUS | Status: AC | PRN
  Administered 2021-11-13: 17:00:00 80 mL via INTRAVENOUS

## 2021-11-14 LAB — CEA (IN HOUSE-CHCC): CEA (CHCC-In House): 2.79 ng/mL (ref 0.00–5.00)

## 2021-11-15 ENCOUNTER — Encounter: Payer: Self-pay | Admitting: Hematology

## 2021-11-15 ENCOUNTER — Other Ambulatory Visit: Payer: Self-pay

## 2021-11-15 ENCOUNTER — Inpatient Hospital Stay (HOSPITAL_BASED_OUTPATIENT_CLINIC_OR_DEPARTMENT_OTHER): Payer: Medicare PPO | Admitting: Hematology

## 2021-11-15 VITALS — BP 141/53 | HR 84 | Temp 98.5°F | Resp 18 | Ht 62.0 in | Wt 180.7 lb

## 2021-11-15 DIAGNOSIS — M170 Bilateral primary osteoarthritis of knee: Secondary | ICD-10-CM | POA: Diagnosis not present

## 2021-11-15 DIAGNOSIS — Z90710 Acquired absence of both cervix and uterus: Secondary | ICD-10-CM | POA: Diagnosis not present

## 2021-11-15 DIAGNOSIS — I10 Essential (primary) hypertension: Secondary | ICD-10-CM | POA: Diagnosis not present

## 2021-11-15 DIAGNOSIS — K219 Gastro-esophageal reflux disease without esophagitis: Secondary | ICD-10-CM | POA: Diagnosis not present

## 2021-11-15 DIAGNOSIS — Z86711 Personal history of pulmonary embolism: Secondary | ICD-10-CM | POA: Diagnosis not present

## 2021-11-15 DIAGNOSIS — C180 Malignant neoplasm of cecum: Secondary | ICD-10-CM | POA: Diagnosis not present

## 2021-11-15 DIAGNOSIS — E876 Hypokalemia: Secondary | ICD-10-CM | POA: Diagnosis not present

## 2021-11-15 NOTE — Progress Notes (Signed)
[facility]   Telephone:([phone]) [phone] Fax:(336) [phone]   Clinic Follow up Note   Patient Care Team: Hoyt Koch, MD as PCP - General (Internal Medicine) Karl Luke, MD as Referring Physician (Optometry) Dema Severin Sharon Mt, MD as Consulting Physician (General Surgery) Truitt Merle, MD as Consulting Physician (Hematology) Gatha Mayer, MD as Consulting Physician (Gastroenterology)  Date of Service:  11/15/2021  CHIEF COMPLAINT: f/u of right colon cancer  CURRENT THERAPY:  Surveillance  ASSESSMENT & PLAN:  Jamie [name] is a 87 y.o. female with   1. Right colon cancer, pT3N0M0, stage IIA, MSI-H -She was diagnosed in 10/2019. She has biopsy-proven cecal adenocarcinoma, staging CT scan was negative for distant metastasis. -She underwent curative right hemicolectomy with Dr. Dema Severin on 12/15/19. Her path showed complete surgical resection of 7cm tumor and negative 19 LNs were removed. -Her MMR results show her cancer has MLH1 and PMS2 loss, and MLH hypermethylation positive, supports sporadic cancer, not Lynch syndrome.   -Given her early stage cancer with complete surgical resection, I did not recommend adjuvant chemotherapy. She is currently on surveillance.  -Surveillance colonoscopy on 03/18/21 was also negative. Recall not indicated due to age. -restaging CT CAP 11/13/21 showed NED. I reviewed with her today, and she became tearful. -Labs from 12/14 reviewed, WNL except Cr 1.01 and alk phos 194. She is clinically doing well. There is no clinical concern for recurrence. -Will continue 5 year surveillance plan.    2. LUL pulmonary embolism -Seen on 10/2019 CT chest, she was asymptomatic. Lower extremely Doppler was negative for DVT -She was treated with Eliquis 2.$RemoveBefore'5mg'BwSKeLjhSyejJ$  for 4-5 months, off now    3. HTN, GERD, Arthritis  -Continue to f/u with PCP, BP not well controlled  -She has arthritis mainly in knees and hands -She ambulates with walker and  cane.    4. Hypokalemia  -If she continues Lasix she may need to continue oral potassium.       PLAN:  -lab and scan reviewed, NED -lab and f/u in 6 months   No problem-specific Assessment & Plan notes found for this encounter.   SUMMARY OF ONCOLOGIC HISTORY: Oncology History Overview Note  Cancer Staging Adenocarcinoma of cecum ([place]) Staging form: Colon and Rectum, AJCC 8th Edition - Clinical: Stage Unknown (cTX, cN0, cM0) - Signed by Truitt Merle, MD on 10/17/2019 - Pathologic stage from 12/15/2019: Stage IIA (pT3, pN0, cM0) - Signed by Truitt Merle, MD on 01/14/2020    Adenocarcinoma of cecum ([place])  09/26/2019 Imaging   CT AP W Contrast  IMPRESSION: Large cecal mass is noted concerning for malignancy. Colonoscopy is recommended for further evaluation. These results will be called to the ordering clinician or representative by the Radiologist Assistant, and communication documented in the PACS or zVision Dashboard.   Aortic Atherosclerosis (ICD10-I70.0).   09/28/2019 Initial Diagnosis   Adenocarcinoma of cecum ([place])   10/04/2019 Pathology Results   Cecal mass biopsy showed adenocarcinoma   10/04/2019 Procedure   -Malignant tumor in the cecum. Biopsied. - Diverticulosis in the sigmoid colon. - The examination was otherwise normal   10/04/2019 Miscellaneous   MMR showed loss of MLH1 and PMS2 expression    10/10/2019 Imaging   CT chest W contrast  IMPRESSION: 1. Small volume nonocclusive pulmonary embolus identified in segmental left upper lobe pulmonary artery and subsegmental pulmonary artery to the left lower lobe. Left lower lobe embolus has a string like configuration suggesting that these findings may well be subacute to chronic. Probable  trace nonobstructive embolus in a subsegmental artery to the right lower lobe. 2. No evidence for metastatic disease in the thorax. 3.  Aortic Atherosclerois (ICD10-170.0)   10/17/2019 Cancer Staging   Staging form: Colon and  Rectum, AJCC 8th Edition - Clinical: Stage Unknown (cTX, cN0, cM0) - Signed by Truitt Merle, MD on 10/17/2019    12/15/2019 Surgery   LAPAROSCOPIC RIGHT HEMI COLECTOMY WITH LYSIS OF ADHESIONS by Dr. Dema Severin     12/15/2019 Pathology Results   FINAL MICROSCOPIC DIAGNOSIS:   A. COLON, RIGHT, RESECTION:  -  Adenocarcinoma, moderately differentiated, 7 cm  -  Carcinoma invades through muscularis propria into pericolorectal  tissue  -  No carcinoma identified in nineteen (0/19)  -  Margins uninvolved by carcinoma  -  Appendix uninvolved by carcinoma  -  Tubular adenoma, incidental (0.3 cm)  -  See oncology table and comment below    12/15/2019 Cancer Staging   Staging form: Colon and Rectum, AJCC 8th Edition - Pathologic stage from 12/15/2019: Stage IIA (pT3, pN0, cM0) - Signed by Truitt Merle, MD on 01/14/2020    11/15/2020 Imaging   CT CAP  IMPRESSION: No evidence of recurrent or metastatic carcinoma within the chest, abdomen, or pelvis.   Colonic diverticulosis. No radiographic evidence of diverticulitis.   Aortic and coronary atherosclerotic calcification.   03/18/2021 Procedure   Colonoscopy, surveillance  Impression: - Diverticulosis in the sigmoid colon. - Three diminutive polyps in the rectum and in the sigmoid colon, removed with a cold biopsy forceps. Resected and retrieved. - Patent end-to-side ileo-colonic anastomosis, characterized by healthy appearing mucosa.   03/18/2021 Pathology Results   Diagnosis 1. Surgical [P], colon, sigmoid, polyp (1) - POLYPOID, INFLAMED GRANULATION TISSUE. - NO ADENOMATOUS CHANGE OR CARCINOMA. 2. Surgical [P], colon, rectum, polyp (2) - HYPERPLASTIC POLYP (TWO) - NO ADENOMATOUS CHANGE OR CARCINOMA.   11/13/2021 Imaging   EXAM: CT CHEST, ABDOMEN, AND PELVIS WITH CONTRAST  IMPRESSION: 1. No evidence of recurrence or metastatic disease within the chest, abdomen, or pelvis. 2. Colonic diverticulosis without findings of acute  diverticulitis. 3. Three-vessel coronary artery disease. Please note that although the presence of coronary artery calcium documents the presence of coronary artery disease, the severity of this disease and any potential stenosis cannot be assessed on this non-gated CT examination. Assessment for potential risk factor modification, dietary therapy or pharmacologic therapy may be warranted, if clinically indicated 4.  Aortic Atherosclerosis (ICD10-I70.0).      INTERVAL HISTORY:  Jamie [name] is here for a follow up of colon cancer. She was last seen by me on 06/17/21. She presents to the clinic alone. She reports she is doing well overall with no new concerns.   All other systems were reviewed with the patient and are negative.  MEDICAL HISTORY:  Past Medical History:  Diagnosis Date   Adenocarcinoma of cecum ([place]) 09/28/2019   ANEMIA, IRON DEFICIENCY 09/10/2007   Anxiety    Arthritis    Asthma    Blood transfusion without reported diagnosis    Cataract 2008   bilateral cataract extraction   CATARACT EXTRACTION, HX OF 09/10/2007   DYSPEPSIA, HX OF 09/10/2007   GERD (gastroesophageal reflux disease)    HAIR LOSS 03/21/2009   HAY FEVER 09/10/2007   HEARING LOSS, SENSORINEURAL 09/10/2007   HYPERLIPIDEMIA 09/10/2007   HYPERTENSION 09/10/2007   LOW BACK PAIN 05/10/2008   Pain in joint, hand 03/01/2008   PEPTIC ULCER DISEASE 09/10/2007   POLYP, COLON 09/10/2007   POLYPECTOMY, HX OF  09/10/2007   Pulmonary embolism ([place]) 1- 2 non-occlusive 10/10/2019   SHINGLES 08/13/2009   SHOULDER PAIN, LEFT 02/12/2008   TAH/BSO, HX OF 09/10/2007   Tuberculosis    PATIENT DENIES AT PHONE INTERVIEW ON 12/12/2019    SURGICAL HISTORY: Past Surgical History:  Procedure Laterality Date   ABDOMINAL HYSTERECTOMY     BREAST SURGERY     breast biopsy-benign   CATARACT EXTRACTION     COLONOSCOPY     EYE SURGERY     bilateral cataract with lens implant   IRRIGATION AND DEBRIDEMENT SEBACEOUS  CYST     LAPAROSCOPIC RIGHT HEMI COLECTOMY Right 12/15/2019   Procedure: LAPAROSCOPIC RIGHT HEMI COLECTOMY WITH LYSIS OF ADHESIONS;  Surgeon: Ileana Roup, MD;  Location: [facility];  Service: General;  Laterality: Right;   TOTAL ABDOMINAL HYSTERECTOMY W/ BILATERAL SALPINGOOPHORECTOMY      I have reviewed the social history and family history with the patient and they are unchanged from previous note.  ALLERGIES:  is allergic to aspirin and latex.  MEDICATIONS:  Current Outpatient Medications  Medication Sig Dispense Refill   acetaminophen (TYLENOL) 500 MG tablet Take 500-1,000 mg by mouth every 6 (six) hours as needed (for pain.).     albuterol (VENTOLIN HFA) 108 (90 Base) MCG/ACT inhaler INHALE 2 PUFFS BY MOUTH EVERY 4 HOURS AS NEEDED FOR WHEEZE OR FOR SHORTNESS OF BREATH 25.5 each 2   ALPRAZolam (XANAX) 0.5 MG tablet TAKE 1 TABLET BY MOUTH EVERY DAY AS NEEDED FOR ANXIETY 20 tablet 0   alum & mag hydroxide-simeth (MAALOX/MYLANTA) 200-200-20 MG/5ML suspension Take 15 mLs by mouth every 6 (six) hours as needed for indigestion or heartburn.     amLODipine (NORVASC) 5 MG tablet TAKE 1 TABLET BY MOUTH EVERY DAY 90 tablet 1   atorvastatin (LIPITOR) 20 MG tablet TAKE 1 TABLET BY MOUTH EVERY DAY 90 tablet 1   benzonatate (TESSALON) 200 MG capsule TAKE 1 CAPSULE BY MOUTH THREE TIMES A DAY AS NEEDED 60 capsule 0   BREO ELLIPTA 100-25 MCG/INH AEPB INHALE 1 PUFF BY MOUTH EVERY DAY 60 each 3   famotidine (PEPCID) 20 MG tablet TAKE 1 TABLET BY MOUTH TWICE A DAY 180 tablet 3   furosemide (LASIX) 40 MG tablet TAKE 1 TABLET BY MOUTH EVERY DAY 90 tablet 1   montelukast (SINGULAIR) 10 MG tablet TAKE 1 TABLET BY MOUTH EVERYDAY AT BEDTIME 90 tablet 1   olmesartan (BENICAR) 40 MG tablet TAKE 1 TABLET BY MOUTH EVERY DAY 90 tablet 3   ondansetron (ZOFRAN-ODT) 4 MG disintegrating tablet TAKE 1 TABLET BY MOUTH EVERY 8 HOURS AS NEEDED FOR NAUSEA AND VOMITING 60 tablet 0   potassium chloride (KLOR-CON) 10 MEQ  tablet TAKE 1 TABLET BY MOUTH EVERY DAY 90 tablet 1   predniSONE (DELTASONE) 20 MG tablet Take 2 tablets (40 mg total) by mouth daily with breakfast. 10 tablet 0   sennosides-docusate sodium (SENOKOT-S) 8.6-50 MG tablet Take 1 tablet by mouth daily as needed for constipation.     triamcinolone cream (KENALOG) 0.1 % APPLY TOPICALLY 2 (TWO) TIMES DAILY AS NEEDED (SKIN IRRITATION/RASH). 90 g 0   No current facility-administered medications for this visit.    PHYSICAL EXAMINATION: ECOG PERFORMANCE STATUS: 1 - Symptomatic but completely ambulatory  Vitals:   11/15/21 1007  BP: (!) 141/53  Pulse: 84  Resp: 18  Temp: 98.5 F (36.9 C)  SpO2: 100%   Wt Readings from Last 3 Encounters:  11/15/21 180 lb 11.2 oz (82 kg)  08/09/21 176 lb 6.4 oz (80 kg)  06/17/21 182 lb 6.4 oz (82.7 kg)     GENERAL:alert, no distress and comfortable SKIN: skin color normal, no rashes or significant lesions EYES: normal, Conjunctiva are pink and non-injected, sclera clear  NEURO: alert & oriented x 3 with fluent speech  LABORATORY DATA:  I have reviewed the data as listed CBC Latest Ref Rng & Units 11/13/2021 08/05/2021 06/17/2021  WBC 4.0 - 10.5 K/uL 7.0 8.1 5.8  Hemoglobin 12.0 - 15.0 g/dL 12.8 12.7 13.2  Hematocrit 36.0 - 46.0 % 38.6 38.9 39.8  Platelets 150 - 400 K/uL 230 218 227     CMP Latest Ref Rng & Units 11/13/2021 08/05/2021 06/17/2021  Glucose 70 - 99 mg/dL 90 147(H) 123(H)  BUN 8 - 23 mg/dL 13 7(L) 10  Creatinine 0.44 - 1.00 mg/dL 1.01(H) 0.83 0.97  Sodium 135 - 145 mmol/L 142 136 142  Potassium 3.5 - 5.1 mmol/L 4.0 3.3(L) 3.9  Chloride 98 - 111 mmol/L 109 104 108  CO2 22 - 32 mmol/L $RemoveB'23 23 24  'ABjdPdEM$ Calcium 8.9 - 10.3 mg/dL 9.2 8.8(L) 9.5  Total Protein 6.5 - 8.1 g/dL 8.1 7.2 8.3(H)  Total Bilirubin 0.3 - 1.2 mg/dL 0.5 0.7 0.6  Alkaline Phos 38 - 126 U/L 194(H) 152(H) 177(H)  AST 15 - 41 U/L $Remo'15 17 16  'xERwu$ ALT 0 - 44 U/L $Remo'11 11 11      'xtHPN$ RADIOGRAPHIC STUDIES: I have personally reviewed the  radiological images as listed and agreed with the findings in the report. CT CHEST ABDOMEN PELVIS W CONTRAST  Result Date: 11/14/2021 CLINICAL DATA:  History of colon cancer, surveillance EXAM: CT CHEST, ABDOMEN, AND PELVIS WITH CONTRAST TECHNIQUE: Multidetector CT imaging of the chest, abdomen and pelvis was performed following the standard protocol during bolus administration of intravenous contrast. CONTRAST:  47mL OMNIPAQUE IOHEXOL 350 MG/ML SOLN COMPARISON:  Multiple priors including most recent CT November 15, 2020 FINDINGS: CT CHEST FINDINGS Cardiovascular: Aortic atherosclerosis without aneurysmal dilation. No central pulmonary embolus on this nondedicated study. Three-vessel coronary artery calcifications. Normal size heart. No significant pericardial effusion/thickening. Mediastinum/Nodes: No supraclavicular adenopathy. No discrete thyroid nodule. No pathologically enlarged mediastinal, hilar or axillary lymph nodes. The trachea and esophagus are grossly unremarkable. Lungs/Pleura: No suspicious pulmonary nodules or masses. No focal airspace consolidation. No pleural effusion. No pneumothorax. Musculoskeletal: Thoracic spondylosis. No aggressive lytic or blastic lesion of bone. CT ABDOMEN PELVIS FINDINGS Hepatobiliary: Stable bilobar hypodense hepatic cysts measuring up to 17 mm. No suspicious hepatic lesion. Gallbladder is decompressed. No biliary ductal dilation. Pancreas: No pancreatic ductal dilation or evidence of acute inflammation. Spleen: Within normal limits. Adrenals/Urinary Tract: Bilateral adrenal glands are unremarkable. No hydronephrosis. Bilateral renal cysts measuring up to 18 mm. No solid enhancing renal mass. Urinary bladder is unremarkable for degree of distension. Stomach/Bowel: Radiopaque enteric contrast material traverses the sigmoid colon. Stomach is unremarkable for degree of distension. No pathologic dilation of large or small bowel. Surgical change of partial right  hemicolectomy with anastomotic sutures in the right upper quadrant. No suspicious nodularity in the surgical bed to suggest recurrence. Colonic diverticulosis without findings of acute diverticulitis. Vascular/Lymphatic: Aortic and branch vessel atherosclerosis without aneurysmal dilation. No pathologically enlarged abdominal or pelvic lymph nodes. Reproductive: Status post hysterectomy. No adnexal masses. Other: Postsurgical change in the anterior abdominal wall. Anterior abdominal wall laxity. No significant abdominal or pelvic free fluid. No discrete peritoneal or omental nodularity. Musculoskeletal: No aggressive lytic or blastic lesion of bone. Multilevel degenerative changes  spine. Degenerative grade 1 L4 on L5 anterolisthesis. IMPRESSION: 1. No evidence of recurrence or metastatic disease within the chest, abdomen, or pelvis. 2. Colonic diverticulosis without findings of acute diverticulitis. 3. Three-vessel coronary artery disease. Please note that although the presence of coronary artery calcium documents the presence of coronary artery disease, the severity of this disease and any potential stenosis cannot be assessed on this non-gated CT examination. Assessment for potential risk factor modification, dietary therapy or pharmacologic therapy may be warranted, if clinically indicated 4.  Aortic Atherosclerosis (ICD10-I70.0). Electronically Signed   By: Dahlia Bailiff M.D.   On: 11/14/2021 16:12      No orders of the defined types were placed in this encounter.  All questions were answered. The patient knows to call the clinic with any problems, questions or concerns. No barriers to learning was detected. The total time spent in the appointment was 30 minutes.     Truitt Merle, MD 11/15/2021   I, Wilburn Mylar, am acting as scribe for Truitt Merle, MD.   I have reviewed the above documentation for accuracy and completeness, and I agree with the above.

## 2021-11-20 ENCOUNTER — Telehealth: Payer: Self-pay | Admitting: Hematology

## 2021-11-20 NOTE — Telephone Encounter (Signed)
Left message with follow-up appointment per 12/16 los.

## 2021-11-25 ENCOUNTER — Other Ambulatory Visit: Payer: Self-pay | Admitting: Internal Medicine

## 2021-11-29 ENCOUNTER — Other Ambulatory Visit: Payer: Self-pay

## 2021-11-29 ENCOUNTER — Encounter: Payer: Self-pay | Admitting: Internal Medicine

## 2021-11-29 ENCOUNTER — Ambulatory Visit: Payer: Medicare PPO | Admitting: Internal Medicine

## 2021-11-29 DIAGNOSIS — I700 Atherosclerosis of aorta: Secondary | ICD-10-CM

## 2021-11-29 NOTE — Progress Notes (Signed)
° °  Subjective:   Patient ID: Jamie [name], female    DOB: July 22, 1933, 87 y.o.   MRN: [id number]  HPI The patient is an 87 YO female coming in for discussion about aortic atherosclerosis and CAD on recent imaging.   Review of Systems  Constitutional: Negative.   HENT: Negative.    Eyes: Negative.   Respiratory:  Negative for cough, chest tightness and shortness of breath.   Cardiovascular:  Negative for chest pain, palpitations and leg swelling.  Gastrointestinal:  Negative for abdominal distention, abdominal pain, constipation, diarrhea, nausea and vomiting.  Musculoskeletal: Negative.   Skin: Negative.   Neurological: Negative.   Psychiatric/Behavioral: Negative.     Objective:  Physical Exam Constitutional:      Appearance: She is well-developed.  HENT:     Head: Normocephalic and atraumatic.  Cardiovascular:     Rate and Rhythm: Normal rate and regular rhythm.  Pulmonary:     Effort: Pulmonary effort is normal. No respiratory distress.     Breath sounds: Normal breath sounds. No wheezing or rales.  Abdominal:     General: Bowel sounds are normal. There is no distension.     Palpations: Abdomen is soft.     Tenderness: There is no abdominal tenderness. There is no rebound.  Musculoskeletal:     Cervical back: Normal range of motion.  Skin:    General: Skin is warm and dry.  Neurological:     Mental Status: She is alert and oriented to person, place, and time.     Coordination: Coordination normal.    Vitals:   11/29/21 0924  BP: 128/78  Pulse: 90  Resp: 18  SpO2: 100%  Weight: 179 lb 12.8 oz (81.6 kg)  Height: 5\' 2"  (1.575 m)    This visit occurred during the SARS-CoV-2 public health emergency.  Safety protocols were in place, including screening questions prior to the visit, additional usage of staff PPE, and extensive cleaning of exam room while observing appropriate contact time as indicated for disinfecting solutions.   Assessment & Plan:

## 2021-11-29 NOTE — Assessment & Plan Note (Signed)
She is taking lipitor 20 mg daily. Ordered CT calcium score to assess plaque burden. No anginal symptoms.

## 2021-11-29 NOTE — Patient Instructions (Signed)
We will order the CT calcium score to check the heart. If this is too expensive let us know and we can have you see a heart specialist (cardiologist) to check your heart.

## 2021-12-14 ENCOUNTER — Other Ambulatory Visit: Payer: Self-pay | Admitting: Internal Medicine

## 2022-01-13 DIAGNOSIS — Z85038 Personal history of other malignant neoplasm of large intestine: Secondary | ICD-10-CM | POA: Diagnosis not present

## 2022-02-04 ENCOUNTER — Other Ambulatory Visit: Payer: Self-pay

## 2022-02-04 ENCOUNTER — Encounter: Payer: Self-pay | Admitting: Internal Medicine

## 2022-02-04 ENCOUNTER — Ambulatory Visit (INDEPENDENT_AMBULATORY_CARE_PROVIDER_SITE_OTHER): Payer: Medicare PPO | Admitting: Internal Medicine

## 2022-02-04 VITALS — BP 130/60 | HR 82 | Temp 98.9°F | Ht 63.0 in | Wt 176.0 lb

## 2022-02-04 DIAGNOSIS — I700 Atherosclerosis of aorta: Secondary | ICD-10-CM | POA: Diagnosis not present

## 2022-02-04 DIAGNOSIS — R21 Rash and other nonspecific skin eruption: Secondary | ICD-10-CM

## 2022-02-04 MED ORDER — METHYLPREDNISOLONE ACETATE 40 MG/ML IJ SUSP
40.0000 mg | Freq: Once | INTRAMUSCULAR | Status: AC
  Administered 2022-02-04: 40 mg via INTRAMUSCULAR

## 2022-02-04 NOTE — Progress Notes (Signed)
? ?  Subjective:  ? ?Patient ID: Jamie [name], female    DOB: 1933-05-30, 88 y.o.   MRN: [id number] ? ?Rash ?Pertinent negatives include no cough, diarrhea, shortness of breath or vomiting.  ?The patient is an 88 YO female coming in for rash. ? ?Review of Systems  ?Constitutional: Negative.   ?HENT: Negative.    ?Eyes: Negative.   ?Respiratory:  Negative for cough, chest tightness and shortness of breath.   ?Cardiovascular:  Negative for chest pain, palpitations and leg swelling.  ?Gastrointestinal:  Negative for abdominal distention, abdominal pain, constipation, diarrhea, nausea and vomiting.  ?Musculoskeletal: Negative.   ?Skin:  Positive for rash.  ?Neurological: Negative.   ?Psychiatric/Behavioral: Negative.    ? ?Objective:  ?Physical Exam ?Constitutional:   ?   Appearance: She is well-developed.  ?HENT:  ?   Head: Normocephalic and atraumatic.  ?Cardiovascular:  ?   Rate and Rhythm: Normal rate and regular rhythm.  ?Pulmonary:  ?   Effort: Pulmonary effort is normal. No respiratory distress.  ?   Breath sounds: Normal breath sounds. No wheezing or rales.  ?Abdominal:  ?   General: Bowel sounds are normal. There is no distension.  ?   Palpations: Abdomen is soft.  ?   Tenderness: There is no abdominal tenderness. There is no rebound.  ?Musculoskeletal:  ?   Cervical back: Normal range of motion.  ?Skin: ?   General: Skin is warm and dry.  ?   Findings: Rash present.  ?Neurological:  ?   Mental Status: She is alert and oriented to person, place, and time.  ?   Coordination: Coordination normal.  ? ? ?Vitals:  ? 02/04/22 1019  ?BP: 130/60  ?Pulse: 82  ?Temp: 98.9 ?F (37.2 ?C)  ?TempSrc: Oral  ?SpO2: 96%  ?Weight: 176 lb (79.8 kg)  ?Height: '5\' 3"'$  (1.6 m)  ? ? ?This visit occurred during the SARS-CoV-2 public health emergency.  Safety protocols were in place, including screening questions prior to the visit, additional usage of staff PPE, and extensive cleaning of exam room while observing appropriate contact  time as indicated for disinfecting solutions.  ? ?Assessment & Plan:  ?Depo-medrol 40 mg IM given at visit ?

## 2022-02-04 NOTE — Assessment & Plan Note (Signed)
Given depo-medrol 40 mg IM today, encouraged not to itch. Use triamcinolone ointment TID she already has at home. ?

## 2022-02-04 NOTE — Assessment & Plan Note (Signed)
For some reason her CT calcium score was not done since Dec 2022. Will reorder to a different location to assess burden of plaque in her heart to help guide treatment. She is taking lipitor 20 mg daily. ?

## 2022-02-09 ENCOUNTER — Emergency Department (HOSPITAL_COMMUNITY): Payer: Medicare PPO

## 2022-02-09 ENCOUNTER — Other Ambulatory Visit: Payer: Self-pay

## 2022-02-09 ENCOUNTER — Emergency Department (HOSPITAL_COMMUNITY)
Admission: EM | Admit: 2022-02-09 | Discharge: 2022-02-09 | Disposition: A | Discharge status: Home / Self Care | Payer: Medicare PPO | Attending: Emergency Medicine | Admitting: Emergency Medicine

## 2022-02-09 DIAGNOSIS — I700 Atherosclerosis of aorta: Secondary | ICD-10-CM | POA: Diagnosis not present

## 2022-02-09 DIAGNOSIS — R002 Palpitations: Secondary | ICD-10-CM | POA: Diagnosis not present

## 2022-02-09 DIAGNOSIS — Z79899 Other long term (current) drug therapy: Secondary | ICD-10-CM | POA: Diagnosis not present

## 2022-02-09 DIAGNOSIS — E876 Hypokalemia: Secondary | ICD-10-CM | POA: Diagnosis not present

## 2022-02-09 DIAGNOSIS — Z91040 Latex allergy status: Secondary | ICD-10-CM | POA: Insufficient documentation

## 2022-02-09 DIAGNOSIS — I4891 Unspecified atrial fibrillation: Secondary | ICD-10-CM | POA: Diagnosis not present

## 2022-02-09 DIAGNOSIS — I10 Essential (primary) hypertension: Secondary | ICD-10-CM | POA: Insufficient documentation

## 2022-02-09 LAB — CBC
HCT: 44.1 % (ref 36.0–46.0)
Hemoglobin: 13.8 g/dL (ref 12.0–15.0)
MCH: 31.3 pg (ref 26.0–34.0)
MCHC: 31.3 g/dL (ref 30.0–36.0)
MCV: 100 fL (ref 80.0–100.0)
Platelets: 234 10*3/uL (ref 150–400)
RBC: 4.41 MIL/uL (ref 3.87–5.11)
RDW: 13.3 % (ref 11.5–15.5)
WBC: 5.8 10*3/uL (ref 4.0–10.5)
nRBC: 0 % (ref 0.0–0.2)

## 2022-02-09 LAB — BASIC METABOLIC PANEL
Anion gap: 10 (ref 5–15)
BUN: 7 mg/dL — ABNORMAL LOW (ref 8–23)
CO2: 22 mmol/L (ref 22–32)
Calcium: 9.2 mg/dL (ref 8.9–10.3)
Chloride: 109 mmol/L (ref 98–111)
Creatinine, Ser: 0.75 mg/dL (ref 0.44–1.00)
GFR, Estimated: >60 mL/min (ref >60)
Glucose, Bld: 83 mg/dL (ref 70–99)
Potassium: 3.3 mmol/L — ABNORMAL LOW (ref 3.5–5.1)
Sodium: 141 mmol/L (ref 135–145)

## 2022-02-09 LAB — MAGNESIUM: Magnesium: 2.2 mg/dL (ref 1.7–2.4)

## 2022-02-09 MED ORDER — POTASSIUM CHLORIDE CRYS ER 20 MEQ PO TBCR
40.0000 meq | EXTENDED_RELEASE_TABLET | Freq: Once | ORAL | Status: AC
  Administered 2022-02-09: 40 meq via ORAL
  Filled 2022-02-09: qty 2

## 2022-02-09 NOTE — ED Notes (Addendum)
Labs sent

## 2022-02-09 NOTE — ED Notes (Signed)
Pt verbalizes understanding of discharge instructions. Opportunity for questions and answers were provided. Pt discharged from the ED.   ?

## 2022-02-09 NOTE — ED Provider Notes (Signed)
[facility] EMER[facility]ENCY DEPARTMENT Provider Note   CSN: [id number] Arrival date & time: 02/09/22  1459     History  Chief Complaint  Patient presents with   Palpitations    Jamie [name] is a 88 y.o. female.   Palpitations Patient has a history of anemia, hyperlipidemia, hypertension, anxiety. Patient presented to the ER for evaluation of palpitations.  Patient states this morning she felt like her heart was beating irregularly.  She was not having any pain.  She was not feeling short of breath.  She just felt that she was having any extra irregular beats.  She has felt somewhat weak in the knees recently but that is not constant.  Patient felt somewhat anxious she took a Xanax.  She is not having fevers or chills.  No leg swelling.  Patient was also recently started on steroids for rash.  Home Medications Prior to Admission medications   Medication Sig Start Date End Date Taking? Authorizing Provider  acetaminophen (TYLENOL) 500 MG tablet Take 500-1,000 mg by mouth every 6 (six) hours as needed (for pain.).    [provider]  albuterol (VENTOLIN HFA) 108 (90 Base) MCG/ACT inhaler INHALE 2 PUFFS BY MOUTH EVERY 4 HOURS AS NEEDED FOR WHEEZE OR FOR SHORTNESS OF BREATH 09/11/21   Hoyt Koch, MD  ALPRAZolam Duanne Moron) 0.5 MG tablet TAKE 1 TABLET BY MOUTH EVERY DAY AS NEEDED FOR ANXIETY 09/11/21   Hoyt Koch, MD  alum & mag hydroxide-simeth (MAALOX/MYLANTA) 200-200-20 MG/5ML suspension Take 15 mLs by mouth every 6 (six) hours as needed for indigestion or heartburn.    [provider]  amLODipine (NORVASC) 5 MG tablet TAKE 1 TABLET BY MOUTH EVERY DAY 09/11/21   Hoyt Koch, MD  atorvastatin (LIPITOR) 20 MG tablet TAKE 1 TABLET BY MOUTH EVERY DAY 09/11/21   Hoyt Koch, MD  benzonatate (TESSALON) 200 MG capsule TAKE 1 CAPSULE BY MOUTH THREE TIMES A DAY AS NEEDED 12/17/21   Hoyt Koch, MD  BREO ELLIPTA  100-25 MCG/ACT AEPB INHALE 1 PUFF BY MOUTH EVERY DAY 11/26/21   Hoyt Koch, MD  famotidine (PEPCID) 20 MG tablet TAKE 1 TABLET BY MOUTH TWICE A DAY 05/08/21   Hoyt Koch, MD  furosemide (LASIX) 40 MG tablet TAKE 1 TABLET BY MOUTH EVERY DAY 09/11/21   Hoyt Koch, MD  montelukast (SINGULAIR) 10 MG tablet TAKE 1 TABLET BY MOUTH EVERYDAY AT BEDTIME 09/11/21   Hoyt Koch, MD  olmesartan (BENICAR) 40 MG tablet TAKE 1 TABLET BY MOUTH EVERY DAY 03/20/21   Hoyt Koch, MD  ondansetron (ZOFRAN-ODT) 4 MG disintegrating tablet TAKE 1 TABLET BY MOUTH EVERY 8 HOURS AS NEEDED FOR NAUSEA AND VOMITING 12/17/21   Hoyt Koch, MD  potassium chloride (KLOR-CON) 10 MEQ tablet TAKE 1 TABLET BY MOUTH EVERY DAY 09/23/21   Truitt Merle, MD  predniSONE (DELTASONE) 20 MG tablet Take 2 tablets (40 mg total) by mouth daily with breakfast. 08/09/21   Hoyt Koch, MD  sennosides-docusate sodium (SENOKOT-S) 8.6-50 MG tablet Take 1 tablet by mouth daily as needed for constipation.    [provider]  triamcinolone cream (KENALOG) 0.1 % APPLY TOPICALLY 2 (TWO) TIMES DAILY AS NEEDED (SKIN IRRITATION/RASH). 04/15/21   Hoyt Koch, MD      Allergies    Aspirin and Latex    Review of Systems   Review of Systems  Constitutional:  Negative for fever.  Cardiovascular:  Positive  for palpitations.   Physical Exam Updated Vital Signs BP 130/75    Pulse 73    Temp 98.2 F (36.8 C)    Resp 16    SpO2 98%  Physical Exam Vitals and nursing note reviewed.  Constitutional:      General: She is not in acute distress.    Appearance: She is well-developed.  HENT:     Head: Normocephalic and atraumatic.     Right Ear: External ear normal.     Left Ear: External ear normal.  Eyes:     General: No scleral icterus.       Right eye: No discharge.        Left eye: No discharge.     Conjunctiva/sclera: Conjunctivae normal.  Neck:     Trachea: No tracheal  deviation.  Cardiovascular:     Rate and Rhythm: Normal rate and regular rhythm.  Pulmonary:     Effort: Pulmonary effort is normal. No respiratory distress.     Breath sounds: Normal breath sounds. No stridor. No wheezing or rales.  Abdominal:     General: Bowel sounds are normal. There is no distension.     Palpations: Abdomen is soft.     Tenderness: There is no abdominal tenderness. There is no guarding or rebound.  Musculoskeletal:        General: No tenderness or deformity.     Cervical back: Neck supple.  Skin:    General: Skin is warm and dry.     Findings: No rash.  Neurological:     General: No focal deficit present.     Mental Status: She is alert.     Cranial Nerves: No cranial nerve deficit (no facial droop, extraocular movements intact, no slurred speech).     Sensory: No sensory deficit.     Motor: No abnormal muscle tone or seizure activity.     Coordination: Coordination normal.     Comments: Normal strength and sensation, 5 out of 5 plantarflexion dorsiflexion  Psychiatric:        Mood and Affect: Mood normal.    ED Results / Procedures / Treatments   Labs (all labs ordered are listed, but only abnormal results are displayed) Labs Reviewed  BASIC METABOLIC PANEL - Abnormal; Notable for the following components:      Result Value   Potassium 3.3 (*)    BUN 7 (*)    All other components within normal limits  CBC  MAGNESIUM    EKG EKG Interpretation  Date/Time:  Sunday February 09 2022 15:03:31 EDT Ventricular Rate:  85 PR Interval:  154 QRS Duration: 68 QT Interval:  348 QTC Calculation: 414 R Axis:   18 Text Interpretation: Sinus rhythm with marked sinus arrhythmia Possible Left atrial enlargement Nonspecific ST abnormality Abnormal ECG When compared with ECG of 05-Aug-2021 13:27, No significant change since last tracing Confirmed by Dorie Rank [phone]) on 02/09/2022 3:46:40 PM  Radiology DG Chest 2 View  Result Date: 02/09/2022 CLINICAL DATA:  afib  EXAM: CHEST - 2 VIEW COMPARISON:  Chest x-ray 08/05/2021, CT chest 11/13/2021 FINDINGS: The heart and mediastinal contours are unchanged. Aortic calcification. Bibasilar linear atelectasis versus scarring. No focal consolidation. No pulmonary edema. No pleural effusion. No pneumothorax. No acute osseous abnormality. IMPRESSION: 1. No active cardiopulmonary disease. 2.  Aortic Atherosclerosis (ICD10-I70.0). Electronically Signed   By: Iven Finn M.D.   On: 02/09/2022 15:57    Procedures .1-3 Lead EKG Interpretation Performed by: Dorie Rank, MD Authorized by: Tomi Bamberger,  Wille Glaser, MD     Interpretation: normal     ECG rate:  80   ECG rate assessment: normal     Rhythm: sinus rhythm     Ectopy: PAC     Conduction: normal   Comments:     4:15 PM     Medications Ordered in ED Medications  potassium chloride SA (KLOR-CON M) CR tablet 40 mEq (40 mEq Oral Given 02/09/22 1800)    ED Course/ Medical Decision Making/ A&P Clinical Course as of 02/09/22 1803  Sun Feb 09, 2022  1750 Magnesium Normal [JK]  5625 Basic metabolic panel(!) Potassium slightly decreased [JK]  1751 CBC Normal [JK]    Clinical Course User Index [JK] Dorie Rank, MD                           Medical Decision Making Amount and/or Complexity of Data Reviewed External Data Reviewed: notes.    Details: recent pcp visit reviewed for rash Labs: ordered. Decision-making details documented in ED Course. Radiology: ordered. ECG/medicine tests: ordered. Decision-making details documented in ED Course.  Risk Prescription drug management.   Patient presented to the ED for evaluation of palpitations.  Vital signs were reassuring in the emergency room.  Patient's EKG shows a sinus rhythm with PACs.  Patient is not having any dyspnea.  No chest pain.  Symptoms not suggestive of acute coronary syndrome.  Patient was monitored in the ED and she did not have any other dysrhythmia other than the occasional PAC.  Laboratory tests  showed mild hypokalemia and the patient was given a low-dose potassium.  Evaluation and diagnostic testing in the emergency department does not suggest an emergent condition requiring admission or immediate intervention beyond what has been performed at this time.  The patient is safe for discharge and has been instructed to return immediately for worsening symptoms, change in symptoms or any other concerns.         Final Clinical Impression(s) / ED Diagnoses Final diagnoses:  Hypokalemia  Palpitation    Rx / DC Orders ED Discharge Orders     None         Dorie Rank, MD 02/09/22 [phone]

## 2022-02-09 NOTE — Discharge Instructions (Addendum)
Follow-up with your doctor later this week if the symptoms persist.  Return as needed for worsening symptoms ?

## 2022-02-09 NOTE — ED Triage Notes (Signed)
Pt states she woke up today w/ palpitations, she said she feels like her rate is not regular, no cardiac hx. Pt was worried about it possibly being anxiety so she took a xanax, states she felt more calm but that her heart was still irregular. Pt denies cp. ?

## 2022-02-10 ENCOUNTER — Telehealth: Payer: Self-pay | Admitting: Internal Medicine

## 2022-02-10 NOTE — Telephone Encounter (Signed)
Connected to [facility] 3.12.2023. ? ?Caller states that when she got up this morning she felt like her heart was beating irregular. Reports that she felt anxious when she woke up and she took an anxiety pill. ? ?Advised to go to ED now.  ?

## 2022-02-13 ENCOUNTER — Ambulatory Visit (INDEPENDENT_AMBULATORY_CARE_PROVIDER_SITE_OTHER): Payer: Medicare PPO | Admitting: Internal Medicine

## 2022-02-13 ENCOUNTER — Other Ambulatory Visit: Payer: Self-pay

## 2022-02-13 ENCOUNTER — Encounter: Payer: Self-pay | Admitting: Internal Medicine

## 2022-02-13 DIAGNOSIS — R002 Palpitations: Secondary | ICD-10-CM | POA: Insufficient documentation

## 2022-02-13 DIAGNOSIS — R21 Rash and other nonspecific skin eruption: Secondary | ICD-10-CM | POA: Diagnosis not present

## 2022-02-13 MED ORDER — BETAMETHASONE DIPROPIONATE 0.05 % EX CREA: TOPICAL_CREAM | Freq: Two times a day (BID) | CUTANEOUS | 0 refills | Status: DC

## 2022-02-13 NOTE — Patient Instructions (Signed)
We have sent in betamethasone which is a stronger cream for the itching rash spot. ? ? ?

## 2022-02-13 NOTE — Assessment & Plan Note (Signed)
EKG reviewed and sinus with PACs. She is no longer having symptoms of palpitations. Suspect depo-medrol injection could have caused these symptoms. She also had mildly low K which was supplemented in the ER. Will continue to monitor for recurrent symptoms and if recurrent could consider holter monitoring.  ?

## 2022-02-13 NOTE — Progress Notes (Signed)
? ?  Subjective:  ? ?Patient ID: Jamie [name], female    DOB: 1933/07/18, 88 y.o.   MRN: [id number] ? ?HPI ?The patient is an 88 YO female coming in for ER follow up (having increasing palpitations with EKG normal except PACs, labs with mildly low K given supplementation). Not having palpitations any longer.  ? ?Review of Systems  ?Constitutional: Negative.   ?HENT: Negative.    ?Eyes: Negative.   ?Respiratory:  Negative for cough, chest tightness and shortness of breath.   ?Cardiovascular:  Negative for chest pain, palpitations and leg swelling.  ?Gastrointestinal:  Negative for abdominal distention, abdominal pain, constipation, diarrhea, nausea and vomiting.  ?Musculoskeletal: Negative.   ?Skin:  Positive for rash.  ?Neurological: Negative.   ?Psychiatric/Behavioral: Negative.    ? ?Objective:  ?Physical Exam ?Constitutional:   ?   Appearance: She is well-developed.  ?HENT:  ?   Head: Normocephalic and atraumatic.  ?Cardiovascular:  ?   Rate and Rhythm: Normal rate and regular rhythm.  ?Pulmonary:  ?   Effort: Pulmonary effort is normal. No respiratory distress.  ?   Breath sounds: Normal breath sounds. No wheezing or rales.  ?Abdominal:  ?   General: Bowel sounds are normal. There is no distension.  ?   Palpations: Abdomen is soft.  ?   Tenderness: There is no abdominal tenderness. There is no rebound.  ?Musculoskeletal:  ?   Cervical back: Normal range of motion.  ?Skin: ?   General: Skin is warm and dry.  ?   Findings: Rash present.  ?   Comments: Rash on chest wall is mildly improved but still red without stigmata of scratching.  ?Neurological:  ?   Mental Status: She is alert and oriented to person, place, and time.  ?   Coordination: Coordination abnormal.  ?   Comments: Wheelchair for long distances  ? ? ?Vitals:  ? 02/13/22 0827  ?BP: 130/68  ?Pulse: 72  ?Resp: 18  ?SpO2: 100%  ?Height: '5\' 3"'$  (1.6 m)  ? ? ?This visit occurred during the SARS-CoV-2 public health emergency.  Safety protocols were in  place, including screening questions prior to the visit, additional usage of staff PPE, and extensive cleaning of exam room while observing appropriate contact time as indicated for disinfecting solutions.  ? ?Assessment & Plan:  ? ?

## 2022-02-13 NOTE — Assessment & Plan Note (Signed)
Discussed that it is possible the depo-medrol injection given at last visit could have contributed to worsening PACs. They are decreasing now and not bothersome. Her rash is improved some but still present. Will change to a stronger steroid cream to help with the itching and redness. Rx betamethasone to use BID for 1-2 weeks.  ?

## 2022-02-21 ENCOUNTER — Encounter: Payer: Self-pay | Admitting: Internal Medicine

## 2022-02-21 ENCOUNTER — Other Ambulatory Visit: Payer: Self-pay

## 2022-02-21 ENCOUNTER — Ambulatory Visit (INDEPENDENT_AMBULATORY_CARE_PROVIDER_SITE_OTHER): Payer: Medicare PPO | Admitting: Internal Medicine

## 2022-02-21 VITALS — BP 118/58 | HR 82 | Temp 98.5°F | Ht 63.0 in | Wt 173.0 lb

## 2022-02-21 DIAGNOSIS — H9193 Unspecified hearing loss, bilateral: Secondary | ICD-10-CM | POA: Diagnosis not present

## 2022-02-21 NOTE — Assessment & Plan Note (Signed)
Ear lavage done at visit to both ears. Some improvement in hearing but not resolution of hearing loss. Referral to audiology for hearing assessment.  ?

## 2022-02-21 NOTE — Patient Instructions (Signed)
We will get you in with the audiologist. ?

## 2022-02-21 NOTE — Progress Notes (Signed)
? ?  Subjective:  ? ?Patient ID: Jamie [name], female    DOB: September 26, 1933, 88 y.o.   MRN: [id number] ? ?HPI ?The patient is an 88 YO female coming in for hearing loss.  ? ?Review of Systems  ?Constitutional: Negative.   ?HENT:  Positive for hearing loss.   ?Eyes: Negative.   ?Respiratory:  Negative for cough, chest tightness and shortness of breath.   ?Cardiovascular:  Negative for chest pain, palpitations and leg swelling.  ?Gastrointestinal:  Negative for abdominal distention, abdominal pain, constipation, diarrhea, nausea and vomiting.  ?Musculoskeletal: Negative.   ?Skin: Negative.   ?Neurological: Negative.   ?Psychiatric/Behavioral: Negative.    ? ?Objective:  ?Physical Exam ?Constitutional:   ?   Appearance: She is well-developed.  ?HENT:  ?   Head: Normocephalic and atraumatic.  ?   Comments:  both ears canal impacted with copious hard wax, examination post ear lavage canal is clear and no bleeding or complications noted. ? ? ?Cardiovascular:  ?   Rate and Rhythm: Normal rate and regular rhythm.  ?Pulmonary:  ?   Effort: Pulmonary effort is normal. No respiratory distress.  ?   Breath sounds: Normal breath sounds. No wheezing or rales.  ?Abdominal:  ?   General: Bowel sounds are normal. There is no distension.  ?   Palpations: Abdomen is soft.  ?   Tenderness: There is no abdominal tenderness. There is no rebound.  ?Musculoskeletal:  ?   Cervical back: Normal range of motion.  ?Skin: ?   General: Skin is warm and dry.  ?Neurological:  ?   Mental Status: She is alert and oriented to person, place, and time.  ?   Coordination: Coordination normal.  ? ? ?Vitals:  ? 02/21/22 0903  ?BP: (!) 118/58  ?Pulse: 82  ?Temp: 98.5 ?F (36.9 ?C)  ?TempSrc: Oral  ?SpO2: 99%  ?Weight: 173 lb (78.5 kg)  ?Height: '5\' 3"'$  (1.6 m)  ? ? ?This visit occurred during the SARS-CoV-2 public health emergency.  Safety protocols were in place, including screening questions prior to the visit, additional usage of staff PPE, and extensive  cleaning of exam room while observing appropriate contact time as indicated for disinfecting solutions.  ? ?Assessment & Plan:  ? ?

## 2022-03-03 ENCOUNTER — Ambulatory Visit: Payer: Medicare PPO | Attending: Internal Medicine | Admitting: Audiology

## 2022-03-03 DIAGNOSIS — H9193 Unspecified hearing loss, bilateral: Secondary | ICD-10-CM | POA: Insufficient documentation

## 2022-03-03 DIAGNOSIS — H6122 Impacted cerumen, left ear: Secondary | ICD-10-CM | POA: Diagnosis not present

## 2022-03-03 NOTE — Procedures (Signed)
?  [facility] ?[address] ?[place], [place]  [address] ?[phone] ? ?AUDIOLOGICAL  EVALUATION ? ?NAME: Jamie [name]     ?DOB:   1933/04/08      ?MRN: [id number]                                                                                     ?DATE: 03/03/2022     ?REFERENT: Hoyt Koch, MD ?STATUS: Outpatient ?DIAGNOSIS: Decreased hearing  ? ?History: ?[name] was seen for an audiological evaluation due to decreased hearing occurring for 3 weeks. [name] reports she hears better out of the right ear. She reports left "whistling" tinnitus and aural fullness. She denies dizziness and otalgia. [name] reports she recently had cerumen removed at her Primary Care office however only cerumen was successfully removed from the right ear and not the left.  ? ?Evaluation:  ?Otoscopy showed a clear view of the tympanic membrane in the right ear and excessive cerumen was visualized in the left ear.  ?Tympanometry results were consistent in the right ear with normal middle ear pressure and normal tympanic membrane mobility (Type A) and consistent in the left ear with no tympanic membrane mobility and occluding cerumen (Type B).  ?Further testing was not completed due to left ear cerumen impaction. An Audiological Evaluation is recommended after cerumen removal and management.  ? ?Results:  ?Today's test results from tympanometry show normal middle ear function in the right ear and no tympanic membrane mobility and occluding cerumen in the left ear. It is recommended for [name] to start using Debrox wax softening drops and to follow up with her Primary Care office for cerumen removal. [name] was given a handout on Debrox, instructions, and purchase information. ? ?Recommendations: ?Follow up with PCP for left cerumen removal ?Return for an audiological evaluation on Apr 02, 2022 at 8:00am pending cerumen removal.  ? ?  ?If you have any questions please  feel free to contact me at [phone]. ? ?Bari Mantis ?Audiologist, Au.D., CCC-A ?03/03/2022  1:44 PM ? ?Cc: Hoyt Koch, MD ? ?

## 2022-03-11 ENCOUNTER — Ambulatory Visit (INDEPENDENT_AMBULATORY_CARE_PROVIDER_SITE_OTHER): Payer: Medicare PPO

## 2022-03-11 DIAGNOSIS — Z0000 Encounter for general adult medical examination without abnormal findings: Secondary | ICD-10-CM

## 2022-03-11 NOTE — Patient Instructions (Signed)
Ms. Borrayo , ?Thank you for taking time to come for your Medicare Wellness Visit. I appreciate your ongoing commitment to your health goals. Please review the following plan we discussed and let me know if I can assist you in the future.  ? ?Screening recommendations/referrals: ?Colonoscopy: 03/18/2021; due every year ?Mammogram: 05/29/2021; due every year ?Bone Density: 08/03/2012; due every 2 years (done at GYN office) ?Recommended yearly ophthalmology/optometry visit for glaucoma screening and checkup ?Recommended yearly dental visit for hygiene and checkup ? ?Vaccinations: ?Influenza vaccine: 08/09/2021 ?Pneumococcal vaccine: 09/22/2013, 12/18/2019 ?Tdap vaccine: 05/17/2010; Overdue ?Shingles vaccine: never done   ?Covid-19: 01/27/2020, 02/22/2020, 09/29/2020, 12/24/2021 ? ?Advanced directives: No ? ?Conditions/risks identified: Yes ? ?Next appointment: Please schedule your next Medicare Wellness Visit with your Nurse Health Advisor in 1 year by calling [phone]. ? ? ?Preventive Care 33 Years and Older, Female ?Preventive care refers to lifestyle choices and visits with your health care provider that can promote health and wellness. ?What does preventive care include? ?A yearly physical exam. This is also called an annual well check. ?Dental exams once or twice a year. ?Routine eye exams. Ask your health care provider how often you should have your eyes checked. ?Personal lifestyle choices, including: ?Daily care of your teeth and gums. ?Regular physical activity. ?Eating a healthy diet. ?Avoiding tobacco and drug use. ?Limiting alcohol use. ?Practicing safe sex. ?Taking low-dose aspirin every day. ?Taking vitamin and mineral supplements as recommended by your health care provider. ?What happens during an annual well check? ?The services and screenings done by your health care provider during your annual well check will depend on your age, overall health, lifestyle risk factors, and family history of  disease. ?Counseling  ?Your health care provider may ask you questions about your: ?Alcohol use. ?Tobacco use. ?Drug use. ?Emotional well-being. ?Home and relationship well-being. ?Sexual activity. ?Eating habits. ?History of falls. ?Memory and ability to understand (cognition). ?Work and work Statistician. ?Reproductive health. ?Screening  ?You may have the following tests or measurements: ?Height, weight, and BMI. ?Blood pressure. ?Lipid and cholesterol levels. These may be checked every 5 years, or more frequently if you are over 6 years old. ?Skin check. ?Lung cancer screening. You may have this screening every year starting at age 43 if you have a 30-pack-year history of smoking and currently smoke or have quit within the past 15 years. ?Fecal occult blood test (FOBT) of the stool. You may have this test every year starting at age 46. ?Flexible sigmoidoscopy or colonoscopy. You may have a sigmoidoscopy every 5 years or a colonoscopy every 10 years starting at age 36. ?Hepatitis C blood test. ?Hepatitis B blood test. ?Sexually transmitted disease (STD) testing. ?Diabetes screening. This is done by checking your blood sugar (glucose) after you have not eaten for a while (fasting). You may have this done every 1-3 years. ?Bone density scan. This is done to screen for osteoporosis. You may have this done starting at age 56. ?Mammogram. This may be done every 1-2 years. Talk to your health care provider about how often you should have regular mammograms. ?Talk with your health care provider about your test results, treatment options, and if necessary, the need for more tests. ?Vaccines  ?Your health care provider may recommend certain vaccines, such as: ?Influenza vaccine. This is recommended every year. ?Tetanus, diphtheria, and acellular pertussis (Tdap, Td) vaccine. You may need a Td booster every 10 years. ?Zoster vaccine. You may need this after age 16. ?Pneumococcal 13-valent conjugate (PCV13) vaccine. One  dose is recommended after age 98. ?Pneumococcal polysaccharide (PPSV23) vaccine. One dose is recommended after age 14. ?Talk to your health care provider about which screenings and vaccines you need and how often you need them. ?This information is not intended to replace advice given to you by your health care provider. Make sure you discuss any questions you have with your health care provider. ?Document Released: 12/14/2015 Document Revised: 08/06/2016 Document Reviewed: 09/18/2015 ?Elsevier Interactive Patient Education ? 2017 [employer]. ? ?Fall Prevention in the Home ?Falls can cause injuries. They can happen to people of all ages. There are many things you can do to make your home safe and to help prevent falls. ?What can I do on the outside of my home? ?Regularly fix the edges of walkways and driveways and fix any cracks. ?Remove anything that might make you trip as you walk through a door, such as a raised step or threshold. ?Trim any bushes or trees on the path to your home. ?Use bright outdoor lighting. ?Clear any walking paths of anything that might make someone trip, such as rocks or tools. ?Regularly check to see if handrails are loose or broken. Make sure that both sides of any steps have handrails. ?Any raised decks and porches should have guardrails on the edges. ?Have any leaves, snow, or ice cleared regularly. ?Use sand or salt on walking paths during winter. ?Clean up any spills in your garage right away. This includes oil or grease spills. ?What can I do in the bathroom? ?Use night lights. ?Install grab bars by the toilet and in the tub and shower. Do not use towel bars as grab bars. ?Use non-skid mats or decals in the tub or shower. ?If you need to sit down in the shower, use a plastic, non-slip stool. ?Keep the floor dry. Clean up any water that spills on the floor as soon as it happens. ?Remove soap buildup in the tub or shower regularly. ?Attach bath mats securely with double-sided  non-slip rug tape. ?Do not have throw rugs and other things on the floor that can make you trip. ?What can I do in the bedroom? ?Use night lights. ?Make sure that you have a light by your bed that is easy to reach. ?Do not use any sheets or blankets that are too big for your bed. They should not hang down onto the floor. ?Have a firm chair that has side arms. You can use this for support while you get dressed. ?Do not have throw rugs and other things on the floor that can make you trip. ?What can I do in the kitchen? ?Clean up any spills right away. ?Avoid walking on wet floors. ?Keep items that you use a lot in easy-to-reach places. ?If you need to reach something above you, use a strong step stool that has a grab bar. ?Keep electrical cords out of the way. ?Do not use floor polish or wax that makes floors slippery. If you must use wax, use non-skid floor wax. ?Do not have throw rugs and other things on the floor that can make you trip. ?What can I do with my stairs? ?Do not leave any items on the stairs. ?Make sure that there are handrails on both sides of the stairs and use them. Fix handrails that are broken or loose. Make sure that handrails are as long as the stairways. ?Check any carpeting to make sure that it is firmly attached to the stairs. Fix any carpet that is loose or worn. ?Avoid  having throw rugs at the top or bottom of the stairs. If you do have throw rugs, attach them to the floor with carpet tape. ?Make sure that you have a light switch at the top of the stairs and the bottom of the stairs. If you do not have them, ask someone to add them for you. ?What else can I do to help prevent falls? ?Wear shoes that: ?Do not have high heels. ?Have rubber bottoms. ?Are comfortable and fit you well. ?Are closed at the toe. Do not wear sandals. ?If you use a stepladder: ?Make sure that it is fully opened. Do not climb a closed stepladder. ?Make sure that both sides of the stepladder are locked into place. ?Ask  someone to hold it for you, if possible. ?Clearly mark and make sure that you can see: ?Any grab bars or handrails. ?First and last steps. ?Where the edge of each step is. ?Use tools that help you move around (mobility aids) if

## 2022-03-11 NOTE — Progress Notes (Signed)
?I connected with Jamie [name] today by telephone and verified that I am speaking with the correct person using two identifiers. ?Location patient: home ?Location provider: work ?Persons participating in the virtual visit: patient, provider. ?  ?I discussed the limitations, risks, security and privacy concerns of performing an evaluation and management service by telephone and the availability of in person appointments. I also discussed with the patient that there may be a patient responsible charge related to this service. The patient expressed understanding and verbally consented to this telephonic visit.  ?  ?Interactive audio and video telecommunications were attempted between this provider and patient, however failed, due to patient having technical difficulties OR patient did not have access to video capability.  We continued and completed visit with audio only. ? ?Some vital signs may be absent or patient reported.  ? ?Time Spent with patient on telephone encounter: 30 minutes ?Subjective:  ? Jamie [name] is a 88 y.o. female who presents for Medicare Annual (Subsequent) preventive examination. ? ?Review of Systems    ? ?Cardiac Risk Factors include: advanced age (>67mn, >>39women);dyslipidemia;hypertension;family history of premature cardiovascular disease;obesity (BMI >30kg/m2) ? ?   ?Objective:  ?  ?There were no vitals filed for this visit. ?There is no height or weight on file to calculate BMI. ? ? ?  03/11/2022  ? 11:17 AM 02/28/2021  ? 11:03 AM 10/17/2020  ? 10:37 AM 12/15/2019  ?  9:22 AM 12/12/2019  ?  8:25 AM 10/14/2019  ?  9:42 AM 10/12/2018  ?  9:26 AM  ?Advanced Directives  ?Does Patient Have a Medical Advance Directive? No No No No No No No  ?Does patient want to make changes to medical advance directive?       Yes (ED - Information included in AVS)  ?Would patient like information on creating a medical advance directive? No - Patient declined No - Patient declined  No - Patient  declined No - Patient declined Yes (ED - Information included in AVS)   ? ? ?Current Medications (verified) ?Outpatient Encounter Medications as of 03/11/2022  ?Medication Sig  ? acetaminophen (TYLENOL) 500 MG tablet Take 500-1,000 mg by mouth every 6 (six) hours as needed (for pain.).  ? albuterol (VENTOLIN HFA) 108 (90 Base) MCG/ACT inhaler INHALE 2 PUFFS BY MOUTH EVERY 4 HOURS AS NEEDED FOR WHEEZE OR FOR SHORTNESS OF BREATH  ? ALPRAZolam (XANAX) 0.5 MG tablet TAKE 1 TABLET BY MOUTH EVERY DAY AS NEEDED FOR ANXIETY  ? alum & mag hydroxide-simeth (MAALOX/MYLANTA) 200-200-20 MG/5ML suspension Take 15 mLs by mouth every 6 (six) hours as needed for indigestion or heartburn.  ? amLODipine (NORVASC) 5 MG tablet TAKE 1 TABLET BY MOUTH EVERY DAY  ? atorvastatin (LIPITOR) 20 MG tablet TAKE 1 TABLET BY MOUTH EVERY DAY  ? benzonatate (TESSALON) 200 MG capsule TAKE 1 CAPSULE BY MOUTH THREE TIMES A DAY AS NEEDED  ? betamethasone dipropionate 0.05 % cream Apply topically 2 (two) times daily.  ? BREO ELLIPTA 100-25 MCG/ACT AEPB INHALE 1 PUFF BY MOUTH EVERY DAY  ? famotidine (PEPCID) 20 MG tablet TAKE 1 TABLET BY MOUTH TWICE A DAY  ? furosemide (LASIX) 40 MG tablet TAKE 1 TABLET BY MOUTH EVERY DAY  ? montelukast (SINGULAIR) 10 MG tablet TAKE 1 TABLET BY MOUTH EVERYDAY AT BEDTIME  ? olmesartan (BENICAR) 40 MG tablet TAKE 1 TABLET BY MOUTH EVERY DAY  ? ondansetron (ZOFRAN-ODT) 4 MG disintegrating tablet TAKE 1 TABLET BY MOUTH EVERY 8 HOURS AS NEEDED FOR  NAUSEA AND VOMITING  ? potassium chloride (KLOR-CON) 10 MEQ tablet TAKE 1 TABLET BY MOUTH EVERY DAY  ? predniSONE (DELTASONE) 20 MG tablet Take 2 tablets (40 mg total) by mouth daily with breakfast.  ? sennosides-docusate sodium (SENOKOT-S) 8.6-50 MG tablet Take 1 tablet by mouth daily as needed for constipation.  ? triamcinolone cream (KENALOG) 0.1 % APPLY TOPICALLY 2 (TWO) TIMES DAILY AS NEEDED (SKIN IRRITATION/RASH).  ? ?No facility-administered encounter medications on file as of  03/11/2022.  ? ? ?Allergies (verified) ?Aspirin and Latex  ? ?History: ?Past Medical History:  ?Diagnosis Date  ? Adenocarcinoma of cecum ([place]) 09/28/2019  ? ANEMIA, IRON DEFICIENCY 09/10/2007  ? Anxiety   ? Arthritis   ? Asthma   ? Blood transfusion without reported diagnosis   ? Cataract 2008  ? bilateral cataract extraction  ? CATARACT EXTRACTION, HX OF 09/10/2007  ? DYSPEPSIA, HX OF 09/10/2007  ? GERD (gastroesophageal reflux disease)   ? HAIR LOSS 03/21/2009  ? HAY FEVER 09/10/2007  ? HEARING LOSS, SENSORINEURAL 09/10/2007  ? HYPERLIPIDEMIA 09/10/2007  ? HYPERTENSION 09/10/2007  ? LOW BACK PAIN 05/10/2008  ? Pain in joint, hand 03/01/2008  ? PEPTIC ULCER DISEASE 09/10/2007  ? POLYP, COLON 09/10/2007  ? POLYPECTOMY, HX OF 09/10/2007  ? Pulmonary embolism ([place]) 1- 2 non-occlusive 10/10/2019  ? SHINGLES 08/13/2009  ? SHOULDER PAIN, LEFT 02/12/2008  ? TAH/BSO, HX OF 09/10/2007  ? Tuberculosis   ? PATIENT DENIES AT PHONE INTERVIEW ON 12/12/2019  ? ?Past Surgical History:  ?Procedure Laterality Date  ? ABDOMINAL HYSTERECTOMY    ? BREAST SURGERY    ? breast biopsy-benign  ? CATARACT EXTRACTION    ? COLONOSCOPY    ? EYE SURGERY    ? bilateral cataract with lens implant  ? IRRIGATION AND DEBRIDEMENT SEBACEOUS CYST    ? LAPAROSCOPIC RIGHT HEMI COLECTOMY Right 12/15/2019  ? Procedure: LAPAROSCOPIC RIGHT HEMI COLECTOMY WITH LYSIS OF ADHESIONS;  Surgeon: Ileana Roup, MD;  Location: [facility];  Service: General;  Laterality: Right;  ? TOTAL ABDOMINAL HYSTERECTOMY W/ BILATERAL SALPINGOOPHORECTOMY    ? ?Family History  ?Problem Relation Age of Onset  ? Hypertension Mother   ? Coronary artery disease Mother   ? Heart disease Mother   ?     CHF  ? Cancer Son 32  ?     prostate cancer   ? Cancer Son 51  ?     prostate cancer  ? Diabetes Neg Hx   ? ?Social History  ? ?Socioeconomic History  ? Marital status: Widowed  ?  Spouse name: Not on file  ? Number of children: 4  ? Years of education: Not on file  ? Highest education level:  Not on file  ?Occupational History  ? Occupation: retired  ?Tobacco Use  ? Smoking status: Former  ?  Packs/day: 0.20  ?  Years: 25.00  ?  Pack years: 5.00  ?  Types: Cigarettes  ?  Quit date: 12/01/1989  ?  Years since quitting: 32.2  ? Smokeless tobacco: Never  ? Tobacco comments:  ?  smoked a long time ago; Quit 89 or 91/ number is approximate  ?Vaping Use  ? Vaping Use: Never used  ?Substance and Sexual Activity  ? Alcohol use: No  ?  Alcohol/week: 0.0 standard drinks  ? Drug use: No  ? Sexual activity: Not Currently  ?Other Topics Concern  ? Not on file  ?Social History Narrative  ? 3 sons, 54,59, 6 and 1  daughter 59, 4 grandchildren  ? SO with multiple medical problems  ? ?Social Determinants of Health  ? ?Financial Resource Strain: Low Risk   ? Difficulty of Paying Living Expenses: Not hard at all  ?Food Insecurity: No Food Insecurity  ? Worried About Charity fundraiser in the Last Year: Never true  ? Ran Out of Food in the Last Year: Never true  ?Transportation Needs: No Transportation Needs  ? Lack of Transportation (Medical): No  ? Lack of Transportation (Non-Medical): No  ?Physical Activity: Inactive  ? Days of Exercise per Week: 0 days  ? Minutes of Exercise per Session: 0 min  ?Stress: No Stress Concern Present  ? Feeling of Stress : Not at all  ?Social Connections: Moderately Isolated  ? Frequency of Communication with Friends and Family: More than three times a week  ? Frequency of Social Gatherings with Friends and Family: More than three times a week  ? Attends Religious Services: More than 4 times per year  ? Active Member of Clubs or Organizations: No  ? Attends Archivist Meetings: Never  ? Marital Status: Widowed  ? ? ?Tobacco Counseling ?Counseling given: Not Answered ?Tobacco comments: smoked a long time ago; Quit 90 or 91/ number is approximate ? ? ?Clinical Intake: ? ?Pre-visit preparation completed: Yes ? ?Pain : No/denies pain ? ?  ? ?Nutritional Risks: None ?Diabetes: No ? ?How  often do you need to have someone help you when you read instructions, pamphlets, or other written materials from your doctor or pharmacy?: 1 - Never ?What is the last grade level you completed in school?: HSG

## 2022-03-12 ENCOUNTER — Ambulatory Visit (INDEPENDENT_AMBULATORY_CARE_PROVIDER_SITE_OTHER): Payer: Medicare PPO | Admitting: Internal Medicine

## 2022-03-12 ENCOUNTER — Encounter: Payer: Self-pay | Admitting: Internal Medicine

## 2022-03-12 DIAGNOSIS — H9193 Unspecified hearing loss, bilateral: Secondary | ICD-10-CM

## 2022-03-12 NOTE — Patient Instructions (Signed)
Keep using the cream twice a day on the rash. Let us know if this is not going away.  ?

## 2022-03-12 NOTE — Progress Notes (Signed)
? ?  Subjective:  ? ?Patient ID: Jamie [name], female    DOB: 08/28/1933, 88 y.o.   MRN: [id number] ? ?Ear Fullness  ?Associated symptoms include hearing loss. Pertinent negatives include no abdominal pain, coughing, diarrhea or vomiting.  ?The patient is an 88 YO female coming in for hearing loss and ear wax ? ?Review of Systems  ?Constitutional: Negative.   ?HENT:  Positive for hearing loss.   ?Eyes: Negative.   ?Respiratory:  Negative for cough, chest tightness and shortness of breath.   ?Cardiovascular:  Negative for chest pain, palpitations and leg swelling.  ?Gastrointestinal:  Negative for abdominal distention, abdominal pain, constipation, diarrhea, nausea and vomiting.  ?Musculoskeletal: Negative.   ?Skin: Negative.   ?Neurological: Negative.   ?Psychiatric/Behavioral: Negative.    ? ?Objective:  ?Physical Exam ?Constitutional:   ?   Appearance: She is well-developed.  ?HENT:  ?   Head: Normocephalic and atraumatic.  ?   Comments:  left ear canal impacted with copious hard wax, examination post ear lavage canal is clear and no bleeding or complications noted. ?  ?Cardiovascular:  ?   Rate and Rhythm: Normal rate and regular rhythm.  ?Pulmonary:  ?   Effort: Pulmonary effort is normal. No respiratory distress.  ?   Breath sounds: Normal breath sounds. No wheezing or rales.  ?Abdominal:  ?   General: Bowel sounds are normal. There is no distension.  ?   Palpations: Abdomen is soft.  ?   Tenderness: There is no abdominal tenderness. There is no rebound.  ?Musculoskeletal:  ?   Cervical back: Normal range of motion.  ?Skin: ?   General: Skin is warm and dry.  ?Neurological:  ?   Mental Status: She is alert and oriented to person, place, and time.  ?   Coordination: Coordination normal.  ? ? ?Vitals:  ? 03/12/22 0937  ?BP: 126/80  ?Pulse: 73  ?Resp: 18  ?SpO2: 100%  ?Weight: 172 lb 12.8 oz (78.4 kg)  ?Height: '5\' 3"'$  (1.6 m)  ? ? ?This visit occurred during the SARS-CoV-2 public health emergency.  Safety  protocols were in place, including screening questions prior to the visit, additional usage of staff PPE, and extensive cleaning of exam room while observing appropriate contact time as indicated for disinfecting solutions.  ? ?Assessment & Plan:  ?Visit time 15 minutes in face to face communication with patient and coordination of care, additional 5 minutes spent in record review, coordination or care, ordering tests, communicating/referring to other healthcare professionals, documenting in medical records all on the same day of the visit for total time 20 minutes spent on the visit.  ? ?

## 2022-03-14 NOTE — Assessment & Plan Note (Signed)
Hearing referral was done at prior visit and they determined right hearing normal but left occluded with wax. She is here for wax removal which is done of the left. She will return to hearing assessment in a few weeks for left ear testing.  ?

## 2022-03-24 ENCOUNTER — Other Ambulatory Visit: Payer: Self-pay | Admitting: Internal Medicine

## 2022-03-25 ENCOUNTER — Other Ambulatory Visit: Payer: Self-pay | Admitting: Internal Medicine

## 2022-03-26 ENCOUNTER — Other Ambulatory Visit: Payer: Self-pay | Admitting: Internal Medicine

## 2022-04-02 ENCOUNTER — Ambulatory Visit: Payer: Medicare PPO | Attending: Internal Medicine | Admitting: Audiologist

## 2022-04-02 DIAGNOSIS — H903 Sensorineural hearing loss, bilateral: Secondary | ICD-10-CM | POA: Insufficient documentation

## 2022-04-02 DIAGNOSIS — H6122 Impacted cerumen, left ear: Secondary | ICD-10-CM | POA: Diagnosis not present

## 2022-04-02 NOTE — Procedures (Signed)
?[facility] ?[address] ?[place], [place]  [address] ?[phone] ? ?AUDIOLOGICAL  EVALUATION ? ?NAME: Jamie [name]     ?DOB:   May 15, 1933      ?MRN: [id number]                                                                                     ?DATE: 04/02/2022     ?REFERENT: Hoyt Koch, MD ?STATUS: Outpatient ?DIAGNOSIS: Sensorineural Hearing Loss Bilateral   ? ? ?History: ?Jamie [name] was seen for an audiological evaluation after having cerumen removed from the left ear. ?Jamie [name] is receiving a hearing evaluation due to concerns for difficulty hearing from her left ear, she was seen by [facility] 3rd and found to have impacted wax in the left ear. Jamie [name] went back to her PCP and had the left ear wax partially removed. She returns today feeling she hears slightly better.  Jamie [name] denies any changes in medical history from her last appointment. Medical history negative for a condition which is a risk factor for hearing loss. No other relevant case history reported.  ? ? ?Evaluation:  ?Otoscopy showed a clear view of the tympanic membrane in the right ear and significant cerumen build up still in the left ear  ?Tympanometry results were consistent with normal middle ear function, bilaterally   ?Audiometric testing was completed using conventional audiometry with insert and supraural transducer. Speech Recognition Thresholds were 30 in the right ear and 40dB in the left ear consistent with pure tone averages. Word Recognition was good, 96% in the right ear and 92% in the left ear at an elevated level of 40dB SL in each ear with masking. Pure tone thresholds show mild sloping to severe sensorineural hearing loss in both ears. Test results are consistent with age related changes with slight differences between the ears.  ? ?Results:  ?The test results were reviewed with Jamie [name]. She was given two copies of her audiogram. Jamie [name] has a  mild sloping to severe high frequency hearing loss. The loss keeps her from hearing high pitched consonants that make speech clear. She should try hearing aids to help her understand speech clearly. She reported understanding. She was given guidance on local hearing aid options.   ? ?Recommendations: ?Amplification is necessary for both ears. Hearing aids can be purchased from a variety of locations. See provided list for locations in the [place] area. ?Keep using Debrox to remove cerumen from left ear.  ? ?Debrox Earwax Removal Drops are a safe and inexpensive in-home solution for wax removal. Debrox Earwax Removal Kit includes a soft rubber bulb syringe to rinse your ear after using Debrox Earwax Removal Drops. Excessive earwax build-up can lead to ear discomfort and reduced hearing, which can affect your day-to-day life. The kit can be purchased over the counter at [place], [place], [facility], and most other pharmacies.  ?How to use the Debrox Earwax Removal Drops Kit:  ?tilt head sideways. ?place 5 to 10 drops into ear. ?tip of applicator should not enter ear canal. ?keep drops in ear for several minutes by keeping head tilted or placing cotton in the ear. ?use twice daily for  up to four days  ?gently flush ear with water, using soft rubber bulb syringe after final treatment (on 4th day)   ?  ?Jamie [name]  ?Audiologist, Au.D., CCC-A ?04/02/2022  8:39 AM ? ?Cc: Hoyt Koch, MD ? ?

## 2022-04-11 ENCOUNTER — Other Ambulatory Visit: Payer: Self-pay | Admitting: Internal Medicine

## 2022-04-11 DIAGNOSIS — E782 Mixed hyperlipidemia: Secondary | ICD-10-CM

## 2022-04-17 ENCOUNTER — Ambulatory Visit (INDEPENDENT_AMBULATORY_CARE_PROVIDER_SITE_OTHER)
Admission: RE | Admit: 2022-04-17 | Discharge: 2022-04-17 | Disposition: A | Discharge status: Home / Self Care | Payer: Medicare PPO | Source: Ambulatory Visit | Attending: Internal Medicine | Admitting: Internal Medicine

## 2022-04-17 ENCOUNTER — Ambulatory Visit: Admission: RE | Admit: 2022-04-17 | Payer: Self-pay | Source: Ambulatory Visit

## 2022-04-17 DIAGNOSIS — I700 Atherosclerosis of aorta: Secondary | ICD-10-CM

## 2022-04-24 ENCOUNTER — Other Ambulatory Visit: Payer: Self-pay | Admitting: Internal Medicine

## 2022-04-24 DIAGNOSIS — F419 Anxiety disorder, unspecified: Secondary | ICD-10-CM

## 2022-04-29 ENCOUNTER — Telehealth: Payer: Self-pay

## 2022-04-29 NOTE — Telephone Encounter (Signed)
Noted  

## 2022-04-29 NOTE — Telephone Encounter (Signed)
Pt is calling for her CT scoring results. I advised  the pt that Dr. Sharlet Salina states that You did have a high score on the calcium score and we should have you talk to a heart specialist (cardiologist and will get you in with them).  Pt is willing to see Cardiology. I advised the pt once the referral is placed Cardiology office will call to get her scheduled.  FYI

## 2022-05-02 ENCOUNTER — Other Ambulatory Visit: Payer: Medicare PPO

## 2022-05-12 ENCOUNTER — Other Ambulatory Visit: Payer: Self-pay | Admitting: Internal Medicine

## 2022-05-19 ENCOUNTER — Encounter: Payer: Self-pay | Admitting: Hematology

## 2022-05-19 ENCOUNTER — Inpatient Hospital Stay (HOSPITAL_BASED_OUTPATIENT_CLINIC_OR_DEPARTMENT_OTHER): Payer: Medicare PPO | Admitting: Hematology

## 2022-05-19 ENCOUNTER — Other Ambulatory Visit: Payer: Self-pay | Admitting: *Deleted

## 2022-05-19 ENCOUNTER — Inpatient Hospital Stay: Payer: Medicare PPO | Attending: Hematology

## 2022-05-19 ENCOUNTER — Inpatient Hospital Stay: Payer: Medicare PPO | Admitting: Hematology

## 2022-05-19 ENCOUNTER — Other Ambulatory Visit: Payer: Self-pay

## 2022-05-19 ENCOUNTER — Inpatient Hospital Stay: Payer: Medicare PPO

## 2022-05-19 VITALS — BP 148/62 | HR 78 | Temp 98.6°F | Resp 18 | Ht 63.0 in | Wt 174.4 lb

## 2022-05-19 DIAGNOSIS — Z86711 Personal history of pulmonary embolism: Secondary | ICD-10-CM | POA: Insufficient documentation

## 2022-05-19 DIAGNOSIS — Z85038 Personal history of other malignant neoplasm of large intestine: Secondary | ICD-10-CM | POA: Insufficient documentation

## 2022-05-19 DIAGNOSIS — K219 Gastro-esophageal reflux disease without esophagitis: Secondary | ICD-10-CM | POA: Insufficient documentation

## 2022-05-19 DIAGNOSIS — C180 Malignant neoplasm of cecum: Secondary | ICD-10-CM | POA: Diagnosis not present

## 2022-05-19 DIAGNOSIS — M170 Bilateral primary osteoarthritis of knee: Secondary | ICD-10-CM | POA: Diagnosis not present

## 2022-05-19 DIAGNOSIS — I10 Essential (primary) hypertension: Secondary | ICD-10-CM | POA: Diagnosis not present

## 2022-05-19 DIAGNOSIS — E876 Hypokalemia: Secondary | ICD-10-CM | POA: Insufficient documentation

## 2022-05-19 LAB — CMP (CANCER CENTER ONLY)
ALT: 12 U/L (ref 0–44)
AST: 15 U/L (ref 15–41)
Albumin: 4.1 g/dL (ref 3.5–5.0)
Alkaline Phosphatase: 170 U/L — ABNORMAL HIGH (ref 38–126)
Anion gap: 8 (ref 5–15)
BUN: 10 mg/dL (ref 8–23)
CO2: 24 mmol/L (ref 22–32)
Calcium: 9.1 mg/dL (ref 8.9–10.3)
Chloride: 110 mmol/L (ref 98–111)
Creatinine: 0.92 mg/dL (ref 0.44–1.00)
GFR, Estimated: >60 mL/min (ref >60)
Glucose, Bld: 129 mg/dL — ABNORMAL HIGH (ref 70–99)
Potassium: 3.5 mmol/L (ref 3.5–5.1)
Sodium: 142 mmol/L (ref 135–145)
Total Bilirubin: 0.5 mg/dL (ref 0.3–1.2)
Total Protein: 7.1 g/dL (ref 6.5–8.1)

## 2022-05-19 LAB — CBC WITH DIFFERENTIAL (CANCER CENTER ONLY)
Abs Immature Granulocytes: 0.02 10*3/uL (ref 0.00–0.07)
Basophils Absolute: 0 10*3/uL (ref 0.0–0.1)
Basophils Relative: 1 %
Eosinophils Absolute: 0.3 10*3/uL (ref 0.0–0.5)
Eosinophils Relative: 5 %
HCT: 37.3 % (ref 36.0–46.0)
Hemoglobin: 12.6 g/dL (ref 12.0–15.0)
Immature Granulocytes: 0 %
Lymphocytes Relative: 24 %
Lymphs Abs: 1.4 10*3/uL (ref 0.7–4.0)
MCH: 32.1 pg (ref 26.0–34.0)
MCHC: 33.8 g/dL (ref 30.0–36.0)
MCV: 95.2 fL (ref 80.0–100.0)
Monocytes Absolute: 0.4 10*3/uL (ref 0.1–1.0)
Monocytes Relative: 8 %
Neutro Abs: 3.7 10*3/uL (ref 1.7–7.7)
Neutrophils Relative %: 62 %
Platelet Count: 201 10*3/uL (ref 150–400)
RBC: 3.92 MIL/uL (ref 3.87–5.11)
RDW: 13.8 % (ref 11.5–15.5)
WBC Count: 5.9 10*3/uL (ref 4.0–10.5)
nRBC: 0 % (ref 0.0–0.2)

## 2022-05-19 LAB — CEA (IN HOUSE-CHCC): CEA (CHCC-In House): 3.09 ng/mL (ref 0.00–5.00)

## 2022-05-19 NOTE — Progress Notes (Signed)
Jamie [name]   [phone] [phone]   Clinic Follow up Note   Patient Care Team: Hoyt Koch, MD as PCP - General (Internal Medicine) Ileana Roup, MD as Consulting Physician (General Surgery) Truitt Merle, MD as Consulting Physician (Hematology) Gatha Mayer, MD as Consulting Physician (Gastroenterology) Lonia Skinner, MD as Consulting Physician (Ophthalmology)  Date of Service:  05/19/2022  CHIEF COMPLAINT: f/u of right colon cancer  CURRENT THERAPY:  Surveillance  ASSESSMENT & PLAN:  Jamie [name] is a 88 y.o. female with   1. Right colon cancer, pT3N0M0, stage IIA, MSI-H -presented with abdominal pain, CT AP 09/26/19 showed cecal mass. Colonoscopy 10/04/19 confirmed cecal adenocarcinoma. -s/p curative right hemicolectomy with Dr. Dema Severin on 12/15/19, path showed 7cm tumor, 0/19 lymph nodes. -MMR show MLH1 and PMS2 loss, and MLH hypermethylation positive, supports sporadic cancer, not Lynch syndrome.   -Given her early stage cancer with complete surgical resection, I did not recommend adjuvant chemotherapy. She is currently on surveillance.  -Surveillance colonoscopy on 03/18/21 was also negative. Recall not indicated due to age. -restaging CT CAP 11/13/21 showed NED. -She had a CT cardiac scoring scan on Apr 17, 2022, which showed no adenopathy or evidence of cancer recurrence. I reviewed with her -She is clinically doing well, exam was unremarkable.  Labs today reviewed, unremarkable.  There is no clinical concern for recurrence. -Will continue 5 year surveillance plan.    2. LUL pulmonary embolism -Seen on 10/2019 CT chest, she was asymptomatic. Lower extremely Doppler was negative for DVT -She was treated with Eliquis 2.72m for 4-5 months, off now    3. HTN, GERD, Arthritis  -Continue to f/u with PCP, BP not well controlled  -She has arthritis mainly in knees and hands -She ambulates with walker and cane.    4.  Hypokalemia  -If she continues Lasix she may need to continue oral potassium.  -K 3.5 today (05/19/22)      PLAN:  -lab and f/u with NP Lacie in 1 year   No problem-specific Assessment & Plan notes found for this encounter.   SUMMARY OF ONCOLOGIC HISTORY: Oncology History Overview Note  Cancer Staging Adenocarcinoma of cecum ([facility] Staging form: Colon and Rectum, AJCC 8th Edition - Clinical: Stage Unknown (cTX, cN0, cM0) - Signed by FTruitt Merle MD on 10/17/2019 - Pathologic stage from 12/15/2019: Stage IIA (pT3, pN0, cM0) - Signed by FTruitt Merle MD on 01/14/2020    Adenocarcinoma of cecum ([facility]  09/26/2019 Imaging   CT AP W Contrast  IMPRESSION: Large cecal mass is noted concerning for malignancy. Colonoscopy is recommended for further evaluation. These results will be called to the ordering clinician or representative by the Radiologist Assistant, and communication documented in the PACS or zVision Dashboard.   Aortic Atherosclerosis (ICD10-I70.0).   09/28/2019 Initial Diagnosis   Adenocarcinoma of cecum ([facility]   10/04/2019 Pathology Results   Cecal mass biopsy showed adenocarcinoma   10/04/2019 Procedure   -Malignant tumor in the cecum. Biopsied. - Diverticulosis in the sigmoid colon. - The examination was otherwise normal   10/04/2019 Miscellaneous   MMR showed loss of MLH1 and PMS2 expression    10/10/2019 Imaging   CT chest W contrast  IMPRESSION: 1. Small volume nonocclusive pulmonary embolus identified in segmental left upper lobe pulmonary artery and subsegmental pulmonary artery to the left lower lobe. Left lower lobe embolus has a string like configuration suggesting that these findings may well be subacute to chronic. Probable trace nonobstructive  embolus in a subsegmental artery to the right lower lobe. 2. No evidence for metastatic disease in the thorax. 3.  Aortic Atherosclerois (ICD10-170.0)   10/17/2019 Cancer Staging   Staging form: Colon and Rectum,  AJCC 8th Edition - Clinical: Stage Unknown (cTX, cN0, cM0) - Signed by Truitt Merle, MD on 10/17/2019   12/15/2019 Surgery   LAPAROSCOPIC RIGHT HEMI COLECTOMY WITH LYSIS OF ADHESIONS by Dr. Dema Severin     12/15/2019 Pathology Results   FINAL MICROSCOPIC DIAGNOSIS:   A. COLON, RIGHT, RESECTION:  -  Adenocarcinoma, moderately differentiated, 7 cm  -  Carcinoma invades through muscularis propria into pericolorectal  tissue  -  No carcinoma identified in nineteen (0/19)  -  Margins uninvolved by carcinoma  -  Appendix uninvolved by carcinoma  -  Tubular adenoma, incidental (0.3 cm)  -  See oncology table and comment below    12/15/2019 Cancer Staging   Staging form: Colon and Rectum, AJCC 8th Edition - Pathologic stage from 12/15/2019: Stage IIA (pT3, pN0, cM0) - Signed by Truitt Merle, MD on 01/14/2020   11/15/2020 Imaging   CT CAP  IMPRESSION: No evidence of recurrent or metastatic carcinoma within the chest, abdomen, or pelvis.   Colonic diverticulosis. No radiographic evidence of diverticulitis.   Aortic and coronary atherosclerotic calcification.   03/18/2021 Procedure   Colonoscopy, surveillance  Impression: - Diverticulosis in the sigmoid colon. - Three diminutive polyps in the rectum and in the sigmoid colon, removed with a cold biopsy forceps. Resected and retrieved. - Patent end-to-side ileo-colonic anastomosis, characterized by healthy appearing mucosa.   03/18/2021 Pathology Results   Diagnosis 1. Surgical [P], colon, sigmoid, polyp (1) - POLYPOID, INFLAMED GRANULATION TISSUE. - NO ADENOMATOUS CHANGE OR CARCINOMA. 2. Surgical [P], colon, rectum, polyp (2) - HYPERPLASTIC POLYP (TWO) - NO ADENOMATOUS CHANGE OR CARCINOMA.   11/13/2021 Imaging   EXAM: CT CHEST, ABDOMEN, AND PELVIS WITH CONTRAST  IMPRESSION: 1. No evidence of recurrence or metastatic disease within the chest, abdomen, or pelvis. 2. Colonic diverticulosis without findings of acute diverticulitis. 3.  Three-vessel coronary artery disease. Please note that although the presence of coronary artery calcium documents the presence of coronary artery disease, the severity of this disease and any potential stenosis cannot be assessed on this non-gated CT examination. Assessment for potential risk factor modification, dietary therapy or pharmacologic therapy may be warranted, if clinically indicated 4.  Aortic Atherosclerosis (ICD10-I70.0).      INTERVAL HISTORY:  Jamie [name] is here for a follow up of colon cancer. She was last seen by me on 11/15/21. She presents to the clinic alone. She reports her children are waiting for her. She reports she is doing well overall, no new GI concerns.   All other systems were reviewed with the patient and are negative.  MEDICAL HISTORY:  Past Medical History:  Diagnosis Date   Adenocarcinoma of cecum ([place]) 09/28/2019   ANEMIA, IRON DEFICIENCY 09/10/2007   Anxiety    Arthritis    Asthma    Blood transfusion without reported diagnosis    Cataract 2008   bilateral cataract extraction   CATARACT EXTRACTION, HX OF 09/10/2007   DYSPEPSIA, HX OF 09/10/2007   GERD (gastroesophageal reflux disease)    HAIR LOSS 03/21/2009   HAY FEVER 09/10/2007   HEARING LOSS, SENSORINEURAL 09/10/2007   HYPERLIPIDEMIA 09/10/2007   HYPERTENSION 09/10/2007   LOW BACK PAIN 05/10/2008   Pain in joint, hand 03/01/2008   PEPTIC ULCER DISEASE 09/10/2007   POLYP, COLON 09/10/2007  POLYPECTOMY, HX OF 09/10/2007   Pulmonary embolism ([place]) 1- 2 non-occlusive 10/10/2019   SHINGLES 08/13/2009   SHOULDER PAIN, LEFT 02/12/2008   TAH/BSO, HX OF 09/10/2007   Tuberculosis    PATIENT DENIES AT PHONE INTERVIEW ON 12/12/2019    SURGICAL HISTORY: Past Surgical History:  Procedure Laterality Date   ABDOMINAL HYSTERECTOMY     BREAST SURGERY     breast biopsy-benign   CATARACT EXTRACTION     COLONOSCOPY     EYE SURGERY     bilateral cataract with lens implant   IRRIGATION  AND DEBRIDEMENT SEBACEOUS CYST     LAPAROSCOPIC RIGHT HEMI COLECTOMY Right 12/15/2019   Procedure: LAPAROSCOPIC RIGHT HEMI COLECTOMY WITH LYSIS OF ADHESIONS;  Surgeon: Ileana Roup, MD;  Location: [facility];  Service: General;  Laterality: Right;   TOTAL ABDOMINAL HYSTERECTOMY W/ BILATERAL SALPINGOOPHORECTOMY      I have reviewed the social history and family history with the patient and they are unchanged from previous note.  ALLERGIES:  is allergic to aspirin and latex.  MEDICATIONS:  Current Outpatient Medications  Medication Sig Dispense Refill   acetaminophen (TYLENOL) 500 MG tablet Take 500-1,000 mg by mouth every 6 (six) hours as needed (for pain.).     albuterol (VENTOLIN HFA) 108 (90 Base) MCG/ACT inhaler INHALE 2 PUFFS BY MOUTH EVERY 4 HOURS AS NEEDED FOR WHEEZE OR FOR SHORTNESS OF BREATH 25.5 each 2   ALPRAZolam (XANAX) 0.5 MG tablet TAKE 1 TABLET BY MOUTH EVERY DAY AS NEEDED FOR ANXIETY 20 tablet 0   alum & mag hydroxide-simeth (MAALOX/MYLANTA) 200-200-20 MG/5ML suspension Take 15 mLs by mouth every 6 (six) hours as needed for indigestion or heartburn.     amLODipine (NORVASC) 5 MG tablet TAKE 1 TABLET BY MOUTH EVERY DAY 90 tablet 1   atorvastatin (LIPITOR) 20 MG tablet TAKE 1 TABLET BY MOUTH EVERY DAY 90 tablet 1   benzonatate (TESSALON) 200 MG capsule TAKE 1 CAPSULE BY MOUTH THREE TIMES A DAY AS NEEDED 60 capsule 0   betamethasone dipropionate 0.05 % cream APPLY TOPICALLY TWICE A DAY 45 g 0   BREO ELLIPTA 100-25 MCG/ACT AEPB INHALE 1 PUFF BY MOUTH EVERY DAY 60 each 3   famotidine (PEPCID) 20 MG tablet TAKE 1 TABLET BY MOUTH TWICE A DAY 180 tablet 1   furosemide (LASIX) 40 MG tablet TAKE 1 TABLET BY MOUTH EVERY DAY 90 tablet 1   montelukast (SINGULAIR) 10 MG tablet TAKE 1 TABLET BY MOUTH EVERYDAY AT BEDTIME 90 tablet 1   olmesartan (BENICAR) 40 MG tablet TAKE 1 TABLET BY MOUTH EVERY DAY 90 tablet 3   ondansetron (ZOFRAN-ODT) 4 MG disintegrating tablet TAKE 1 TABLET BY MOUTH  EVERY 8 HOURS AS NEEDED FOR NAUSEA AND VOMITING 60 tablet 0   potassium chloride (KLOR-CON) 10 MEQ tablet TAKE 1 TABLET BY MOUTH EVERY DAY 90 tablet 1   predniSONE (DELTASONE) 20 MG tablet Take 2 tablets (40 mg total) by mouth daily with breakfast. 10 tablet 0   sennosides-docusate sodium (SENOKOT-S) 8.6-50 MG tablet Take 1 tablet by mouth daily as needed for constipation.     triamcinolone cream (KENALOG) 0.1 % APPLY TOPICALLY 2 (TWO) TIMES DAILY AS NEEDED (SKIN IRRITATION/RASH). 90 g 0   No current facility-administered medications for this visit.    PHYSICAL EXAMINATION: ECOG PERFORMANCE STATUS: 0 - Asymptomatic  Vitals:   05/19/22 1320  BP: (!) 148/62  Pulse: 78  Resp: 18  Temp: 98.6 F (37 C)  SpO2: 100%  Wt Readings from Last 3 Encounters:  05/19/22 174 lb 6.4 oz (79.1 kg)  03/12/22 172 lb 12.8 oz (78.4 kg)  02/21/22 173 lb (78.5 kg)     GENERAL:alert, no distress and comfortable SKIN: skin color normal, no rashes or significant lesions EYES: normal, Conjunctiva are pink and non-injected, sclera clear  NEURO: alert & oriented x 3 with fluent speech  LABORATORY DATA:  I have reviewed the data as listed    Latest Ref Rng & Units 05/19/2022   12:48 PM 02/09/2022    3:51 PM 11/13/2021    3:06 PM  CBC  WBC 4.0 - 10.5 K/uL 5.9  5.8  7.0   Hemoglobin 12.0 - 15.0 g/dL 12.6  13.8  12.8   Hematocrit 36.0 - 46.0 % 37.3  44.1  38.6   Platelets 150 - 400 K/uL 201  234  230         Latest Ref Rng & Units 05/19/2022   12:48 PM 02/09/2022    3:51 PM 11/13/2021    3:06 PM  CMP  Glucose 70 - 99 mg/dL 129  83  90   BUN 8 - 23 mg/dL _0 Creatinine 0.44 - 1.00 mg/dL 0.92  0.75  1.01   Sodium 135 - 145 mmol/L 142  141  142   Potassium 3.5 - 5.1 mmol/L 3.5  3.3  4.0   Chloride 98 - 111 mmol/L 110  109  109   CO2 22 - 32 mmol/L _1 Calcium 8.9 - 10.3 mg/dL 9.1  9.2  9.2   Total Protein 6.5 - 8.1 g/dL 7.1   8.1   Total Bilirubin 0.3 - 1.2 mg/dL 0.5   0.5    Alkaline Phos 38 - 126 U/L 170   194   AST 15 - 41 U/L 15   15   ALT 0 - 44 U/L 12   11       RADIOGRAPHIC STUDIES: I have personally reviewed the radiological images as listed and agreed with the findings in the report. No results found.    No orders of the defined types were placed in this encounter.  All questions were answered. The patient knows to call the clinic with any problems, questions or concerns. No barriers to learning was detected. The total time spent in the appointment was 25 minutes.     Truitt Merle, MD 05/19/2022   I, Wilburn Mylar, am acting as scribe for Truitt Merle, MD.   I have reviewed the above documentation for accuracy and completeness, and I agree with the above.

## 2022-06-02 ENCOUNTER — Other Ambulatory Visit: Payer: Self-pay | Admitting: Internal Medicine

## 2022-06-11 ENCOUNTER — Encounter: Payer: Self-pay | Admitting: Internal Medicine

## 2022-06-11 DIAGNOSIS — Z1231 Encounter for screening mammogram for malignant neoplasm of breast: Secondary | ICD-10-CM | POA: Diagnosis not present

## 2022-06-20 ENCOUNTER — Encounter: Payer: Self-pay | Admitting: Internal Medicine

## 2022-06-20 ENCOUNTER — Ambulatory Visit: Payer: Medicare PPO | Admitting: Internal Medicine

## 2022-06-20 VITALS — BP 128/60 | HR 76 | Resp 18 | Ht 63.0 in | Wt 172.6 lb

## 2022-06-20 DIAGNOSIS — R931 Abnormal findings on diagnostic imaging of heart and coronary circulation: Secondary | ICD-10-CM

## 2022-06-20 DIAGNOSIS — J4531 Mild persistent asthma with (acute) exacerbation: Secondary | ICD-10-CM

## 2022-06-20 MED ORDER — DOXYCYCLINE HYCLATE 100 MG PO TABS: 100.0000 mg | ORAL_TABLET | Freq: Two times a day (BID) | ORAL | 0 refills | Status: DC

## 2022-06-20 MED ORDER — PREDNISONE 20 MG PO TABS
40.0000 mg | ORAL_TABLET | Freq: Every day | ORAL | 0 refills | Status: DC
Start: 2022-06-20 — End: 2022-07-10

## 2022-06-20 MED ORDER — NYSTATIN 100000 UNIT/GM EX CREA: 1.0000 | TOPICAL_CREAM | Freq: Two times a day (BID) | CUTANEOUS | 0 refills | Status: AC

## 2022-06-20 NOTE — Patient Instructions (Signed)
We have sent in doxycycline to take 1 pill twice a day for 1 week.   We have sent in prednisone to take 2 pills daily for 5 days.

## 2022-06-20 NOTE — Assessment & Plan Note (Signed)
With exacerbation and continue breo and albuterol prn. Add doxycycline and prednisone to cover for likely infection causing flare.

## 2022-06-20 NOTE — Progress Notes (Signed)
   Subjective:   Patient ID: Jamie [name], female    DOB: 06-30-1933, 88 y.o.   MRN: [id number]  HPI The patient is an 88 YO female coming in for wheezing and cough 2-3 weeks. Overall not improving not worsening.   Review of Systems  Constitutional:  Positive for activity change and appetite change. Negative for chills, fatigue, fever and unexpected weight change.  HENT:  Positive for congestion. Negative for ear discharge, ear pain, postnasal drip, rhinorrhea, sinus pressure, sinus pain, sneezing, sore throat, tinnitus, trouble swallowing and voice change.   Eyes: Negative.   Respiratory:  Positive for cough, shortness of breath and wheezing. Negative for chest tightness.   Cardiovascular: Negative.   Gastrointestinal: Negative.   Musculoskeletal:  Negative for myalgias.  Neurological: Negative.     Objective:  Physical Exam Constitutional:      Appearance: She is well-developed.  HENT:     Head: Normocephalic and atraumatic.  Cardiovascular:     Rate and Rhythm: Normal rate and regular rhythm.  Pulmonary:     Effort: Pulmonary effort is normal. No respiratory distress.     Breath sounds: Wheezing and rhonchi present. No rales.     Comments: Right sided changes more than left Abdominal:     General: Bowel sounds are normal. There is no distension.     Palpations: Abdomen is soft.     Tenderness: There is no abdominal tenderness. There is no rebound.  Musculoskeletal:     Cervical back: Normal range of motion.  Skin:    General: Skin is warm and dry.  Neurological:     Mental Status: She is alert and oriented to person, place, and time.     Coordination: Coordination normal.     Vitals:   06/20/22 0850  BP: 128/60  Pulse: 76  Resp: 18  SpO2: 98%  Weight: 172 lb 9.6 oz (78.3 kg)  Height: '5\' 3"'$  (1.6 m)    Assessment & Plan:

## 2022-06-23 ENCOUNTER — Telehealth: Payer: Self-pay

## 2022-06-23 MED ORDER — AMOXICILLIN-POT CLAVULANATE 875-125 MG PO TABS: 1.0000 | ORAL_TABLET | Freq: Two times a day (BID) | ORAL | 0 refills | Status: AC

## 2022-06-23 NOTE — Telephone Encounter (Signed)
Sent in augmentin into take 1 pill twice a day for 1 week.

## 2022-06-23 NOTE — Telephone Encounter (Signed)
Pt is calling back after being on the medications doxycycline (VIBRA-TABS) 100 MG tablet and predniSONE (DELTASONE) 20 MG tablet.  Pt states she felt nervous, anxious, and drowsiness after taking the doxycycline (VIBRA-TABS) 100 MG. Pt only took the one dose and is requesting something different since it didn't make her feel well.  Pt is still taking predniSONE (DELTASONE) 20 MG tablet.  Please advise

## 2022-06-24 NOTE — Telephone Encounter (Signed)
Spoke with the patient and she verbalized understanding her new medication that has been sent to her pharmacy. No questions or concerns.

## 2022-06-27 ENCOUNTER — Telehealth: Payer: Self-pay | Admitting: Internal Medicine

## 2022-06-27 NOTE — Telephone Encounter (Signed)
Pt is requesting a call from the nurse. She stated she was prescribed predniSONE (DELTASONE) 20 MG tablet. She completed the rx and is currently taking amoxicillin-clavulanate (AUGMENTIN) 875-125 MG tablet. She stated she is having diarrhea and is unsure if it is a side effect of the medication.  Please advise.

## 2022-06-30 NOTE — Telephone Encounter (Signed)
Can use imodium and take probiotic. Would recommend to finish antibiotic if able. If unable let us know.

## 2022-06-30 NOTE — Telephone Encounter (Signed)
Called pt. LDVM at [phone] with Dr. Nathanial Millman recommendations. Office number was provided so that you can update [place] on if she is able to finish her antibiotic or not.

## 2022-07-10 ENCOUNTER — Encounter: Payer: Self-pay | Admitting: Internal Medicine

## 2022-07-10 ENCOUNTER — Ambulatory Visit: Payer: Medicare PPO | Admitting: Internal Medicine

## 2022-07-10 VITALS — BP 140/68 | HR 89 | Ht 63.0 in | Wt 173.6 lb

## 2022-07-10 DIAGNOSIS — C180 Malignant neoplasm of cecum: Secondary | ICD-10-CM

## 2022-07-10 DIAGNOSIS — R931 Abnormal findings on diagnostic imaging of heart and coronary circulation: Secondary | ICD-10-CM | POA: Diagnosis not present

## 2022-07-10 DIAGNOSIS — E782 Mixed hyperlipidemia: Secondary | ICD-10-CM

## 2022-07-10 DIAGNOSIS — I10 Essential (primary) hypertension: Secondary | ICD-10-CM

## 2022-07-10 DIAGNOSIS — R0609 Other forms of dyspnea: Secondary | ICD-10-CM | POA: Insufficient documentation

## 2022-07-10 LAB — LIPID PANEL
Chol/HDL Ratio: 2.7 ratio (ref 0.0–4.4)
Cholesterol, Total: 156 mg/dL (ref 100–199)
HDL: 57 mg/dL (ref >39)
LDL Chol Calc (NIH): 85 mg/dL (ref 0–99)
Triglycerides: 72 mg/dL (ref 0–149)
VLDL Cholesterol Cal: 14 mg/dL (ref 5–40)

## 2022-07-10 LAB — BASIC METABOLIC PANEL
BUN/Creatinine Ratio: 9 — ABNORMAL LOW (ref 12–28)
BUN: 7 mg/dL — ABNORMAL LOW (ref 8–27)
CO2: 22 mmol/L (ref 20–29)
Calcium: 9.1 mg/dL (ref 8.7–10.3)
Chloride: 107 mmol/L — ABNORMAL HIGH (ref 96–106)
Creatinine, Ser: 0.77 mg/dL (ref 0.57–1.00)
Glucose: 83 mg/dL (ref 70–99)
Potassium: 4 mmol/L (ref 3.5–5.2)
Sodium: 144 mmol/L (ref 134–144)
eGFR: 75 mL/min/{1.73_m2} (ref >59)

## 2022-07-10 LAB — ALT: ALT: 14 IU/L (ref 0–32)

## 2022-07-10 NOTE — Progress Notes (Signed)
Cardiology Office Note:    Date:  07/10/2022   ID:  ALLYSSA ABRUZZESE, DOB 1933/08/13, MRN [id number]  PCP:  Hoyt Koch, MD   [place] [facility] Cardiologist:  Werner Lean, MD     Referring MD: Hoyt Koch, *   CC: Worried about the scan Consulted for the evaluation of CAC at the behest of Dr. Sharlet Salina  History of Present Illness:    Jamie [name] is a 88 y.o. female with a hx of HTN, HLD, aortic atherosclerosis, found to have aortic atherosclerosis and CAC, prior hx of PE, prior colon cancer.  Patient notes that she is feeling pretty well.  . Able to walk around and clean the house and does her own cooking.  She lives at home by herself.  Has had no chest pain, chest pressure, chest tightness, chest stinging .  No shortness of breath, but DOE with allergy season; improved with inhalers .  No PND or orthopnea.  No weight gain, leg swelling , or abdominal swelling.  No syncope or near syncope . Notes  no palpitations or funny heart beats.     Patient reports prior cardiac testing including EKGs only; CAC brought her here today  She notes that she have a lot of anxiety seeing her doctors.  Her BP goes up and one time she fainted.   Past Medical History:  Diagnosis Date   Adenocarcinoma of cecum ([place]) 09/28/2019   ANEMIA, IRON DEFICIENCY 09/10/2007   Anxiety    Arthritis    Asthma    Blood transfusion without reported diagnosis    Cataract 2008   bilateral cataract extraction   CATARACT EXTRACTION, HX OF 09/10/2007   DYSPEPSIA, HX OF 09/10/2007   GERD (gastroesophageal reflux disease)    HAIR LOSS 03/21/2009   HAY FEVER 09/10/2007   HEARING LOSS, SENSORINEURAL 09/10/2007   HYPERLIPIDEMIA 09/10/2007   HYPERTENSION 09/10/2007   LOW BACK PAIN 05/10/2008   Pain in joint, hand 03/01/2008   PEPTIC ULCER DISEASE 09/10/2007   POLYP, COLON 09/10/2007   POLYPECTOMY, HX OF 09/10/2007   Pulmonary embolism ([place]) 1- 2 non-occlusive  10/10/2019   SHINGLES 08/13/2009   SHOULDER PAIN, LEFT 02/12/2008   TAH/BSO, HX OF 09/10/2007   Tuberculosis    PATIENT DENIES AT PHONE INTERVIEW ON 12/12/2019    Past Surgical History:  Procedure Laterality Date   ABDOMINAL HYSTERECTOMY     BREAST SURGERY     breast biopsy-benign   CATARACT EXTRACTION     COLONOSCOPY     EYE SURGERY     bilateral cataract with lens implant   IRRIGATION AND DEBRIDEMENT SEBACEOUS CYST     LAPAROSCOPIC RIGHT HEMI COLECTOMY Right 12/15/2019   Procedure: LAPAROSCOPIC RIGHT HEMI COLECTOMY WITH LYSIS OF ADHESIONS;  Surgeon: Ileana Roup, MD;  Location: [facility];  Service: General;  Laterality: Right;   TOTAL ABDOMINAL HYSTERECTOMY W/ BILATERAL SALPINGOOPHORECTOMY      Current Medications: Current Meds  Medication Sig   acetaminophen (TYLENOL) 500 MG tablet Take 500-1,000 mg by mouth every 6 (six) hours as needed (for pain.).   albuterol (VENTOLIN HFA) 108 (90 Base) MCG/ACT inhaler INHALE 2 PUFFS BY MOUTH EVERY 4 HOURS AS NEEDED FOR WHEEZE OR FOR SHORTNESS OF BREATH   ALPRAZolam (XANAX) 0.5 MG tablet TAKE 1 TABLET BY MOUTH EVERY DAY AS NEEDED FOR ANXIETY   alum & mag hydroxide-simeth (MAALOX/MYLANTA) 200-200-20 MG/5ML suspension Take 15 mLs by mouth every 6 (six) hours as needed for indigestion or  heartburn.   amLODipine (NORVASC) 5 MG tablet TAKE 1 TABLET BY MOUTH EVERY DAY   atorvastatin (LIPITOR) 20 MG tablet TAKE 1 TABLET BY MOUTH EVERY DAY   benzonatate (TESSALON) 200 MG capsule TAKE 1 CAPSULE BY MOUTH THREE TIMES A DAY AS NEEDED   betamethasone dipropionate 0.05 % cream APPLY TO AFFECTED AREA TWICE A DAY   BREO ELLIPTA 100-25 MCG/ACT AEPB INHALE 1 PUFF BY MOUTH EVERY DAY   famotidine (PEPCID) 20 MG tablet TAKE 1 TABLET BY MOUTH TWICE A DAY   furosemide (LASIX) 40 MG tablet TAKE 1 TABLET BY MOUTH EVERY DAY   montelukast (SINGULAIR) 10 MG tablet TAKE 1 TABLET BY MOUTH EVERYDAY AT BEDTIME (Patient taking differently: as needed.)   nystatin cream  (MYCOSTATIN) Apply 1 Application topically 2 (two) times daily. (Patient taking differently: Apply 1 Application topically as needed for dry skin.)   olmesartan (BENICAR) 40 MG tablet TAKE 1 TABLET BY MOUTH EVERY DAY   ondansetron (ZOFRAN-ODT) 4 MG disintegrating tablet TAKE 1 TABLET BY MOUTH EVERY 8 HOURS AS NEEDED FOR NAUSEA AND VOMITING   potassium chloride (KLOR-CON) 10 MEQ tablet TAKE 1 TABLET BY MOUTH EVERY DAY   sennosides-docusate sodium (SENOKOT-S) 8.6-50 MG tablet Take 1 tablet by mouth daily as needed for constipation.   triamcinolone cream (KENALOG) 0.1 % APPLY TOPICALLY 2 (TWO) TIMES DAILY AS NEEDED (SKIN IRRITATION/RASH).     Allergies:   Aspirin and Latex   Social History   Socioeconomic History   Marital status: Widowed    Spouse name: Not on file   Number of children: 4   Years of education: Not on file   Highest education level: Not on file  Occupational History   Occupation: retired  Tobacco Use   Smoking status: Former    Packs/day: 0.20    Years: 25.00    Total pack years: 5.00    Types: Cigarettes    Quit date: 12/01/1989    Years since quitting: 32.6   Smokeless tobacco: Never   Tobacco comments:    smoked a long time ago; Quit 6 or 91/ number is approximate  Vaping Use   Vaping Use: Never used  Substance and Sexual Activity   Alcohol use: No    Alcohol/week: 0.0 standard drinks of alcohol   Drug use: No   Sexual activity: Not Currently  Other Topics Concern   Not on file  Social History Narrative   3 sons, 44,59, 62 and 1 daughter 60, 4 grandchildren   SO with multiple medical problems   Social Determinants of Health   Financial Resource Strain: Low Risk  (03/11/2022)   Overall Financial Resource Strain (CARDIA)    Difficulty of Paying Living Expenses: Not hard at all  Food Insecurity: No Food Insecurity (03/11/2022)   Hunger Vital Sign    Worried About Running Out of Food in the Last Year: Never true    [place] in the Last Year: Never  true  Transportation Needs: No Transportation Needs (03/11/2022)   PRAPARE - Hydrologist (Medical): No    Lack of Transportation (Non-Medical): No  Physical Activity: Inactive (03/11/2022)   Exercise Vital Sign    Days of Exercise per Week: 0 days    Minutes of Exercise per Session: 0 min  Stress: No Stress Concern Present (03/11/2022)   [place]    Feeling of Stress : Not at all  Social Connections: Moderately Isolated (  03/11/2022)   Social Connection and Isolation Panel [NHANES]    Frequency of Communication with Friends and Family: More than three times a week    Frequency of Social Gatherings with Friends and Family: More than three times a week    Attends Religious Services: More than 4 times per year    Active Member of [employer] or Organizations: No    Attends Archivist Meetings: Never    Marital Status: Widowed     Family History: The patient's family history includes Cancer (age of onset: 22) in her son and son; Coronary artery disease in her mother; Heart disease in her mother; Hypertension in her mother. There is no history of Diabetes.  ROS:   Please see the history of present illness.     All other systems reviewed and are negative.  EKGs/Labs/Other Studies Reviewed:    The following studies were reviewed today:   EKG:  EKG is  ordered today.  The ekg ordered today demonstrates  07/10/22: SR 89   CT CARDIAC SCORING (SELF PAY ONLY) 04/17/2022  Addendum 04/17/2022  2:12 PM ADDENDUM REPORT: 04/17/2022 14:09  EXAM: OVER-READ INTERPRETATION  CT CHEST  The following report is an over-read performed by radiologist Dr. [facility] on 04/17/2022. This over-read does not include interpretation of cardiac or coronary anatomy or pathology. The cardiac interpretation by the cardiologist is attached.  COMPARISON:  Chest CT  11/13/2021  FINDINGS: The thoracic aorta is normal in caliber.  No pericardial effusion.  No mediastinal or hilar mass or lymphadenopathy. The visualized esophagus is grossly.  No acute pulmonary findings or worrisome pulmonary lesions. The central tracheobronchial tree is unremarkable. No pleural effusion.  No significant upper abdominal findings.  The bony structures are intact.  IMPRESSION: No significant findings extracardiac findings.   Electronically Signed By: Marijo Sanes M.D. On: 04/17/2022 14:09  Narrative CLINICAL DATA:  Risk stratification: 88 year old African American Female  EXAM: Coronary Calcium Score  TECHNIQUE: The patient was scanned on a [employer]. Axial non-contrast 3 mm slices were carried out through the heart. The data set was analyzed on a dedicated work station and scored using the [place].  FINDINGS: Non-cardiac: See separate report from [facility].  Ascending Aorta: Normal caliber.  Aortic atherosclerosis.  Pericardium: There is mild pericardial calcification posterior to the anterolateral wall of the left ventricle.  Coronary arteries: Normal origins.  Coronary Calcium Score:  Left main: 12  Left anterior descending artery: 864  Left circumflex artery: 742  Right coronary artery: 546  Total: 2164  IMPRESSION: 1. Coronary calcium score of 2164- elevated for age and gender.  2.  Aortic atherosclerosis.  3.  Mild pericardial calcification.   Recent Labs: 02/09/2022: Magnesium 2.2 05/19/2022: ALT 12; BUN 10; Creatinine 0.92; Hemoglobin 12.6; Platelet Count 201; Potassium 3.5; Sodium 142  Recent Lipid Panel    Component Value Date/Time   CHOL 149 06/05/2021 0851   TRIG 62.0 06/05/2021 0851   HDL 51.50 06/05/2021 0851   CHOLHDL 3 06/05/2021 0851   VLDL 12.4 06/05/2021 0851   LDLCALC 85 06/05/2021 0851        Physical Exam:    VS:  BP (!) 140/68   Pulse 89   Ht '5\' 3"'$  (1.6 m)   Wt  173 lb 9.6 oz (78.7 kg)   SpO2 99%   BMI 30.75 kg/m     BP: 140/72  Wt Readings from Last 3 Encounters:  07/10/22 173 lb  9.6 oz (78.7 kg)  06/20/22 172 lb 9.6 oz (78.3 kg)  05/19/22 174 lb 6.4 oz (79.1 kg)    GEN:  Well nourished, well developed in no acute distress HEENT: Normal NECK: No JVD; No carotid bruits LYMPHATICS: No lymphadenopathy CARDIAC: RRR, no murmurs, rubs, gallops RESPIRATORY:  Clear to auscultation without rales, wheezing or rhonchi  ABDOMEN: Soft, non-tender, non-distended MUSCULOSKELETAL:  No edema; No deformity  SKIN: Warm and dry NEUROLOGIC:  Alert and oriented x 3 PSYCHIATRIC:  Anxious  ASSESSMENT:    1. DOE (dyspnea on exertion)   2. Elevated coronary artery calcium score   3. Essential hypertension   4. Mixed hyperlipidemia   5. Adenocarcinoma of cecum (HCC)    PLAN:     Elevated CAC Aortic atherosclerosis HLD Pericardal calcification- may be incidental, no TB risk factors or sarcoid hx; no immunosuppression she is in monitoring for her colon cancer - on atorvastatin 20 mg; will check lipids and ALT today - with her anxiety and DOE will do lexiscan  HTN Anxiety - on norvasx 5, benicar 40 mg; BP elevated but she is notably anxious; tearing up through exam; she is worried about her heart - she takes las 40 mg no K; will check BMP today  6 months with me or APP         Medication Adjustments/Labs and Tests Ordered: Current medicines are reviewed at length with the patient today.  Concerns regarding medicines are outlined above.  Orders Placed This Encounter  Procedures   Lipid panel   Basic metabolic panel   ALT   MYOCARDIAL PERFUSION IMAGING   EKG 12-Lead   No orders of the defined types were placed in this encounter.   Patient Instructions  Medication Instructions:  Your physician recommends that you continue on your current medications as directed. Please refer to the Current Medication list given to you today.  *If you  need a refill on your cardiac medications before your next appointment, please call your pharmacy*   Lab Work: TODAY: Lipid panel, BMP, ALT   If you have labs (blood work) drawn today and your tests are completely normal, you will receive your results only by: [place] (if you have MyChart) OR A paper copy in the mail If you have any lab test that is abnormal or we need to change your treatment, we will call you to review the results.   Testing/Procedures: Your physician has requested that you have en exercise stress myoview. For further information please visit [email or web address]. Please follow instruction sheet, as given.    Follow-Up: At [facility], you and your health needs are our priority.  As part of our continuing mission to provide you with exceptional heart care, we have created designated Provider Care Teams.  These Care Teams include your primary Cardiologist (physician) and Advanced Practice Providers (APPs -  Physician Assistants and Nurse Practitioners) who all work together to provide you with the care you need, when you need it.  We recommend signing up for the patient portal called "MyChart".  Sign up information is provided on this After Visit Summary.  MyChart is used to connect with patients for Virtual Visits (Telemedicine).  Patients are able to view lab/test results, encounter notes, upcoming appointments, etc.  Non-urgent messages can be sent to your provider as well.   To learn more about what you can do with MyChart, go to [email or web address].    Your next appointment:   6 month(s)  The format for your next  appointment:   In Person  Provider:   Werner Lean, MD    Important Information About Sugar         Signed, Werner Lean, MD  07/10/2022 11:27 AM    [place]

## 2022-07-10 NOTE — Patient Instructions (Signed)
Medication Instructions:  Your physician recommends that you continue on your current medications as directed. Please refer to the Current Medication list given to you today.  *If you need a refill on your cardiac medications before your next appointment, please call your pharmacy*   Lab Work: TODAY: Lipid panel, BMP, ALT   If you have labs (blood work) drawn today and your tests are completely normal, you will receive your results only by: [place] (if you have MyChart) OR A paper copy in the mail If you have any lab test that is abnormal or we need to change your treatment, we will call you to review the results.   Testing/Procedures: Your physician has requested that you have en exercise stress myoview. For further information please visit [email or web address]. Please follow instruction sheet, as given.    Follow-Up: At [facility], you and your health needs are our priority.  As part of our continuing mission to provide you with exceptional heart care, we have created designated Provider Care Teams.  These Care Teams include your primary Cardiologist (physician) and Advanced Practice Providers (APPs -  Physician Assistants and Nurse Practitioners) who all work together to provide you with the care you need, when you need it.  We recommend signing up for the patient portal called "MyChart".  Sign up information is provided on this After Visit Summary.  MyChart is used to connect with patients for Virtual Visits (Telemedicine).  Patients are able to view lab/test results, encounter notes, upcoming appointments, etc.  Non-urgent messages can be sent to your provider as well.   To learn more about what you can do with MyChart, go to [email or web address].    Your next appointment:   6 month(s)  The format for your next appointment:   In Person  Provider:   Werner Lean, MD    Important Information About Sugar

## 2022-07-11 ENCOUNTER — Telehealth: Payer: Self-pay

## 2022-07-11 DIAGNOSIS — E782 Mixed hyperlipidemia: Secondary | ICD-10-CM

## 2022-07-11 MED ORDER — ATORVASTATIN CALCIUM 40 MG PO TABS: 40.0000 mg | ORAL_TABLET | Freq: Every day | ORAL | 3 refills | Status: DC

## 2022-07-11 NOTE — Telephone Encounter (Signed)
The patient has been notified of the result and verbalized understanding.  All questions (if any) were answered. Antonieta Iba, RN 07/11/2022 10:30 AM  Repeat labs have been scheduled.

## 2022-07-11 NOTE — Telephone Encounter (Signed)
-----   Message from Werner Lean, MD sent at 07/11/2022  9:16 AM EDT ----- Results: K is normal on lasix LDL above goal Plan: Increase atorvastatin to 40 mg, monitor for myalgias, Lipids and ALT in three months  Werner Lean, MD

## 2022-07-14 ENCOUNTER — Telehealth (HOSPITAL_COMMUNITY): Payer: Self-pay | Admitting: *Deleted

## 2022-07-14 NOTE — Telephone Encounter (Signed)
Patient given detailed instructions per Myocardial Perfusion Study Information Sheet for the test on 07/16/2022 at 7:45. Patient notified to arrive 15 minutes early and that it is imperative to arrive on time for appointment to keep from having the test rescheduled.  If you need to cancel or reschedule your appointment, please call the office within 24 hours of your appointment. . Patient verbalized understanding.Jamie [name]

## 2022-07-16 ENCOUNTER — Ambulatory Visit (HOSPITAL_COMMUNITY): Payer: Medicare PPO | Attending: Internal Medicine

## 2022-07-16 DIAGNOSIS — R931 Abnormal findings on diagnostic imaging of heart and coronary circulation: Secondary | ICD-10-CM | POA: Insufficient documentation

## 2022-07-16 DIAGNOSIS — C180 Malignant neoplasm of cecum: Secondary | ICD-10-CM | POA: Diagnosis not present

## 2022-07-16 DIAGNOSIS — R0609 Other forms of dyspnea: Secondary | ICD-10-CM | POA: Insufficient documentation

## 2022-07-16 DIAGNOSIS — I10 Essential (primary) hypertension: Secondary | ICD-10-CM | POA: Insufficient documentation

## 2022-07-16 DIAGNOSIS — E782 Mixed hyperlipidemia: Secondary | ICD-10-CM | POA: Insufficient documentation

## 2022-07-16 LAB — MYOCARDIAL PERFUSION IMAGING
LV dias vol: 61 mL (ref 46–106)
LV sys vol: 19 mL
Nuc Stress EF: 69 %
Peak HR: 112 {beats}/min
Rest HR: 77 {beats}/min
Rest Nuclear Isotope Dose: 10.9 mCi
SDS: 1
SRS: 0
SSS: 1
ST Depression (mm): 0 mm
Stress Nuclear Isotope Dose: 32.6 mCi
TID: 0.99

## 2022-07-16 MED ORDER — REGADENOSON 0.4 MG/5ML IV SOLN: 0.4000 mg | Freq: Once | INTRAVENOUS | Status: DC

## 2022-07-16 MED ORDER — TECHNETIUM TC 99M TETROFOSMIN IV KIT
10.9000 | PACK | Freq: Once | INTRAVENOUS | Status: AC | PRN
  Administered 2022-07-16: 10.9 via INTRAVENOUS

## 2022-07-16 MED ORDER — TECHNETIUM TC 99M TETROFOSMIN IV KIT: 32.6000 | PACK | Freq: Once | INTRAVENOUS | Status: DC | PRN

## 2022-07-19 ENCOUNTER — Other Ambulatory Visit: Payer: Self-pay | Admitting: Internal Medicine

## 2022-07-29 ENCOUNTER — Telehealth: Payer: Self-pay | Admitting: Internal Medicine

## 2022-07-31 NOTE — Telephone Encounter (Signed)
Pt is calling for an up date on refill

## 2022-08-01 ENCOUNTER — Telehealth: Payer: Self-pay | Admitting: Internal Medicine

## 2022-08-01 ENCOUNTER — Other Ambulatory Visit: Payer: Self-pay

## 2022-08-01 NOTE — Telephone Encounter (Signed)
Patient needs her Adair Patter refilled - please send to [address], [place].  Patient states that she is completely out of medication.

## 2022-08-26 ENCOUNTER — Ambulatory Visit: Payer: Medicare PPO | Admitting: Internal Medicine

## 2022-08-26 DIAGNOSIS — J4531 Mild persistent asthma with (acute) exacerbation: Secondary | ICD-10-CM

## 2022-08-26 MED ORDER — ALBUTEROL SULFATE (2.5 MG/3ML) 0.083% IN NEBU: 2.5000 mg | INHALATION_SOLUTION | Freq: Once | RESPIRATORY_TRACT | Status: DC

## 2022-08-26 MED ORDER — BENZONATATE 200 MG PO CAPS
200.0000 mg | ORAL_CAPSULE | Freq: Three times a day (TID) | ORAL | 0 refills | Status: DC | PRN
Start: 2022-08-26 — End: 2022-10-17

## 2022-08-26 MED ORDER — DOXYCYCLINE HYCLATE 100 MG PO TABS: 100.0000 mg | ORAL_TABLET | Freq: Two times a day (BID) | ORAL | 0 refills | Status: DC

## 2022-08-26 MED ORDER — PREDNISONE 20 MG PO TABS: 40.0000 mg | ORAL_TABLET | Freq: Every day | ORAL | 0 refills | Status: DC

## 2022-08-26 NOTE — Assessment & Plan Note (Signed)
With exacerbation today. Given albuterol nebulizer treatment in office. Rx prednisone 5 day course and doxycycline 1 week course. Refilled tessalon perles for cough. Follow up if no improvement or if worsening.

## 2022-08-26 NOTE — Progress Notes (Unsigned)
   Subjective:   Patient ID: Jamie [name], female    DOB: 10/22/1933, 88 y.o.   MRN: [id number]  HPI The patient is an 88 YO female coming in for sick visit. Has asthma and wheezing and coughing.  Review of Systems  Constitutional:  Positive for activity change and appetite change.  HENT: Negative.    Eyes: Negative.   Respiratory:  Positive for cough, shortness of breath and wheezing. Negative for chest tightness.   Cardiovascular:  Negative for chest pain, palpitations and leg swelling.  Gastrointestinal:  Negative for abdominal distention, abdominal pain, constipation, diarrhea, nausea and vomiting.  Musculoskeletal: Negative.   Skin: Negative.   Neurological: Negative.   Psychiatric/Behavioral: Negative.      Objective:  Physical Exam Constitutional:      Appearance: She is well-developed.  HENT:     Head: Normocephalic and atraumatic.  Cardiovascular:     Rate and Rhythm: Normal rate and regular rhythm.  Pulmonary:     Effort: Pulmonary effort is normal. No respiratory distress.     Breath sounds: Wheezing and rhonchi present. No rales.  Abdominal:     General: Bowel sounds are normal. There is no distension.     Palpations: Abdomen is soft.     Tenderness: There is no abdominal tenderness. There is no rebound.  Musculoskeletal:     Cervical back: Normal range of motion.  Skin:    General: Skin is warm and dry.  Neurological:     Mental Status: She is alert and oriented to person, place, and time.     Coordination: Coordination abnormal.     Vitals:   08/26/22 0806  BP: 132/60  Pulse: 90  Temp: 98.6 F (37 C)  SpO2: 98%  Weight: 173 lb (78.5 kg)  Height: '5\' 3"'$  (1.6 m)   Albuterol nebulizer treatment given during visit  Assessment & Plan:

## 2022-08-26 NOTE — Patient Instructions (Signed)
We have sent in prednisone to take 2 pills daily for 5 days.   We have sent in doxycycline to take 1 pill twice a day for 1 week which is the antibiotics.  We have refilled the tessalon perles.

## 2022-08-27 ENCOUNTER — Encounter: Payer: Self-pay | Admitting: Internal Medicine

## 2022-09-17 DIAGNOSIS — H35033 Hypertensive retinopathy, bilateral: Secondary | ICD-10-CM | POA: Diagnosis not present

## 2022-09-17 DIAGNOSIS — Z961 Presence of intraocular lens: Secondary | ICD-10-CM | POA: Diagnosis not present

## 2022-09-17 DIAGNOSIS — H43393 Other vitreous opacities, bilateral: Secondary | ICD-10-CM | POA: Diagnosis not present

## 2022-09-17 DIAGNOSIS — H04123 Dry eye syndrome of bilateral lacrimal glands: Secondary | ICD-10-CM | POA: Diagnosis not present

## 2022-09-23 ENCOUNTER — Other Ambulatory Visit: Payer: Self-pay | Admitting: Internal Medicine

## 2022-10-13 ENCOUNTER — Ambulatory Visit: Payer: Medicare PPO | Attending: Internal Medicine

## 2022-10-13 DIAGNOSIS — E782 Mixed hyperlipidemia: Secondary | ICD-10-CM | POA: Diagnosis not present

## 2022-10-13 LAB — LIPID PANEL
Chol/HDL Ratio: 2.7 ratio (ref 0.0–4.4)
Cholesterol, Total: 135 mg/dL (ref 100–199)
HDL: 50 mg/dL (ref >39)
LDL Chol Calc (NIH): 74 mg/dL (ref 0–99)
Triglycerides: 47 mg/dL (ref 0–149)
VLDL Cholesterol Cal: 11 mg/dL (ref 5–40)

## 2022-10-13 LAB — ALT: ALT: 13 IU/L (ref 0–32)

## 2022-10-14 ENCOUNTER — Ambulatory Visit (INDEPENDENT_AMBULATORY_CARE_PROVIDER_SITE_OTHER): Payer: Medicare PPO | Admitting: *Deleted

## 2022-10-14 DIAGNOSIS — Z23 Encounter for immunization: Secondary | ICD-10-CM

## 2022-10-17 ENCOUNTER — Other Ambulatory Visit: Payer: Self-pay | Admitting: Internal Medicine

## 2022-10-30 ENCOUNTER — Telehealth: Payer: Self-pay

## 2022-10-30 DIAGNOSIS — E782 Mixed hyperlipidemia: Secondary | ICD-10-CM

## 2022-10-30 MED ORDER — ATORVASTATIN CALCIUM 80 MG PO TABS: 80.0000 mg | ORAL_TABLET | Freq: Every day | ORAL | 3 refills | Status: DC

## 2022-10-30 NOTE — Telephone Encounter (Signed)
The patient has been notified of the result and verbalized understanding.  All questions (if any) were answered. Precious Gilding, RN 10/30/2022 2:41 PM   Advised pt to call in or send in a my chart message if develops muscle aches.   Pt will come in for FLP, ALT on 01/26/22.  Appointment reminder sent out per pt request.

## 2022-10-30 NOTE — Telephone Encounter (Signed)
-----   Message from Werner Lean, MD sent at 10/14/2022  7:54 PM EST ----- Results: Improved LDL but not bellow goal Plan: Atorvastatin 40 mg and labs in three months  Werner Lean, MD

## 2022-11-10 ENCOUNTER — Telehealth: Payer: Self-pay | Admitting: Internal Medicine

## 2022-11-10 MED ORDER — ATORVASTATIN CALCIUM 40 MG PO TABS: 40.0000 mg | ORAL_TABLET | Freq: Every day | ORAL | 3 refills | Status: DC

## 2022-11-10 NOTE — Telephone Encounter (Signed)
Pt c/o medication issue:  1. Name of Medication:   atorvastatin (LIPITOR) 80 MG tablet    2. How are you currently taking this medication (dosage and times per day)?   Take 1 tablet (80 mg total) by mouth daily.    3. Are you having a reaction (difficulty breathing--STAT)? No  4. What is your medication issue? Pt states that she has been having muscle pains in her hands and right knee since taking medication. Please advise

## 2022-11-10 NOTE — Telephone Encounter (Signed)
Called pt in regards to muscle aches associated with increased atorvastatin to 80 mg PO QD.  Advised pt to stop taking medication for 1 week then start back taking atorvastatin 40 mg PO QD.   Removed 80 mg tablet from med list updated with 40 mg tablets.   Pt last LDL 74 on 10/13/22. Will send to MD for further advisement.

## 2022-11-18 NOTE — Telephone Encounter (Signed)
Left a message to call back.

## 2022-11-20 ENCOUNTER — Other Ambulatory Visit: Payer: Self-pay | Admitting: Internal Medicine

## 2022-12-03 NOTE — Progress Notes (Unsigned)
Jamie [name] D.[place] Phone: [phone]   Assessment and Plan:     There are no diagnoses linked to this encounter.  ***   Pertinent previous records reviewed include ***   Follow Up: ***     Subjective:   I, Jamie [name], am serving as a Education administrator for Doctor Jamie [name]  Chief Complaint: wrist, hand, and knee pain   HPI:   12/04/2022 Patient is  a 89 year old female complaining of wrist, hand, and knee pain. Patient states   Relevant Historical Information: ***  Additional pertinent review of systems negative.   Current Outpatient Medications:    acetaminophen (TYLENOL) 500 MG tablet, Take 500-1,000 mg by mouth every 6 (six) hours as needed (for pain.)., Disp: , Rfl:    albuterol (VENTOLIN HFA) 108 (90 Base) MCG/ACT inhaler, INHALE 2 PUFFS BY MOUTH EVERY 4 HOURS AS NEEDED FOR WHEEZE OR FOR SHORTNESS OF BREATH, Disp: 25.5 each, Rfl: 2   ALPRAZolam (XANAX) 0.5 MG tablet, TAKE 1 TABLET BY MOUTH EVERY DAY AS NEEDED FOR ANXIETY, Disp: 20 tablet, Rfl: 0   alum & mag hydroxide-simeth (MAALOX/MYLANTA) 200-200-20 MG/5ML suspension, Take 15 mLs by mouth every 6 (six) hours as needed for indigestion or heartburn., Disp: , Rfl:    amLODipine (NORVASC) 5 MG tablet, TAKE 1 TABLET BY MOUTH EVERY DAY, Disp: 90 tablet, Rfl: 1   atorvastatin (LIPITOR) 40 MG tablet, Take 1 tablet (40 mg total) by mouth daily., Disp: 90 tablet, Rfl: 3   benzonatate (TESSALON) 200 MG capsule, TAKE 1 CAPSULE (200 MG TOTAL) BY MOUTH 3 (THREE) TIMES DAILY AS NEEDED FOR COUGH., Disp: 60 capsule, Rfl: 0   betamethasone dipropionate 0.05 % cream, APPLY TO AFFECTED AREA TWICE A DAY, Disp: 45 g, Rfl: 0   BREO ELLIPTA 100-25 MCG/ACT AEPB, INHALE 1 PUFF BY MOUTH EVERY DAY, Disp: 60 each, Rfl: 3   doxycycline (VIBRA-TABS) 100 MG tablet, Take 1 tablet (100 mg total) by mouth 2 (two) times daily., Disp: 14 tablet, Rfl: 0   famotidine  (PEPCID) 20 MG tablet, TAKE 1 TABLET BY MOUTH TWICE A DAY, Disp: 180 tablet, Rfl: 1   furosemide (LASIX) 40 MG tablet, TAKE 1 TABLET BY MOUTH EVERY DAY, Disp: 90 tablet, Rfl: 1   montelukast (SINGULAIR) 10 MG tablet, TAKE 1 TABLET BY MOUTH EVERYDAY AT BEDTIME, Disp: 90 tablet, Rfl: 1   nystatin cream (MYCOSTATIN), Apply 1 Application topically 2 (two) times daily. (Patient taking differently: Apply 1 Application topically as needed for dry skin.), Disp: 100 g, Rfl: 0   olmesartan (BENICAR) 40 MG tablet, TAKE 1 TABLET BY MOUTH EVERY DAY, Disp: 90 tablet, Rfl: 3   ondansetron (ZOFRAN-ODT) 4 MG disintegrating tablet, TAKE 1 TABLET BY MOUTH EVERY 8 HOURS AS NEEDED FOR NAUSEA AND VOMITING, Disp: 60 tablet, Rfl: 0   potassium chloride (KLOR-CON) 10 MEQ tablet, TAKE 1 TABLET BY MOUTH EVERY DAY, Disp: 90 tablet, Rfl: 1   predniSONE (DELTASONE) 20 MG tablet, Take 2 tablets (40 mg total) by mouth daily with breakfast., Disp: 10 tablet, Rfl: 0   sennosides-docusate sodium (SENOKOT-S) 8.6-50 MG tablet, Take 1 tablet by mouth daily as needed for constipation., Disp: , Rfl:    triamcinolone cream (KENALOG) 0.1 %, APPLY TO AFFECTED AREA TOPICALLY TWICE A DAY AS NEEDED FOR SKIN IRRITATION/RASH, Disp: 90 g, Rfl: 0  Current Facility-Administered Medications:    albuterol (PROVENTIL) (2.5 MG/3ML) 0.083% nebulizer solution 2.5 mg, 2.5  mg, Nebulization, Once, Hoyt Koch, MD   Objective:     There were no vitals filed for this visit.    There is no height or weight on file to calculate BMI.    Physical Exam:    ***   Electronically signed by:  Jamie [name] D.[facility] 12:21 PM 12/03/22

## 2022-12-04 ENCOUNTER — Ambulatory Visit (INDEPENDENT_AMBULATORY_CARE_PROVIDER_SITE_OTHER): Payer: Medicare PPO

## 2022-12-04 ENCOUNTER — Other Ambulatory Visit: Payer: Self-pay | Admitting: Internal Medicine

## 2022-12-04 ENCOUNTER — Ambulatory Visit: Payer: Medicare PPO | Admitting: Sports Medicine

## 2022-12-04 VITALS — BP 132/72 | HR 104 | Ht 63.0 in | Wt 173.0 lb

## 2022-12-04 DIAGNOSIS — M25531 Pain in right wrist: Secondary | ICD-10-CM

## 2022-12-04 DIAGNOSIS — M79641 Pain in right hand: Secondary | ICD-10-CM

## 2022-12-04 DIAGNOSIS — M25512 Pain in left shoulder: Secondary | ICD-10-CM | POA: Diagnosis not present

## 2022-12-04 DIAGNOSIS — M25461 Effusion, right knee: Secondary | ICD-10-CM | POA: Diagnosis not present

## 2022-12-04 DIAGNOSIS — M79642 Pain in left hand: Secondary | ICD-10-CM

## 2022-12-04 DIAGNOSIS — M19041 Primary osteoarthritis, right hand: Secondary | ICD-10-CM | POA: Diagnosis not present

## 2022-12-04 DIAGNOSIS — M25562 Pain in left knee: Secondary | ICD-10-CM

## 2022-12-04 DIAGNOSIS — M19032 Primary osteoarthritis, left wrist: Secondary | ICD-10-CM | POA: Diagnosis not present

## 2022-12-04 DIAGNOSIS — F419 Anxiety disorder, unspecified: Secondary | ICD-10-CM

## 2022-12-04 DIAGNOSIS — M25561 Pain in right knee: Secondary | ICD-10-CM | POA: Diagnosis not present

## 2022-12-04 DIAGNOSIS — G8929 Other chronic pain: Secondary | ICD-10-CM

## 2022-12-04 DIAGNOSIS — M1712 Unilateral primary osteoarthritis, left knee: Secondary | ICD-10-CM | POA: Diagnosis not present

## 2022-12-04 DIAGNOSIS — M25532 Pain in left wrist: Secondary | ICD-10-CM | POA: Diagnosis not present

## 2022-12-04 DIAGNOSIS — M19031 Primary osteoarthritis, right wrist: Secondary | ICD-10-CM | POA: Diagnosis not present

## 2022-12-04 DIAGNOSIS — M19042 Primary osteoarthritis, left hand: Secondary | ICD-10-CM | POA: Diagnosis not present

## 2022-12-04 DIAGNOSIS — M1711 Unilateral primary osteoarthritis, right knee: Secondary | ICD-10-CM | POA: Diagnosis not present

## 2022-12-04 NOTE — Patient Instructions (Signed)
Tylenol [phone] mg 2-3 times a day for pain relief  Warm hand baths  Shoulder HEP  Pt referral  4 week follow up

## 2022-12-05 MED ORDER — EZETIMIBE 10 MG PO TABS: 10.0000 mg | ORAL_TABLET | Freq: Every day | ORAL | 3 refills | Status: DC

## 2022-12-05 NOTE — Telephone Encounter (Signed)
Called pt reports is taking atorvastatin 40 mg PO QD.  Will start zetia 10 mg PO QD and come in for f/u labs on 03/09/23.

## 2022-12-17 ENCOUNTER — Ambulatory Visit: Payer: Medicare PPO | Attending: Sports Medicine

## 2022-12-17 ENCOUNTER — Other Ambulatory Visit: Payer: Self-pay

## 2022-12-17 DIAGNOSIS — M25562 Pain in left knee: Secondary | ICD-10-CM | POA: Diagnosis not present

## 2022-12-17 DIAGNOSIS — G8929 Other chronic pain: Secondary | ICD-10-CM | POA: Insufficient documentation

## 2022-12-17 DIAGNOSIS — M25512 Pain in left shoulder: Secondary | ICD-10-CM | POA: Diagnosis not present

## 2022-12-17 DIAGNOSIS — M25511 Pain in right shoulder: Secondary | ICD-10-CM | POA: Diagnosis not present

## 2022-12-17 DIAGNOSIS — M79642 Pain in left hand: Secondary | ICD-10-CM | POA: Diagnosis not present

## 2022-12-17 DIAGNOSIS — M25531 Pain in right wrist: Secondary | ICD-10-CM | POA: Insufficient documentation

## 2022-12-17 DIAGNOSIS — R262 Difficulty in walking, not elsewhere classified: Secondary | ICD-10-CM | POA: Insufficient documentation

## 2022-12-17 DIAGNOSIS — M25532 Pain in left wrist: Secondary | ICD-10-CM | POA: Diagnosis not present

## 2022-12-17 DIAGNOSIS — M6281 Muscle weakness (generalized): Secondary | ICD-10-CM | POA: Insufficient documentation

## 2022-12-17 DIAGNOSIS — M25561 Pain in right knee: Secondary | ICD-10-CM | POA: Insufficient documentation

## 2022-12-17 DIAGNOSIS — M79641 Pain in right hand: Secondary | ICD-10-CM | POA: Diagnosis not present

## 2022-12-17 NOTE — Therapy (Signed)
OUTPATIENT PHYSICAL THERAPY SHOULDER EVALUATION   Patient Name: Jamie [name] MRN: [id number] DOB:04/15/1933, 89 y.o., female Today's Date: 12/17/2022  END OF SESSION:  PT End of Session - 12/17/22 0935     Visit Number 1    Number of Visits 17    Date for PT Re-Evaluation 02/18/23    Authorization Type [employer] MCR    Authorization Time Period FOTO v6, v10    Progress Note Due on Visit 10    PT Start Time 0915    PT Stop Time 0958    PT Time Calculation (min) 43 min    Activity Tolerance Patient tolerated treatment well    Behavior During Therapy WFL for tasks assessed/performed             Past Medical History:  Diagnosis Date   Adenocarcinoma of cecum ([place]) 09/28/2019   ANEMIA, IRON DEFICIENCY 09/10/2007   Anxiety    Arthritis    Asthma    Blood transfusion without reported diagnosis    Cataract 2008   bilateral cataract extraction   CATARACT EXTRACTION, HX OF 09/10/2007   DYSPEPSIA, HX OF 09/10/2007   GERD (gastroesophageal reflux disease)    HAIR LOSS 03/21/2009   HAY FEVER 09/10/2007   HEARING LOSS, SENSORINEURAL 09/10/2007   HYPERLIPIDEMIA 09/10/2007   HYPERTENSION 09/10/2007   LOW BACK PAIN 05/10/2008   Pain in joint, hand 03/01/2008   PEPTIC ULCER DISEASE 09/10/2007   POLYP, COLON 09/10/2007   POLYPECTOMY, HX OF 09/10/2007   Pulmonary embolism ([place]) 1- 2 non-occlusive 10/10/2019   SHINGLES 08/13/2009   SHOULDER PAIN, LEFT 02/12/2008   TAH/BSO, HX OF 09/10/2007   Tuberculosis    PATIENT DENIES AT PHONE INTERVIEW ON 12/12/2019   Past Surgical History:  Procedure Laterality Date   ABDOMINAL HYSTERECTOMY     BREAST SURGERY     breast biopsy-benign   CATARACT EXTRACTION     COLONOSCOPY     EYE SURGERY     bilateral cataract with lens implant   IRRIGATION AND DEBRIDEMENT SEBACEOUS CYST     LAPAROSCOPIC RIGHT HEMI COLECTOMY Right 12/15/2019   Procedure: LAPAROSCOPIC RIGHT HEMI COLECTOMY WITH LYSIS OF ADHESIONS;  Surgeon: Ileana Roup, MD;   Location: [facility];  Service: General;  Laterality: Right;   TOTAL ABDOMINAL HYSTERECTOMY W/ BILATERAL SALPINGOOPHORECTOMY     Patient Active Problem List   Diagnosis Date Noted   DOE (dyspnea on exertion) 07/10/2022   Elevated coronary artery calcium score 07/10/2022   Bilateral hearing loss 02/21/2022   Palpitations 02/13/2022   Aortic atherosclerosis ([place]) 11/29/2021   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   Acute focal neurological deficit 02/26/2021   Adenocarcinoma of cecum ([place]) 09/28/2019   Normocytic anemia 09/28/2019   Rash 06/04/2018   Mild persistent asthma 04/09/2018   Degenerative arthritis of left knee 05/26/2017   Degenerative tear of left medial meniscus 05/01/2017   Anxiety 01/08/2017   Left arm pain 11/05/2016   Arthritis of carpometacarpal (CMC) joints of both thumbs 06/22/2014   Routine health maintenance 08/09/2011   Alopecia 03/21/2009   Low back pain 05/10/2008   Mixed hyperlipidemia 09/10/2007   Essential hypertension 09/10/2007   GERD (gastroesophageal reflux disease) 09/10/2007    PCP: Hoyt Koch, MD   REFERRING PROVIDER: Glennon Mac, DO  REFERRING DIAG:  [phone] (ICD-10-CM) - Bilateral hand pain  M25.531,M25.532 (ICD-10-CM) - Bilateral wrist pain  M25.561,M25.562,G89.29 (ICD-10-CM) - Chronic pain of both knees  M25.512,G89.29 (ICD-10-CM) - Chronic left shoulder pain    THERAPY DIAG:  Chronic pain of left knee - Plan: PT plan of care cert/re-cert  Chronic pain of right knee - Plan: PT plan of care cert/re-cert  Chronic left shoulder pain - Plan: PT plan of care cert/re-cert  Muscle weakness (generalized) - Plan: PT plan of care cert/re-cert  Difficulty in walking, not elsewhere classified - Plan: PT plan of care cert/re-cert  Chronic right shoulder pain - Plan: PT plan of care cert/re-cert  Rationale for Evaluation and Treatment: Rehabilitation  ONSET DATE: one month ago (BIL shoulder) and three years ago (Rt  knee)  SUBJECTIVE:                                                                                                                                                                                      SUBJECTIVE STATEMENT: Pt reports primary c/o insidious BIL shoulder pain lasting lasting about a month and Rt intermittent knee pain lasting about three years. Pt denies any N/T or radicular pain related to these issues. Pt also has c/o BIL wrist/ hand pain, although primary concern at this time is the larger joints. Recent imaging suggests global OA in pt's shoulders and wrists/ hands. Current pain is 5/10. Worst pain is 8/10. Best pain 5/10. Aggravating factors for shoulders include sleeping on side, reaching overhead. Aggravating factors for Rt knee include standing >2 hours. Easing factors include Tylenol, warm bath. Pt denies any unexplained weight change, changes in bowel/ bladder function, nausea/ vomiting.   PERTINENT HISTORY: Hx of cancer (not current), HTN, asthma, anxiety  PAIN:  Are you having pain? Yes: NPRS scale: 5/10 Pain location: BIL shoulders, Rt knee Pain description: Achy Aggravating factors: shoulders: sleeping on side, reaching overhead. Rt knee: standing >2 hours Relieving factors: Tylenol, warm bath  PRECAUTIONS: None  WEIGHT BEARING RESTRICTIONS: No  FALLS:  Has patient fallen in last 6 months? No  LIVING ENVIRONMENT: Lives with: lives with their son and lives with their daughter Lives in: House/apartment Stairs: No Has following equipment at home: Single point cane and Environmental consultant - 2 wheeled  OCCUPATION: Retired  PLOF: [place] overhead into cabinet, cooking with less limitation   OBJECTIVE:   DIAGNOSTIC FINDINGS:  12/04/2022: DG Knee AP/LAT W/Sunrise Left: IMPRESSION: Moderate to severe lateral compartment joint space narrowing.  12/04/2022: DG Knee AP/ LAT W/SUNRISE Right: IMPRESSION: 1. Mild patellofemoral compartment  osteoarthritis. 2. Tiny joint effusion.  PATIENT SURVEYS:  FOTO 50%, projected 60% in 20 visits  COGNITION: Overall cognitive status: Within functional limits for tasks assessed     SENSATION: Not tested  POSTURE: Forward head/ shoulders  UPPER EXTREMITY ROM:   A/PROM Right eval Left eval  Shoulder flexion 160/180  130/165p!  Shoulder abduction 150/175 130/155p!  Shoulder internal rotation 100/110 100/110  Shoulder external rotation 50/55p! 45/50p!  (Blank rows = not tested)  UPPER EXTREMITY MMT:  MMT Right eval Left eval  Shoulder flexion 5/5 3+/5  Shoulder abduction 5/5 3+/5  Shoulder internal rotation 5/5 5/5  Shoulder external rotation 5/5 4/5  Middle trapezius 3/5 3/5  Lower trapezius Unable to assume position Unable to assume position  Latissimus dorsi 3/5 3/5  Elbow flexion 5/5 5/5  Elbow extension 5/5 5/5  Grip strength (lbs)    (Blank rows = not tested)   LE ROM:  A/PROM Right 12/17/2022 Left 12/17/2022  Knee flexion 115/120p! 115/120 minor p!  Knee extension 0/2 minor p! 0/2   (Blank rows = not tested)  LE MMT:  MMT Right 12/17/2022 Left 12/17/2022  Hip flexion 4/5 4/5  Hip extension 2/5 2/5  Hip abduction 3+/5 3+/5  Knee flexion 5/5 5/5  Knee extension 4/5 4+/5   (Blank rows = not tested)    SPECIAL TESTS: Hawkins-Kennedy: (-) on Lt Neer's: (+) on Lt Patellar compression: (+) on Rt Lateral patellar pull: (-) BIL Patellar apprehension: (-) BIL  FUNCTIONAL TESTS: 5xSTS: 14 seconds Squat: 75%, Rt knee pain  JOINT MOBILITY TESTING:  Patellar glides hypomobile with crepitus in all planes BIL  PALPATION:  No TTP about BIL knees   TODAY'S TREATMENT:                                                                                                                                          [facility] Adult PT Treatment:                                                DATE: 12/17/2022 Demonstrated and issued HEP    PATIENT  EDUCATION: Education details: Pt educated on potential underlying pathophysiology, POC, prognosis, FOTO, and HEP Person educated: Patient Education method: Consulting civil engineer, Demonstration, and Handouts Education comprehension: verbalized understanding and returned demonstration  HOME EXERCISE PROGRAM: Access Code: [email or web address] URL: [email or web address] Date: 12/17/2022 Prepared by: Vanessa [place]  Exercises - Mini Squat with Counter Support  - 1 x daily - 7 x weekly - 3 sets - 10 reps - Putty Squeezes  - 1 x daily - 7 x weekly - 3 sets - 20 reps - Scapular Retraction with Resistance  - 1 x daily - 7 x weekly - 3 sets - 10 reps - Shoulder Scaption AAROM with Dowel  - 1 x daily - 7 x weekly - 3 sets - 10 reps - 3 seconds hold  ASSESSMENT:  CLINICAL IMPRESSION: Patient is an 89 y.o. F who was seen today for physical therapy evaluation and treatment for subacute BIL shoulder pain and chronic BIL knee pain. Upon assessment, her primary  impairments include weak Lt knee MMT, weak BIL global hip MMT, weak Lt shoulder MMT, weak BIL parascapular MMT, and limited and painful Lt shoulder elevation AROM. Ruling up mechanical pain likely related to osteoarthritis. Cannot rule out Lt shoulder RTC pathology due to painful and limited shoulder elevation AROM and positive Neer's, although negative Hawkins-Kennedy test decreases the likelihood of severe pathology. Pt will benefit from skilled PT to address her primary impairments and return to her prior level of function with less limitation.  OBJECTIVE IMPAIRMENTS: Abnormal gait, decreased activity tolerance, decreased endurance, decreased mobility, difficulty walking, decreased ROM, decreased strength, hypomobility, increased edema, impaired flexibility, impaired UE functional use, improper body mechanics, postural dysfunction, and pain.   ACTIVITY LIMITATIONS: carrying, lifting, bending, sitting, standing, squatting, sleeping, stairs, transfers,  reach over head, and locomotion level  PARTICIPATION LIMITATIONS: meal prep, cleaning, laundry, driving, shopping, community activity, and yard work  PERSONAL FACTORS: Time since onset of injury/illness/exacerbation, 3+ comorbidities: See medical hx, and multiple treatment areas  are also affecting patient's functional outcome.   REHAB POTENTIAL: Good  CLINICAL DECISION MAKING: Evolving/moderate complexity  EVALUATION COMPLEXITY: Low   GOALS: Goals reviewed with patient? Yes  SHORT TERM GOALS: Target date: 01/14/2023  Pt will report understanding and adherence to initial HEP in order to promote independence in the management of primary impairments. Baseline: HEP provided at eval Goal status: INITIAL    LONG TERM GOALS: Target date: 02/11/2023  Pt will achieve a FOTO score of 60% in order to demonstrate improved functional ability as it relates to the pt's primary impairments. Baseline: 50% Goal status: INITIAL  2.  Pt will achieve BIL global LE strength of 4+/5 or greater in order to progress her independent strengthening regimen with less limitation.  Baseline: See MMT chart Goal status: INITIAL  3.  Pt will report ability to sleep on her side without pain in order to feel well rested.  Baseline: Pt woken several times per night by shoulder pain Goal status: INITIAL  4.  Pt will achieve Lt shoulder flexion AROM of 160 degrees in order to reach into overhead cabinets with less limitation. Baseline: 130 Goal status: INITIAL  5.  Pt will achieve BIL global parascapular strength of 4/5 or greater in order to promote improved functional strength in the prophylaxis of future shoulder pain. Baseline: See MMT chart Goal status: INITIAL   PLAN:  PT FREQUENCY: 2x/week  PT DURATION: 8 weeks  PLANNED INTERVENTIONS: Therapeutic exercises, Therapeutic activity, Neuromuscular re-education, Balance training, Gait training, Patient/Family education, Self Care, Joint mobilization,  Stair training, Vestibular training, Orthotic/Fit training, Aquatic Therapy, Dry Needling, Electrical stimulation, Spinal mobilization, Cryotherapy, Moist heat, Taping, Vasopneumatic device, Ionotophoresis '4mg'$ /ml Dexamethasone, Manual therapy, and Re-evaluation  PLAN FOR NEXT SESSION: Progress early shoulder/ knee mobility/ strengthening   Referring diagnosis?  [phone] (ICD-10-CM) - Bilateral hand pain  M25.531,M25.532 (ICD-10-CM) - Bilateral wrist pain  M25.561,M25.562,G89.29 (ICD-10-CM) - Chronic pain of both knees  M25.512,G89.29 (ICD-10-CM) - Chronic left shoulder pain   Treatment diagnosis? (if different than referring diagnosis)   Chronic pain of left knee  Chronic pain of right knee  Chronic left shoulder pain  Muscle weakness (generalized)  Difficulty in walking, not elsewhere classified  Chronic right shoulder pain What was this (referring dx) caused by? '[]'$  Surgery '[]'$  Fall '[x]'$  Ongoing issue '[x]'$  Arthritis '[]'$  Other: ____________  Laterality: '[]'$  Rt '[]'$  Lt '[x]'$  Both  Check all possible CPT codes:  *CHOOSE 10 OR LESS*    '[x]'$  97110 (Therapeutic Exercise)  '[]'$  92507 (  SLP Treatment)  '[x]'$  [id number] (Neuro Re-ed)   '[]'$  92526 (Swallowing Treatment)   '[x]'$  97116 (Gait Training)   '[]'$  [phone] (Cognitive Training, 1st 15 minutes) '[x]'$  97140 (Manual Therapy)   '[]'$  97130 (Cognitive Training, each add'l 15 minutes)  '[x]'$  97164 (Re-evaluation)                              '[]'$  Other, List CPT Code ____________  '[x]'$  37048 (Therapeutic Activities)     '[x]'$  88916 (Self Care)   '[x]'$  All codes above (97110 - 97535)  '[]'$  97012 (Mechanical Traction)  '[x]'$  97014 (E-stim Unattended)  '[x]'$  97032 (E-stim manual)  '[]'$  97033 (Ionto)  '[]'$  97035 (Ultrasound) '[]'$  97750 (Physical Performance Training) '[x]'$  [id number] (Aquatic Therapy) '[x]'$  97016 (Vasopneumatic Device) '[]'$  [id number] (Paraffin) '[]'$  97034 (Contrast Bath) '[]'$  97597 (Wound Care 1st 20 sq cm) '[]'$  97598 (Wound Care each add'l 20 sq cm) '[]'$  97760 (Orthotic  Fabrication, Fitting, Training Initial) '[]'$  [id number] (Prosthetic Management and Training Initial) '[]'$  [id number] (Orthotic or Prosthetic Training/ Modification Subsequent)   Vanessa [place], PT, DPT 12/17/22 11:05 AM

## 2022-12-23 ENCOUNTER — Ambulatory Visit: Payer: Medicare PPO

## 2022-12-25 ENCOUNTER — Telehealth: Payer: Self-pay

## 2022-12-25 ENCOUNTER — Ambulatory Visit: Payer: Medicare PPO

## 2022-12-25 NOTE — Therapy (Incomplete)
OUTPATIENT PHYSICAL THERAPY TREATMENT NOTE   Patient Name: Jamie [name] MRN: [id number] DOB:10-04-1933, 89 y.o., female Today's Date: 12/25/2022  PCP: Hoyt Koch, MD  REFERRING PROVIDER: Glennon Mac, DO   END OF SESSION:    Past Medical History:  Diagnosis Date   Adenocarcinoma of cecum ([place]) 09/28/2019   ANEMIA, IRON DEFICIENCY 09/10/2007   Anxiety    Arthritis    Asthma    Blood transfusion without reported diagnosis    Cataract 2008   bilateral cataract extraction   CATARACT EXTRACTION, HX OF 09/10/2007   DYSPEPSIA, HX OF 09/10/2007   GERD (gastroesophageal reflux disease)    HAIR LOSS 03/21/2009   HAY FEVER 09/10/2007   HEARING LOSS, SENSORINEURAL 09/10/2007   HYPERLIPIDEMIA 09/10/2007   HYPERTENSION 09/10/2007   LOW BACK PAIN 05/10/2008   Pain in joint, hand 03/01/2008   PEPTIC ULCER DISEASE 09/10/2007   POLYP, COLON 09/10/2007   POLYPECTOMY, HX OF 09/10/2007   Pulmonary embolism ([place]) 1- 2 non-occlusive 10/10/2019   SHINGLES 08/13/2009   SHOULDER PAIN, LEFT 02/12/2008   TAH/BSO, HX OF 09/10/2007   Tuberculosis    PATIENT DENIES AT PHONE INTERVIEW ON 12/12/2019   Past Surgical History:  Procedure Laterality Date   ABDOMINAL HYSTERECTOMY     BREAST SURGERY     breast biopsy-benign   CATARACT EXTRACTION     COLONOSCOPY     EYE SURGERY     bilateral cataract with lens implant   IRRIGATION AND DEBRIDEMENT SEBACEOUS CYST     LAPAROSCOPIC RIGHT HEMI COLECTOMY Right 12/15/2019   Procedure: LAPAROSCOPIC RIGHT HEMI COLECTOMY WITH LYSIS OF ADHESIONS;  Surgeon: Ileana Roup, MD;  Location: [facility];  Service: General;  Laterality: Right;   TOTAL ABDOMINAL HYSTERECTOMY W/ BILATERAL SALPINGOOPHORECTOMY     Patient Active Problem List   Diagnosis Date Noted   DOE (dyspnea on exertion) 07/10/2022   Elevated coronary artery calcium score 07/10/2022   Bilateral hearing loss 02/21/2022   Palpitations 02/13/2022   Aortic atherosclerosis ([place])  11/29/2021   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   Acute focal neurological deficit 02/26/2021   Adenocarcinoma of cecum ([place]) 09/28/2019   Normocytic anemia 09/28/2019   Rash 06/04/2018   Mild persistent asthma 04/09/2018   Degenerative arthritis of left knee 05/26/2017   Degenerative tear of left medial meniscus 05/01/2017   Anxiety 01/08/2017   Left arm pain 11/05/2016   Arthritis of carpometacarpal (CMC) joints of both thumbs 06/22/2014   Routine health maintenance 08/09/2011   Alopecia 03/21/2009   Low back pain 05/10/2008   Mixed hyperlipidemia 09/10/2007   Essential hypertension 09/10/2007   GERD (gastroesophageal reflux disease) 09/10/2007    REFERRING DIAG:  G64.403,K74.259 (ICD-10-CM) - Bilateral hand pain  M25.531,M25.532 (ICD-10-CM) - Bilateral wrist pain  M25.561,M25.562,G89.29 (ICD-10-CM) - Chronic pain of both knees  M25.512,G89.29 (ICD-10-CM) - Chronic left shoulder pain    THERAPY DIAG:  No diagnosis found.  Rationale for Evaluation and Treatment Rehabilitation  PERTINENT HISTORY: Hx of cancer (not current), HTN, asthma, anxiety   PRECAUTIONS: None  ONSET DATE: one month ago (BIL shoulder) and three years ago (Rt knee)   SUBJECTIVE:  SUBJECTIVE STATEMENT:  ***   PAIN:  Are you having pain? Yes: NPRS scale: ***5/10 Pain location: BIL shoulders, Rt knee Pain description: Achy Aggravating factors: shoulders: sleeping on side, reaching overhead. Rt knee: standing >2 hours Relieving factors: Tylenol, warm bath   OBJECTIVE: (objective measures completed at initial evaluation unless otherwise dated)   DIAGNOSTIC FINDINGS:  12/04/2022: DG Knee AP/LAT W/Sunrise Left: IMPRESSION: Moderate to severe lateral compartment joint space narrowing.   12/04/2022: DG Knee AP/ LAT W/SUNRISE Right:  IMPRESSION: 1. Mild patellofemoral compartment osteoarthritis. 2. Tiny joint effusion.   PATIENT SURVEYS:  FOTO 50%, projected 60% in 20 visits   COGNITION: Overall cognitive status: Within functional limits for tasks assessed                                  SENSATION: Not tested   POSTURE: Forward head/ shoulders   UPPER EXTREMITY ROM:    A/PROM Right eval Left eval  Shoulder flexion 160/180 130/165p!  Shoulder abduction 150/175 130/155p!  Shoulder internal rotation 100/110 100/110  Shoulder external rotation 50/55p! 45/50p!  (Blank rows = not tested)   UPPER EXTREMITY MMT:   MMT Right eval Left eval  Shoulder flexion 5/5 3+/5  Shoulder abduction 5/5 3+/5  Shoulder internal rotation 5/5 5/5  Shoulder external rotation 5/5 4/5  Middle trapezius 3/5 3/5  Lower trapezius Unable to assume position Unable to assume position  Latissimus dorsi 3/5 3/5  Elbow flexion 5/5 5/5  Elbow extension 5/5 5/5  Grip strength (lbs)      (Blank rows = not tested)     LE ROM:   A/PROM Right 12/17/2022 Left 12/17/2022  Knee flexion 115/120p! 115/120 minor p!  Knee extension 0/2 minor p! 0/2   (Blank rows = not tested)   LE MMT:   MMT Right 12/17/2022 Left 12/17/2022  Hip flexion 4/5 4/5  Hip extension 2/5 2/5  Hip abduction 3+/5 3+/5  Knee flexion 5/5 5/5  Knee extension 4/5 4+/5   (Blank rows = not tested)       SPECIAL TESTS: Hawkins-Kennedy: (-) on Lt Neer's: (+) on Lt Patellar compression: (+) on Rt Lateral patellar pull: (-) BIL Patellar apprehension: (-) BIL   FUNCTIONAL TESTS: 5xSTS: 14 seconds Squat: 75%, Rt knee pain   JOINT MOBILITY TESTING:  Patellar glides hypomobile with crepitus in all planes BIL   PALPATION:  No TTP about BIL knees             TODAY'S TREATMENT: [facility] Adult PT Treatment:                                                DATE: 12/25/2022 Therapeutic Exercise: Nustep level 5 x 5 mins Mini squats with UE support LAQ Seated  double ER with scapular retraction Seated rows Seated shoulder extension Shoulder scaption AAROM with dowel Seated/supine shoulder flexion wit dowel Supine chest press with dowel Supine/seated hip adduction ball squeeze Seated marching Seated/supine clamshells Seated heel raises    [facility] Adult PT Treatment:                                                DATE: 12/17/2022  Demonstrated and issued HEP       PATIENT EDUCATION: Education details: Pt educated on potential underlying pathophysiology, POC, prognosis, FOTO, and HEP Person educated: Patient Education method: Explanation, Demonstration, and Handouts Education comprehension: verbalized understanding and returned demonstration   HOME EXERCISE PROGRAM: Access Code: [email or web address] URL: [email or web address] Date: 12/17/2022 Prepared by: Vanessa [place]   Exercises - Mini Squat with Counter Support  - 1 x daily - 7 x weekly - 3 sets - 10 reps - Putty Squeezes  - 1 x daily - 7 x weekly - 3 sets - 20 reps - Scapular Retraction with Resistance  - 1 x daily - 7 x weekly - 3 sets - 10 reps - Shoulder Scaption AAROM with Dowel  - 1 x daily - 7 x weekly - 3 sets - 10 reps - 3 seconds hold   ASSESSMENT:   CLINICAL IMPRESSION: ***  Patient is an 89 y.o. F who was seen today for physical therapy evaluation and treatment for subacute BIL shoulder pain and chronic BIL knee pain. Upon assessment, her primary impairments include weak Lt knee MMT, weak BIL global hip MMT, weak Lt shoulder MMT, weak BIL parascapular MMT, and limited and painful Lt shoulder elevation AROM. Ruling up mechanical pain likely related to osteoarthritis. Cannot rule out Lt shoulder RTC pathology due to painful and limited shoulder elevation AROM and positive Neer's, although negative Hawkins-Kennedy test decreases the likelihood of severe pathology. Pt will benefit from skilled PT to address her primary impairments and return to her prior level of function  with less limitation.   OBJECTIVE IMPAIRMENTS: Abnormal gait, decreased activity tolerance, decreased endurance, decreased mobility, difficulty walking, decreased ROM, decreased strength, hypomobility, increased edema, impaired flexibility, impaired UE functional use, improper body mechanics, postural dysfunction, and pain.    ACTIVITY LIMITATIONS: carrying, lifting, bending, sitting, standing, squatting, sleeping, stairs, transfers, reach over head, and locomotion level   PARTICIPATION LIMITATIONS: meal prep, cleaning, laundry, driving, shopping, community activity, and yard work   PERSONAL FACTORS: Time since onset of injury/illness/exacerbation, 3+ comorbidities: See medical hx, and multiple treatment areas  are also affecting patient's functional outcome.    REHAB POTENTIAL: Good   CLINICAL DECISION MAKING: Evolving/moderate complexity   EVALUATION COMPLEXITY: Low     GOALS: Goals reviewed with patient? Yes   SHORT TERM GOALS: Target date: 01/14/2023   Pt will report understanding and adherence to initial HEP in order to promote independence in the management of primary impairments. Baseline: HEP provided at eval Goal status: INITIAL       LONG TERM GOALS: Target date: 02/11/2023   Pt will achieve a FOTO score of 60% in order to demonstrate improved functional ability as it relates to the pt's primary impairments. Baseline: 50% Goal status: INITIAL   2.  Pt will achieve BIL global LE strength of 4+/5 or greater in order to progress her independent strengthening regimen with less limitation.  Baseline: See MMT chart Goal status: INITIAL   3.  Pt will report ability to sleep on her side without pain in order to feel well rested.  Baseline: Pt woken several times per night by shoulder pain Goal status: INITIAL   4.  Pt will achieve Lt shoulder flexion AROM of 160 degrees in order to reach into overhead cabinets with less limitation. Baseline: 130 Goal status: INITIAL   5.   Pt will achieve BIL global parascapular strength of 4/5 or greater in order to promote improved functional strength in the prophylaxis of future shoulder pain.  Baseline: See MMT chart Goal status: INITIAL     PLAN:   PT FREQUENCY: 2x/week   PT DURATION: 8 weeks   PLANNED INTERVENTIONS: Therapeutic exercises, Therapeutic activity, Neuromuscular re-education, Balance training, Gait training, Patient/Family education, Self Care, Joint mobilization, Stair training, Vestibular training, Orthotic/Fit training, Aquatic Therapy, Dry Needling, Electrical stimulation, Spinal mobilization, Cryotherapy, Moist heat, Taping, Vasopneumatic device, Ionotophoresis '4mg'$ /ml Dexamethasone, Manual therapy, and Re-evaluation   PLAN FOR NEXT SESSION: Progress early shoulder/ knee mobility/ strengthening   Margarette [place], PTA 12/25/2022, 8:50 AM

## 2022-12-25 NOTE — Telephone Encounter (Signed)
LVM regarding missed appointment. Confirmed next appointment time and clinic attendance policy.  Margarette Canada, PTA 12/25/22 12:46 PM

## 2022-12-30 ENCOUNTER — Ambulatory Visit: Payer: Medicare PPO | Admitting: Family Medicine

## 2022-12-30 ENCOUNTER — Telehealth: Payer: Self-pay

## 2022-12-30 ENCOUNTER — Ambulatory Visit: Payer: Medicare PPO

## 2022-12-30 ENCOUNTER — Encounter: Payer: Self-pay | Admitting: Family Medicine

## 2022-12-30 VITALS — BP 142/64 | HR 82 | Temp 97.8°F | Ht 63.0 in

## 2022-12-30 DIAGNOSIS — R21 Rash and other nonspecific skin eruption: Secondary | ICD-10-CM | POA: Diagnosis not present

## 2022-12-30 DIAGNOSIS — L259 Unspecified contact dermatitis, unspecified cause: Secondary | ICD-10-CM | POA: Diagnosis not present

## 2022-12-30 NOTE — Progress Notes (Signed)
Subjective:     Patient ID: Jamie [name], female    DOB: 1933-05-17, 89 y.o.   MRN: [id number]  Chief Complaint  Patient presents with   Rash    Rash on chest on and off since March/April. Started back last week and is very itchy. Denies any new detergents.    HPI Patient is in today for a one wk hx of pruritic rash on anterior chest. This has been recurrent. No known trigger.  No new soaps, lotions, detergents, etc.  No new medications.   Denies fever, chills, N/V/D.  No other rash.   Health Maintenance Due  Topic Date Due   Zoster Vaccines- Shingrix (1 of 2) Never done   DTaP/Tdap/Td (2 - Tdap) 05/17/2020   COLONOSCOPY (Pts 45-71yr Insurance coverage will need to be confirmed)  03/18/2022   COVID-19 Vaccine (5 - 2023-24 season) 08/01/2022    Past Medical History:  Diagnosis Date   Adenocarcinoma of cecum ([facility] 09/28/2019   ANEMIA, IRON DEFICIENCY 09/10/2007   Anxiety    Arthritis    Asthma    Blood transfusion without reported diagnosis    Cataract 2008   bilateral cataract extraction   CATARACT EXTRACTION, HX OF 09/10/2007   DYSPEPSIA, HX OF 09/10/2007   GERD (gastroesophageal reflux disease)    HAIR LOSS 03/21/2009   HAY FEVER 09/10/2007   HEARING LOSS, SENSORINEURAL 09/10/2007   HYPERLIPIDEMIA 09/10/2007   HYPERTENSION 09/10/2007   LOW BACK PAIN 05/10/2008   Pain in joint, hand 03/01/2008   PEPTIC ULCER DISEASE 09/10/2007   POLYP, COLON 09/10/2007   POLYPECTOMY, HX OF 09/10/2007   Pulmonary embolism ([facility] 1- 2 non-occlusive 10/10/2019   SHINGLES 08/13/2009   SHOULDER PAIN, LEFT 02/12/2008   TAH/BSO, HX OF 09/10/2007   Tuberculosis    PATIENT DENIES AT PHONE INTERVIEW ON 12/12/2019    Past Surgical History:  Procedure Laterality Date   ABDOMINAL HYSTERECTOMY     BREAST SURGERY     breast biopsy-benign   CATARACT EXTRACTION     COLONOSCOPY     EYE SURGERY     bilateral cataract with lens implant   IRRIGATION AND DEBRIDEMENT SEBACEOUS CYST      LAPAROSCOPIC RIGHT HEMI COLECTOMY Right 12/15/2019   Procedure: LAPAROSCOPIC RIGHT HEMI COLECTOMY WITH LYSIS OF ADHESIONS;  Surgeon: WIleana Roup MD;  Location: [facility];  Service: General;  Laterality: Right;   TOTAL ABDOMINAL HYSTERECTOMY W/ BILATERAL SALPINGOOPHORECTOMY      Family History  Problem Relation Age of Onset   Hypertension Mother    Coronary artery disease Mother    Heart disease Mother        CHF   Cancer Son 688      prostate cancer    Cancer Son 638      prostate cancer   Diabetes Neg Hx     Social History   Socioeconomic History   Marital status: Widowed    Spouse name: Not on file   Number of children: 4   Years of education: Not on file   Highest education level: Not on file  Occupational History   Occupation: retired  Tobacco Use   Smoking status: Former    Packs/day: 0.20    Years: 25.00    Total pack years: 5.00    Types: Cigarettes    Quit date: 12/01/1989    Years since quitting: 33.1   Smokeless tobacco: Never   Tobacco comments:    smoked a long time ago;  Quit 90 or 91/ number is approximate  Vaping Use   Vaping Use: Never used  Substance and Sexual Activity   Alcohol use: No    Alcohol/week: 0.0 standard drinks of alcohol   Drug use: No   Sexual activity: Not Currently  Other Topics Concern   Not on file  Social History Narrative   3 sons, 33,59, 42 and 1 daughter 60, 4 grandchildren   SO with multiple medical problems   Social Determinants of Health   Financial Resource Strain: Low Risk  (03/11/2022)   Overall Financial Resource Strain (CARDIA)    Difficulty of Paying Living Expenses: Not hard at all  Food Insecurity: No Food Insecurity (03/11/2022)   Hunger Vital Sign    Worried About Running Out of Food in the Last Year: Never true    Ran Out of Food in the Last Year: Never true  Transportation Needs: No Transportation Needs (03/11/2022)   PRAPARE - Hydrologist (Medical): No    Lack of  Transportation (Non-Medical): No  Physical Activity: Inactive (03/11/2022)   Exercise Vital Sign    Days of Exercise per Week: 0 days    Minutes of Exercise per Session: 0 min  Stress: No Stress Concern Present (03/11/2022)   [place]    Feeling of Stress : Not at all  Social Connections: Moderately Isolated (03/11/2022)   Social Connection and Isolation Panel [NHANES]    Frequency of Communication with Friends and Family: More than three times a week    Frequency of Social Gatherings with Friends and Family: More than three times a week    Attends Religious Services: More than 4 times per year    Active Member of [employer] or Organizations: No    Attends Archivist Meetings: Never    Marital Status: Widowed  Intimate Partner Violence: Not At Risk (03/11/2022)   Humiliation, Afraid, Rape, and Kick questionnaire    Fear of Current or Ex-Partner: No    Emotionally Abused: No    Physically Abused: No    Sexually Abused: No    Outpatient Medications Prior to Visit  Medication Sig Dispense Refill   acetaminophen (TYLENOL) 500 MG tablet Take 500-1,000 mg by mouth every 6 (six) hours as needed (for pain.).     albuterol (VENTOLIN HFA) 108 (90 Base) MCG/ACT inhaler INHALE 2 PUFFS BY MOUTH EVERY 4 HOURS AS NEEDED FOR WHEEZE OR FOR SHORTNESS OF BREATH 25.5 each 2   ALPRAZolam (XANAX) 0.5 MG tablet TAKE 1 TABLET BY MOUTH EVERY DAY AS NEEDED FOR ANXIETY 20 tablet 2   alum & mag hydroxide-simeth (MAALOX/MYLANTA) 200-200-20 MG/5ML suspension Take 15 mLs by mouth every 6 (six) hours as needed for indigestion or heartburn.     amLODipine (NORVASC) 5 MG tablet TAKE 1 TABLET BY MOUTH EVERY DAY 90 tablet 1   atorvastatin (LIPITOR) 40 MG tablet Take 1 tablet (40 mg total) by mouth daily. 90 tablet 3   benzonatate (TESSALON) 200 MG capsule TAKE 1 CAPSULE (200 MG TOTAL) BY MOUTH 3 (THREE) TIMES DAILY AS NEEDED FOR COUGH. 60 capsule 0    betamethasone dipropionate 0.05 % cream APPLY TO AFFECTED AREA TWICE A DAY 45 g 0   BREO ELLIPTA 100-25 MCG/ACT AEPB INHALE 1 PUFF BY MOUTH EVERY DAY 60 each 3   doxycycline (VIBRA-TABS) 100 MG tablet Take 1 tablet (100 mg total) by mouth 2 (two) times daily. 14 tablet 0  ezetimibe (ZETIA) 10 MG tablet Take 1 tablet (10 mg total) by mouth daily. 90 tablet 3   famotidine (PEPCID) 20 MG tablet TAKE 1 TABLET BY MOUTH TWICE A DAY 180 tablet 1   furosemide (LASIX) 40 MG tablet TAKE 1 TABLET BY MOUTH EVERY DAY 90 tablet 1   montelukast (SINGULAIR) 10 MG tablet TAKE 1 TABLET BY MOUTH EVERYDAY AT BEDTIME 90 tablet 1   nystatin cream (MYCOSTATIN) Apply 1 Application topically 2 (two) times daily. (Patient taking differently: Apply 1 Application topically as needed for dry skin.) 100 g 0   olmesartan (BENICAR) 40 MG tablet TAKE 1 TABLET BY MOUTH EVERY DAY 90 tablet 3   ondansetron (ZOFRAN-ODT) 4 MG disintegrating tablet TAKE 1 TABLET BY MOUTH EVERY 8 HOURS AS NEEDED FOR NAUSEA AND VOMITING 60 tablet 0   potassium chloride (KLOR-CON) 10 MEQ tablet TAKE 1 TABLET BY MOUTH EVERY DAY 90 tablet 1   sennosides-docusate sodium (SENOKOT-S) 8.6-50 MG tablet Take 1 tablet by mouth daily as needed for constipation.     triamcinolone cream (KENALOG) 0.1 % APPLY TO AFFECTED AREA TOPICALLY TWICE A DAY AS NEEDED FOR SKIN IRRITATION/RASH 90 g 0   Facility-Administered Medications Prior to Visit  Medication Dose Route Frequency Provider Last Rate Last Admin   albuterol (PROVENTIL) (2.5 MG/3ML) 0.083% nebulizer solution 2.5 mg  2.5 mg Nebulization Once Hoyt Koch, MD        Allergies  Allergen Reactions   Aspirin Other (See Comments)    Stomach upset   Latex Itching and Rash    ROS     Objective:    Physical Exam Constitutional:      General: She is not in acute distress.    Appearance: She is not ill-appearing.  HENT:     Mouth/Throat:     Mouth: Mucous membranes are moist.  Eyes:      Conjunctiva/sclera: Conjunctivae normal.  Cardiovascular:     Rate and Rhythm: Normal rate.  Pulmonary:     Effort: Pulmonary effort is normal.  Chest:       Comments: Pruritic red rash on anterior chest wall. No edema, induration or fluctuance. Non tender.  Musculoskeletal:     Cervical back: Normal range of motion and neck supple.  Skin:    General: Skin is warm and dry.  Neurological:     Mental Status: She is alert.  Psychiatric:        Mood and Affect: Mood normal.        Behavior: Behavior normal.     BP (!) 142/64 (BP Location: Left Arm, Patient Position: Sitting, Cuff Size: Large)   Pulse 82   Temp 97.8 F (36.6 C) (Temporal)   Ht '5\' 3"'$  (1.6 m)   SpO2 98%   BMI 30.65 kg/m  Wt Readings from Last 3 Encounters:  12/04/22 173 lb (78.5 kg)  08/26/22 173 lb (78.5 kg)  07/16/22 173 lb (78.5 kg)       Assessment & Plan:   Problem List Items Addressed This Visit   None Visit Diagnoses     Contact dermatitis, unspecified contact dermatitis type, unspecified trigger    -  Primary   Rash and nonspecific skin eruption          Unknown trigger. Most likely contact dermatitis from zipper on her coat. Encouraged her to look for triggers.  Use topical triamcinolone, she has this at home, x 5-7 days and then stop. Use cool compress to help with itching. Discussed using  Benadryl but she does not want to take this.  Discussed possible steroid injection but she is not that uncomfortable today. We will hold off on steroid therapy. Follow up if worsening or not improving in the next week.   I am having Ragna L. Ceniceros maintain her alum & mag hydroxide-simeth, acetaminophen, sennosides-docusate sodium, potassium chloride, olmesartan, betamethasone dipropionate, ondansetron, nystatin cream, amLODipine, montelukast, furosemide, doxycycline, albuterol, triamcinolone cream, atorvastatin, Breo Ellipta, famotidine, benzonatate, ALPRAZolam, and ezetimibe. We will continue to  administer albuterol.  No orders of the defined types were placed in this encounter.

## 2022-12-30 NOTE — Patient Instructions (Signed)
It appears that you have come in contact with something on your chest to cause this rash.   Be mindful of anything on your skin such as your coat zipper, necklace, etc.   Use the triamcinolone cream for 5-7 days and then stop.   Use an ice pack to help with itching.   Follow up if you are getting worse.

## 2022-12-30 NOTE — Telephone Encounter (Signed)
Left message regarding 2nd no show. Informed pt that all future appointment would be cancelled, and she may call the clinic to schedule one visit at a time moving forward. Provided the clinic phone number.

## 2022-12-31 NOTE — Progress Notes (Deleted)
[name] D.[place] [facility] Phone: [phone]   Assessment and Plan:     There are no diagnoses linked to this encounter.  ***   Pertinent previous records reviewed include ***   Follow Up: ***     Subjective:   I, Aadvika Konen, am serving as a Education administrator for Doctor Glennon Mac   Chief Complaint: wrist, hand, and knee pain    HPI:    12/04/2022 Patient is  a 89 year old female complaining of wrist, hand, and knee pain. Patient states wrist pain for about 3-4 weeks, nom MOI, shoulder pain is at night , knees havent been bothering her    Wrist pain wakes her up at night and that radiates up to the shoulder or vice versa she is a side sleeper , tylenol for the pain and that seems to help ,  bio freeze helps as well , does get a little numbness and tingling in her fingers,   01/01/2023 Patient states    Relevant Historical Information: History of adenocarcinoma of cecum, GERD, hypertension  Additional pertinent review of systems negative.   Current Outpatient Medications:    acetaminophen (TYLENOL) 500 MG tablet, Take 500-1,000 mg by mouth every 6 (six) hours as needed (for pain.)., Disp: , Rfl:    albuterol (VENTOLIN HFA) 108 (90 Base) MCG/ACT inhaler, INHALE 2 PUFFS BY MOUTH EVERY 4 HOURS AS NEEDED FOR WHEEZE OR FOR SHORTNESS OF BREATH, Disp: 25.5 each, Rfl: 2   ALPRAZolam (XANAX) 0.5 MG tablet, TAKE 1 TABLET BY MOUTH EVERY DAY AS NEEDED FOR ANXIETY, Disp: 20 tablet, Rfl: 2   alum & mag hydroxide-simeth (MAALOX/MYLANTA) 200-200-20 MG/5ML suspension, Take 15 mLs by mouth every 6 (six) hours as needed for indigestion or heartburn., Disp: , Rfl:    amLODipine (NORVASC) 5 MG tablet, TAKE 1 TABLET BY MOUTH EVERY DAY, Disp: 90 tablet, Rfl: 1   atorvastatin (LIPITOR) 40 MG tablet, Take 1 tablet (40 mg total) by mouth daily., Disp: 90 tablet, Rfl: 3   benzonatate (TESSALON) 200 MG capsule, TAKE 1 CAPSULE  (200 MG TOTAL) BY MOUTH 3 (THREE) TIMES DAILY AS NEEDED FOR COUGH., Disp: 60 capsule, Rfl: 0   betamethasone dipropionate 0.05 % cream, APPLY TO AFFECTED AREA TWICE A DAY, Disp: 45 g, Rfl: 0   BREO ELLIPTA 100-25 MCG/ACT AEPB, INHALE 1 PUFF BY MOUTH EVERY DAY, Disp: 60 each, Rfl: 3   doxycycline (VIBRA-TABS) 100 MG tablet, Take 1 tablet (100 mg total) by mouth 2 (two) times daily., Disp: 14 tablet, Rfl: 0   ezetimibe (ZETIA) 10 MG tablet, Take 1 tablet (10 mg total) by mouth daily., Disp: 90 tablet, Rfl: 3   famotidine (PEPCID) 20 MG tablet, TAKE 1 TABLET BY MOUTH TWICE A DAY, Disp: 180 tablet, Rfl: 1   furosemide (LASIX) 40 MG tablet, TAKE 1 TABLET BY MOUTH EVERY DAY, Disp: 90 tablet, Rfl: 1   montelukast (SINGULAIR) 10 MG tablet, TAKE 1 TABLET BY MOUTH EVERYDAY AT BEDTIME, Disp: 90 tablet, Rfl: 1   nystatin cream (MYCOSTATIN), Apply 1 Application topically 2 (two) times daily. (Patient taking differently: Apply 1 Application topically as needed for dry skin.), Disp: 100 g, Rfl: 0   olmesartan (BENICAR) 40 MG tablet, TAKE 1 TABLET BY MOUTH EVERY DAY, Disp: 90 tablet, Rfl: 3   ondansetron (ZOFRAN-ODT) 4 MG disintegrating tablet, TAKE 1 TABLET BY MOUTH EVERY 8 HOURS AS NEEDED FOR NAUSEA AND VOMITING, Disp: 60 tablet, Rfl:  0   potassium chloride (KLOR-CON) 10 MEQ tablet, TAKE 1 TABLET BY MOUTH EVERY DAY, Disp: 90 tablet, Rfl: 1   sennosides-docusate sodium (SENOKOT-S) 8.6-50 MG tablet, Take 1 tablet by mouth daily as needed for constipation., Disp: , Rfl:    triamcinolone cream (KENALOG) 0.1 %, APPLY TO AFFECTED AREA TOPICALLY TWICE A DAY AS NEEDED FOR SKIN IRRITATION/RASH, Disp: 90 g, Rfl: 0  Current Facility-Administered Medications:    albuterol (PROVENTIL) (2.5 MG/3ML) 0.083% nebulizer solution 2.5 mg, 2.5 mg, Nebulization, Once, Hoyt Koch, MD   Objective:     There were no vitals filed for this visit.    There is no height or weight on file to calculate BMI.    Physical Exam:     ***   Electronically signed by:  [name] D.[facility] 3:56 PM 12/31/22

## 2023-01-01 ENCOUNTER — Ambulatory Visit: Payer: Medicare PPO | Admitting: Sports Medicine

## 2023-01-03 ENCOUNTER — Encounter: Payer: Medicare PPO | Admitting: Physical Therapy

## 2023-01-06 ENCOUNTER — Ambulatory Visit: Payer: Medicare PPO

## 2023-01-07 ENCOUNTER — Encounter: Payer: Self-pay | Admitting: Internal Medicine

## 2023-01-07 ENCOUNTER — Ambulatory Visit: Payer: Medicare PPO | Admitting: Internal Medicine

## 2023-01-07 ENCOUNTER — Ambulatory Visit: Payer: Medicare PPO | Attending: Internal Medicine | Admitting: Internal Medicine

## 2023-01-07 VITALS — BP 130/60 | HR 85 | Ht 63.0 in | Wt 168.0 lb

## 2023-01-07 DIAGNOSIS — R931 Abnormal findings on diagnostic imaging of heart and coronary circulation: Secondary | ICD-10-CM | POA: Diagnosis not present

## 2023-01-07 DIAGNOSIS — I700 Atherosclerosis of aorta: Secondary | ICD-10-CM

## 2023-01-07 DIAGNOSIS — I10 Essential (primary) hypertension: Secondary | ICD-10-CM | POA: Diagnosis not present

## 2023-01-07 NOTE — Progress Notes (Signed)
Cardiology Office Note:    Date:  01/07/2023   ID:  Jamie [name], DOB 1933-02-10, MRN [id number]  PCP:  Hoyt Koch, MD   [facility] Cardiologist:  Werner Lean, MD     Referring MD: Hoyt Koch, *   CC: Follow up testing  History of Present Illness:    Jamie [name] is a 89 y.o. female with a hx of HTN, HLD, aortic atherosclerosis, found to have aortic atherosclerosis and CAC, prior hx of PE, prior colon cancer. 2023: Worried about CAC scan; worked of HLD and had negative stress test.  Patient notes that she is doing well.   Still as active as every.  Does all ADLs by her self. There are no interval hospital/ED visit.    No chest pain or pressure .  No SOB/DOE and no PND/Orthopnea.  No weight gain or leg swelling.  No palpitations or syncope. She likes to cook: still can make a full meal.  Is working on getting more whole grains, oats, and salads.  Has R wrist pain.  All carpal tunnel maneuvers are negative.   Past Medical History:  Diagnosis Date   Adenocarcinoma of cecum ([place]) 09/28/2019   ANEMIA, IRON DEFICIENCY 09/10/2007   Anxiety    Arthritis    Asthma    Blood transfusion without reported diagnosis    Cataract 2008   bilateral cataract extraction   CATARACT EXTRACTION, HX OF 09/10/2007   DYSPEPSIA, HX OF 09/10/2007   GERD (gastroesophageal reflux disease)    HAIR LOSS 03/21/2009   HAY FEVER 09/10/2007   HEARING LOSS, SENSORINEURAL 09/10/2007   HYPERLIPIDEMIA 09/10/2007   HYPERTENSION 09/10/2007   LOW BACK PAIN 05/10/2008   Pain in joint, hand 03/01/2008   PEPTIC ULCER DISEASE 09/10/2007   POLYP, COLON 09/10/2007   POLYPECTOMY, HX OF 09/10/2007   Pulmonary embolism ([place]) 1- 2 non-occlusive 10/10/2019   SHINGLES 08/13/2009   SHOULDER PAIN, LEFT 02/12/2008   TAH/BSO, HX OF 09/10/2007   Tuberculosis    PATIENT DENIES AT PHONE INTERVIEW ON 12/12/2019    Past Surgical History:  Procedure Laterality  Date   ABDOMINAL HYSTERECTOMY     BREAST SURGERY     breast biopsy-benign   CATARACT EXTRACTION     COLONOSCOPY     EYE SURGERY     bilateral cataract with lens implant   IRRIGATION AND DEBRIDEMENT SEBACEOUS CYST     LAPAROSCOPIC RIGHT HEMI COLECTOMY Right 12/15/2019   Procedure: LAPAROSCOPIC RIGHT HEMI COLECTOMY WITH LYSIS OF ADHESIONS;  Surgeon: Ileana Roup, MD;  Location: [facility];  Service: General;  Laterality: Right;   TOTAL ABDOMINAL HYSTERECTOMY W/ BILATERAL SALPINGOOPHORECTOMY      Current Medications: Current Meds  Medication Sig   acetaminophen (TYLENOL) 500 MG tablet Take 500-1,000 mg by mouth every 6 (six) hours as needed (for pain.).   albuterol (VENTOLIN HFA) 108 (90 Base) MCG/ACT inhaler INHALE 2 PUFFS BY MOUTH EVERY 4 HOURS AS NEEDED FOR WHEEZE OR FOR SHORTNESS OF BREATH   ALPRAZolam (XANAX) 0.5 MG tablet TAKE 1 TABLET BY MOUTH EVERY DAY AS NEEDED FOR ANXIETY   alum & mag hydroxide-simeth (MAALOX/MYLANTA) 200-200-20 MG/5ML suspension Take 15 mLs by mouth every 6 (six) hours as needed for indigestion or heartburn.   amLODipine (NORVASC) 5 MG tablet TAKE 1 TABLET BY MOUTH EVERY DAY   atorvastatin (LIPITOR) 40 MG tablet Take 1 tablet (40 mg total) by mouth daily.   benzonatate (TESSALON) 200 MG capsule TAKE 1  CAPSULE (200 MG TOTAL) BY MOUTH 3 (THREE) TIMES DAILY AS NEEDED FOR COUGH.   betamethasone dipropionate 0.05 % cream APPLY TO AFFECTED AREA TWICE A DAY   BREO ELLIPTA 100-25 MCG/ACT AEPB INHALE 1 PUFF BY MOUTH EVERY DAY   ezetimibe (ZETIA) 10 MG tablet Take 1 tablet (10 mg total) by mouth daily.   famotidine (PEPCID) 20 MG tablet TAKE 1 TABLET BY MOUTH TWICE A DAY   furosemide (LASIX) 40 MG tablet TAKE 1 TABLET BY MOUTH EVERY DAY   montelukast (SINGULAIR) 10 MG tablet TAKE 1 TABLET BY MOUTH EVERYDAY AT BEDTIME   nystatin cream (MYCOSTATIN) Apply 1 Application topically 2 (two) times daily. (Patient taking differently: Apply 1 Application topically as needed for  dry skin.)   olmesartan (BENICAR) 40 MG tablet TAKE 1 TABLET BY MOUTH EVERY DAY   ondansetron (ZOFRAN-ODT) 4 MG disintegrating tablet TAKE 1 TABLET BY MOUTH EVERY 8 HOURS AS NEEDED FOR NAUSEA AND VOMITING   potassium chloride (KLOR-CON) 10 MEQ tablet TAKE 1 TABLET BY MOUTH EVERY DAY   sennosides-docusate sodium (SENOKOT-S) 8.6-50 MG tablet Take 1 tablet by mouth daily as needed for constipation.   triamcinolone cream (KENALOG) 0.1 % APPLY TO AFFECTED AREA TOPICALLY TWICE A DAY AS NEEDED FOR SKIN IRRITATION/RASH   Current Facility-Administered Medications for the 01/07/23 encounter (Office Visit) with Werner Lean, MD  Medication   albuterol (PROVENTIL) (2.5 MG/3ML) 0.083% nebulizer solution 2.5 mg     Allergies:   Aspirin and Latex   Social History   Socioeconomic History   Marital status: Widowed    Spouse name: Not on file   Number of children: 4   Years of education: Not on file   Highest education level: Not on file  Occupational History   Occupation: retired  Tobacco Use   Smoking status: Former    Packs/day: 0.20    Years: 25.00    Total pack years: 5.00    Types: Cigarettes    Quit date: 12/01/1989    Years since quitting: 33.1   Smokeless tobacco: Never   Tobacco comments:    smoked a long time ago; Quit 79 or 89/ number is approximate  Vaping Use   Vaping Use: Never used  Substance and Sexual Activity   Alcohol use: No    Alcohol/week: 0.0 standard drinks of alcohol   Drug use: No   Sexual activity: Not Currently  Other Topics Concern   Not on file  Social History Narrative   3 sons, 8,59, 57 and 1 daughter 22, 4 grandchildren   SO with multiple medical problems   Social Determinants of Health   Financial Resource Strain: Low Risk  (03/11/2022)   Overall Financial Resource Strain (CARDIA)    Difficulty of Paying Living Expenses: Not hard at all  Food Insecurity: No Food Insecurity (03/11/2022)   Hunger Vital Sign    Worried About Running Out of  Food in the Last Year: Never true    [place] in the Last Year: Never true  Transportation Needs: No Transportation Needs (03/11/2022)   PRAPARE - Hydrologist (Medical): No    Lack of Transportation (Non-Medical): No  Physical Activity: Inactive (03/11/2022)   Exercise Vital Sign    Days of Exercise per Week: 0 days    Minutes of Exercise per Session: 0 min  Stress: No Stress Concern Present (03/11/2022)   [place]    Feeling of  Stress : Not at all  Social Connections: Moderately Isolated (03/11/2022)   Social Connection and Isolation Panel [NHANES]    Frequency of Communication with Friends and Family: More than three times a week    Frequency of Social Gatherings with Friends and Family: More than three times a week    Attends Religious Services: More than 4 times per year    Active Member of [employer] or Organizations: No    Attends Archivist Meetings: Never    Marital Status: Widowed     Family History: The patient's family history includes Cancer (age of onset: 40) in her son and son; Coronary artery disease in her mother; Heart disease in her mother; Hypertension in her mother. There is no history of Diabetes.  ROS:   Please see the history of present illness.     All other systems reviewed and are negative.  EKGs/Labs/Other Studies Reviewed:    The following studies were reviewed today:   EKG:  EKG is  ordered today.  The ekg ordered today demonstrates  07/10/22: SR 89   Cardiac Studies & Procedures     STRESS TESTS  MYOCARDIAL PERFUSION IMAGING 07/16/2022  Narrative   The study is normal. The study is low risk.   No ST deviation was noted.   Left ventricular function is normal. Nuclear stress EF: 69 %. The left ventricular ejection fraction is hyperdynamic (>65%). End diastolic cavity size is normal.   Prior study not available for comparison.      CT  SCANS  CT CARDIAC SCORING (SELF PAY ONLY) 04/17/2022  Addendum 04/17/2022  2:12 PM ADDENDUM REPORT: 04/17/2022 14:09  EXAM: OVER-READ INTERPRETATION  CT CHEST  The following report is an over-read performed by radiologist Dr. [facility] on 04/17/2022. This over-read does not include interpretation of cardiac or coronary anatomy or pathology. The cardiac interpretation by the cardiologist is attached.  COMPARISON:  Chest CT 11/13/2021  FINDINGS: The thoracic aorta is normal in caliber.  No pericardial effusion.  No mediastinal or hilar mass or lymphadenopathy. The visualized esophagus is grossly.  No acute pulmonary findings or worrisome pulmonary lesions. The central tracheobronchial tree is unremarkable. No pleural effusion.  No significant upper abdominal findings.  The bony structures are intact.  IMPRESSION: No significant findings extracardiac findings.   Electronically Signed By: Marijo Sanes M.D. On: 04/17/2022 14:09  Narrative CLINICAL DATA:  Risk stratification: 89 year old African American Female  EXAM: Coronary Calcium Score  TECHNIQUE: The patient was scanned on a [employer]. Axial non-contrast 3 mm slices were carried out through the heart. The data set was analyzed on a dedicated work station and scored using the [place].  FINDINGS: Non-cardiac: See separate report from [facility].  Ascending Aorta: Normal caliber.  Aortic atherosclerosis.  Pericardium: There is mild pericardial calcification posterior to the anterolateral wall of the left ventricle.  Coronary arteries: Normal origins.  Coronary Calcium Score:  Left main: 12  Left anterior descending artery: 864  Left circumflex artery: 742  Right coronary artery: 546  Total: 2164  IMPRESSION: 1. Coronary calcium score of 2164- elevated for age and gender.  2.  Aortic atherosclerosis.  3.  Mild pericardial  calcification.  RECOMMENDATIONS:  Coronary artery calcium (CAC) score is a strong predictor of incident coronary heart disease (CHD) and provides predictive information beyond traditional risk factors. CAC scoring is reasonable to use in the decision to withhold, postpone, or initiate statin therapy in intermediate-risk or  selected borderline-risk asymptomatic adults (age 88-75 years and LDL-C >=70 to <190 mg/dL) who do not have diabetes or established atherosclerotic cardiovascular disease (ASCVD).* In intermediate-risk (10-year ASCVD risk >=7.5% to <20%) adults or selected borderline-risk (10-year ASCVD risk >=5% to <7.5%) adults in whom a CAC score is measured for the purpose of making a treatment decision the following recommendations have been made:  If CAC = 0, it is reasonable to withhold statin therapy and reassess in 5 to 10 years, as long as higher risk conditions are absent (diabetes mellitus, family history of premature CHD in first degree relatives (males <55 years; females <65 years), cigarette smoking, LDL >=190 mg/dL or other independent risk factors).  If CAC is 1 to 99, it is reasonable to initiate statin therapy for patients >=57 years of age.  If CAC is >=100 or >=75th percentile, it is reasonable to initiate statin therapy at any age.  Cardiology referral should be considered for patients with CAC scores =400 or >=75th percentile.  *2018 AHA/ACC/AACVPR/AAPA/ABC/ACPM/ADA/AGS/APhA/ASPC/NLA/PCNA Guideline on the Management of Blood Cholesterol: A Report of the American College of Cardiology/American Heart Association Task Force on Clinical Practice Guidelines. J Am Coll Cardiol. 2019;73(24):3168-3209.  Rudean Haskell, MD  Electronically Signed: By: Rudean Haskell M.D. On: 04/17/2022 13:17            Recent Labs: 02/09/2022: Magnesium 2.2 05/19/2022: Hemoglobin 12.6; Platelet Count 201 07/10/2022: BUN 7; Creatinine, Ser 0.77; Potassium  4.0; Sodium 144 10/13/2022: ALT 13  Recent Lipid Panel    Component Value Date/Time   CHOL 135 10/13/2022 0754   TRIG 47 10/13/2022 0754   HDL 50 10/13/2022 0754   CHOLHDL 2.7 10/13/2022 0754   CHOLHDL 3 06/05/2021 0851   VLDL 12.4 06/05/2021 0851   LDLCALC 74 10/13/2022 0754        Physical Exam:    VS:  BP 130/60   Pulse 85   Ht '5\' 3"'$  (1.6 m)   Wt 168 lb (76.2 kg)   SpO2 99%   BMI 29.76 kg/m     Wt Readings from Last 3 Encounters:  01/07/23 168 lb (76.2 kg)  12/04/22 173 lb (78.5 kg)  08/26/22 173 lb (78.5 kg)    GEN:  Well nourished, well developed in no acute distress HEENT: Normal NECK: No JVD LYMPHATICS: No lymphadenopathy CARDIAC: RRR, no murmurs, rubs, gallops RESPIRATORY:  Clear to auscultation without rales, wheezing or rhonchi  ABDOMEN: Soft, non-tender, non-distended MUSCULOSKELETAL:  No edema; No deformity  SKIN: Warm and dry NEUROLOGIC:  Alert and oriented x 3 PSYCHIATRIC:  Anxious  ASSESSMENT:    1. Essential hypertension   2. Aortic atherosclerosis (HCC)   3. Elevated coronary artery calcium score     PLAN:    Elevated CAC Aortic atherosclerosis HLD - on high dose statin and zetia - has scheduled lab in follow up - discussed dietary changes  Pericardal calcification - may be incidental, no TB risk factors or sarcoid hx; no immunosuppression she is in monitoring for her colon cancer  HTN - improved with stress improvement - on norvasx 5, benicar 40 mg - no change in therapy  One year me or APP         Medication Adjustments/Labs and Tests Ordered: Current medicines are reviewed at length with the patient today.  Concerns regarding medicines are outlined above.  No orders of the defined types were placed in this encounter.  No orders of the defined types were placed in this encounter.   Patient Instructions  Medication Instructions:  Your physician recommends that you continue on your current medications as directed.  Please refer to the Current Medication list given to you today.  *If you need a refill on your cardiac medications before your next appointment, please call your pharmacy*   Lab Work: NONE If you have labs (blood work) drawn today and your tests are completely normal, you will receive your results only by: [place] (if you have MyChart) OR A paper copy in the mail If you have any lab test that is abnormal or we need to change your treatment, we will call you to review the results.   Testing/Procedures: NONE   Follow-Up: At [facility], you and your health needs are our priority.  As part of our continuing mission to provide you with exceptional heart care, we have created designated Provider Care Teams.  These Care Teams include your primary Cardiologist (physician) and Advanced Practice Providers (APPs -  Physician Assistants and Nurse Practitioners) who all work together to provide you with the care you need, when you need it.  We recommend signing up for the patient portal called "MyChart".  Sign up information is provided on this After Visit Summary.  MyChart is used to connect with patients for Virtual Visits (Telemedicine).  Patients are able to view lab/test results, encounter notes, upcoming appointments, etc.  Non-urgent messages can be sent to your provider as well.   To learn more about what you can do with MyChart, go to [email or web address].    Your next appointment:   1 year(s)  Provider:   Rudean Haskell, MD       Signed, Werner Lean, MD  01/07/2023 1:48 PM    [place]

## 2023-01-07 NOTE — Patient Instructions (Signed)
Medication Instructions:  Your physician recommends that you continue on your current medications as directed. Please refer to the Current Medication list given to you today.  *If you need a refill on your cardiac medications before your next appointment, please call your pharmacy*   Lab Work: NONE If you have labs (blood work) drawn today and your tests are completely normal, you will receive your results only by: MyChart Message (if you have MyChart) OR A paper copy in the mail If you have any lab test that is abnormal or we need to change your treatment, we will call you to review the results.   Testing/Procedures: NONE   Follow-Up: At [facility], you and your health needs are our priority.  As part of our continuing mission to provide you with exceptional heart care, we have created designated Provider Care Teams.  These Care Teams include your primary Cardiologist (physician) and Advanced Practice Providers (APPs -  Physician Assistants and Nurse Practitioners) who all work together to provide you with the care you need, when you need it.  We recommend signing up for the patient portal called "MyChart".  Sign up information is provided on this After Visit Summary.  MyChart is used to connect with patients for Virtual Visits (Telemedicine).  Patients are able to view lab/test results, encounter notes, upcoming appointments, etc.  Non-urgent messages can be sent to your provider as well.   To learn more about what you can do with MyChart, go to [email or web address].    Your next appointment:   1 year(s)  Provider:   Mahesh Chandrasekhar, MD     

## 2023-01-15 ENCOUNTER — Encounter: Payer: Medicare PPO | Admitting: Physical Therapy

## 2023-01-26 ENCOUNTER — Other Ambulatory Visit: Payer: Medicare PPO

## 2023-02-03 ENCOUNTER — Other Ambulatory Visit: Payer: Self-pay | Admitting: Internal Medicine

## 2023-02-24 ENCOUNTER — Ambulatory Visit: Payer: Medicare PPO | Admitting: Internal Medicine

## 2023-02-24 ENCOUNTER — Encounter: Payer: Self-pay | Admitting: Internal Medicine

## 2023-02-24 VITALS — BP 130/60 | HR 75 | Temp 98.6°F | Ht 63.0 in | Wt 167.0 lb

## 2023-02-24 DIAGNOSIS — M25561 Pain in right knee: Secondary | ICD-10-CM | POA: Diagnosis not present

## 2023-02-24 DIAGNOSIS — G8929 Other chronic pain: Secondary | ICD-10-CM | POA: Diagnosis not present

## 2023-02-24 MED ORDER — PREDNISONE 20 MG PO TABS: 40.0000 mg | ORAL_TABLET | Freq: Every day | ORAL | 0 refills | Status: DC

## 2023-02-24 MED ORDER — DICLOFENAC SODIUM 1 % EX GEL: 2.0000 g | Freq: Four times a day (QID) | CUTANEOUS | 0 refills | Status: DC

## 2023-02-24 NOTE — Patient Instructions (Addendum)
We have sent in the prednisone to take 2 pills daily for 5 days.  We have also sent in voltaren gel to use up to 4 times a day on the knee to help with pain and help this heal faster.

## 2023-02-24 NOTE — Progress Notes (Unsigned)
   Subjective:   Patient ID: Jamie [name], female    DOB: 1933-01-04, 89 y.o.   MRN: [id number]  HPI The patient is an 89 YO female coming in for right knee pain. Years but worsening with time and several weeks bad.   Review of Systems  Constitutional: Negative.   HENT: Negative.    Eyes: Negative.   Respiratory:  Negative for cough, chest tightness and shortness of breath.   Cardiovascular:  Negative for chest pain, palpitations and leg swelling.  Gastrointestinal:  Negative for abdominal distention, abdominal pain, constipation, diarrhea, nausea and vomiting.  Musculoskeletal:  Positive for arthralgias and joint swelling.  Skin: Negative.   Neurological: Negative.   Psychiatric/Behavioral: Negative.      Objective:  Physical Exam Constitutional:      Appearance: She is well-developed.  HENT:     Head: Normocephalic and atraumatic.  Cardiovascular:     Rate and Rhythm: Normal rate and regular rhythm.  Pulmonary:     Effort: Pulmonary effort is normal. No respiratory distress.     Breath sounds: Normal breath sounds. No wheezing or rales.  Abdominal:     General: Bowel sounds are normal. There is no distension.     Palpations: Abdomen is soft.     Tenderness: There is no abdominal tenderness. There is no rebound.  Musculoskeletal:        General: Tenderness present.     Cervical back: Normal range of motion.     Comments: Right knee with posterior knee pain and mild swelling. No meniscus pain.  Skin:    General: Skin is warm and dry.  Neurological:     Mental Status: She is alert and oriented to person, place, and time.     Coordination: Coordination normal.     Vitals:   02/24/23 1341  BP: 130/60  Pulse: 75  Temp: 98.6 F (37 C)  TempSrc: Oral  SpO2: 98%  Weight: 167 lb (75.8 kg)  Height: 5\' 3"  (1.6 m)    Assessment & Plan:

## 2023-02-25 DIAGNOSIS — M25561 Pain in right knee: Secondary | ICD-10-CM | POA: Insufficient documentation

## 2023-02-25 NOTE — Assessment & Plan Note (Addendum)
Acute on chronic knee pain. Posterior and likely related to arthritis or possible baker's cyst. She did not have fall or injury to suggest recent meniscus damage. No instability. Rx prednisone 5 day course. Offered knee injection and we prefer to try oral first. If no improvement can do knee intra-articular corticosteroid injection. Rx diclofenac gel.

## 2023-03-02 ENCOUNTER — Ambulatory Visit: Payer: Medicare PPO | Admitting: Internal Medicine

## 2023-03-02 ENCOUNTER — Encounter: Payer: Self-pay | Admitting: Internal Medicine

## 2023-03-02 VITALS — BP 140/58 | HR 83 | Temp 98.3°F | Ht 63.0 in | Wt 172.0 lb

## 2023-03-02 DIAGNOSIS — K219 Gastro-esophageal reflux disease without esophagitis: Secondary | ICD-10-CM | POA: Diagnosis not present

## 2023-03-02 DIAGNOSIS — M25561 Pain in right knee: Secondary | ICD-10-CM

## 2023-03-02 DIAGNOSIS — G8929 Other chronic pain: Secondary | ICD-10-CM

## 2023-03-02 DIAGNOSIS — E782 Mixed hyperlipidemia: Secondary | ICD-10-CM | POA: Diagnosis not present

## 2023-03-02 DIAGNOSIS — L608 Other nail disorders: Secondary | ICD-10-CM | POA: Diagnosis not present

## 2023-03-02 DIAGNOSIS — I10 Essential (primary) hypertension: Secondary | ICD-10-CM | POA: Diagnosis not present

## 2023-03-02 DIAGNOSIS — Z0000 Encounter for general adult medical examination without abnormal findings: Secondary | ICD-10-CM | POA: Diagnosis not present

## 2023-03-02 DIAGNOSIS — D649 Anemia, unspecified: Secondary | ICD-10-CM | POA: Diagnosis not present

## 2023-03-02 NOTE — Progress Notes (Signed)
   Subjective:   Patient ID: Jamie [name], female    DOB: Oct 28, 1933, 89 y.o.   MRN: [id number]  HPI The patient is here for physical.  PMH, [facility], social history reviewed and updated  Review of Systems  Constitutional: Negative.   HENT: Negative.    Eyes: Negative.   Respiratory:  Negative for cough, chest tightness and shortness of breath.   Cardiovascular:  Negative for chest pain, palpitations and leg swelling.  Gastrointestinal:  Negative for abdominal distention, abdominal pain, constipation, diarrhea, nausea and vomiting.  Musculoskeletal:  Positive for arthralgias and gait problem.  Skin: Negative.   Psychiatric/Behavioral: Negative.      Objective:  Physical Exam Constitutional:      Appearance: She is well-developed.  HENT:     Head: Normocephalic and atraumatic.  Cardiovascular:     Rate and Rhythm: Normal rate and regular rhythm.  Pulmonary:     Effort: Pulmonary effort is normal. No respiratory distress.     Breath sounds: Normal breath sounds. No wheezing or rales.  Abdominal:     General: Bowel sounds are normal. There is no distension.     Palpations: Abdomen is soft.     Tenderness: There is no abdominal tenderness. There is no rebound.  Musculoskeletal:        General: Tenderness present.     Cervical back: Normal range of motion.  Skin:    General: Skin is warm and dry.  Neurological:     Mental Status: She is alert and oriented to person, place, and time.     Coordination: Coordination normal.     Vitals:   03/02/23 1007 03/02/23 1009  BP: (!) 140/58 (!) 140/58  Pulse: 83   Temp: 98.3 F (36.8 C)   TempSrc: Oral   SpO2: 99%   Weight: 172 lb (78 kg)   Height: 5\' 3"  (1.6 m)     Assessment & Plan:

## 2023-03-02 NOTE — Assessment & Plan Note (Signed)
Controlled with pepcid 20 mg BID and will continue.

## 2023-03-02 NOTE — Assessment & Plan Note (Signed)
Overall improving and using voltaren gel on the knee. Continue and tylenol oral as needed.

## 2023-03-02 NOTE — Assessment & Plan Note (Signed)
Flu shot up to date. Covid-19 counseled. Pneumonia complete. Shingrix due at pharmacy. Tetanus due at pharmacy. Colonoscopy aged out. Mammogram aged out, pap smear aged out and dexa complete. Counseled about sun safety and mole surveillance. Counseled about the dangers of distracted driving. Given 10 year screening recommendations.

## 2023-03-02 NOTE — Assessment & Plan Note (Signed)
Refer to podiatry for hard to trim toenails.

## 2023-03-02 NOTE — Assessment & Plan Note (Signed)
Taking lipitor 40 mg daily and zetia 10 mg daily and getting repeat lipid panel through cardiology in next few weeks.

## 2023-03-02 NOTE — Assessment & Plan Note (Signed)
BP is at goal on regimen amlodipine 5 mg daily and lasix 40 mg daily and benicar 40 mg daily. Recent BMP at goal no labs due today. Continue at same doses.

## 2023-03-02 NOTE — Assessment & Plan Note (Signed)
Recent CBC stable and taking multivitamin daily. No clinical signs of blood loss.

## 2023-03-09 ENCOUNTER — Ambulatory Visit: Payer: Medicare PPO | Attending: Internal Medicine

## 2023-03-09 DIAGNOSIS — E782 Mixed hyperlipidemia: Secondary | ICD-10-CM

## 2023-03-10 ENCOUNTER — Ambulatory Visit: Payer: Medicare PPO | Admitting: Podiatry

## 2023-03-10 ENCOUNTER — Encounter: Payer: Self-pay | Admitting: Podiatry

## 2023-03-10 DIAGNOSIS — D2372 Other benign neoplasm of skin of left lower limb, including hip: Secondary | ICD-10-CM | POA: Diagnosis not present

## 2023-03-10 DIAGNOSIS — M79676 Pain in unspecified toe(s): Secondary | ICD-10-CM

## 2023-03-10 DIAGNOSIS — D2371 Other benign neoplasm of skin of right lower limb, including hip: Secondary | ICD-10-CM

## 2023-03-10 DIAGNOSIS — B351 Tinea unguium: Secondary | ICD-10-CM | POA: Diagnosis not present

## 2023-03-10 LAB — LIPID PANEL
Chol/HDL Ratio: 2.4 ratio (ref 0.0–4.4)
Cholesterol, Total: 141 mg/dL (ref 100–199)
HDL: 58 mg/dL (ref >39)
LDL Chol Calc (NIH): 72 mg/dL (ref 0–99)
Triglycerides: 48 mg/dL (ref 0–149)
VLDL Cholesterol Cal: 11 mg/dL (ref 5–40)

## 2023-03-10 LAB — ALT: ALT: 16 IU/L (ref 0–32)

## 2023-03-10 NOTE — Progress Notes (Signed)
Subjective:  Patient ID: Jamie [name], female    DOB: November 27, 1933,  MRN: [id number] HPI Chief Complaint  Patient presents with   Nail Problem    Toenails - hallux left - she is unable to trim herself anymore due to thickness, would like others trimmed as well, calluses also bilateral    New Patient (Initial Visit)    Est pt 2018    89 y.o. female presents with the above complaint.   ROS: Denies fever chills nausea vomiting muscle aches pains calf pain back pain chest pain shortness of breath.  Past Medical History:  Diagnosis Date   Adenocarcinoma of cecum 09/28/2019   ANEMIA, IRON DEFICIENCY 09/10/2007   Anxiety    Arthritis    Asthma    Blood transfusion without reported diagnosis    Cataract 2008   bilateral cataract extraction   CATARACT EXTRACTION, HX OF 09/10/2007   DYSPEPSIA, HX OF 09/10/2007   GERD (gastroesophageal reflux disease)    HAIR LOSS 03/21/2009   HAY FEVER 09/10/2007   HEARING LOSS, SENSORINEURAL 09/10/2007   HYPERLIPIDEMIA 09/10/2007   HYPERTENSION 09/10/2007   LOW BACK PAIN 05/10/2008   Pain in joint, hand 03/01/2008   PEPTIC ULCER DISEASE 09/10/2007   POLYP, COLON 09/10/2007   POLYPECTOMY, HX OF 09/10/2007   Pulmonary embolism (HCC) 1- 2 non-occlusive 10/10/2019   SHINGLES 08/13/2009   SHOULDER PAIN, LEFT 02/12/2008   TAH/BSO, HX OF 09/10/2007   Tuberculosis    PATIENT DENIES AT PHONE INTERVIEW ON 12/12/2019   Past Surgical History:  Procedure Laterality Date   ABDOMINAL HYSTERECTOMY     BREAST SURGERY     breast biopsy-benign   CATARACT EXTRACTION     COLONOSCOPY     EYE SURGERY     bilateral cataract with lens implant   IRRIGATION AND DEBRIDEMENT SEBACEOUS CYST     LAPAROSCOPIC RIGHT HEMI COLECTOMY Right 12/15/2019   Procedure: LAPAROSCOPIC RIGHT HEMI COLECTOMY WITH LYSIS OF ADHESIONS;  Surgeon: Andria Meuse, MD;  Location: [facility];  Service: General;  Laterality: Right;   TOTAL ABDOMINAL HYSTERECTOMY W/ BILATERAL  SALPINGOOPHORECTOMY      Current Outpatient Medications:    acetaminophen (TYLENOL) 500 MG tablet, Take 500-1,000 mg by mouth every 6 (six) hours as needed (for pain.)., Disp: , Rfl:    albuterol (VENTOLIN HFA) 108 (90 Base) MCG/ACT inhaler, INHALE 2 PUFFS BY MOUTH EVERY 4 HOURS AS NEEDED FOR WHEEZE OR FOR SHORTNESS OF BREATH, Disp: 25.5 each, Rfl: 1   ALPRAZolam (XANAX) 0.5 MG tablet, TAKE 1 TABLET BY MOUTH EVERY DAY AS NEEDED FOR ANXIETY, Disp: 20 tablet, Rfl: 2   alum & mag hydroxide-simeth (MAALOX/MYLANTA) 200-200-20 MG/5ML suspension, Take 15 mLs by mouth every 6 (six) hours as needed for indigestion or heartburn., Disp: , Rfl:    amLODipine (NORVASC) 5 MG tablet, TAKE 1 TABLET BY MOUTH EVERY DAY, Disp: 90 tablet, Rfl: 1   atorvastatin (LIPITOR) 40 MG tablet, Take 1 tablet (40 mg total) by mouth daily., Disp: 90 tablet, Rfl: 3   benzonatate (TESSALON) 200 MG capsule, TAKE 1 CAPSULE (200 MG TOTAL) BY MOUTH 3 (THREE) TIMES DAILY AS NEEDED FOR COUGH., Disp: 60 capsule, Rfl: 0   betamethasone dipropionate 0.05 % cream, APPLY TO AFFECTED AREA TWICE A DAY, Disp: 45 g, Rfl: 0   BREO ELLIPTA 100-25 MCG/ACT AEPB, INHALE 1 PUFF BY MOUTH EVERY DAY, Disp: 60 each, Rfl: 3   diclofenac Sodium (VOLTAREN) 1 % GEL, Apply 2 g topically 4 (four) times daily., Disp:  100 g, Rfl: 0   ezetimibe (ZETIA) 10 MG tablet, Take 1 tablet (10 mg total) by mouth daily., Disp: 90 tablet, Rfl: 3   famotidine (PEPCID) 20 MG tablet, TAKE 1 TABLET BY MOUTH TWICE A DAY, Disp: 180 tablet, Rfl: 1   furosemide (LASIX) 40 MG tablet, TAKE 1 TABLET BY MOUTH EVERY DAY, Disp: 90 tablet, Rfl: 1   montelukast (SINGULAIR) 10 MG tablet, TAKE 1 TABLET BY MOUTH EVERYDAY AT BEDTIME, Disp: 90 tablet, Rfl: 1   nystatin cream (MYCOSTATIN), Apply 1 Application topically 2 (two) times daily. (Patient taking differently: Apply 1 Application topically as needed for dry skin.), Disp: 100 g, Rfl: 0   olmesartan (BENICAR) 40 MG tablet, TAKE 1 TABLET BY  MOUTH EVERY DAY, Disp: 90 tablet, Rfl: 3   ondansetron (ZOFRAN-ODT) 4 MG disintegrating tablet, TAKE 1 TABLET BY MOUTH EVERY 8 HOURS AS NEEDED FOR NAUSEA AND VOMITING, Disp: 60 tablet, Rfl: 0   potassium chloride (KLOR-CON) 10 MEQ tablet, TAKE 1 TABLET BY MOUTH EVERY DAY, Disp: 90 tablet, Rfl: 1   sennosides-docusate sodium (SENOKOT-S) 8.6-50 MG tablet, Take 1 tablet by mouth daily as needed for constipation., Disp: , Rfl:    triamcinolone cream (KENALOG) 0.1 %, APPLY TO AFFECTED AREA TOPICALLY TWICE A DAY AS NEEDED FOR SKIN IRRITATION/RASH, Disp: 90 g, Rfl: 0  Allergies  Allergen Reactions   Aspirin Other (See Comments)    Stomach upset   Latex Itching and Rash   Review of Systems Objective:  There were no vitals filed for this visit.  General: Well developed, nourished, in no acute distress, alert and oriented x3   Dermatological: Skin is warm, dry and supple bilateral. Nails x 10 are thick yellow dystrophic clinically mycotic.  Remaining integument appears unremarkable at this time. There are no open sores, no preulcerative lesions, no rash or signs of infection present.  Vascular: Dorsalis Pedis artery and Posterior Tibial artery pedal pulses are 2/4 bilateral with immedate capillary fill time. Pedal hair growth present. No varicosities and no lower extremity edema present bilateral.   Neruologic: Grossly intact via light touch bilateral. Vibratory intact via tuning fork bilateral. Protective threshold with Semmes Wienstein monofilament intact to all pedal sites bilateral. Patellar and Achilles deep tendon reflexes 2+ bilateral. No Babinski or clonus noted bilateral.   Musculoskeletal: No gross boney pedal deformities bilateral. No pain, crepitus, or limitation noted with foot and ankle range of motion bilateral. Muscular strength 5/5 in all groups tested bilateral.  Gait: Unassisted, Nonantalgic.    Radiographs:  None taken  Assessment & Plan:   Assessment: Pain limb secondary  to onychomycosis  Plan: Debridement of toenails 1 through 5 bilateral     Cadence Minton T. [place], [place]

## 2023-03-17 ENCOUNTER — Other Ambulatory Visit: Payer: Self-pay | Admitting: Internal Medicine

## 2023-03-17 ENCOUNTER — Telehealth: Payer: Self-pay | Admitting: Internal Medicine

## 2023-03-17 NOTE — Telephone Encounter (Signed)
Notified pt w/MD response.../lmb 

## 2023-03-17 NOTE — Telephone Encounter (Signed)
I do not see any information about what patient is currently taking or using for the pain (rx and otc products).

## 2023-03-17 NOTE — Telephone Encounter (Signed)
Ok to continue tylenol up to 1000 mg TID. Add lidocaine patch over the counter or voltaren gel over the counter to help.

## 2023-03-17 NOTE — Telephone Encounter (Signed)
Called pt gave her MD response. Pt states she been taking otc Tylenol 500 mg. Inform pt will call bck w/ MD recommendation.Marland KitchenRaechel Chute

## 2023-03-17 NOTE — Telephone Encounter (Signed)
Patient said she has been seen for knee pain multiple times. She said it is still hurting and would like to know what Dr. Okey Dupre would recommend. She would like a callback at [phone].

## 2023-03-19 ENCOUNTER — Other Ambulatory Visit: Payer: Self-pay | Admitting: Internal Medicine

## 2023-03-26 ENCOUNTER — Telehealth: Payer: Self-pay | Admitting: Internal Medicine

## 2023-03-26 NOTE — Telephone Encounter (Signed)
Called patient to schedule Medicare Annual Wellness Visit (AWV). Left message for patient to call back and schedule Medicare Annual Wellness Visit (AWV).  Last date of AWV: 03/11/2022  Please schedule an appointment at any time with [facility].  If any questions, please contact me at [phone] .  Thank you ,  Randon Goldsmith Care Guide [facility] AWV TEAM Direct Dial: [phone]

## 2023-04-04 ENCOUNTER — Other Ambulatory Visit: Payer: Self-pay | Admitting: Internal Medicine

## 2023-05-16 ENCOUNTER — Other Ambulatory Visit: Payer: Self-pay | Admitting: Internal Medicine

## 2023-05-18 ENCOUNTER — Encounter: Payer: Self-pay | Admitting: Internal Medicine

## 2023-05-18 ENCOUNTER — Ambulatory Visit: Payer: Medicare PPO | Admitting: Internal Medicine

## 2023-05-18 VITALS — BP 130/60 | HR 73 | Temp 98.5°F | Ht 63.0 in | Wt 174.0 lb

## 2023-05-18 DIAGNOSIS — M25531 Pain in right wrist: Secondary | ICD-10-CM | POA: Diagnosis not present

## 2023-05-18 MED ORDER — DICLOFENAC SODIUM 1 % EX GEL: 2.0000 g | Freq: Four times a day (QID) | CUTANEOUS | 5 refills | Status: AC

## 2023-05-18 NOTE — Progress Notes (Unsigned)
   Subjective:   Patient ID: Jamie [name], female    DOB: 06-07-1933, 89 y.o.   MRN: [id number]  HPI The patient is an 89 YO female coming in for wrist pain. Has ran out of voltaren gel she used on knees in past.   Review of Systems  Constitutional: Negative.   HENT: Negative.    Eyes: Negative.   Respiratory:  Negative for cough, chest tightness and shortness of breath.   Cardiovascular:  Negative for chest pain, palpitations and leg swelling.  Gastrointestinal:  Negative for abdominal distention, abdominal pain, constipation, diarrhea, nausea and vomiting.  Musculoskeletal:  Positive for arthralgias and myalgias.  Skin: Negative.   Neurological: Negative.   Psychiatric/Behavioral: Negative.      Objective:  Physical Exam Constitutional:      Appearance: She is well-developed.  HENT:     Head: Normocephalic and atraumatic.  Cardiovascular:     Rate and Rhythm: Normal rate and regular rhythm.  Pulmonary:     Effort: Pulmonary effort is normal. No respiratory distress.     Breath sounds: Normal breath sounds. No wheezing or rales.  Abdominal:     General: Bowel sounds are normal. There is no distension.     Palpations: Abdomen is soft.     Tenderness: There is no abdominal tenderness. There is no rebound.  Musculoskeletal:        General: Tenderness present.     Cervical back: Normal range of motion.  Skin:    General: Skin is warm and dry.  Neurological:     Mental Status: She is alert and oriented to person, place, and time.     Coordination: Coordination abnormal.     Comments: Wheelchair today, walks short distances at home     Vitals:   05/18/23 1406  BP: 130/60  Pulse: 73  Temp: 98.5 F (36.9 C)  TempSrc: Oral  SpO2: 98%  Weight: 174 lb (78.9 kg)  Height: 5\' 3"  (1.6 m)    Assessment & Plan:

## 2023-05-18 NOTE — Patient Instructions (Signed)
We have sent in the voltaren gel to use on the hands and the shoulders.

## 2023-05-19 ENCOUNTER — Other Ambulatory Visit: Payer: Self-pay

## 2023-05-19 DIAGNOSIS — C180 Malignant neoplasm of cecum: Secondary | ICD-10-CM

## 2023-05-19 DIAGNOSIS — M25531 Pain in right wrist: Secondary | ICD-10-CM | POA: Insufficient documentation

## 2023-05-19 NOTE — Assessment & Plan Note (Signed)
Due to osteoarthritis and reviewed recent x-rays 12/2022 with her today. Rx voltaren gel to use and encouraged to use tylenol up to 1000 mg TID for pain otc.

## 2023-05-20 ENCOUNTER — Encounter: Payer: Self-pay | Admitting: Nurse Practitioner

## 2023-05-20 ENCOUNTER — Inpatient Hospital Stay: Payer: Medicare PPO

## 2023-05-20 ENCOUNTER — Inpatient Hospital Stay: Payer: Medicare PPO | Attending: Nurse Practitioner | Admitting: Nurse Practitioner

## 2023-05-20 ENCOUNTER — Other Ambulatory Visit: Payer: Self-pay

## 2023-05-20 VITALS — BP 159/74 | HR 70 | Temp 98.4°F | Resp 18 | Wt 174.5 lb

## 2023-05-20 DIAGNOSIS — I10 Essential (primary) hypertension: Secondary | ICD-10-CM | POA: Insufficient documentation

## 2023-05-20 DIAGNOSIS — Z90710 Acquired absence of both cervix and uterus: Secondary | ICD-10-CM | POA: Insufficient documentation

## 2023-05-20 DIAGNOSIS — M1711 Unilateral primary osteoarthritis, right knee: Secondary | ICD-10-CM | POA: Insufficient documentation

## 2023-05-20 DIAGNOSIS — Z85038 Personal history of other malignant neoplasm of large intestine: Secondary | ICD-10-CM | POA: Diagnosis not present

## 2023-05-20 DIAGNOSIS — K219 Gastro-esophageal reflux disease without esophagitis: Secondary | ICD-10-CM | POA: Diagnosis not present

## 2023-05-20 DIAGNOSIS — J45909 Unspecified asthma, uncomplicated: Secondary | ICD-10-CM | POA: Insufficient documentation

## 2023-05-20 DIAGNOSIS — C180 Malignant neoplasm of cecum: Secondary | ICD-10-CM

## 2023-05-20 DIAGNOSIS — M19031 Primary osteoarthritis, right wrist: Secondary | ICD-10-CM | POA: Diagnosis not present

## 2023-05-20 DIAGNOSIS — Z9049 Acquired absence of other specified parts of digestive tract: Secondary | ICD-10-CM | POA: Diagnosis not present

## 2023-05-20 LAB — CBC WITH DIFFERENTIAL (CANCER CENTER ONLY)
Abs Immature Granulocytes: 0.01 10*3/uL (ref 0.00–0.07)
Basophils Absolute: 0 10*3/uL (ref 0.0–0.1)
Basophils Relative: 1 %
Eosinophils Absolute: 0.3 10*3/uL (ref 0.0–0.5)
Eosinophils Relative: 7 %
HCT: 38.2 % (ref 36.0–46.0)
Hemoglobin: 12.6 g/dL (ref 12.0–15.0)
Immature Granulocytes: 0 %
Lymphocytes Relative: 31 %
Lymphs Abs: 1.4 10*3/uL (ref 0.7–4.0)
MCH: 32.1 pg (ref 26.0–34.0)
MCHC: 33 g/dL (ref 30.0–36.0)
MCV: 97.4 fL (ref 80.0–100.0)
Monocytes Absolute: 0.4 10*3/uL (ref 0.1–1.0)
Monocytes Relative: 10 %
Neutro Abs: 2.3 10*3/uL (ref 1.7–7.7)
Neutrophils Relative %: 51 %
Platelet Count: 186 10*3/uL (ref 150–400)
RBC: 3.92 MIL/uL (ref 3.87–5.11)
RDW: 13.8 % (ref 11.5–15.5)
WBC Count: 4.5 10*3/uL (ref 4.0–10.5)
nRBC: 0 % (ref 0.0–0.2)

## 2023-05-20 LAB — CMP (CANCER CENTER ONLY)
ALT: 13 U/L (ref 0–44)
AST: 16 U/L (ref 15–41)
Albumin: 4 g/dL (ref 3.5–5.0)
Alkaline Phosphatase: 167 U/L — ABNORMAL HIGH (ref 38–126)
Anion gap: 4 — ABNORMAL LOW (ref 5–15)
BUN: 10 mg/dL (ref 8–23)
CO2: 28 mmol/L (ref 22–32)
Calcium: 9.2 mg/dL (ref 8.9–10.3)
Chloride: 109 mmol/L (ref 98–111)
Creatinine: 0.81 mg/dL (ref 0.44–1.00)
GFR, Estimated: >60 mL/min (ref >60)
Glucose, Bld: 87 mg/dL (ref 70–99)
Potassium: 4 mmol/L (ref 3.5–5.1)
Sodium: 141 mmol/L (ref 135–145)
Total Bilirubin: 0.8 mg/dL (ref 0.3–1.2)
Total Protein: 7 g/dL (ref 6.5–8.1)

## 2023-05-20 LAB — CEA (ACCESS): CEA (CHCC): 3 ng/mL (ref 0.00–5.00)

## 2023-05-20 NOTE — Progress Notes (Signed)
Patient Care Team: Myrlene Broker, MD as PCP - General (Internal Medicine) Christell Constant, MD as PCP - Cardiology (Cardiology) Andria Meuse, MD as Consulting Physician (General Surgery) Malachy Mood, MD as Consulting Physician (Hematology) Iva Boop, MD as Consulting Physician (Gastroenterology) Shon Millet, MD as Consulting Physician (Ophthalmology)   CHIEF COMPLAINT: Follow up right colon cancer   Oncology History Overview Note  Cancer Staging Adenocarcinoma of cecum [facility]) Staging form: Colon and Rectum, AJCC 8th Edition - Clinical: Stage Unknown (cTX, cN0, cM0) - Signed by Malachy Mood, MD on 10/17/2019 - Pathologic stage from 12/15/2019: Stage IIA (pT3, pN0, cM0) - Signed by Malachy Mood, MD on 01/14/2020    Adenocarcinoma of cecum (HCC)  09/26/2019 Imaging   CT AP W Contrast  IMPRESSION: Large cecal mass is noted concerning for malignancy. Colonoscopy is recommended for further evaluation. These results will be called to the ordering clinician or representative by the Radiologist Assistant, and communication documented in the PACS or zVision Dashboard.   Aortic Atherosclerosis (ICD10-I70.0).   09/28/2019 Initial Diagnosis   Adenocarcinoma of cecum (HCC)   10/04/2019 Pathology Results   Cecal mass biopsy showed adenocarcinoma   10/04/2019 Procedure   -Malignant tumor in the cecum. Biopsied. - Diverticulosis in the sigmoid colon. - The examination was otherwise normal   10/04/2019 Miscellaneous   MMR showed loss of MLH1 and PMS2 expression    10/10/2019 Imaging   CT chest W contrast  IMPRESSION: 1. Small volume nonocclusive pulmonary embolus identified in segmental left upper lobe pulmonary artery and subsegmental pulmonary artery to the left lower lobe. Left lower lobe embolus has a string like configuration suggesting that these findings may well be subacute to chronic. Probable trace nonobstructive embolus in a subsegmental artery to  the right lower lobe. 2. No evidence for metastatic disease in the thorax. 3.  Aortic Atherosclerois (ICD10-170.0)   10/17/2019 Cancer Staging   Staging form: Colon and Rectum, AJCC 8th Edition - Clinical: Stage Unknown (cTX, cN0, cM0) - Signed by Malachy Mood, MD on 10/17/2019   12/15/2019 Surgery   LAPAROSCOPIC RIGHT HEMI COLECTOMY WITH LYSIS OF ADHESIONS by Dr. Cliffton Asters     12/15/2019 Pathology Results   FINAL MICROSCOPIC DIAGNOSIS:   A. COLON, RIGHT, RESECTION:  -  Adenocarcinoma, moderately differentiated, 7 cm  -  Carcinoma invades through muscularis propria into pericolorectal  tissue  -  No carcinoma identified in nineteen (0/19)  -  Margins uninvolved by carcinoma  -  Appendix uninvolved by carcinoma  -  Tubular adenoma, incidental (0.3 cm)  -  See oncology table and comment below    12/15/2019 Cancer Staging   Staging form: Colon and Rectum, AJCC 8th Edition - Pathologic stage from 12/15/2019: Stage IIA (pT3, pN0, cM0) - Signed by Malachy Mood, MD on 01/14/2020   11/15/2020 Imaging   CT CAP  IMPRESSION: No evidence of recurrent or metastatic carcinoma within the chest, abdomen, or pelvis.   Colonic diverticulosis. No radiographic evidence of diverticulitis.   Aortic and coronary atherosclerotic calcification.   03/18/2021 Procedure   Colonoscopy, surveillance  Impression: - Diverticulosis in the sigmoid colon. - Three diminutive polyps in the rectum and in the sigmoid colon, removed with a cold biopsy forceps. Resected and retrieved. - Patent end-to-side ileo-colonic anastomosis, characterized by healthy appearing mucosa.   03/18/2021 Pathology Results   Diagnosis 1. Surgical [P], colon, sigmoid, polyp (1) - POLYPOID, INFLAMED GRANULATION TISSUE. - NO ADENOMATOUS CHANGE OR CARCINOMA. 2. Surgical [P], colon, rectum,  polyp (2) - HYPERPLASTIC POLYP (TWO) - NO ADENOMATOUS CHANGE OR CARCINOMA.   11/13/2021 Imaging   EXAM: CT CHEST, ABDOMEN, AND PELVIS WITH  CONTRAST  IMPRESSION: 1. No evidence of recurrence or metastatic disease within the chest, abdomen, or pelvis. 2. Colonic diverticulosis without findings of acute diverticulitis. 3. Three-vessel coronary artery disease. Please note that although the presence of coronary artery calcium documents the presence of coronary artery disease, the severity of this disease and any potential stenosis cannot be assessed on this non-gated CT examination. Assessment for potential risk factor modification, dietary therapy or pharmacologic therapy may be warranted, if clinically indicated 4.  Aortic Atherosclerosis (ICD10-I70.0).      CURRENT THERAPY: Surveillance   INTERVAL HISTORY Jamie [name] returns for follow up as scheduled. Last seen by Dr. Mosetta Putt 05/19/22.  Doing well in the interim without significant changes in her health.  She continues to have asthma/bronchitis flares and arthritic pain in the right wrist and knee, ambulates at home with a walker/cane, no fall.  Appetite and weight are normal, denies abdominal pain.  She has occasional need for Mylanta or stool softener which has been the same since surgery. Denies nausea/vomiting, unintentional weight loss, bloody stools, or any other new/specific complaints.  ROS  All other systems reviewed and negative  Past Medical History:  Diagnosis Date   Adenocarcinoma of cecum (HCC) 09/28/2019   ANEMIA, IRON DEFICIENCY 09/10/2007   Anxiety    Arthritis    Asthma    Blood transfusion without reported diagnosis    Cataract 2008   bilateral cataract extraction   CATARACT EXTRACTION, HX OF 09/10/2007   DYSPEPSIA, HX OF 09/10/2007   GERD (gastroesophageal reflux disease)    HAIR LOSS 03/21/2009   HAY FEVER 09/10/2007   HEARING LOSS, SENSORINEURAL 09/10/2007   HYPERLIPIDEMIA 09/10/2007   HYPERTENSION 09/10/2007   LOW BACK PAIN 05/10/2008   Pain in joint, hand 03/01/2008   PEPTIC ULCER DISEASE 09/10/2007   POLYP, COLON 09/10/2007   POLYPECTOMY, HX OF  09/10/2007   Pulmonary embolism (HCC) 1- 2 non-occlusive 10/10/2019   SHINGLES 08/13/2009   SHOULDER PAIN, LEFT 02/12/2008   TAH/BSO, HX OF 09/10/2007   Tuberculosis    PATIENT DENIES AT PHONE INTERVIEW ON 12/12/2019     Past Surgical History:  Procedure Laterality Date   ABDOMINAL HYSTERECTOMY     BREAST SURGERY     breast biopsy-benign   CATARACT EXTRACTION     COLONOSCOPY     EYE SURGERY     bilateral cataract with lens implant   IRRIGATION AND DEBRIDEMENT SEBACEOUS CYST     LAPAROSCOPIC RIGHT HEMI COLECTOMY Right 12/15/2019   Procedure: LAPAROSCOPIC RIGHT HEMI COLECTOMY WITH LYSIS OF ADHESIONS;  Surgeon: Andria Meuse, MD;  Location: [facility];  Service: General;  Laterality: Right;   TOTAL ABDOMINAL HYSTERECTOMY W/ BILATERAL SALPINGOOPHORECTOMY       Outpatient Encounter Medications as of 05/20/2023  Medication Sig   acetaminophen (TYLENOL) 500 MG tablet Take 500-1,000 mg by mouth every 6 (six) hours as needed (for pain.).   albuterol (VENTOLIN HFA) 108 (90 Base) MCG/ACT inhaler INHALE 2 PUFFS BY MOUTH EVERY 4 HOURS AS NEEDED FOR WHEEZE OR FOR SHORTNESS OF BREATH   ALPRAZolam (XANAX) 0.5 MG tablet TAKE 1 TABLET BY MOUTH EVERY DAY AS NEEDED FOR ANXIETY   alum & mag hydroxide-simeth (MAALOX/MYLANTA) 200-200-20 MG/5ML suspension Take 15 mLs by mouth every 6 (six) hours as needed for indigestion or heartburn.   amLODipine (NORVASC) 5 MG tablet TAKE 1  TABLET BY MOUTH EVERY DAY   atorvastatin (LIPITOR) 40 MG tablet Take 1 tablet (40 mg total) by mouth daily.   benzonatate (TESSALON) 200 MG capsule TAKE 1 CAPSULE (200 MG TOTAL) BY MOUTH 3 (THREE) TIMES DAILY AS NEEDED FOR COUGH.   betamethasone dipropionate 0.05 % cream APPLY TO AFFECTED AREA TWICE A DAY   BREO ELLIPTA 100-25 MCG/ACT AEPB INHALE 1 PUFF BY MOUTH EVERY DAY   diclofenac Sodium (VOLTAREN) 1 % GEL Apply 2 g topically 4 (four) times daily.   ezetimibe (ZETIA) 10 MG tablet Take 1 tablet (10 mg total) by mouth daily.    famotidine (PEPCID) 20 MG tablet TAKE 1 TABLET BY MOUTH TWICE A DAY   furosemide (LASIX) 40 MG tablet TAKE 1 TABLET BY MOUTH EVERY DAY   montelukast (SINGULAIR) 10 MG tablet TAKE 1 TABLET BY MOUTH EVERYDAY AT BEDTIME   nystatin cream (MYCOSTATIN) Apply 1 Application topically 2 (two) times daily. (Patient taking differently: Apply 1 Application topically as needed for dry skin.)   olmesartan (BENICAR) 40 MG tablet TAKE 1 TABLET BY MOUTH EVERY DAY   ondansetron (ZOFRAN-ODT) 4 MG disintegrating tablet TAKE 1 TABLET BY MOUTH EVERY 8 HOURS AS NEEDED FOR NAUSEA AND VOMITING   potassium chloride (KLOR-CON) 10 MEQ tablet TAKE 1 TABLET BY MOUTH EVERY DAY   sennosides-docusate sodium (SENOKOT-S) 8.6-50 MG tablet Take 1 tablet by mouth daily as needed for constipation.   triamcinolone cream (KENALOG) 0.1 % APPLY TO AFFECTED AREA TOPICALLY TWICE A DAY AS NEEDED FOR SKIN IRRITATION/RASH   No facility-administered encounter medications on file as of 05/20/2023.     Today's Vitals   05/20/23 1007 05/20/23 1012  BP: (!) 159/74   Pulse: 70   Resp: 18   Temp: 98.4 F (36.9 C)   SpO2: 99%   Weight: 174 lb 8 oz (79.2 kg)   PainSc:  0-No pain   Body mass index is 30.91 kg/m.   PHYSICAL EXAM GENERAL:alert, no distress and comfortable SKIN: no rash  EYES: sclera clear NECK: without mass LYMPH:  no palpable cervical or supraclavicular lymphadenopathy  LUNGS: Expiratory wheezes bilaterally.  Normal breathing effort HEART: regular rate & rhythm, no lower extremity edema ABDOMEN: abdomen soft, non-tender and normal bowel sounds NEURO: alert & oriented x 3 with fluent speech, no focal sensory deficits Exam performed in wheelchair  CBC    Component Value Date/Time   WBC 4.5 05/20/2023 0949   WBC 5.8 02/09/2022 1551   RBC 3.92 05/20/2023 0949   HGB 12.6 05/20/2023 0949   HCT 38.2 05/20/2023 0949   PLT 186 05/20/2023 0949   MCV 97.4 05/20/2023 0949   MCH 32.1 05/20/2023 0949   MCHC 33.0 05/20/2023  0949   RDW 13.8 05/20/2023 0949   LYMPHSABS 1.4 05/20/2023 0949   MONOABS 0.4 05/20/2023 0949   EOSABS 0.3 05/20/2023 0949   BASOSABS 0.0 05/20/2023 0949     CMP     Component Value Date/Time   NA 141 05/20/2023 0949   NA 144 07/10/2022 1004   K 4.0 05/20/2023 0949   CL 109 05/20/2023 0949   CO2 28 05/20/2023 0949   GLUCOSE 87 05/20/2023 0949   BUN 10 05/20/2023 0949   BUN 7 (L) 07/10/2022 1004   CREATININE 0.81 05/20/2023 0949   CALCIUM 9.2 05/20/2023 0949   PROT 7.0 05/20/2023 0949   ALBUMIN 4.0 05/20/2023 0949   AST 16 05/20/2023 0949   ALT 13 05/20/2023 0949   ALKPHOS 167 (H) 05/20/2023 9604  BILITOT 0.8 05/20/2023 0949   GFRNONAA >60 05/20/2023 0949   GFRAA >60 04/13/2020 0916     ASSESSMENT & PLAN:Jamie [name] is a 89 y.o. female with    1. Right colon cancer, pT3N0M0, stage IIA, MSI-H -Diagnosed 10/2019, s/p curative right hemicolectomy with Dr. Cliffton Asters on 12/15/19 -MMR show MLH1 and PMS2 loss, and MLH hypermethylation positive, supports sporadic cancer, not Lynch syndrome.   -Adjuvant chemotherapy was not recommended.  -Surveillance colonoscopy on 03/18/21 was negative. Recall not indicated due to age. -surveillance CT CAP 11/13/21 showed NED. -Jamie [name] is clinically doing well. Exam is benign, labs are stable.  Will follow-up the pending CEA.  No clinical concern for recurrence.  -She will complete 5-year surveillance plan with next year's visit    2. HTN, GERD, Arthritis, asthma/bronchitis -Continue to f/u with PCP -She has arthritis mainly in R knee and wrist -She ambulates with walker and cane, no fall.      PLAN: -CBC/CMP stable, we will follow-up pending CEA -Continue colon cancer surveillance -Follow-up in 1 year for last visit    All questions were answered. The patient knows to call the clinic with any problems, questions or concerns. No barriers to learning were detected.  Santiago Glad, NP-C 05/20/2023

## 2023-06-01 ENCOUNTER — Ambulatory Visit: Payer: Medicare PPO

## 2023-06-01 DIAGNOSIS — Z0000 Encounter for general adult medical examination without abnormal findings: Secondary | ICD-10-CM | POA: Diagnosis not present

## 2023-06-01 NOTE — Patient Instructions (Addendum)
[name] , Thank you for taking time to come for your Medicare Wellness Visit. I appreciate your ongoing commitment to your health goals. Please review the following plan we discussed and let me know if I can assist you in the future.   These are the goals we discussed:  Goals      Continue to stay independent, active around the house and reading my books.     Family to eat at dinner table     Patient Stated     Stay as healthy and as independent as possible.         This is a list of the screening recommended for you and due dates:  Health Maintenance  Topic Date Due   Zoster (Shingles) Vaccine (1 of 2) Never done   DTaP/Tdap/Td vaccine (2 - Tdap) 05/17/2020   COVID-19 Vaccine (5 - 2023-24 season) 06/03/2023*   Colon Cancer Screening  02/24/2024*   Mammogram  06/12/2023   Flu Shot  07/02/2023   Medicare Annual Wellness Visit  05/31/2024   Pneumonia Vaccine  Completed   DEXA scan (bone density measurement)  Completed   HPV Vaccine  Aged Out  *Topic was postponed. The date shown is not the original due date.    Health Maintenance, Female Adopting a healthy lifestyle and getting preventive care are important in promoting health and wellness. Ask your health care provider about: The right schedule for you to have regular tests and exams. Things you can do on your own to prevent diseases and keep yourself healthy. What should I know about diet, weight, and exercise? Eat a healthy diet  Eat a diet that includes plenty of vegetables, fruits, low-fat dairy products, and lean protein. Do not eat a lot of foods that are high in solid fats, added sugars, or sodium. Maintain a healthy weight Body mass index (BMI) is used to identify weight problems. It estimates body fat based on height and weight. Your health care provider can help determine your BMI and help you achieve or maintain a healthy weight. Get regular exercise Get regular exercise. This is one of the most important  things you can do for your health. Most adults should: Exercise for at least 150 minutes each week. The exercise should increase your heart rate and make you sweat (moderate-intensity exercise). Do strengthening exercises at least twice a week. This is in addition to the moderate-intensity exercise. Spend less time sitting. Even light physical activity can be beneficial. Watch cholesterol and blood lipids Have your blood tested for lipids and cholesterol at 87 years of age, then have this test every 5 years. Have your cholesterol levels checked more often if: Your lipid or cholesterol levels are high. You are older than 87 years of age. You are at high risk for heart disease. What should I know about cancer screening? Depending on your health history and family history, you may need to have cancer screening at various ages. This may include screening for: Breast cancer. Cervical cancer. Colorectal cancer. Skin cancer. Lung cancer. What should I know about heart disease, diabetes, and high blood pressure? Blood pressure and heart disease High blood pressure causes heart disease and increases the risk of stroke. This is more likely to develop in people who have high blood pressure readings or are overweight. Have your blood pressure checked: Every 3-5 years if you are 53-33 years of age. Every year if you are 17 years old or older. Diabetes Have regular diabetes screenings. This checks your  fasting blood sugar level. Have the screening done: Once every three years after age 70 if you are at a normal weight and have a low risk for diabetes. More often and at a younger age if you are overweight or have a high risk for diabetes. What should I know about preventing infection? Hepatitis B If you have a higher risk for hepatitis B, you should be screened for this virus. Talk with your health care provider to find out if you are at risk for hepatitis B infection. Hepatitis C Testing is  recommended for: Everyone born from [birth year] through 1963. Anyone with known risk factors for hepatitis C. Sexually transmitted infections (STIs) Get screened for STIs, including gonorrhea and chlamydia, if: You are sexually active and are younger than 87 years of age. You are older than 87 years of age and your health care provider tells you that you are at risk for this type of infection. Your sexual activity has changed since you were last screened, and you are at increased risk for chlamydia or gonorrhea. Ask your health care provider if you are at risk. Ask your health care provider about whether you are at high risk for HIV. Your health care provider may recommend a prescription medicine to help prevent HIV infection. If you choose to take medicine to prevent HIV, you should first get tested for HIV. You should then be tested every 3 months for as long as you are taking the medicine. Pregnancy If you are about to stop having your period (premenopausal) and you may become pregnant, seek counseling before you get pregnant. Take 400 to 800 micrograms (mcg) of folic acid every day if you become pregnant. Ask for birth control (contraception) if you want to prevent pregnancy. Osteoporosis and menopause Osteoporosis is a disease in which the bones lose minerals and strength with aging. This can result in bone fractures. If you are 110 years old or older, or if you are at risk for osteoporosis and fractures, ask your health care provider if you should: Be screened for bone loss. Take a calcium or vitamin D supplement to lower your risk of fractures. Be given hormone replacement therapy (HRT) to treat symptoms of menopause. Follow these instructions at home: Alcohol use Do not drink alcohol if: Your health care provider tells you not to drink. You are pregnant, may be pregnant, or are planning to become pregnant. If you drink alcohol: Limit how much you have to: 0-1 drink a day. Know how much  alcohol is in your drink. In the [place] one drink equals one 12 oz bottle of beer (355 mL), one 5 oz glass of wine (148 mL), or one 1 oz glass of hard liquor (44 mL). Lifestyle Do not use any products that contain nicotine or tobacco. These products include cigarettes, chewing tobacco, and vaping devices, such as e-cigarettes. If you need help quitting, ask your health care provider. Do not use street drugs. Do not share needles. Ask your health care provider for help if you need support or information about quitting drugs. General instructions Schedule regular health, dental, and eye exams. Stay current with your vaccines. Tell your health care provider if: You often feel depressed. You have ever been abused or do not feel safe at home. Summary Adopting a healthy lifestyle and getting preventive care are important in promoting health and wellness. Follow your health care provider's instructions about healthy diet, exercising, and getting tested or screened for diseases. Follow your health care provider's instructions on  monitoring your cholesterol and blood pressure. This information is not intended to replace advice given to you by your health care provider. Make sure you discuss any questions you have with your health care provider. Document Revised: 04/08/2021 Document Reviewed: 04/08/2021 Elsevier Patient Education  2024 [employer].

## 2023-06-01 NOTE — Progress Notes (Cosign Needed)
Subjective:   Jamie [name] is a 89 y.o. female who presents for Medicare Annual (Subsequent) preventive examination.  Visit Complete: Virtual  I connected with  Shireen Quan Bellizzi on 06/01/23 by a audio enabled telemedicine application and verified that I am speaking with the correct person using two identifiers.  Patient Location: Home  Provider Location: Home Office  I discussed the limitations of evaluation and management by telemedicine. The patient expressed understanding and agreed to proceed.  Patient Medicare AWV questionnaire was completed by the patient on 06/01/2023; I have confirmed that all information answered by patient is correct and no changes since this date.  Cardiac Risk Factors include: advanced age (>71men, >13 women);hypertension;dyslipidemia     Objective:    Today's Vitals   06/01/23 1046  PainSc: 0-No pain   There is no height or weight on file to calculate BMI.     06/01/2023   10:48 AM 12/17/2022    9:19 AM 03/11/2022   11:17 AM 02/28/2021   11:03 AM 10/17/2020   10:37 AM 12/15/2019    9:22 AM 12/12/2019    8:25 AM  Advanced Directives  Does Patient Have a Medical Advance Directive? No No No No No No No  Would patient like information on creating a medical advance directive? No - Patient declined Yes (MAU/Ambulatory/Procedural Areas - Information given) No - Patient declined No - Patient declined  No - Patient declined No - Patient declined    Current Medications (verified) Outpatient Encounter Medications as of 06/01/2023  Medication Sig   acetaminophen (TYLENOL) 500 MG tablet Take 500-1,000 mg by mouth every 6 (six) hours as needed (for pain.).   albuterol (VENTOLIN HFA) 108 (90 Base) MCG/ACT inhaler INHALE 2 PUFFS BY MOUTH EVERY 4 HOURS AS NEEDED FOR WHEEZE OR FOR SHORTNESS OF BREATH   ALPRAZolam (XANAX) 0.5 MG tablet TAKE 1 TABLET BY MOUTH EVERY DAY AS NEEDED FOR ANXIETY   alum & mag hydroxide-simeth (MAALOX/MYLANTA) 200-200-20 MG/5ML  suspension Take 15 mLs by mouth every 6 (six) hours as needed for indigestion or heartburn.   amLODipine (NORVASC) 5 MG tablet TAKE 1 TABLET BY MOUTH EVERY DAY   atorvastatin (LIPITOR) 40 MG tablet Take 1 tablet (40 mg total) by mouth daily.   benzonatate (TESSALON) 200 MG capsule TAKE 1 CAPSULE (200 MG TOTAL) BY MOUTH 3 (THREE) TIMES DAILY AS NEEDED FOR COUGH.   betamethasone dipropionate 0.05 % cream APPLY TO AFFECTED AREA TWICE A DAY   BREO ELLIPTA 100-25 MCG/ACT AEPB INHALE 1 PUFF BY MOUTH EVERY DAY   diclofenac Sodium (VOLTAREN) 1 % GEL Apply 2 g topically 4 (four) times daily.   ezetimibe (ZETIA) 10 MG tablet Take 1 tablet (10 mg total) by mouth daily.   famotidine (PEPCID) 20 MG tablet TAKE 1 TABLET BY MOUTH TWICE A DAY   furosemide (LASIX) 40 MG tablet TAKE 1 TABLET BY MOUTH EVERY DAY   montelukast (SINGULAIR) 10 MG tablet TAKE 1 TABLET BY MOUTH EVERYDAY AT BEDTIME   nystatin cream (MYCOSTATIN) Apply 1 Application topically 2 (two) times daily. (Patient taking differently: Apply 1 Application topically as needed for dry skin.)   olmesartan (BENICAR) 40 MG tablet TAKE 1 TABLET BY MOUTH EVERY DAY   ondansetron (ZOFRAN-ODT) 4 MG disintegrating tablet TAKE 1 TABLET BY MOUTH EVERY 8 HOURS AS NEEDED FOR NAUSEA AND VOMITING   potassium chloride (KLOR-CON) 10 MEQ tablet TAKE 1 TABLET BY MOUTH EVERY DAY   sennosides-docusate sodium (SENOKOT-S) 8.6-50 MG tablet Take 1 tablet by  mouth daily as needed for constipation.   triamcinolone cream (KENALOG) 0.1 % APPLY TO AFFECTED AREA TOPICALLY TWICE A DAY AS NEEDED FOR SKIN IRRITATION/RASH   No facility-administered encounter medications on file as of 06/01/2023.    Allergies (verified) Aspirin and Latex   History: Past Medical History:  Diagnosis Date   Adenocarcinoma of cecum (HCC) 09/28/2019   ANEMIA, IRON DEFICIENCY 09/10/2007   Anxiety    Arthritis    Asthma    Blood transfusion without reported diagnosis    Cataract 2008   bilateral  cataract extraction   CATARACT EXTRACTION, HX OF 09/10/2007   DYSPEPSIA, HX OF 09/10/2007   GERD (gastroesophageal reflux disease)    HAIR LOSS 03/21/2009   HAY FEVER 09/10/2007   HEARING LOSS, SENSORINEURAL 09/10/2007   HYPERLIPIDEMIA 09/10/2007   HYPERTENSION 09/10/2007   LOW BACK PAIN 05/10/2008   Pain in joint, hand 03/01/2008   PEPTIC ULCER DISEASE 09/10/2007   POLYP, COLON 09/10/2007   POLYPECTOMY, HX OF 09/10/2007   Pulmonary embolism (HCC) 1- 2 non-occlusive 10/10/2019   SHINGLES 08/13/2009   SHOULDER PAIN, LEFT 02/12/2008   TAH/BSO, HX OF 09/10/2007   Tuberculosis    PATIENT DENIES AT PHONE INTERVIEW ON 12/12/2019   Past Surgical History:  Procedure Laterality Date   ABDOMINAL HYSTERECTOMY     BREAST SURGERY     breast biopsy-benign   CATARACT EXTRACTION     COLONOSCOPY     EYE SURGERY     bilateral cataract with lens implant   IRRIGATION AND DEBRIDEMENT SEBACEOUS CYST     LAPAROSCOPIC RIGHT HEMI COLECTOMY Right 12/15/2019   Procedure: LAPAROSCOPIC RIGHT HEMI COLECTOMY WITH LYSIS OF ADHESIONS;  Surgeon: Andria Meuse, MD;  Location: [facility];  Service: General;  Laterality: Right;   TOTAL ABDOMINAL HYSTERECTOMY W/ BILATERAL SALPINGOOPHORECTOMY     Family History  Problem Relation Age of Onset   Hypertension Mother    Coronary artery disease Mother    Heart disease Mother        CHF   Cancer Son 85       prostate cancer    Cancer Son 32       prostate cancer   Diabetes Neg Hx    Social History   Socioeconomic History   Marital status: Widowed    Spouse name: Not on file   Number of children: 4   Years of education: Not on file   Highest education level: Not on file  Occupational History   Occupation: retired  Tobacco Use   Smoking status: Former    Packs/day: 0.20    Years: 25.00    Additional pack years: 0.00    Total pack years: 5.00    Types: Cigarettes    Quit date: 12/01/1989    Years since quitting: 33.5   Smokeless tobacco: Never   Tobacco  comments:    smoked a long time ago; Quit 90 or 91/ number is approximate  Vaping Use   Vaping Use: Never used  Substance and Sexual Activity   Alcohol use: No    Alcohol/week: 0.0 standard drinks of alcohol   Drug use: No   Sexual activity: Not Currently  Other Topics Concern   Not on file  Social History Narrative   3 sons, 53,59, 39 and 1 daughter 84, 4 grandchildren   SO with multiple medical problems   Social Determinants of Health   Financial Resource Strain: Low Risk  (06/01/2023)   Overall Financial Resource Strain (CARDIA)  Difficulty of Paying Living Expenses: Not very hard  Food Insecurity: No Food Insecurity (06/01/2023)   Hunger Vital Sign    Worried About Running Out of Food in the Last Year: Never true    Ran Out of Food in the Last Year: Never true  Transportation Needs: No Transportation Needs (03/11/2022)   PRAPARE - Administrator, Civil Service (Medical): No    Lack of Transportation (Non-Medical): No  Physical Activity: Inactive (06/01/2023)   Exercise Vital Sign    Days of Exercise per Week: 0 days    Minutes of Exercise per Session: 0 min  Stress: No Stress Concern Present (06/01/2023)   [employer] of Occupational Health - Occupational Stress Questionnaire    Feeling of Stress : Not at all  Social Connections: Moderately Integrated (06/01/2023)   Social Connection and Isolation Panel [NHANES]    Frequency of Communication with Friends and Family: More than three times a week    Frequency of Social Gatherings with Friends and Family: More than three times a week    Attends Religious Services: More than 4 times per year    Active Member of [employer] or Organizations: Yes    Attends Banker Meetings: Not on file    Marital Status: Widowed    Tobacco Counseling Counseling given: Not Answered Tobacco comments: smoked a long time ago; Quit 90 or 91/ number is approximate   Clinical Intake:  Pre-visit preparation completed:  Yes  Pain : No/denies pain Pain Score: 0-No pain     Diabetes: No  How often do you need to have someone help you when you read instructions, pamphlets, or other written materials from your doctor or pharmacy?: 1 - Never What is the last grade level you completed in school?: high school  Interpreter Needed?: No      Activities of Daily Living    06/01/2023   10:46 AM  In your present state of health, do you have any difficulty performing the following activities:  Hearing? 0  Vision? 0  Difficulty concentrating or making decisions? 0  Walking or climbing stairs? 1  Dressing or bathing? 0  Doing errands, shopping? 0  Preparing Food and eating ? N  Using the Toilet? N  In the past six months, have you accidently leaked urine? N  Do you have problems with loss of bowel control? N  Managing your Medications? N  Managing your Finances? N  Housekeeping or managing your Housekeeping? N    Patient Care Team: Myrlene Broker, MD as PCP - General (Internal Medicine) Christell Constant, MD as PCP - Cardiology (Cardiology) Andria Meuse, MD as Consulting Physician (General Surgery) Malachy Mood, MD as Consulting Physician (Hematology) Iva Boop, MD as Consulting Physician (Gastroenterology) Shon Millet, MD as Consulting Physician (Ophthalmology)  Indicate any recent Medical Services you may have received from other than [facility] providers in the past year (date may be approximate).     Assessment:   This is a routine wellness examination for Pony.  Hearing/Vision screen No results found.  Dietary issues and exercise activities discussed:     Goals Addressed             This Visit's Progress    Family to eat at dinner table        Depression Screen    06/01/2023   10:51 AM 06/01/2023   10:49 AM 05/18/2023    2:08 PM 02/24/2023    1:45 PM 06/20/2022  8:49 AM 03/11/2022   11:26 AM 02/04/2022   10:21 AM  PHQ 2/9 Scores  PHQ - 2 Score 0 0  0 0 0 0 0  PHQ- 9 Score 0 0 0 0 0      Fall Risk    06/01/2023   10:48 AM 05/18/2023    2:08 PM 02/24/2023    1:44 PM 06/20/2022    8:49 AM 03/11/2022   11:18 AM  Fall Risk   Falls in the past year? 0 0 0 0 0  Number falls in past yr: 0 0 0 0 0  Injury with Fall? 0 0 0 0 0  Risk for fall due to : Impaired balance/gait;Impaired mobility    No Fall Risks  Follow up Falls prevention discussed Falls evaluation completed Falls evaluation completed  Falls evaluation completed    MEDICARE RISK AT HOME:  Medicare Risk at Home - 06/01/23 1049     Any stairs in or around the home? No    If so, are there any without handrails? No    Home free of loose throw rugs in walkways, pet beds, electrical cords, etc? Yes    Adequate lighting in your home to reduce risk of falls? Yes    Life alert? No    Use of a cane, walker or w/c? Yes    Grab bars in the bathroom? Yes    Shower chair or bench in shower? Yes    Elevated toilet seat or a handicapped toilet? No             TIMED UP AND GO:  Was the test performed?  No    Cognitive Function:    10/12/2018    9:34 AM  MMSE - Mini Mental State Exam  Orientation to time 5  Orientation to Place 5  Registration 3  Attention/ Calculation 3  Recall 3  Language- name 2 objects 2  Language- repeat 1  Language- follow 3 step command 3  Language- read & follow direction 1  Write a sentence 1  Copy design 1  Total score 28        06/01/2023   10:52 AM 03/11/2022   11:29 AM 10/14/2019    9:42 AM  6CIT Screen  What Year? 0 points 0 points 0 points  What month? 0 points 0 points 0 points  What time? 0 points 0 points 0 points  Count back from 20 2 points 0 points 0 points  Months in reverse 4 points 0 points 0 points  Repeat phrase 6 points 0 points 2 points  Total Score 12 points 0 points 2 points    Immunizations Immunization History  Administered Date(s) Administered   Fluad Quad(high Dose 65+) 09/15/2019, 10/12/2020, 08/09/2021,  10/14/2022   Influenza Split 09/21/2012   Influenza Whole 11/08/2007, 09/12/2008, 09/11/2009, 09/24/2010   Influenza, High Dose Seasonal PF 10/11/2013, 08/15/2016, 10/06/2017, 09/01/2018   Influenza,inj,Quad PF,6+ Mos 08/08/2014, 08/14/2015   PFIZER(Purple Top)SARS-COV-2 Vaccination 01/27/2020, 02/22/2020, 09/29/2020   Pfizer Covid-19 Vaccine Bivalent Booster 35yrs & up 12/24/2021   Pneumococcal Conjugate-13 09/22/2013   Pneumococcal Polysaccharide-23 03/01/2008, 12/18/2019   Td 05/17/2010    TDAP status: Due, Education has been provided regarding the importance of this vaccine. Advised may receive this vaccine at local pharmacy or Health Dept. Aware to provide a copy of the vaccination record if obtained from local pharmacy or Health Dept. Verbalized acceptance and understanding.  Flu Vaccine status: Up to date  Pneumococcal vaccine status: Up to date  Covid-19 vaccine status: Completed vaccines  Qualifies for Shingles Vaccine? Yes   Zostavax completed No   Shingrix Completed?: No.    Education has been provided regarding the importance of this vaccine. Patient has been advised to call insurance company to determine out of pocket expense if they have not yet received this vaccine. Advised may also receive vaccine at local pharmacy or Health Dept. Verbalized acceptance and understanding.  Screening Tests Health Maintenance  Topic Date Due   Zoster Vaccines- Shingrix (1 of 2) Never done   DTaP/Tdap/Td (2 - Tdap) 05/17/2020   COVID-19 Vaccine (5 - 2023-24 season) 06/03/2023 (Originally 08/01/2022)   Colonoscopy  02/24/2024 (Originally 03/18/2022)   MAMMOGRAM  06/12/2023   INFLUENZA VACCINE  07/02/2023   Medicare Annual Wellness (AWV)  05/31/2024   Pneumonia Vaccine 106+ Years old  Completed   DEXA SCAN  Completed   HPV VACCINES  Aged Out    Health Maintenance  Health Maintenance Due  Topic Date Due   Zoster Vaccines- Shingrix (1 of 2) Never done   DTaP/Tdap/Td (2 - Tdap)  05/17/2020    Colorectal cancer screening: No longer required.   Mammogram status: Completed 06/12/2023. Repeat every year    Lung Cancer Screening: (Low Dose CT Chest recommended if Age 6-80 years, 20 pack-year currently smoking OR have quit w/in 15years.) does not qualify.   Lung Cancer Screening Referral: na  Additional Screening:  Hepatitis C Screening: does not qualify; Completed   Vision Screening: Recommended annual ophthalmology exams for early detection of glaucoma and other disorders of the eye. Is the patient up to date with their annual eye exam?  Yes  Who is the provider or what is the name of the office in which the patient attends annual eye exams? [facility] If pt is not established with a provider, would they like to be referred to a provider to establish care?  Established  .   Dental Screening: Recommended annual dental exams for proper oral hygiene  Diabetic Foot Exam:   Community Resource Referral / Chronic Care Management: CRR required this visit?  No   CCM required this visit?  No     Plan:     I have personally reviewed and noted the following in the patient's chart:   Medical and social history Use of alcohol, tobacco or illicit drugs  Current medications and supplements including opioid prescriptions. Patient is not currently taking opioid prescriptions. Functional ability and status Nutritional status Physical activity Advanced directives List of other physicians Hospitalizations, surgeries, and ER visits in previous 12 months Vitals Screenings to include cognitive, depression, and falls Referrals and appointments  In addition, I have reviewed and discussed with patient certain preventive protocols, quality metrics, and best practice recommendations. A written personalized care plan for preventive services as well as general preventive health recommendations were provided to patient.     Delana Meyer   06/01/2023   After Visit Summary:  (MyChart) Due to this being a telephonic visit, the after visit summary with patients personalized plan was offered to patient via MyChart   Nurse Notes: none

## 2023-06-17 ENCOUNTER — Encounter: Payer: Self-pay | Admitting: Internal Medicine

## 2023-06-17 DIAGNOSIS — Z1231 Encounter for screening mammogram for malignant neoplasm of breast: Secondary | ICD-10-CM | POA: Diagnosis not present

## 2023-06-17 LAB — HM MAMMOGRAPHY

## 2023-06-17 NOTE — Progress Notes (Signed)
Subjective:   Jamie [name] is a 89 y.o. female who presents for Medicare Annual (Subsequent) preventive examination.  Visit Complete: Virtual  I connected with  Jamie [name] on 06/01/2023 by a audio enabled telemedicine application and verified that I am speaking with the correct person using two identifiers.  Patient Location: Home  Provider Location: Home Office  I discussed the limitations of evaluation and management by telemedicine. The patient expressed understanding and agreed to proceed.  Patient Medicare AWV questionnaire was completed by the patient on 06/01/2023; I have confirmed that all information answered by patient is correct and no changes since this date.  Cardiac Risk Factors include: advanced age (>61men, >35 women);hypertension;dyslipidemia     Objective:    Today's Vitals   06/01/23 1046  PainSc: 0-No pain   There is no height or weight on file to calculate BMI.     06/01/2023   10:48 AM 12/17/2022    9:19 AM 03/11/2022   11:17 AM 02/28/2021   11:03 AM 10/17/2020   10:37 AM 12/15/2019    9:22 AM 12/12/2019    8:25 AM  Advanced Directives  Does Patient Have a Medical Advance Directive? No No No No No No No  Would patient like information on creating a medical advance directive? No - Patient declined Yes (MAU/Ambulatory/Procedural Areas - Information given) No - Patient declined No - Patient declined  No - Patient declined No - Patient declined    Current Medications (verified) Outpatient Encounter Medications as of 06/01/2023  Medication Sig   acetaminophen (TYLENOL) 500 MG tablet Take 500-1,000 mg by mouth every 6 (six) hours as needed (for pain.).   albuterol (VENTOLIN HFA) 108 (90 Base) MCG/ACT inhaler INHALE 2 PUFFS BY MOUTH EVERY 4 HOURS AS NEEDED FOR WHEEZE OR FOR SHORTNESS OF BREATH   ALPRAZolam (XANAX) 0.5 MG tablet TAKE 1 TABLET BY MOUTH EVERY DAY AS NEEDED FOR ANXIETY   alum & mag hydroxide-simeth (MAALOX/MYLANTA) 200-200-20 MG/5ML  suspension Take 15 mLs by mouth every 6 (six) hours as needed for indigestion or heartburn.   amLODipine (NORVASC) 5 MG tablet TAKE 1 TABLET BY MOUTH EVERY DAY   atorvastatin (LIPITOR) 40 MG tablet Take 1 tablet (40 mg total) by mouth daily.   benzonatate (TESSALON) 200 MG capsule TAKE 1 CAPSULE (200 MG TOTAL) BY MOUTH 3 (THREE) TIMES DAILY AS NEEDED FOR COUGH.   betamethasone dipropionate 0.05 % cream APPLY TO AFFECTED AREA TWICE A DAY   BREO ELLIPTA 100-25 MCG/ACT AEPB INHALE 1 PUFF BY MOUTH EVERY DAY   diclofenac Sodium (VOLTAREN) 1 % GEL Apply 2 g topically 4 (four) times daily.   ezetimibe (ZETIA) 10 MG tablet Take 1 tablet (10 mg total) by mouth daily.   famotidine (PEPCID) 20 MG tablet TAKE 1 TABLET BY MOUTH TWICE A DAY   furosemide (LASIX) 40 MG tablet TAKE 1 TABLET BY MOUTH EVERY DAY   montelukast (SINGULAIR) 10 MG tablet TAKE 1 TABLET BY MOUTH EVERYDAY AT BEDTIME   nystatin cream (MYCOSTATIN) Apply 1 Application topically 2 (two) times daily. (Patient taking differently: Apply 1 Application topically as needed for dry skin.)   olmesartan (BENICAR) 40 MG tablet TAKE 1 TABLET BY MOUTH EVERY DAY   ondansetron (ZOFRAN-ODT) 4 MG disintegrating tablet TAKE 1 TABLET BY MOUTH EVERY 8 HOURS AS NEEDED FOR NAUSEA AND VOMITING   potassium chloride (KLOR-CON) 10 MEQ tablet TAKE 1 TABLET BY MOUTH EVERY DAY   sennosides-docusate sodium (SENOKOT-S) 8.6-50 MG tablet Take 1 tablet by  mouth daily as needed for constipation.   triamcinolone cream (KENALOG) 0.1 % APPLY TO AFFECTED AREA TOPICALLY TWICE A DAY AS NEEDED FOR SKIN IRRITATION/RASH   No facility-administered encounter medications on file as of 06/01/2023.    Allergies (verified) Aspirin and Latex   History: Past Medical History:  Diagnosis Date   Adenocarcinoma of cecum (HCC) 09/28/2019   ANEMIA, IRON DEFICIENCY 09/10/2007   Anxiety    Arthritis    Asthma    Blood transfusion without reported diagnosis    Cataract 2008   bilateral  cataract extraction   CATARACT EXTRACTION, HX OF 09/10/2007   DYSPEPSIA, HX OF 09/10/2007   GERD (gastroesophageal reflux disease)    HAIR LOSS 03/21/2009   HAY FEVER 09/10/2007   HEARING LOSS, SENSORINEURAL 09/10/2007   HYPERLIPIDEMIA 09/10/2007   HYPERTENSION 09/10/2007   LOW BACK PAIN 05/10/2008   Pain in joint, hand 03/01/2008   PEPTIC ULCER DISEASE 09/10/2007   POLYP, COLON 09/10/2007   POLYPECTOMY, HX OF 09/10/2007   Pulmonary embolism (HCC) 1- 2 non-occlusive 10/10/2019   SHINGLES 08/13/2009   SHOULDER PAIN, LEFT 02/12/2008   TAH/BSO, HX OF 09/10/2007   Tuberculosis    PATIENT DENIES AT PHONE INTERVIEW ON 12/12/2019   Past Surgical History:  Procedure Laterality Date   ABDOMINAL HYSTERECTOMY     BREAST SURGERY     breast biopsy-benign   CATARACT EXTRACTION     COLONOSCOPY     EYE SURGERY     bilateral cataract with lens implant   IRRIGATION AND DEBRIDEMENT SEBACEOUS CYST     LAPAROSCOPIC RIGHT HEMI COLECTOMY Right 12/15/2019   Procedure: LAPAROSCOPIC RIGHT HEMI COLECTOMY WITH LYSIS OF ADHESIONS;  Surgeon: Andria Meuse, MD;  Location: [facility];  Service: General;  Laterality: Right;   TOTAL ABDOMINAL HYSTERECTOMY W/ BILATERAL SALPINGOOPHORECTOMY     Family History  Problem Relation Age of Onset   Hypertension Mother    Coronary artery disease Mother    Heart disease Mother        CHF   Cancer Son 20       prostate cancer    Cancer Son 12       prostate cancer   Diabetes Neg Hx    Social History   Socioeconomic History   Marital status: Widowed    Spouse name: Not on file   Number of children: 4   Years of education: Not on file   Highest education level: Not on file  Occupational History   Occupation: retired  Tobacco Use   Smoking status: Former    Current packs/day: 0.00    Average packs/day: 0.2 packs/day for 25.0 years (5.0 ttl pk-yrs)    Types: Cigarettes    Start date: 12/01/1964    Quit date: 12/01/1989    Years since quitting: 33.5   Smokeless  tobacco: Never   Tobacco comments:    smoked a long time ago; Quit 90 or 91/ number is approximate  Vaping Use   Vaping status: Never Used  Substance and Sexual Activity   Alcohol use: No    Alcohol/week: 0.0 standard drinks of alcohol   Drug use: No   Sexual activity: Not Currently  Other Topics Concern   Not on file  Social History Narrative   3 sons, 54,59, 75 and 1 daughter 24, 4 grandchildren   SO with multiple medical problems   Social Determinants of Health   Financial Resource Strain: Low Risk  (06/01/2023)   Overall Financial Resource Strain (CARDIA)  Difficulty of Paying Living Expenses: Not very hard  Food Insecurity: No Food Insecurity (06/01/2023)   Hunger Vital Sign    Worried About Running Out of Food in the Last Year: Never true    Ran Out of Food in the Last Year: Never true  Transportation Needs: No Transportation Needs (03/11/2022)   PRAPARE - Administrator, Civil Service (Medical): No    Lack of Transportation (Non-Medical): No  Physical Activity: Inactive (06/01/2023)   Exercise Vital Sign    Days of Exercise per Week: 0 days    Minutes of Exercise per Session: 0 min  Stress: No Stress Concern Present (06/01/2023)   [employer] of Occupational Health - Occupational Stress Questionnaire    Feeling of Stress : Not at all  Social Connections: Moderately Integrated (06/01/2023)   Social Connection and Isolation Panel [NHANES]    Frequency of Communication with Friends and Family: More than three times a week    Frequency of Social Gatherings with Friends and Family: More than three times a week    Attends Religious Services: More than 4 times per year    Active Member of [employer] or Organizations: Yes    Attends Banker Meetings: Not on file    Marital Status: Widowed    Tobacco Counseling Counseling given: Not Answered Tobacco comments: smoked a long time ago; Quit 90 or 91/ number is approximate   Clinical Intake:  Pre-visit  preparation completed: Yes  Pain : No/denies pain Pain Score: 0-No pain     Diabetes: No  How often do you need to have someone help you when you read instructions, pamphlets, or other written materials from your doctor or pharmacy?: 1 - Never What is the last grade level you completed in school?: high school  Interpreter Needed?: No      Activities of Daily Living    06/01/2023   10:46 AM  In your present state of health, do you have any difficulty performing the following activities:  Hearing? 0  Vision? 0  Difficulty concentrating or making decisions? 0  Walking or climbing stairs? 1  Dressing or bathing? 0  Doing errands, shopping? 0  Preparing Food and eating ? N  Using the Toilet? N  In the past six months, have you accidently leaked urine? N  Do you have problems with loss of bowel control? N  Managing your Medications? N  Managing your Finances? N  Housekeeping or managing your Housekeeping? N    Patient Care Team: Myrlene Broker, MD as PCP - General (Internal Medicine) Christell Constant, MD as PCP - Cardiology (Cardiology) Andria Meuse, MD as Consulting Physician (General Surgery) Malachy Mood, MD as Consulting Physician (Hematology) Iva Boop, MD as Consulting Physician (Gastroenterology) Shon Millet, MD as Consulting Physician (Ophthalmology)  Indicate any recent Medical Services you may have received from other than [facility] providers in the past year (date may be approximate).     Assessment:   This is a routine wellness examination for [place].  Hearing/Vision screen No results found.  Dietary issues and exercise activities discussed:     Goals Addressed             This Visit's Progress    Family to eat at dinner table        Depression Screen    06/01/2023   10:51 AM 06/01/2023   10:49 AM 05/18/2023    2:08 PM 02/24/2023    1:45 PM 06/20/2022  8:49 AM 03/11/2022   11:26 AM 02/04/2022   10:21 AM  PHQ 2/9  Scores  PHQ - 2 Score 0 0 0 0 0 0 0  PHQ- 9 Score 0 0 0 0 0      Fall Risk    06/01/2023   10:48 AM 05/18/2023    2:08 PM 02/24/2023    1:44 PM 06/20/2022    8:49 AM 03/11/2022   11:18 AM  Fall Risk   Falls in the past year? 0 0 0 0 0  Number falls in past yr: 0 0 0 0 0  Injury with Fall? 0 0 0 0 0  Risk for fall due to : Impaired balance/gait;Impaired mobility    No Fall Risks  Follow up Falls prevention discussed Falls evaluation completed Falls evaluation completed  Falls evaluation completed    MEDICARE RISK AT HOME:    TIMED UP AND GO:  Was the test performed?  No    Cognitive Function:    10/12/2018    9:34 AM 08/10/2015   10:33 AM  MMSE - Mini Mental State Exam  Not completed:  --  Orientation to time 5   Orientation to Place 5   Registration 3   Attention/ Calculation 3   Recall 3   Language- name 2 objects 2   Language- repeat 1   Language- follow 3 step command 3   Language- read & follow direction 1   Write a sentence 1   Copy design 1   Total score 28         06/01/2023   10:52 AM 03/11/2022   11:29 AM 10/14/2019    9:42 AM  6CIT Screen  What Year? 0 points 0 points 0 points  What month? 0 points 0 points 0 points  What time? 0 points 0 points 0 points  Count back from 20 2 points 0 points 0 points  Months in reverse 4 points 0 points 0 points  Repeat phrase 6 points 0 points 2 points  Total Score 12 points 0 points 2 points    Immunizations Immunization History  Administered Date(s) Administered   Fluad Quad(high Dose 65+) 09/15/2019, 10/12/2020, 08/09/2021, 10/14/2022   Influenza Split 09/21/2012   Influenza Whole 11/08/2007, 09/12/2008, 09/11/2009, 09/24/2010   Influenza, High Dose Seasonal PF 10/11/2013, 08/15/2016, 10/06/2017, 09/01/2018   Influenza,inj,Quad PF,6+ Mos 08/08/2014, 08/14/2015   PFIZER(Purple Top)SARS-COV-2 Vaccination 01/27/2020, 02/22/2020, 09/29/2020   Pfizer Covid-19 Vaccine Bivalent Booster 49yrs & up 12/24/2021    Pneumococcal Conjugate-13 09/22/2013   Pneumococcal Polysaccharide-23 03/01/2008, 12/18/2019   Td 05/17/2010    TDAP status: Due, Education has been provided regarding the importance of this vaccine. Advised may receive this vaccine at local pharmacy or Health Dept. Aware to provide a copy of the vaccination record if obtained from local pharmacy or Health Dept. Verbalized acceptance and understanding.  Flu Vaccine status: Up to date  Pneumococcal vaccine status: Up to date  Covid-19 vaccine status: Completed vaccines  Qualifies for Shingles Vaccine? Yes   Zostavax completed No   Shingrix Completed?: No.    Education has been provided regarding the importance of this vaccine. Patient has been advised to call insurance company to determine out of pocket expense if they have not yet received this vaccine. Advised may also receive vaccine at local pharmacy or Health Dept. Verbalized acceptance and understanding.  Screening Tests Health Maintenance  Topic Date Due   DTaP/Tdap/Td (2 - Tdap) 05/17/2020   COVID-19 Vaccine (5 - 2023-24 season)  08/01/2022   MAMMOGRAM  06/12/2023   Colonoscopy  02/24/2024 (Originally 03/18/2022)   INFLUENZA VACCINE  07/02/2023   Zoster Vaccines- Shingrix (2 of 2) 07/29/2023   Medicare Annual Wellness (AWV)  05/31/2024   Pneumonia Vaccine 40+ Years old  Completed   DEXA SCAN  Completed   HPV VACCINES  Aged Out    Health Maintenance  Health Maintenance Due  Topic Date Due   DTaP/Tdap/Td (2 - Tdap) 05/17/2020   COVID-19 Vaccine (5 - 2023-24 season) 08/01/2022   MAMMOGRAM  06/12/2023    Colorectal cancer screening: No longer required.   Mammogram status: Completed 06/12/2023. Repeat every year    Lung Cancer Screening: (Low Dose CT Chest recommended if Age 13-80 years, 20 pack-year currently smoking OR have quit w/in 15years.) does not qualify.   Lung Cancer Screening Referral: na  Additional Screening:  Hepatitis C Screening: does not qualify;  Completed   Vision Screening: Recommended annual ophthalmology exams for early detection of glaucoma and other disorders of the eye. Is the patient up to date with their annual eye exam?  Yes  Who is the provider or what is the name of the office in which the patient attends annual eye exams? [facility] If pt is not established with a provider, would they like to be referred to a provider to establish care?  Established  .   Dental Screening: Recommended annual dental exams for proper oral hygiene  Diabetic Foot Exam:   Community Resource Referral / Chronic Care Management: CRR required this visit?  No   CCM required this visit?  No     Plan:     I have personally reviewed and noted the following in the patient's chart:   Medical and social history Use of alcohol, tobacco or illicit drugs  Current medications and supplements including opioid prescriptions. Patient is not currently taking opioid prescriptions. Functional ability and status Nutritional status Physical activity Advanced directives List of other physicians Hospitalizations, surgeries, and ER visits in previous 12 months Vitals Screenings to include cognitive, depression, and falls Referrals and appointments  In addition, I have reviewed and discussed with patient certain preventive protocols, quality metrics, and best practice recommendations. A written personalized care plan for preventive services as well as general preventive health recommendations were provided to patient.     Delana Meyer   06/01/2023   After Visit Summary: (MyChart) Due to this being a telephonic visit, the after visit summary with patients personalized plan was offered to patient via MyChart   Nurse Notes: none

## 2023-06-25 DIAGNOSIS — E669 Obesity, unspecified: Secondary | ICD-10-CM | POA: Diagnosis not present

## 2023-06-25 DIAGNOSIS — R600 Localized edema: Secondary | ICD-10-CM | POA: Diagnosis not present

## 2023-06-25 DIAGNOSIS — I10 Essential (primary) hypertension: Secondary | ICD-10-CM | POA: Diagnosis not present

## 2023-06-25 DIAGNOSIS — L309 Dermatitis, unspecified: Secondary | ICD-10-CM | POA: Diagnosis not present

## 2023-06-25 DIAGNOSIS — K5900 Constipation, unspecified: Secondary | ICD-10-CM | POA: Diagnosis not present

## 2023-06-25 DIAGNOSIS — E785 Hyperlipidemia, unspecified: Secondary | ICD-10-CM | POA: Diagnosis not present

## 2023-06-25 DIAGNOSIS — K219 Gastro-esophageal reflux disease without esophagitis: Secondary | ICD-10-CM | POA: Diagnosis not present

## 2023-06-25 DIAGNOSIS — F419 Anxiety disorder, unspecified: Secondary | ICD-10-CM | POA: Diagnosis not present

## 2023-06-25 DIAGNOSIS — M1990 Unspecified osteoarthritis, unspecified site: Secondary | ICD-10-CM | POA: Diagnosis not present

## 2023-07-11 ENCOUNTER — Other Ambulatory Visit: Payer: Self-pay | Admitting: Internal Medicine

## 2023-07-13 ENCOUNTER — Other Ambulatory Visit: Payer: Self-pay | Admitting: Internal Medicine

## 2023-07-22 ENCOUNTER — Other Ambulatory Visit: Payer: Self-pay | Admitting: Internal Medicine

## 2023-07-22 ENCOUNTER — Ambulatory Visit (INDEPENDENT_AMBULATORY_CARE_PROVIDER_SITE_OTHER): Payer: Medicare PPO | Admitting: Podiatry

## 2023-07-22 DIAGNOSIS — Z91199 Patient's noncompliance with other medical treatment and regimen due to unspecified reason: Secondary | ICD-10-CM

## 2023-07-22 NOTE — Progress Notes (Signed)
1. No-show for appointment     

## 2023-08-26 ENCOUNTER — Other Ambulatory Visit: Payer: Self-pay | Admitting: Internal Medicine

## 2023-08-26 DIAGNOSIS — F419 Anxiety disorder, unspecified: Secondary | ICD-10-CM

## 2023-09-01 ENCOUNTER — Ambulatory Visit: Payer: Medicare PPO | Admitting: Podiatry

## 2023-09-01 ENCOUNTER — Encounter: Payer: Self-pay | Admitting: Podiatry

## 2023-09-01 DIAGNOSIS — M79676 Pain in unspecified toe(s): Secondary | ICD-10-CM | POA: Diagnosis not present

## 2023-09-01 DIAGNOSIS — D689 Coagulation defect, unspecified: Secondary | ICD-10-CM | POA: Diagnosis not present

## 2023-09-01 DIAGNOSIS — Q828 Other specified congenital malformations of skin: Secondary | ICD-10-CM | POA: Diagnosis not present

## 2023-09-01 DIAGNOSIS — B351 Tinea unguium: Secondary | ICD-10-CM

## 2023-09-01 NOTE — Progress Notes (Signed)
Subjective:  Patient ID: Jamie [name], female    DOB: 11/02/1933,  MRN: [id number]  89 y.o. female presents at risk foot care with h/o clotting disorder and painful porokeratotic lesion(s) of both feet and painful mycotic toenails that limit ambulation. Painful toenails interfere with ambulation. Aggravating factors include wearing enclosed shoe gear. Pain is relieved with periodic professional debridement. Painful porokeratotic lesions are aggravated when weightbearing with and without shoegear. Pain is relieved with periodic professional debridement.  Patient has h/o pulmonary embolism.  No chief complaint on file.  New problem(s): None   PCP is Myrlene Broker, MD , and last visit was February 24, 2023.  Allergies  Allergen Reactions   Aspirin Other (See Comments)    Stomach upset   Latex Itching and Rash   Review of Systems: Negative except as noted in the HPI.   Objective:  Jamie [name] is a pleasant 89 y.o. female WD, WN in NAD.Marland Kitchen AAO x 3.  Vascular Examination: Vascular status intact b/l with palpable pedal pulses. CFT immediate b/l. Pedal hair diminished. No edema. No pain with calf compression b/l. Skin temperature gradient WNL b/l. No varicosities noted. No cyanosis or clubbing noted.  Neurological Examination: Sensation grossly intact b/l with 10 gram monofilament. Vibratory sensation intact b/l.  Dermatological Examination: Pedal skin with normal turgor, texture and tone b/l. No open wounds nor interdigital macerations noted. Toenails 1-5 b/l thick, discolored, elongated with subungual debris and pain on dorsal palpation.   Porokeratotic lesion(s) left heel, submet head 1 right foot, and submet head 2 left foot. No erythema, no edema, no drainage, no fluctuance.  Musculoskeletal Examination: Muscle strength 5/5 to b/l LE.  No pain, crepitus noted b/l. Hammertoes b/l. Patient ambulates with cane assistance.  Radiographs: None   Assessment:   1.  Pain due to onychomycosis of toenail   2. Porokeratosis   3. Clotting disorder (HCC)    Plan:  -Patient was evaluated and treated. All patient's and/or POA's questions/concerns answered on today's visit. -Patient to continue soft, supportive shoe gear daily. -Toenails 1-5 b/l were debrided in length and girth with sterile nail nippers and dremel without iatrogenic bleeding.  -Porokeratotic lesion(s) left heel, submet head 1 right foot, and submet head 2 left foot pared and enucleated with sterile currette without incident. Total number of lesions debrided=3. -Patient/POA to call should there be question/concern in the interim.  Return in about 3 months (around 12/02/2023).  Freddie Breech, DPM

## 2023-09-21 DIAGNOSIS — H43393 Other vitreous opacities, bilateral: Secondary | ICD-10-CM | POA: Diagnosis not present

## 2023-09-21 DIAGNOSIS — H35033 Hypertensive retinopathy, bilateral: Secondary | ICD-10-CM | POA: Diagnosis not present

## 2023-09-21 DIAGNOSIS — Z961 Presence of intraocular lens: Secondary | ICD-10-CM | POA: Diagnosis not present

## 2023-09-21 DIAGNOSIS — H04123 Dry eye syndrome of bilateral lacrimal glands: Secondary | ICD-10-CM | POA: Diagnosis not present

## 2023-10-14 ENCOUNTER — Ambulatory Visit: Payer: Medicare PPO | Admitting: Internal Medicine

## 2023-10-14 ENCOUNTER — Encounter: Payer: Self-pay | Admitting: Internal Medicine

## 2023-10-14 VITALS — BP 138/64 | HR 79 | Temp 98.3°F | Ht 63.0 in | Wt 178.0 lb

## 2023-10-14 DIAGNOSIS — M25561 Pain in right knee: Secondary | ICD-10-CM | POA: Diagnosis not present

## 2023-10-14 DIAGNOSIS — G8929 Other chronic pain: Secondary | ICD-10-CM

## 2023-10-14 DIAGNOSIS — Z23 Encounter for immunization: Secondary | ICD-10-CM

## 2023-10-14 DIAGNOSIS — I10 Essential (primary) hypertension: Secondary | ICD-10-CM

## 2023-10-14 NOTE — Progress Notes (Unsigned)
   Subjective:   Patient ID: Francisco Capuchin, female    DOB: 22-Sep-1933, 89 y.o.   MRN: [id number]  HPI The patient is an 89 YO female coming in for follow up medical conditions. No new concerns. Voltaren gel helping with arthritis.   Review of Systems  Constitutional: Negative.   HENT: Negative.    Eyes: Negative.   Respiratory:  Negative for cough, chest tightness and shortness of breath.   Cardiovascular:  Negative for chest pain, palpitations and leg swelling.  Gastrointestinal:  Negative for abdominal distention, abdominal pain, constipation, diarrhea, nausea and vomiting.  Musculoskeletal:  Positive for arthralgias and gait problem.  Skin: Negative.   Psychiatric/Behavioral: Negative.      Objective:  Physical Exam Constitutional:      Appearance: She is well-developed.  HENT:     Head: Normocephalic and atraumatic.  Cardiovascular:     Rate and Rhythm: Normal rate and regular rhythm.  Pulmonary:     Effort: Pulmonary effort is normal. No respiratory distress.     Breath sounds: Normal breath sounds. No wheezing or rales.  Abdominal:     General: Bowel sounds are normal. There is no distension.     Palpations: Abdomen is soft.     Tenderness: There is no abdominal tenderness. There is no rebound.  Musculoskeletal:        General: Tenderness present.     Cervical back: Normal range of motion.  Skin:    General: Skin is warm and dry.  Neurological:     Mental Status: She is alert and oriented to person, place, and time.     Coordination: Coordination abnormal.     Comments: Wheelchair for long distances     Vitals:   10/14/23 0903  BP: 138/64  Pulse: 79  Temp: 98.3 F (36.8 C)  TempSrc: Oral  SpO2: 98%  Weight: 178 lb (80.7 kg)  Height: 5\' 3"  (1.6 m)    Assessment & Plan:  Flu shot given at visit

## 2023-10-14 NOTE — Patient Instructions (Signed)
We have given you the flu shot today.  

## 2023-10-15 NOTE — Assessment & Plan Note (Signed)
Voltaren gel is helping well and making her more mobile. She will continue to use as this is safest option for treatment. She knows that she can use tylenol as well for pain.

## 2023-10-15 NOTE — Assessment & Plan Note (Signed)
BP stable and will continue amlodipine 5 mg daily and lasix 40 mg daily and benicar 40 mg daily. Recent labs stable no labs today.

## 2023-11-29 ENCOUNTER — Other Ambulatory Visit: Payer: Self-pay | Admitting: Internal Medicine

## 2023-12-09 ENCOUNTER — Ambulatory Visit: Payer: Medicare PPO | Admitting: Podiatry

## 2023-12-11 ENCOUNTER — Telehealth: Payer: Medicare PPO | Admitting: Family Medicine

## 2023-12-11 ENCOUNTER — Emergency Department (HOSPITAL_COMMUNITY): Payer: Medicare PPO

## 2023-12-11 ENCOUNTER — Other Ambulatory Visit: Payer: Self-pay

## 2023-12-11 ENCOUNTER — Emergency Department (HOSPITAL_COMMUNITY)
Admission: EM | Admit: 2023-12-11 | Discharge: 2023-12-11 | Disposition: A | Discharge status: Home / Self Care | Payer: Medicare PPO | Attending: Emergency Medicine | Admitting: Emergency Medicine

## 2023-12-11 DIAGNOSIS — R059 Cough, unspecified: Secondary | ICD-10-CM | POA: Diagnosis present

## 2023-12-11 DIAGNOSIS — U071 COVID-19: Secondary | ICD-10-CM | POA: Diagnosis not present

## 2023-12-11 DIAGNOSIS — R918 Other nonspecific abnormal finding of lung field: Secondary | ICD-10-CM | POA: Diagnosis not present

## 2023-12-11 DIAGNOSIS — J1282 Pneumonia due to coronavirus disease 2019: Secondary | ICD-10-CM | POA: Insufficient documentation

## 2023-12-11 DIAGNOSIS — J189 Pneumonia, unspecified organism: Secondary | ICD-10-CM

## 2023-12-11 DIAGNOSIS — Z91040 Latex allergy status: Secondary | ICD-10-CM | POA: Diagnosis not present

## 2023-12-11 DIAGNOSIS — J45909 Unspecified asthma, uncomplicated: Secondary | ICD-10-CM | POA: Insufficient documentation

## 2023-12-11 DIAGNOSIS — I517 Cardiomegaly: Secondary | ICD-10-CM | POA: Diagnosis not present

## 2023-12-11 DIAGNOSIS — I700 Atherosclerosis of aorta: Secondary | ICD-10-CM | POA: Diagnosis not present

## 2023-12-11 LAB — BASIC METABOLIC PANEL
Anion gap: 7 (ref 5–15)
BUN: 9 mg/dL (ref 8–23)
CO2: 23 mmol/L (ref 22–32)
Calcium: 8.8 mg/dL — ABNORMAL LOW (ref 8.9–10.3)
Chloride: 109 mmol/L (ref 98–111)
Creatinine, Ser: 0.94 mg/dL (ref 0.44–1.00)
GFR, Estimated: 58 mL/min — ABNORMAL LOW (ref >60)
Glucose, Bld: 126 mg/dL — ABNORMAL HIGH (ref 70–99)
Potassium: 3.9 mmol/L (ref 3.5–5.1)
Sodium: 139 mmol/L (ref 135–145)

## 2023-12-11 LAB — CBC WITH DIFFERENTIAL/PLATELET
Abs Immature Granulocytes: 0.02 10*3/uL (ref 0.00–0.07)
Basophils Absolute: 0 10*3/uL (ref 0.0–0.1)
Basophils Relative: 0 %
Eosinophils Absolute: 0.1 10*3/uL (ref 0.0–0.5)
Eosinophils Relative: 2 %
HCT: 35.2 % — ABNORMAL LOW (ref 36.0–46.0)
Hemoglobin: 11.4 g/dL — ABNORMAL LOW (ref 12.0–15.0)
Immature Granulocytes: 0 %
Lymphocytes Relative: 8 %
Lymphs Abs: 0.6 10*3/uL — ABNORMAL LOW (ref 0.7–4.0)
MCH: 32.1 pg (ref 26.0–34.0)
MCHC: 32.4 g/dL (ref 30.0–36.0)
MCV: 99.2 fL (ref 80.0–100.0)
Monocytes Absolute: 0.8 10*3/uL (ref 0.1–1.0)
Monocytes Relative: 11 %
Neutro Abs: 5.6 10*3/uL (ref 1.7–7.7)
Neutrophils Relative %: 79 %
Platelets: 186 10*3/uL (ref 150–400)
RBC: 3.55 MIL/uL — ABNORMAL LOW (ref 3.87–5.11)
RDW: 13.4 % (ref 11.5–15.5)
WBC: 7.1 10*3/uL (ref 4.0–10.5)
nRBC: 0 % (ref 0.0–0.2)

## 2023-12-11 LAB — SARS CORONAVIRUS 2 BY RT PCR: SARS Coronavirus 2 by RT PCR: POSITIVE — AB

## 2023-12-11 MED ORDER — ACETAMINOPHEN 500 MG PO TABS
1000.0000 mg | ORAL_TABLET | Freq: Once | ORAL | Status: AC
  Administered 2023-12-11: 1000 mg via ORAL
  Filled 2023-12-11: qty 2

## 2023-12-11 MED ORDER — AMOXICILLIN 500 MG PO CAPS
1000.0000 mg | ORAL_CAPSULE | Freq: Three times a day (TID) | ORAL | 0 refills | Status: DC
Start: 2023-12-11 — End: 2023-12-21

## 2023-12-11 MED ORDER — PREDNISONE 20 MG PO TABS
60.0000 mg | ORAL_TABLET | Freq: Once | ORAL | Status: AC
Start: 2023-12-11 — End: 2023-12-11
  Administered 2023-12-11: 60 mg via ORAL
  Filled 2023-12-11: qty 3

## 2023-12-11 MED ORDER — PREDNISONE 20 MG PO TABS: 40.0000 mg | ORAL_TABLET | Freq: Every day | ORAL | 0 refills | Status: AC

## 2023-12-11 MED ORDER — IPRATROPIUM-ALBUTEROL 0.5-2.5 (3) MG/3ML IN SOLN
3.0000 mL | Freq: Once | RESPIRATORY_TRACT | Status: AC
  Administered 2023-12-11: 3 mL via RESPIRATORY_TRACT
  Filled 2023-12-11: qty 3

## 2023-12-11 MED ORDER — PAXLOVID (150/100) 10 X 150 MG & 10 X 100MG PO TBPK: 2.0000 | ORAL_TABLET | Freq: Two times a day (BID) | ORAL | 0 refills | Status: AC

## 2023-12-11 MED ORDER — DOXYCYCLINE HYCLATE 100 MG PO CAPS
100.0000 mg | ORAL_CAPSULE | Freq: Two times a day (BID) | ORAL | 0 refills | Status: DC
Start: 2023-12-11 — End: 2024-01-08

## 2023-12-11 NOTE — ED Provider Notes (Signed)
 [place] [facility] AT [facility] Provider Note   CSN: [id number] Arrival date & time: 12/11/23  1058     History  Chief Complaint  Patient presents with   Cough   Nasal Congestion    Jamie [name] is a 89 y.o. female, history of asthma, who presents to the ED secondary to productive cough, sore throat, runny nose x 3 days.  She states she is not have any shortness of breath, but has a productive cough.  Has been using her typical inhalers, as instructed, albuterol  up to 4 times a day.  Denies any audible wheezing.  No chest pain.  Denies any sick contacts     Home Medications Prior to Admission medications   Medication Sig Start Date End Date Taking? Authorizing Provider  amoxicillin  (AMOXIL ) 500 MG capsule Take 2 capsules (1,000 mg total) by mouth 3 (three) times daily. 12/11/23  Yes Antavia Tandy L, PA  doxycycline  (VIBRAMYCIN ) 100 MG capsule Take 1 capsule (100 mg total) by mouth 2 (two) times daily. 12/11/23  Yes Daelan Gatt L, PA  nirmatrelvir/ritonavir, renal dosing, (PAXLOVID , 150/100,) 10 x 150 MG & 10 x 100MG  TBPK Take 2 tablets by mouth 2 (two) times daily for 5 days. Dosage for moderate renal impairment (eGFR >/= 30 to <60 mL/min): 150 mg nirmatrelvir (one 150 mg tablet) with 100 mg ritonavir (one 100 mg tablet), with both tablets taken together twice daily for 5 days. Not recommended if eGFR < 30 mL/min. PAXLOVID  is not recommend in patients with severe hepatic impairment (Child-Pugh Class C). 12/11/23 12/16/23 Yes Exavior Kimmons L, PA  predniSONE  (DELTASONE ) 20 MG tablet Take 2 tablets (40 mg total) by mouth daily for 5 days. 12/11/23 12/16/23 Yes Noreene Boreman L, PA  acetaminophen  (TYLENOL ) 500 MG tablet Take 500-1,000 mg by mouth every 6 (six) hours as needed (for pain.).    [provider]  albuterol  (VENTOLIN  HFA) 108 (90 Base) MCG/ACT inhaler INHALE 2 PUFFS BY MOUTH EVERY 4 HOURS AS NEEDED FOR WHEEZE OR FOR SHORTNESS OF BREATH 07/23/23    Rollene Almarie LABOR, MD  ALPRAZolam  (XANAX ) 0.5 MG tablet TAKE 1 TABLET BY MOUTH EVERY DAY AS NEEDED FOR ANXIETY 08/27/23   Rollene Almarie LABOR, MD  alum & mag hydroxide-simeth (MAALOX/MYLANTA) 200-200-20 MG/5ML suspension Take 15 mLs by mouth every 6 (six) hours as needed for indigestion or heartburn.    [provider]  amLODipine  (NORVASC ) 5 MG tablet TAKE 1 TABLET BY MOUTH EVERY DAY 07/23/23   Rollene Almarie LABOR, MD  atorvastatin  (LIPITOR) 40 MG tablet Take 1 tablet (40 mg total) by mouth daily. 11/10/22   Chandrasekhar, Mahesh A, MD  benzonatate  (TESSALON ) 200 MG capsule TAKE 1 CAPSULE (200 MG TOTAL) BY MOUTH 3 (THREE) TIMES DAILY AS NEEDED FOR COUGH. 08/27/23   Rollene Almarie LABOR, MD  betamethasone  dipropionate 0.05 % cream APPLY TO AFFECTED AREA TWICE A DAY 06/04/22   Rollene Almarie LABOR, MD  diclofenac  Sodium (VOLTAREN ) 1 % GEL Apply 2 g topically 4 (four) times daily. 05/18/23   Rollene Almarie LABOR, MD  ezetimibe  (ZETIA ) 10 MG tablet TAKE 1 TABLET BY MOUTH EVERY DAY 11/30/23   Chandrasekhar, Mahesh A, MD  famotidine  (PEPCID ) 20 MG tablet TAKE 1 TABLET BY MOUTH TWICE A DAY 08/27/23   Rollene Almarie LABOR, MD  fluticasone  furoate-vilanterol (BREO ELLIPTA ) 100-25 MCG/ACT AEPB INHALE 1 PUFF BY MOUTH EVERY DAY 07/13/23   Rollene Almarie LABOR, MD  furosemide  (LASIX ) 40 MG tablet TAKE 1 TABLET  BY MOUTH EVERY DAY 07/23/23   Rollene Almarie LABOR, MD  montelukast  (SINGULAIR ) 10 MG tablet TAKE 1 TABLET BY MOUTH EVERYDAY AT BEDTIME 07/23/23   Rollene Almarie LABOR, MD  nystatin  cream (MYCOSTATIN ) Apply 1 Application topically 2 (two) times daily. Patient taking differently: Apply 1 Application topically as needed for dry skin. 06/20/22   Rollene Almarie LABOR, MD  olmesartan  (BENICAR ) 40 MG tablet TAKE 1 TABLET BY MOUTH EVERY DAY 05/18/23   Rollene Almarie LABOR, MD  ondansetron  (ZOFRAN -ODT) 4 MG disintegrating tablet TAKE 1 TABLET BY MOUTH EVERY 8 HOURS AS NEEDED FOR NAUSEA AND VOMITING  08/27/23   Rollene Almarie LABOR, MD  potassium chloride  (KLOR-CON ) 10 MEQ tablet TAKE 1 TABLET BY MOUTH EVERY DAY 09/23/21   Lanny Callander, MD  sennosides-docusate sodium (SENOKOT-S) 8.6-50 MG tablet Take 1 tablet by mouth daily as needed for constipation.    [provider]  triamcinolone  cream (KENALOG ) 0.1 % APPLY TO AFFECTED AREA TOPICALLY TWICE A DAY AS NEEDED FOR SKIN IRRITATION/RASH 08/27/23   Rollene Almarie LABOR, MD      Allergies    Aspirin  and Latex    Review of Systems   Review of Systems  Respiratory:  Positive for cough. Negative for shortness of breath.     Physical Exam Updated Vital Signs BP (!) 112/52 (BP Location: Right Arm)   Pulse 76   Temp (!) 100.5 F (38.1 C)   Resp 17   Ht 5' 3 (1.6 m)   Wt 78 kg   SpO2 100%   BMI 30.47 kg/m  Physical Exam Vitals and nursing note reviewed.  Constitutional:      General: She is not in acute distress.    Appearance: She is well-developed.  HENT:     Head: Normocephalic and atraumatic.     Nose:     Comments: +rhinorrhea Eyes:     Conjunctiva/sclera: Conjunctivae normal.  Cardiovascular:     Rate and Rhythm: Normal rate and regular rhythm.     Heart sounds: No murmur heard. Pulmonary:     Effort: Pulmonary effort is normal. No respiratory distress.     Comments: Rhonchi and wheezing throughout, nonlabored respirations, no retractions, nonhypoxic Abdominal:     Palpations: Abdomen is soft.     Tenderness: There is no abdominal tenderness.  Musculoskeletal:        General: No swelling.     Cervical back: Neck supple.  Skin:    General: Skin is warm and dry.     Capillary Refill: Capillary refill takes less than 2 seconds.  Neurological:     Mental Status: She is alert.  Psychiatric:        Mood and Affect: Mood normal.     ED Results / Procedures / Treatments   Labs (all labs ordered are listed, but only abnormal results are displayed) Labs Reviewed  SARS CORONAVIRUS 2 BY RT PCR - Abnormal;  Notable for the following components:      Result Value   SARS Coronavirus 2 by RT PCR POSITIVE (*)    All other components within normal limits  CBC WITH DIFFERENTIAL/PLATELET - Abnormal; Notable for the following components:   RBC 3.55 (*)    Hemoglobin 11.4 (*)    HCT 35.2 (*)    Lymphs Abs 0.6 (*)    All other components within normal limits  BASIC METABOLIC PANEL - Abnormal; Notable for the following components:   Glucose, Bld 126 (*)    Calcium  8.8 (*)  GFR, Estimated 58 (*)    All other components within normal limits    EKG None  Radiology DG Chest Portable 1 View Result Date: 12/11/2023 CLINICAL DATA:  Cough congestion EXAM: PORTABLE CHEST 1 VIEW COMPARISON:  02/09/2022 FINDINGS: Streaky atelectasis or scarring at the bases. Willisha Sligar focal atelectasis or minimal infiltrate at the medial left base. Borderline cardiomegaly. Aortic atherosclerosis. No pneumothorax IMPRESSION: Streaky atelectasis or scarring at the bases. Lanice Folden focal atelectasis or minimal infiltrate at the medial left base. Electronically Signed   By: Luke Bun M.D.   On: 12/11/2023 15:32    Procedures Procedures    Medications Ordered in ED Medications  acetaminophen  (TYLENOL ) tablet 1,000 mg (1,000 mg Oral Given 12/11/23 1459)  predniSONE  (DELTASONE ) tablet 60 mg (60 mg Oral Given 12/11/23 1536)  ipratropium-albuterol  (DUONEB) 0.5-2.5 (3) MG/3ML nebulizer solution 3 mL (3 mLs Nebulization Given 12/11/23 1605)    ED Course/ Medical Decision Making/ A&P Click here for ABCD2, HEART and other calculatorsREFRESH Note before signing :1}                              Medical Decision Making Patient is an 89 year old female, here for productive cough, sore throat, runny nose x 3 days.  Denies any shortness of breath or chest pain.  We will obtain a chest x-ray, blood work, including COVID swab, for further evaluation.  She is overall well-appearing on exam, but has rhonchi, DuoNeb ordered.  Will give  prednisone , given history of asthma, possible asthma exacerbation secondary to URI.  Has been compliant with Breo at home, as well as albuterol  as needed.  Amount and/or Complexity of Data Reviewed Labs: ordered. Decision-making details documented in ED Course.    Details: Unremarkable except for COVID-positive Radiology: ordered.    Details: Chest x-ray, shows possible left base pneumonia Discussion of management or test interpretation with external provider(s): Discussed with patient, she is feeling better after the DuoNeb, has albuterol  for home.  She is nonhypoxic, and has nonlabored respirations.  Is overall very well-appearing, chest x-ray shows possible pneumonia, discussed with Dr. Franklyn, we will go ahead and treat, with doxycycline , and amoxicillin , will also give Paxlovid , given COVID-positive, 89, increased risk given asthma.  I have prescribed her a renal  base dose, given her renal function.  Additionally I have instructed her to stop her amlodipine  for 5 days while on the Paxlovid , and stop statin for 10 days, while on Paxlovid , and 5 days after.  Discussed with pharmacist Dorn, who states these are the only modifications necessary for her medications for use of Paxlovid .  We discussed return precautions, and follow-up with PCP.  Discharged home.  Encouraged to buy pulse ox to check oxygen at home  Risk OTC drugs. Prescription drug management.    Final Clinical Impression(s) / ED Diagnoses Final diagnoses:  Pneumonia of right lower lobe due to infectious organism  COVID-19    Rx / DC Orders ED Discharge Orders          Ordered    nirmatrelvir/ritonavir, renal dosing, (PAXLOVID , 150/100,) 10 x 150 MG & 10 x 100MG  TBPK  2 times daily        12/11/23 1631    doxycycline  (VIBRAMYCIN ) 100 MG capsule  2 times daily        12/11/23 1631    amoxicillin  (AMOXIL ) 500 MG capsule  3 times daily        12/11/23 1631  predniSONE  (DELTASONE ) 20 MG tablet  Daily        12/11/23  1640              Davanee Klinkner, Lyle CROME, PA 12/11/23 1649    Franklyn Sid SAILOR, MD 12/12/23 2116

## 2023-12-11 NOTE — Discharge Instructions (Addendum)
 I prescribed you 2 antibiotics, to treat your possible pneumonia, as well as Paxlovid  to treat your COVID.  Please stop taking your statin while taking Paxlovid , as well as stop taking it for 5 days after finishing your Paxlovid .  Please also cut your amlodipine  in half, or stop taking it for 5 days while you are taking your Paxlovid .  If you have worsening shortness of breath, chest pain, please return to the ER. Please take Tylenol  for your fever, and drink lots of fluids, and rest.

## 2023-12-14 ENCOUNTER — Telehealth: Payer: Self-pay

## 2023-12-14 NOTE — Transitions of Care (Post Inpatient/ED Visit) (Signed)
 12/14/2023  Name: Jamie [name] MRN: [id number] DOB: Dec 07, 1932  Today's TOC FU Call Status: Today's TOC FU Call Status:: Successful TOC FU Call Completed TOC FU Call Complete Date: 12/14/23 Patient's Name and Date of Birth confirmed.  Transition Care Management Follow-up Telephone Call Date of Discharge: 12/11/23 Discharge Facility: [facility]) Type of Discharge: Emergency Department Reason for ED Visit: Other: (Pneumonia of right lower lobe due to infectious organism) How have you been since you were released from the hospital?: Better Any questions or concerns?: No  Items Reviewed: Did you receive and understand the discharge instructions provided?: Yes Medications obtained,verified, and reconciled?: Yes (Medications Reviewed) Any new allergies since your discharge?: No Dietary orders reviewed?: NA Do you have support at home?: Yes  Medications Reviewed Today: Medications Reviewed Today     Reviewed by Lang Avelina PARAS, CMA (Certified Medical Assistant) on 12/14/23 at 1259  Med List Status: <None>   Medication Order Taking? Sig Documenting Provider Last Dose Status Informant  acetaminophen  (TYLENOL ) 500 MG tablet [id number] No Take 500-1,000 mg by mouth every 6 (six) hours as needed (for pain.). [provider] Taking Active   albuterol  (VENTOLIN  HFA) 108 (90 Base) MCG/ACT inhaler [id number] No INHALE 2 PUFFS BY MOUTH EVERY 4 HOURS AS NEEDED FOR WHEEZE OR FOR SHORTNESS OF SHERIDA Rollene Almarie DELENA, MD Taking Active   ALPRAZolam  (XANAX ) 0.5 MG tablet [id number] No TAKE 1 TABLET BY MOUTH EVERY DAY AS NEEDED FOR ANXIETY Rollene Almarie DELENA, MD Taking Active   alum & mag hydroxide-simeth (MAALOX/MYLANTA) 200-200-20 MG/5ML suspension [id number] No Take 15 mLs by mouth every 6 (six) hours as needed for indigestion or heartburn. [provider] Taking Active   amLODipine  (NORVASC ) 5 MG tablet [id number] No TAKE 1 TABLET BY MOUTH EVERY DAY Rollene Almarie DELENA, MD Taking Active   amoxicillin  (AMOXIL ) 500 MG capsule [id number]  Take 2 capsules (1,000 mg total) by mouth 3 (three) times daily. Small, Brooke L, PA  Active   atorvastatin  (LIPITOR) 40 MG tablet [id number] No Take 1 tablet (40 mg total) by mouth daily. Santo Stanly DELENA, MD Taking Active   benzonatate  (TESSALON ) 200 MG capsule [id number] No TAKE 1 CAPSULE (200 MG TOTAL) BY MOUTH 3 (THREE) TIMES DAILY AS NEEDED FOR COUGH. Rollene Almarie DELENA, MD Taking Active   betamethasone  dipropionate 0.05 % cream [id number] No APPLY TO AFFECTED AREA TWICE A DAY Rollene Almarie DELENA, MD Taking Active   diclofenac  Sodium (VOLTAREN ) 1 % GEL [id number] No Apply 2 g topically 4 (four) times daily. Rollene Almarie DELENA, MD Taking Active   doxycycline  (VIBRAMYCIN ) 100 MG capsule [id number]  Take 1 capsule (100 mg total) by mouth 2 (two) times daily. Small, Brooke L, PA  Active   ezetimibe  (ZETIA ) 10 MG tablet [id number]  TAKE 1 TABLET BY MOUTH EVERY DAY Chandrasekhar, Mahesh A, MD  Active   famotidine  (PEPCID ) 20 MG tablet [id number] No TAKE 1 TABLET BY MOUTH TWICE A DAY Rollene Almarie DELENA, MD Taking Active   fluticasone  furoate-vilanterol (BREO ELLIPTA ) 100-25 MCG/ACT AEPB [id number] No INHALE 1 PUFF BY MOUTH EVERY DAY Rollene Almarie DELENA, MD Taking Active   furosemide  (LASIX ) 40 MG tablet [id number] No TAKE 1 TABLET BY MOUTH EVERY DAY Rollene Almarie DELENA, MD Taking Active   montelukast  (SINGULAIR ) 10 MG tablet [id number] No TAKE 1 TABLET BY MOUTH EVERYDAY AT BEDTIME Rollene Almarie DELENA, MD Taking Active   nirmatrelvir/ritonavir, renal dosing, (PAXLOVID , 150/100,) 10 x 150 MG & 10 x 100MG  TBPK [id number]  Take 2 tablets by mouth 2 (two) times daily for 5 days. Dosage for moderate renal impairment (eGFR >/= 30 to <60 mL/min): 150 mg nirmatrelvir (one 150 mg tablet) with 100 mg ritonavir (one 100 mg tablet), with both tablets taken together twice daily for 5 days. Not recommended if eGFR < 30 mL/min.  PAXLOVID  is not recommend in patients with severe hepatic impairment (Child-Pugh Class C). Small, Brooke L, PA  Active   nystatin  cream (MYCOSTATIN ) [id number] No Apply 1 Application topically 2 (two) times daily.  Patient taking differently: Apply 1 Application topically as needed for dry skin.   Rollene Almarie LABOR, MD Taking Active   olmesartan  (BENICAR ) 40 MG tablet [id number] No TAKE 1 TABLET BY MOUTH EVERY DAY Rollene Almarie LABOR, MD Taking Active   ondansetron  (ZOFRAN -ODT) 4 MG disintegrating tablet [id number] No TAKE 1 TABLET BY MOUTH EVERY 8 HOURS AS NEEDED FOR NAUSEA AND VOMITING Rollene Almarie LABOR, MD Taking Active   potassium chloride  (KLOR-CON ) 10 MEQ tablet [id number] No TAKE 1 TABLET BY MOUTH EVERY DAY Lanny Callander, MD Taking Active   predniSONE  (DELTASONE ) 20 MG tablet [id number]  Take 2 tablets (40 mg total) by mouth daily for 5 days. Small, Brooke L, PA  Active   sennosides-docusate sodium (SENOKOT-S) 8.6-50 MG tablet [id number] No Take 1 tablet by mouth daily as needed for constipation. [provider] Taking Active   triamcinolone  cream (KENALOG ) 0.1 % [id number] No APPLY TO AFFECTED AREA TOPICALLY TWICE A DAY AS NEEDED FOR SKIN IRRITATION/RASH Rollene Almarie LABOR, MD Taking Active             Home Care and Equipment/Supplies: Were Home Health Services Ordered?: NA Any new equipment or medical supplies ordered?: NA  Functional Questionnaire: Do you need assistance with bathing/showering or dressing?: No Do you need assistance with meal preparation?: No Do you need assistance with eating?: No Do you have difficulty maintaining continence: No Do you need assistance with getting out of bed/getting out of a chair/moving?: No Do you have difficulty managing or taking your medications?: No  Follow up appointments reviewed: PCP Follow-up appointment confirmed?: Yes Date of PCP follow-up appointment?: 12/21/23 Follow-up Provider: Dr. Rollene Specialist [facility]  Follow-up appointment confirmed?: NA Do you need transportation to your follow-up appointment?: No Do you understand care options if your condition(s) worsen?: Yes-patient verbalized understanding    SIGNATURE Avelina Essex, CMA (AAMA)  [facility] [phone]

## 2023-12-15 ENCOUNTER — Ambulatory Visit: Payer: Self-pay | Admitting: Internal Medicine

## 2023-12-15 NOTE — Telephone Encounter (Signed)
 Patient called asking if she could take her prescribed anxiety medication after feeling herself becoming anxious. Patient was concerned with all of the medication she is currently on if it would be okay for her to take her anxiety medication. Patient states that she is feeling fine other than a little anxious. Denies SI or HI. Patient was instructed that it was okay to take her medication.  No other concerns at this time.   Copied from CRM [id number]. Topic: Clinical - Medication Question >> Dec 15, 2023  4:25 PM [place] ORN wrote: Reason for CRM: Pt has covid and is on a lot of medication- she is currently very anxious and nervous  on the verge of an anxiety attack- she wants to know if its okay to take her anxiety medication Reason for Disposition  Health Information question, no triage required and triager able to answer question  Answer Assessment - Initial Assessment Questions 1. REASON FOR CALL or QUESTION: What is your reason for calling today? or How can I best help you? or What question do you have that I can help answer?     Patient was calling asking if she could take her anxiety medication with all of the other medication that she is currently on. Patient was instructed that yes she could go ahead and take one of her anxiety medications. Patient states that she has been feeling better but that she felt herself becoming anxious about COVID. NO SI or HI  Protocols used: Information Only Call - No Triage-A-AH

## 2023-12-15 NOTE — Telephone Encounter (Signed)
 Created in error

## 2023-12-17 ENCOUNTER — Other Ambulatory Visit: Payer: Self-pay | Admitting: Internal Medicine

## 2023-12-17 ENCOUNTER — Telehealth: Payer: Self-pay | Admitting: *Deleted

## 2023-12-17 NOTE — Progress Notes (Signed)
Complex Care Management Note  Care Guide Note 12/17/2023 Name: SANYAH HULL MRN: [id number] DOB: 1933-07-14  Shireen Quan Rennels is a 89 y.o. year old female who sees Myrlene Broker, MD for primary care. I reached out to [employer] by phone today to offer complex care management services.  Ms. Krenz was given information about Complex Care Management services today including:   The Complex Care Management services include support from the care team which includes your Nurse Coordinator, Clinical Social Worker, or Pharmacist.  The Complex Care Management team is here to help remove barriers to the health concerns and goals most important to you. Complex Care Management services are voluntary, and the patient may decline or stop services at any time by request to their care team member.   Complex Care Management Consent Status: Patient agreed to services and verbal consent obtained.   Follow up plan:  Telephone appointment with complex care management team member scheduled for:  12/18/2023  Encounter Outcome:  Patient Scheduled  Burman Nieves, CMA, Care Guide [facility]  [facility], [facility] Guide Direct Dial: [phone]  Fax: [phone] Website: [email or web address]

## 2023-12-18 ENCOUNTER — Ambulatory Visit: Payer: Self-pay | Admitting: Licensed Clinical Social Worker

## 2023-12-18 NOTE — Patient Outreach (Signed)
  Care Coordination   Initial Visit Note   12/18/2023 Name: Jamie [name] MRN: [id number] DOB: 03-15-1933  Jamie [name] is a 89 y.o. year old female who sees Myrlene Broker, MD for primary care. I spoke with  Jamie [name] by phone today.  What matters to the patients health and wellness today?  Continuing to manage my anxiety symptoms, living my best life possible.    Goals Addressed   None     SDOH assessments and interventions completed:  Yes  SDOH Interventions Today    Flowsheet Row Most Recent Value  SDOH Interventions   Food Insecurity Interventions Intervention Not Indicated  Housing Interventions Intervention Not Indicated  Transportation Interventions Intervention Not Indicated  Utilities Interventions Intervention Not Indicated  Stress Interventions Intervention Not Indicated  Social Connections Interventions Intervention Not Indicated        Care Coordination Interventions:  Yes, provided   Interventions Today    Flowsheet Row Most Recent Value  Chronic Disease   Chronic disease during today's visit Other  [Anxiety]  General Interventions   General Interventions Discussed/Reviewed Community Resources  [CSW completed [facility] assessment with pt, pt denied any needs regarding housing, finances or food resources. CSW updated pt on community resources that are available if needs come up.]  Education Interventions   Education Provided Provided Education  [We discussed monitoring her anxiety and if her regular coping skills are not effective, then she should reach out to PCP office/careguide team for a assistance.]  Mental Health Interventions   Mental Health Discussed/Reviewed Mental Health Discussed, Mental Health Reviewed, Coping Strategies, Anxiety  [CSW and pt discussed her MH history - pt reported she ocassionally has anxiety regarding her health and other family stressors. Pt stated her anxiety is well controlled with medication. Pt also  leans on her family/faith to help her with her anxiety.]        Follow up plan: No further intervention required.   Encounter Outcome:  Patient Visit Completed   Kenton Kingfisher, LCSW [facility], [facility] Licensed Clinical Social Worker Care Coordinator [phone]

## 2023-12-18 NOTE — Patient Instructions (Signed)
Visit Information  Thank you for taking time to visit with me today. Please don't hesitate to contact me if I can be of assistance to you.   Following are the goals we discussed today:   Goals Addressed   None     Please call the care guide team at [phone] if you need to cancel or reschedule your appointment.   If you are experiencing a Mental Health or Behavioral Health Crisis or need someone to talk to, please call the Suicide and Crisis Lifeline: 988  Patient verbalizes understanding of instructions and care plan provided today and agrees to view in MyChart. Active MyChart status and patient understanding of how to access instructions and care plan via MyChart confirmed with patient.     No further follow up required: Please reach out to care guide team to help reconnect if needed.

## 2023-12-20 ENCOUNTER — Other Ambulatory Visit: Payer: Self-pay | Admitting: Internal Medicine

## 2023-12-21 ENCOUNTER — Ambulatory Visit (INDEPENDENT_AMBULATORY_CARE_PROVIDER_SITE_OTHER): Payer: Medicare PPO | Admitting: Internal Medicine

## 2023-12-21 ENCOUNTER — Encounter: Payer: Self-pay | Admitting: Internal Medicine

## 2023-12-21 VITALS — BP 140/60 | HR 98 | Temp 98.8°F | Ht 63.0 in | Wt 176.0 lb

## 2023-12-21 DIAGNOSIS — J189 Pneumonia, unspecified organism: Secondary | ICD-10-CM | POA: Diagnosis not present

## 2023-12-21 DIAGNOSIS — J4530 Mild persistent asthma, uncomplicated: Secondary | ICD-10-CM | POA: Diagnosis not present

## 2023-12-21 DIAGNOSIS — I10 Essential (primary) hypertension: Secondary | ICD-10-CM

## 2023-12-21 DIAGNOSIS — E782 Mixed hyperlipidemia: Secondary | ICD-10-CM

## 2023-12-21 MED ORDER — MUPIROCIN 2 % EX OINT: 1.0000 | TOPICAL_OINTMENT | Freq: Two times a day (BID) | CUTANEOUS | 0 refills | Status: DC

## 2023-12-21 NOTE — Assessment & Plan Note (Signed)
I am unsure if she truly had pneumonia with small atelectasis versus effusion LLL. She was treated with doxycycline and amoxicillin and almost done with doxycycline. She will finish that and stop amoxicillin as she is having stomach problems and is improving. Lungs are clear. I suspect this was just covid-19 and she has been adequately covered.

## 2023-12-21 NOTE — Assessment & Plan Note (Signed)
No flare today and using albuterol. Oxygen levels good and no fevers or SOB. Continue albuterol as needed.

## 2023-12-21 NOTE — Assessment & Plan Note (Signed)
BP borderline today and she was off amlodipine for paxlovid treatment. She is taking 1/2 pill amlodipine currently and asked her to resume 1 pill daily tomorrow of amlodipine.

## 2023-12-21 NOTE — Patient Instructions (Addendum)
We will have you take the last doxycycline tonight then stop taking the other antibiotic amoxicillin. The amoxicillin is likely bothering your stomach.  We have sent in mupirocin ointment to use on the nose twice a day until this is better that will keep it from getting infected.   You can start taking amlodipine 1 pill daily starting tomorrow.   You can start the statin again on Wednesday this week.

## 2023-12-21 NOTE — Assessment & Plan Note (Addendum)
Confirmed when she is able to restart her lipitor given recent paxlovid she will restart on 12/23/23.

## 2023-12-21 NOTE — Progress Notes (Signed)
   Subjective:   Patient ID: Jamie [name], female    DOB: 1933/07/30, 89 y.o.   MRN: [id number]  HPI The patient is an 89 YO female coming in for ER follow up (diagnosed with covid-19 and treated with paxlovid and treated for possible PNA on portable CXR). She is having some stomach problems with the medicines. She is still taking the 2 antibiotics and is almost done with them. She denies fevers or chills. SOB is not present. Still coughing but improving. Does have a small sore on left nostril from blowing/wiping often.  Review of Systems  Constitutional:  Positive for appetite change and fatigue.  HENT:  Positive for congestion and postnasal drip.   Eyes: Negative.   Respiratory:  Positive for cough. Negative for chest tightness and shortness of breath.   Cardiovascular:  Negative for chest pain, palpitations and leg swelling.  Gastrointestinal:  Negative for abdominal distention, abdominal pain, constipation, diarrhea, nausea and vomiting.  Musculoskeletal: Negative.   Skin: Negative.   Neurological: Negative.   Psychiatric/Behavioral: Negative.      Objective:  Physical Exam Constitutional:      Appearance: She is well-developed.  HENT:     Head: Normocephalic and atraumatic.  Cardiovascular:     Rate and Rhythm: Normal rate and regular rhythm.  Pulmonary:     Effort: Pulmonary effort is normal. No respiratory distress.     Breath sounds: Normal breath sounds. No wheezing or rales.  Abdominal:     General: Bowel sounds are normal. There is no distension.     Palpations: Abdomen is soft.     Tenderness: There is no abdominal tenderness. There is no rebound.  Musculoskeletal:     Cervical back: Normal range of motion.  Skin:    General: Skin is warm and dry.  Neurological:     Mental Status: She is alert and oriented to person, place, and time.     Coordination: Coordination abnormal.     Comments: wheelchair     Vitals:   12/21/23 0950 12/21/23 0953  BP: (!)  140/60 (!) 140/60  Pulse: 98   Temp: 98.8 F (37.1 C)   TempSrc: Oral   SpO2: 90%   Weight: 176 lb (79.8 kg)   Height: 5\' 3"  (1.6 m)     Assessment & Plan:

## 2023-12-24 ENCOUNTER — Telehealth: Payer: Self-pay | Admitting: Internal Medicine

## 2023-12-24 NOTE — Telephone Encounter (Signed)
Copied from CRM [id number]. Topic: Clinical - Prescription Issue >> Dec 24, 2023  1:42 PM Samuel Jester B wrote: Reason for CRM: Pt stated that she didn't receive her BREO ELLIPTA 100-25 MCG/ACT AEPB but she received " Fluticasone-Vilanterol 100-25"  and she is confused on why. She stated that she was instructed to get the letter to her PCP, and said her son can drop the letter off tomorrow morning.

## 2023-12-25 NOTE — Telephone Encounter (Signed)
Patient's son dropped off document  the letter discussed previously , to be filled out by provider. Patient requested to send it back via Call Patient to pick up within 7-days. Document is located in providers tray at front office.Please advise at Mobile [phone] (mobile)

## 2023-12-25 NOTE — Telephone Encounter (Signed)
These are the same thing I am unclear what the question is here for me

## 2023-12-25 NOTE — Telephone Encounter (Signed)
Placed inside box its regarding this medication

## 2023-12-28 NOTE — Telephone Encounter (Signed)
This is a letter telling her this medication is not covered under her plan. She needs to check with her plan to see which inhalers are covered for her and let us know. If she is unsure she should schedule a visit with pharmacist and bring her coverage information to that visit so we can help find an alternative for her.

## 2024-01-01 NOTE — Telephone Encounter (Signed)
Please see my documentation. The letter given to [place] by them was stating that breo was not covered. She was given a 30 day and would need to change before she runs out. I have nothing further to add

## 2024-01-01 NOTE — Telephone Encounter (Signed)
Spoke with patient and she stated that breo is covered through insurance

## 2024-01-01 NOTE — Telephone Encounter (Signed)
Copied from CRM [id number]. Topic: General - Other >> Jan 01, 2024 11:33 AM Florestine Avers wrote: Reason for CRM: Patient sent doctor a letter over to the doctor and she would like to speak to her and provide her additional information. Patient is requesting a call back from Dr. Frutoso Chase' nurse.

## 2024-01-04 ENCOUNTER — Other Ambulatory Visit: Payer: Self-pay | Admitting: Internal Medicine

## 2024-01-04 DIAGNOSIS — F419 Anxiety disorder, unspecified: Secondary | ICD-10-CM

## 2024-01-04 NOTE — Telephone Encounter (Signed)
I have called patient and informed her that the breo is not covered

## 2024-01-06 ENCOUNTER — Ambulatory Visit: Payer: Medicare PPO | Admitting: Internal Medicine

## 2024-01-08 ENCOUNTER — Ambulatory Visit: Payer: Medicare PPO | Attending: Internal Medicine | Admitting: Internal Medicine

## 2024-01-08 VITALS — BP 128/58 | HR 80 | Ht 63.0 in | Wt 174.0 lb

## 2024-01-08 DIAGNOSIS — I700 Atherosclerosis of aorta: Secondary | ICD-10-CM

## 2024-01-08 DIAGNOSIS — I10 Essential (primary) hypertension: Secondary | ICD-10-CM

## 2024-01-08 DIAGNOSIS — R931 Abnormal findings on diagnostic imaging of heart and coronary circulation: Secondary | ICD-10-CM | POA: Diagnosis not present

## 2024-01-08 DIAGNOSIS — E782 Mixed hyperlipidemia: Secondary | ICD-10-CM

## 2024-01-08 DIAGNOSIS — R011 Cardiac murmur, unspecified: Secondary | ICD-10-CM

## 2024-01-08 DIAGNOSIS — C180 Malignant neoplasm of cecum: Secondary | ICD-10-CM | POA: Diagnosis not present

## 2024-01-08 NOTE — Progress Notes (Signed)
 Cardiology Office Note:  .    Date:  01/08/2024  ID:  Jamie [name], DOB 1933-04-11, MRN [id number] PCP: Rollene Almarie LABOR, MD  [facility] Providers Cardiologist:  Stanly LABOR Leavens, MD     CC: CAC prevention follow up  History of Present Illness: .    Jamie [name] is a 89 y.o. female with a hx of HTN, HLD, aortic atherosclerosis, found to have aortic atherosclerosis and CAC, prior hx of PE, prior colon cancer. 2023: Worried about CAC scan; worked of HLD and had negative stress test. 2024: Asymptomatic.     Discussed the use of AI scribe software for clinical note transcription with the patient, who gave verbal consent to proceed.  Jamie [name] is an 89 year old female with coronary artery calcifications, aortic atherosclerosis, and pericardial calcification who presents for routine follow-up.  She has a history of coronary artery calcifications, aortic atherosclerosis, and incidental pericardial calcification. She feels generally well with no significant changes in her health since her last visit. No new chest pain, chest pressure, chest tightness, or chest stinging. Rare breathing issues and rare swelling in her legs.  In 2023, she underwent a stress test to evaluate her heart function, which showed no blood flow issues through the blockages.  She is currently on amlodipine  for blood pressure management, which is well-controlled. She also takes Lasix  (furosemide ) for fluid management, noting only rare ankle swelling. No significant weight gain or persistent shortness of breath, although she experiences mild shortness of breath after prolonged walking.  She uses two inhalers and mentions that she can go about eight hours before needing to use them.  She continues to engage in daily activities such as cooking and reports being able to maintain her usual routine.  Her cholesterol is well-managed with medication, maintaining a target of a bad  cholesterol level of seventy or less.  Relevant histories: .  Social - does full ADLs - Enjoys cooking - Spends time doing housework ROS: As per HPI.   Studies Reviewed: .   Cardiac Studies & Procedures     STRESS TESTS  MYOCARDIAL PERFUSION IMAGING 07/16/2022  Narrative   The study is normal. The study is low risk.   No ST deviation was noted.   Left ventricular function is normal. Nuclear stress EF: 69 %. The left ventricular ejection fraction is hyperdynamic (>65%). End diastolic cavity size is normal.   Prior study not available for comparison.     CT SCANS  CT CARDIAC SCORING (SELF PAY ONLY) 04/17/2022  Addendum 04/17/2022  2:12 PM ADDENDUM REPORT: 04/17/2022 14:09  EXAM: OVER-READ INTERPRETATION  CT CHEST  The following report is an over-read performed by radiologist Dr. MYRTIS [facility] on 04/17/2022. This over-read does not include interpretation of cardiac or coronary anatomy or pathology. The cardiac interpretation by the cardiologist is attached.  COMPARISON:  Chest CT 11/13/2021  FINDINGS: The thoracic aorta is normal in caliber.  No pericardial effusion.  No mediastinal or hilar mass or lymphadenopathy. The visualized esophagus is grossly.  No acute pulmonary findings or worrisome pulmonary lesions. The central tracheobronchial tree is unremarkable. No pleural effusion.  No significant upper abdominal findings.  The bony structures are intact.  IMPRESSION: No significant findings extracardiac findings.   Electronically Signed By: MYRTIS Stammer M.D. On: 04/17/2022 14:09  Narrative CLINICAL DATA:  Risk stratification: 89 year old African American Female  EXAM: Coronary Calcium  Score  TECHNIQUE: The patient was scanned on a Siemens  Force scanner. Axial non-contrast 3 mm slices were carried out through the heart. The data set was analyzed on a dedicated work station and scored using the Agatson  method.  FINDINGS: Non-cardiac: See separate report from [facility].  Ascending Aorta: Normal caliber.  Aortic atherosclerosis.  Pericardium: There is mild pericardial calcification posterior to the anterolateral wall of the left ventricle.  Coronary arteries: Normal origins.  Coronary Calcium  Score:  Left main: 12  Left anterior descending artery: 864  Left circumflex artery: 742  Right coronary artery: 546  Total: 2164  IMPRESSION: 1. Coronary calcium  score of 2164- elevated for age and gender.  2.  Aortic atherosclerosis.  3.  Mild pericardial calcification.  RECOMMENDATIONS:  Coronary artery calcium  (CAC) score is a strong predictor of incident coronary heart disease (CHD) and provides predictive information beyond traditional risk factors. CAC scoring is reasonable to use in the decision to withhold, postpone, or initiate statin therapy in intermediate-risk or selected borderline-risk asymptomatic adults (age 29-75 years and LDL-C >=70 to <190 mg/dL) who do not have diabetes or established atherosclerotic cardiovascular disease (ASCVD).* In intermediate-risk (10-year ASCVD risk >=7.5% to <20%) adults or selected borderline-risk (10-year ASCVD risk >=5% to <7.5%) adults in whom a CAC score is measured for the purpose of making a treatment decision the following recommendations have been made:  If CAC = 0, it is reasonable to withhold statin therapy and reassess in 5 to 10 years, as long as higher risk conditions are absent (diabetes mellitus, family history of premature CHD in first degree relatives (males <55 years; females <65 years), cigarette smoking, LDL >=190 mg/dL or other independent risk factors).  If CAC is 1 to 99, it is reasonable to initiate statin therapy for patients >=60 years of age.  If CAC is >=100 or >=75th percentile, it is reasonable to initiate statin therapy at any age.  Cardiology referral should be considered for  patients with CAC scores =400 or >=75th percentile.  *2018 AHA/ACC/AACVPR/AAPA/ABC/ACPM/ADA/AGS/APhA/ASPC/NLA/PCNA Guideline on the Management of Blood Cholesterol: A Report of the American College of Cardiology/American Heart Association Task Force on Clinical Practice Guidelines. J Am Coll Cardiol. 2019;73(24):3168-3209.  Stanly Leavens, MD  Electronically Signed: By: Stanly Leavens M.D. On: 04/17/2022 13:17           Physical Exam:    VS:  BP (!) 128/58 (BP Location: Left Arm)   Pulse 80   Ht 5' 3 (1.6 m)   Wt 174 lb (78.9 kg)   SpO2 98%   BMI 30.82 kg/m    Wt Readings from Last 3 Encounters:  01/08/24 174 lb (78.9 kg)  12/21/23 176 lb (79.8 kg)  12/11/23 172 lb (78 kg)    Gen: no distress Neck: No JVD Cardiac: No Rubs or Gallops, new systolic Murmur, RRR +2 radial pulses Respiratory: Clear to auscultation bilaterally, normal effort, normal  respiratory rate GI: Soft, nontender, non-distended  MS: No  edema;  moves all extremities Integument: Skin feels warm Neuro:  At time of evaluation, alert and oriented to person/place/time/situation  Psych: Normal affect, patient feels well   ASSESSMENT AND PLAN: .    Coronary Artery Calcifications Confirmed by previous imaging. Stress test in 2023 showed no ischemia. Blood pressure well controlled on current medications. Discussed risks of angina and importance of symptom monitoring. - Continue current medications - Monitor for angina symptoms - Annual follow-up unless symptoms arise  Aortic Atherosclerosis Condition well-managed with no new symptoms. Emphasized importance of symptom monitoring. - LDL goal< 70  Pericardial Calcification Incidental finding with no related symptoms. Emphasized importance of symptom monitoring. - Continue current management - no evidence of constriction  Heart Murmur Detected during physical exam. Likely age-related valve calcification. Echocardiogram needed for  further evaluation. - Order echocardiogram   Hypertension Blood pressure well controlled on amlodipine  and Benicar . Emphasized importance of medication adherence and regular monitoring.  General Health Maintenance 89 years old, generally in good health, compliant with medications. Emphasized importance of routine health maintenance. - Annual follow-up unless symptoms arise  Follow-up - Schedule annual follow-up appointment - Monitor for new symptoms and contact physician if she occurs.  Stanly Leavens, MD [facility] Cardiologist [facility]  [address] [place], [address] [place], [place] [address] [phone]  9:00 AM

## 2024-01-08 NOTE — Patient Instructions (Signed)
 Medication Instructions:  Your physician recommends that you continue on your current medications as directed. Please refer to the Current Medication list given to you today.  *If you need a refill on your cardiac medications before your next appointment, please call your pharmacy*   Lab Work: NONE  If you have labs (blood work) drawn today and your tests are completely normal, you will receive your results only by: MyChart Message (if you have MyChart) OR A paper copy in the mail If you have any lab test that is abnormal or we need to change your treatment, we will call you to review the results.   Testing/Procedures: Your physician has requested that you have an echocardiogram. Echocardiography is a painless test that uses sound waves to create images of your heart. It provides your doctor with information about the size and shape of your heart and how well your heart's chambers and valves are working. This procedure takes approximately one hour. There are no restrictions for this procedure. Please do NOT wear cologne, perfume, aftershave, or lotions (deodorant is allowed). Please arrive 15 minutes prior to your appointment time.  Please note: We ask at that you not bring children with you during ultrasound (echo/ vascular) testing. Due to room size and safety concerns, children are not allowed in the ultrasound rooms during exams. Our front office staff cannot provide observation of children in our lobby area while testing is being conducted. An adult accompanying a patient to their appointment will only be allowed in the ultrasound room at the discretion of the ultrasound technician under special circumstances. We apologize for any inconvenience.    Follow-Up: At [facility], you and your health needs are our priority.  As part of our continuing mission to provide you with exceptional heart care, we have created designated Provider Care Teams.  These Care Teams include your  primary Cardiologist (physician) and Advanced Practice Providers (APPs -  Physician Assistants and Nurse Practitioners) who all work together to provide you with the care you need, when you need it.    Your next appointment:   1 year(s)  Provider:   Christell Constant, MD

## 2024-01-20 ENCOUNTER — Other Ambulatory Visit: Payer: Self-pay | Admitting: Internal Medicine

## 2024-01-20 ENCOUNTER — Ambulatory Visit: Payer: Self-pay | Admitting: Internal Medicine

## 2024-01-20 NOTE — Telephone Encounter (Signed)
 Pt has refills on file.

## 2024-01-20 NOTE — Telephone Encounter (Signed)
 Voicemail left with patient about refill status of Breo Eillipta. Callback number left for any other concerns   Copied from CRM [id number]. Topic: Clinical - Medication Refill >> Jan 20, 2024  9:02 AM Truddie Crumble wrote: Most Recent Primary Care Visit:  Provider: Hillard Danker A  Department: [facility]  Visit Type: HOSPITAL FU  Date: 12/21/2023  Medication: BREO ELLIPTA 100-25 MCG/ACT AEPB  Has the patient contacted their pharmacy? Yes (Agent: If no, request that the patient contact the pharmacy for the refill. If patient does not wish to contact the pharmacy document the reason why and proceed with request.) (Agent: If yes, when and what did the pharmacy advise?)  Is this the correct pharmacy for this prescription? Yes If no, delete pharmacy and type the correct one.  This is the patient's preferred pharmacy:  [phone], [place] - [address] [place] [address] Phone: [phone] Fax: [phone]   Has the prescription been filled recently? Yes  Is the patient out of the medication? No  Has the patient been seen for an appointment in the last year OR does the patient have an upcoming appointment? Yes  Can we respond through MyChart? Yes  Agent: Please be advised that Rx refills may take up to 3 business days. We ask that you follow-up with your pharmacy.

## 2024-01-20 NOTE — Telephone Encounter (Signed)
 Copied from CRM [id number]. Topic: Clinical - Medication Refill >> Jan 20, 2024  9:02 AM Truddie Crumble wrote: Most Recent Primary Care Visit:  Provider: Hillard Danker A  Department: [facility]  Visit Type: HOSPITAL FU  Date: 12/21/2023  Medication: BREO ELLIPTA 100-25 MCG/ACT AEPB  Has the patient contacted their pharmacy? Yes (Agent: If no, request that the patient contact the pharmacy for the refill. If patient does not wish to contact the pharmacy document the reason why and proceed with request.) (Agent: If yes, when and what did the pharmacy advise?)  Is this the correct pharmacy for this prescription? Yes If no, delete pharmacy and type the correct one.  This is the patient's preferred pharmacy:  [phone], [place] - [address] [place] [address] Phone: [phone] Fax: [phone]   Has the prescription been filled recently? Yes  Is the patient out of the medication? No  Has the patient been seen for an appointment in the last year OR does the patient have an upcoming appointment? Yes  Can we respond through MyChart? Yes  Agent: Please be advised that Rx refills may take up to 3 business days. We ask that you follow-up with your pharmacy.

## 2024-01-22 ENCOUNTER — Telehealth: Payer: Self-pay | Admitting: Internal Medicine

## 2024-01-22 NOTE — Telephone Encounter (Signed)
 Copied from CRM [id number]. Topic: Clinical - Medication Refill >> Jan 22, 2024 12:29 PM Louie Casa B wrote: Most Recent Primary Care Visit:  Provider: Hillard Danker A  Department: [facility]  Visit Type: HOSPITAL FU  Date: 12/21/2023  Medication: BREO ELLIPTA 100-25 MCG/ACT AEPB  Has the patient contacted their pharmacy? Yes (Agent: If no, request that the patient contact the pharmacy for the refill. If patient does not wish to contact the pharmacy document the reason why and proceed with request.) (Agent: If yes, when and what did the pharmacy advise?)  Is this the correct pharmacy for this prescription? Yes If no, delete pharmacy and type the correct one.  This is the patient's preferred pharmacy:  [phone], [place] - [address] [place] [address] Phone: [phone] Fax: [phone]   Has the prescription been filled recently? Yes  Is the patient out of the medication? Yes  Has the patient been seen for an appointment in the last year OR does the patient have an upcoming appointment? Yes  Can we respond through MyChart? Yes  Agent: Please be advised that Rx refills may take up to 3 business days. We ask that you follow-up with your pharmacy.

## 2024-01-22 NOTE — Telephone Encounter (Signed)
 [facility] and spoke to [place], [facility] about the refill(s) Earlie Server requested. Advised it was sent on 12/17/23 #60/4 refill(s). Advised she's been calling for the past few days regarding this refill. He says he's getting a rejection code: rejection drug not on formulary. He says the provider will need to send in a new Rx with an alternative inhaler. Then he ran it for the brand name and it went through for a co-pay of $40.

## 2024-01-26 ENCOUNTER — Ambulatory Visit (HOSPITAL_COMMUNITY): Payer: Medicare PPO | Attending: Cardiovascular Disease

## 2024-01-26 DIAGNOSIS — I10 Essential (primary) hypertension: Secondary | ICD-10-CM

## 2024-01-26 DIAGNOSIS — R011 Cardiac murmur, unspecified: Secondary | ICD-10-CM | POA: Diagnosis not present

## 2024-01-26 LAB — ECHOCARDIOGRAM COMPLETE
Area-P 1/2: 3.53 cm2
S' Lateral: 2.6 cm

## 2024-02-04 ENCOUNTER — Other Ambulatory Visit: Payer: Self-pay | Admitting: Internal Medicine

## 2024-02-25 ENCOUNTER — Other Ambulatory Visit: Payer: Self-pay | Admitting: Internal Medicine

## 2024-02-26 ENCOUNTER — Encounter: Payer: Self-pay | Admitting: Podiatry

## 2024-02-26 ENCOUNTER — Ambulatory Visit: Admitting: Podiatry

## 2024-02-26 DIAGNOSIS — B351 Tinea unguium: Secondary | ICD-10-CM | POA: Diagnosis not present

## 2024-02-26 DIAGNOSIS — T148XXA Other injury of unspecified body region, initial encounter: Secondary | ICD-10-CM | POA: Insufficient documentation

## 2024-02-26 DIAGNOSIS — D689 Coagulation defect, unspecified: Secondary | ICD-10-CM

## 2024-02-26 DIAGNOSIS — M79676 Pain in unspecified toe(s): Secondary | ICD-10-CM

## 2024-02-26 DIAGNOSIS — Q828 Other specified congenital malformations of skin: Secondary | ICD-10-CM

## 2024-02-26 NOTE — Progress Notes (Signed)
 This patient presents to the office with chief complaint of long thick painful nails and painful calluses both feet. Patient says the nails  and callus are painful walking and wearing shoes.  This patient is unable to self treat.  This patient is unable to trim her nails and calluses since she is unable to reach her nails.  She presents to the office for preventative foot care services.  General Appearance  Alert, conversant and in no acute stress.  Vascular  Dorsalis pedis and posterior tibial  pulses are  weakly palpable  bilaterally.  Capillary return is within normal limits  bilaterally. Temperature is within normal limits  bilaterally.  Neurologic  Senn-Weinstein monofilament wire test within normal limits  bilaterally. Muscle power within normal limits bilaterally.  Nails Thick disfigured discolored nails with subungual debris  from hallux to fifth toes bilaterally. No evidence of bacterial infection or drainage bilaterally.  Orthopedic  No limitations of motion  feet .  No crepitus or effusions noted.  No bony pathology or digital deformities noted.  Skin  normotropic skin with no porokeratosis noted bilaterally.  No signs of infections or ulcers noted.   Porokeratosis sub 2 and heel left foot.  Porokeratosis right heel.   Onychomycosis  Nails  B/L.  Pain in right toes  Pain in left toes Porokeratosis  B/L.  Debridement of nails both feet followed trimming the nails with dremel tool. Debride callus with # 15 blade and dremel tool.  During my care she had a hematoma under her left hallux as nail was debrided.  Her toe was bandaged with neosporin/DSD.  Told her to peroxide toe at home and bandage her toe.  If problem persists this patient should call the office.   RTC 3 months.   Helane Gunther DPM

## 2024-03-21 ENCOUNTER — Other Ambulatory Visit: Payer: Self-pay | Admitting: Internal Medicine

## 2024-03-28 ENCOUNTER — Encounter: Payer: Self-pay | Admitting: Internal Medicine

## 2024-03-28 ENCOUNTER — Ambulatory Visit (INDEPENDENT_AMBULATORY_CARE_PROVIDER_SITE_OTHER): Payer: Medicare PPO | Admitting: Internal Medicine

## 2024-03-28 VITALS — BP 114/60 | HR 73 | Temp 98.7°F | Ht 62.0 in | Wt 178.0 lb

## 2024-03-28 DIAGNOSIS — I10 Essential (primary) hypertension: Secondary | ICD-10-CM

## 2024-03-28 DIAGNOSIS — Z0000 Encounter for general adult medical examination without abnormal findings: Secondary | ICD-10-CM | POA: Diagnosis not present

## 2024-03-28 DIAGNOSIS — Z85038 Personal history of other malignant neoplasm of large intestine: Secondary | ICD-10-CM | POA: Diagnosis not present

## 2024-03-28 DIAGNOSIS — D649 Anemia, unspecified: Secondary | ICD-10-CM

## 2024-03-28 DIAGNOSIS — G8929 Other chronic pain: Secondary | ICD-10-CM

## 2024-03-28 DIAGNOSIS — J4530 Mild persistent asthma, uncomplicated: Secondary | ICD-10-CM

## 2024-03-28 DIAGNOSIS — I700 Atherosclerosis of aorta: Secondary | ICD-10-CM

## 2024-03-28 DIAGNOSIS — E782 Mixed hyperlipidemia: Secondary | ICD-10-CM | POA: Diagnosis not present

## 2024-03-28 DIAGNOSIS — M545 Low back pain, unspecified: Secondary | ICD-10-CM

## 2024-03-28 LAB — CBC
HCT: 35.9 % — ABNORMAL LOW (ref 36.0–46.0)
Hemoglobin: 11.9 g/dL — ABNORMAL LOW (ref 12.0–15.0)
MCHC: 33.3 g/dL (ref 30.0–36.0)
MCV: 96.6 fl (ref 78.0–100.0)
Platelets: 193 10*3/uL (ref 150.0–400.0)
RBC: 3.71 Mil/uL — ABNORMAL LOW (ref 3.87–5.11)
RDW: 13.9 % (ref 11.5–15.5)
WBC: 5.2 10*3/uL (ref 4.0–10.5)

## 2024-03-28 LAB — LIPID PANEL
Cholesterol: 121 mg/dL (ref 0–200)
HDL: 48.8 mg/dL (ref >39.00)
LDL Cholesterol: 63 mg/dL (ref 0–99)
NonHDL: 72.67
Total CHOL/HDL Ratio: 2
Triglycerides: 48 mg/dL (ref 0.0–149.0)
VLDL: 9.6 mg/dL (ref 0.0–40.0)

## 2024-03-28 LAB — COMPREHENSIVE METABOLIC PANEL WITH GFR
ALT: 12 U/L (ref 0–35)
AST: 14 U/L (ref 0–37)
Albumin: 4.1 g/dL (ref 3.5–5.2)
Alkaline Phosphatase: 147 U/L — ABNORMAL HIGH (ref 39–117)
BUN: 16 mg/dL (ref 6–23)
CO2: 27 meq/L (ref 19–32)
Calcium: 9.3 mg/dL (ref 8.4–10.5)
Chloride: 109 meq/L (ref 96–112)
Creatinine, Ser: 0.9 mg/dL (ref 0.40–1.20)
GFR: 56.85 mL/min — ABNORMAL LOW (ref >60.00)
Glucose, Bld: 99 mg/dL (ref 70–99)
Potassium: 4 meq/L (ref 3.5–5.1)
Sodium: 142 meq/L (ref 135–145)
Total Bilirubin: 0.6 mg/dL (ref 0.2–1.2)
Total Protein: 7.2 g/dL (ref 6.0–8.3)

## 2024-03-28 NOTE — Patient Instructions (Signed)
 You are due for a tetanus booster at the pharmacy.

## 2024-03-28 NOTE — Progress Notes (Unsigned)
   Subjective:   Patient ID: Jamie [name], female    DOB: [birth year], 89 y.o.   MRN: [id number]  HPI The patient is here for physical.  PMH, [facility], social history reviewed and updated  Review of Systems  Objective:  Physical Exam  Vitals:   03/28/24 0912  BP: 114/60  Pulse: 73  Temp: 98.7 F (37.1 C)  TempSrc: Oral  SpO2: 98%  Weight: 178 lb (80.7 kg)  Height: 5\' 2"  (1.575 m)    Assessment & Plan:

## 2024-03-30 NOTE — Assessment & Plan Note (Signed)
Checking lipid panel and adjust lipitor as needed.  

## 2024-03-30 NOTE — Assessment & Plan Note (Signed)
 Refilled breo. No flare today.

## 2024-03-30 NOTE — Assessment & Plan Note (Signed)
 No further colonoscopy planned and s/p surgery 2021.

## 2024-03-30 NOTE — Assessment & Plan Note (Signed)
Taking lipitor and will continue.

## 2024-03-30 NOTE — Assessment & Plan Note (Signed)
Flu shot yearly. Pneumonia complete. Shingrix complete. Tetanus due at pharmacy. Colonoscopy aged out. Mammogram aged out, pap smear aged out and dexa complete. Counseled about sun safety and mole surveillance. Counseled about the dangers of distracted driving. Given 10 year screening recommendations.

## 2024-03-30 NOTE — Assessment & Plan Note (Signed)
 Going on for 1 month and stable. Suspect arthritis. She will let us  know if not improving we can order x-ray.

## 2024-03-30 NOTE — Assessment & Plan Note (Signed)
 Checking CBC and adjust as needed.

## 2024-03-30 NOTE — Assessment & Plan Note (Signed)
 BP at goal on amlodipine  and olmesartan  and lasix  . Checking CMP and adjust as needed.

## 2024-04-14 ENCOUNTER — Other Ambulatory Visit: Payer: Self-pay | Admitting: Internal Medicine

## 2024-04-14 MED ORDER — AMLODIPINE BESYLATE 5 MG PO TABS: 5.0000 mg | ORAL_TABLET | Freq: Every day | ORAL | 1 refills | Status: DC

## 2024-04-14 NOTE — Telephone Encounter (Signed)
 Copied from CRM [id number]. Topic: Clinical - Medication Refill >> Apr 14, 2024  2:37 PM Chuck Crater wrote: Medication: amLODipine  (NORVASC ) 5 MG tablet  Has the patient contacted their pharmacy? Yes (Agent: If no, request that the patient contact the pharmacy for the refill. If patient does not wish to contact the pharmacy document the reason why and proceed with request.) (Agent: If yes, when and what did the pharmacy advise?) No refills  This is the patient's preferred pharmacy:  [phone] Jonette Nestle, [place] - [address] [place] [address] Phone: [phone] Fax: [phone]  Is this the correct pharmacy for this prescription? Yes If no, delete pharmacy and type the correct one.   Has the prescription been filled recently? Yes last fill was 16 pills on 04/25  Is the patient out of the medication? Yes took last pill this morning  Has the patient been seen for an appointment in the last year OR does the patient have an upcoming appointment? Yes  Can we respond through MyChart? Yes  Agent: Please be advised that Rx refills may take up to 3 business days. We ask that you follow-up with your pharmacy.

## 2024-05-04 ENCOUNTER — Ambulatory Visit (INDEPENDENT_AMBULATORY_CARE_PROVIDER_SITE_OTHER)

## 2024-05-04 VITALS — Ht 62.0 in | Wt 178.0 lb

## 2024-05-04 DIAGNOSIS — Z780 Asymptomatic menopausal state: Secondary | ICD-10-CM

## 2024-05-04 DIAGNOSIS — Z0000 Encounter for general adult medical examination without abnormal findings: Secondary | ICD-10-CM | POA: Diagnosis not present

## 2024-05-04 NOTE — Progress Notes (Signed)
 Subjective:   Jamie [name] is a 89 y.o. who presents for a Medicare Wellness preventive visit.  As a reminder, Annual Wellness Visits don't include a physical exam, and some assessments may be limited, especially if this visit is performed virtually. We may recommend an in-person follow-up visit with your provider if needed.  Visit Complete: Virtual I connected with  Jamie [name] on 05/04/24 by a audio enabled telemedicine application and verified that I am speaking with the correct person using two identifiers.  Patient Location: Home  Provider Location: Home Office  I discussed the limitations of evaluation and management by telemedicine. The patient expressed understanding and agreed to proceed.  Vital Signs: Because this visit was a virtual/telehealth visit, some criteria may be missing or patient reported. Any vitals not documented were not able to be obtained and vitals that have been documented are patient reported.  VideoDeclined- This patient declined Librarian, academic. Therefore the visit was completed with audio only.  Persons Participating in Visit: Patient.  AWV Questionnaire: No: Patient Medicare AWV questionnaire was not completed prior to this visit.  Cardiac Risk Factors include: advanced age (>75men, >30 women);hypertension;dyslipidemia;Other (see comment), Risk factor comments: Aortic atherosclerosis     Objective:     Today's Vitals   05/04/24 1039  Weight: 178 lb (80.7 kg)  Height: 5\' 2"  (1.575 m)   Body mass index is 32.56 kg/m.     05/04/2024   10:53 AM 06/01/2023   10:48 AM 12/17/2022    9:19 AM 03/11/2022   11:17 AM 02/28/2021   11:03 AM 10/17/2020   10:37 AM 12/15/2019    9:22 AM  Advanced Directives  Does Patient Have a Medical Advance Directive? No No No No No No No  Would patient like information on creating a medical advance directive?  No - Patient declined Yes (MAU/Ambulatory/Procedural Areas -  Information given) No - Patient declined No - Patient declined  No - Patient declined    Current Medications (verified) Outpatient Encounter Medications as of 05/04/2024  Medication Sig   acetaminophen  (TYLENOL ) 500 MG tablet Take 500-1,000 mg by mouth every 6 (six) hours as needed (for pain.).   albuterol  (VENTOLIN  HFA) 108 (90 Base) MCG/ACT inhaler INHALE 2 PUFFS BY MOUTH EVERY 4 HOURS AS NEEDED FOR WHEEZE OR FOR SHORTNESS OF BREATH   ALPRAZolam  (XANAX ) 0.5 MG tablet TAKE 1 TABLET BY MOUTH EVERY DAY AS NEEDED FOR ANXIETY   alum & mag hydroxide-simeth (MAALOX/MYLANTA) 200-200-20 MG/5ML suspension Take 15 mLs by mouth every 6 (six) hours as needed for indigestion or heartburn.   amLODipine  (NORVASC ) 5 MG tablet Take 1 tablet (5 mg total) by mouth daily.   atorvastatin  (LIPITOR) 40 MG tablet TAKE 1 TABLET BY MOUTH EVERY DAY   benzonatate  (TESSALON ) 200 MG capsule TAKE 1 CAPSULE (200 MG TOTAL) BY MOUTH 3 (THREE) TIMES DAILY AS NEEDED FOR COUGH.   betamethasone  dipropionate 0.05 % cream APPLY TO AFFECTED AREA TWICE A DAY   BREO ELLIPTA  100-25 MCG/ACT AEPB INHALE 1 PUFF BY MOUTH EVERY DAY   diclofenac  Sodium (VOLTAREN ) 1 % GEL Apply 2 g topically 4 (four) times daily.   ezetimibe  (ZETIA ) 10 MG tablet TAKE 1 TABLET BY MOUTH EVERY DAY   famotidine  (PEPCID ) 20 MG tablet TAKE 1 TABLET BY MOUTH TWICE A DAY   furosemide  (LASIX ) 40 MG tablet TAKE 1 TABLET BY MOUTH EVERY DAY   montelukast  (SINGULAIR ) 10 MG tablet TAKE 1 TABLET BY MOUTH EVERYDAY AT BEDTIME  mupirocin  ointment (BACTROBAN ) 2 % APPLY TO AFFECTED AREA TWICE A DAY   nystatin  cream (MYCOSTATIN ) Apply 1 Application topically 2 (two) times daily. (Patient taking differently: Apply 1 Application topically as needed for dry skin.)   olmesartan  (BENICAR ) 40 MG tablet TAKE 1 TABLET BY MOUTH EVERY DAY   ondansetron  (ZOFRAN -ODT) 4 MG disintegrating tablet TAKE 1 TABLET BY MOUTH EVERY 8 HOURS AS NEEDED FOR NAUSEA AND VOMITING   potassium chloride   (KLOR-CON ) 10 MEQ tablet TAKE 1 TABLET BY MOUTH EVERY DAY   sennosides-docusate sodium (SENOKOT-S) 8.6-50 MG tablet Take 1 tablet by mouth daily as needed for constipation.   triamcinolone  cream (KENALOG ) 0.1 % APPLY TO AFFECTED AREA TOPICALLY TWICE A DAY AS NEEDED FOR SKIN IRRITATION/RASH   No facility-administered encounter medications on file as of 05/04/2024.    Allergies (verified) Aspirin  and Latex   History: Past Medical History:  Diagnosis Date   Adenocarcinoma of cecum (HCC) 09/28/2019   ANEMIA, IRON DEFICIENCY 09/10/2007   Anxiety    Arthritis    Asthma    Blood transfusion without reported diagnosis    Cataract 2008   bilateral cataract extraction   CATARACT EXTRACTION, HX OF 09/10/2007   DYSPEPSIA, HX OF 09/10/2007   GERD (gastroesophageal reflux disease)    HAIR LOSS 03/21/2009   HAY FEVER 09/10/2007   HEARING LOSS, SENSORINEURAL 09/10/2007   HYPERLIPIDEMIA 09/10/2007   HYPERTENSION 09/10/2007   LOW BACK PAIN 05/10/2008   Pain in joint, hand 03/01/2008   PEPTIC ULCER DISEASE 09/10/2007   POLYP, COLON 09/10/2007   POLYPECTOMY, HX OF 09/10/2007   Pulmonary embolism (HCC) 1- 2 non-occlusive 10/10/2019   SHINGLES 08/13/2009   SHOULDER PAIN, LEFT 02/12/2008   TAH/BSO, HX OF 09/10/2007   Tuberculosis    PATIENT DENIES AT PHONE INTERVIEW ON 12/12/2019   Past Surgical History:  Procedure Laterality Date   ABDOMINAL HYSTERECTOMY     BREAST SURGERY     breast biopsy-benign   CATARACT EXTRACTION     COLONOSCOPY     EYE SURGERY     bilateral cataract with lens implant   IRRIGATION AND DEBRIDEMENT SEBACEOUS CYST     LAPAROSCOPIC RIGHT HEMI COLECTOMY Right 12/15/2019   Procedure: LAPAROSCOPIC RIGHT HEMI COLECTOMY WITH LYSIS OF ADHESIONS;  Surgeon: Melvenia Stabs, MD;  Location: [facility];  Service: General;  Laterality: Right;   TOTAL ABDOMINAL HYSTERECTOMY W/ BILATERAL SALPINGOOPHORECTOMY     Family History  Problem Relation Age of Onset   Hypertension Mother     Coronary artery disease Mother    Heart disease Mother        CHF   Cancer Son 6       prostate cancer    Cancer Son 34       prostate cancer   Diabetes Neg Hx    Social History   Socioeconomic History   Marital status: Widowed    Spouse name: Not on file   Number of children: 4   Years of education: Not on file   Highest education level: Not on file  Occupational History   Occupation: retired  Tobacco Use   Smoking status: Former    Current packs/day: 0.00    Average packs/day: 0.2 packs/day for 25.0 years (5.0 ttl pk-yrs)    Types: Cigarettes    Start date: 12/01/1964    Quit date: 12/01/1989    Years since quitting: 34.4   Smokeless tobacco: Never   Tobacco comments:    smoked a long time ago; Quit  90 or 91/ number is approximate  Vaping Use   Vaping status: Never Used  Substance and Sexual Activity   Alcohol use: No    Alcohol/week: 0.0 standard drinks of alcohol   Drug use: No   Sexual activity: Not Currently  Other Topics Concern   Not on file  Social History Narrative   3 sons, 58,59, 24 and 1 daughter 83, 4 grandchildrenSO with multiple medical problems      1 daughter and 2 sons/2025   Social Drivers of Corporate investment banker Strain: Low Risk  (06/01/2023)   Overall Financial Resource Strain (CARDIA)    Difficulty of Paying Living Expenses: Not very hard  Food Insecurity: No Food Insecurity (12/18/2023)   Hunger Vital Sign    Worried About Running Out of Food in the Last Year: Never true    Ran Out of Food in the Last Year: Never true  Transportation Needs: No Transportation Needs (05/04/2024)   PRAPARE - Administrator, Civil Service (Medical): No    Lack of Transportation (Non-Medical): No  Physical Activity: Inactive (05/04/2024)   Exercise Vital Sign    Days of Exercise per Week: 0 days    Minutes of Exercise per Session: 0 min  Stress: No Stress Concern Present (05/04/2024)   [employer] of Occupational Health - Occupational Stress  Questionnaire    Feeling of Stress : Not at all  Social Connections: Socially Isolated (05/04/2024)   Social Connection and Isolation Panel [NHANES]    Frequency of Communication with Friends and Family: More than three times a week    Frequency of Social Gatherings with Friends and Family: Once a week    Attends Religious Services: Never    Database administrator or Organizations: No    Attends Banker Meetings: Never    Marital Status: Widowed    Tobacco Counseling Counseling given: Not Answered Tobacco comments: smoked a long time ago; Quit 90 or 91/ number is approximate    Clinical Intake:  Pre-visit preparation completed: Yes  Pain : No/denies pain     BMI - recorded: 35.56 Nutritional Status: BMI > 30  Obese Nutritional Risks: None Diabetes: No  Lab Results  Component Value Date   HGBA1C 5.8 06/05/2021   HGBA1C 5.1 12/12/2019   HGBA1C 5.6 09/15/2019     How often do you need to have someone help you when you read instructions, pamphlets, or other written materials from your doctor or pharmacy?: 1 - Never  Interpreter Needed?: No  Information entered by :: Shirelle Tootle, RMA   Activities of Daily Living     05/04/2024   10:40 AM 06/01/2023   10:46 AM  In your present state of health, do you have any difficulty performing the following activities:  Hearing? 0 0  Vision? 0 0  Difficulty concentrating or making decisions? 0 0  Walking or climbing stairs? 0 1  Dressing or bathing? 0 0  Doing errands, shopping? 0 0  Comment her children drives her   Preparing Food and eating ? N N  Using the Toilet? N N  In the past six months, have you accidently leaked urine? N N  Do you have problems with loss of bowel control? N N  Managing your Medications? N N  Managing your Finances? N N  Housekeeping or managing your Housekeeping? N N    Patient Care Team: Adelia Homestead, MD as PCP - General (Internal Medicine) Jann Melody, MD  as  PCP - Cardiology (Cardiology) Melvenia Stabs, MD as Consulting Physician (General Surgery) Sonja [place], MD as Consulting Physician (Hematology) Kenney Peacemaker, MD as Consulting Physician (Gastroenterology) Aminta Kales, MD as Consulting Physician (Ophthalmology)  I have updated your Care Teams any recent Medical Services you may have received from other providers in the past year.     Assessment:    This is a routine wellness examination for Jamie [name].  Hearing/Vision screen Hearing Screening - Comments:: Denies hearing difficulties   Vision Screening - Comments:: Wears eyeglasses/[facility]/Dr. Rosary Como   Goals Addressed             This Visit's Progress    Patient Stated   On track    Stay as healthy and as independent as possible.        Depression Screen     05/04/2024   10:58 AM 03/28/2024    9:19 AM 12/18/2023    9:08 AM 12/18/2023    9:07 AM 06/01/2023   10:51 AM 06/01/2023   10:49 AM 05/18/2023    2:08 PM  PHQ 2/9 Scores  PHQ - 2 Score 0 0 0 0 0 0 0  PHQ- 9 Score 0    0 0 0    Fall Risk     05/04/2024   10:54 AM 03/28/2024    9:19 AM 12/21/2023    9:53 AM 10/14/2023    9:08 AM 06/01/2023   10:48 AM  Fall Risk   Falls in the past year? 0 0 0 0 0  Number falls in past yr: 0 0 0 0 0  Injury with Fall? 0 0 0 0 0  Risk for fall due to :  No Fall Risks   Impaired balance/gait;Impaired mobility  Follow up Falls evaluation completed;Falls prevention discussed Falls evaluation completed Falls evaluation completed Falls evaluation completed Falls prevention discussed    MEDICARE RISK AT HOME:  Medicare Risk at Home Any stairs in or around the home?: No If so, are there any without handrails?: No Home free of loose throw rugs in walkways, pet beds, electrical cords, etc?: Yes Adequate lighting in your home to reduce risk of falls?: Yes Life alert?: No Use of a cane, walker or w/c?: Yes (cane) Grab bars in the bathroom?: Yes Shower chair or bench in  shower?: Yes Elevated toilet seat or a handicapped toilet?: Yes  TIMED UP AND GO:  Was the test performed?  No  Cognitive Function: 6CIT completed    10/12/2018    9:34 AM 08/10/2015   10:33 AM  MMSE - Mini Mental State Exam  Not completed:  --  Orientation to time 5   Orientation to Place 5   Registration 3   Attention/ Calculation 3   Recall 3   Language- name 2 objects 2   Language- repeat 1   Language- follow 3 step command 3   Language- read & follow direction 1   Write a sentence 1   Copy design 1   Total score 28         05/04/2024   10:54 AM 06/01/2023   10:52 AM 03/11/2022   11:29 AM 10/14/2019    9:42 AM  6CIT Screen  What Year? 0 points 0 points 0 points 0 points  What month? 0 points 0 points 0 points 0 points  What time? 0 points 0 points 0 points 0 points  Count back from 20 0 points 2 points 0 points 0 points  Months in reverse 0 points 4 points 0 points 0 points  Repeat phrase 0 points 6 points 0 points 2 points  Total Score 0 points 12 points 0 points 2 points    Immunizations Immunization History  Administered Date(s) Administered   Fluad Quad(high Dose 65+) 09/15/2019, 10/12/2020, 08/09/2021, 10/14/2022   Fluad Trivalent(High Dose 65+) 10/14/2023   Influenza Split 09/21/2012   Influenza Whole 11/08/2007, 09/12/2008, 09/11/2009, 09/24/2010   Influenza, High Dose Seasonal PF 10/11/2013, 08/15/2016, 10/06/2017, 09/01/2018   Influenza,inj,Quad PF,6+ Mos 08/08/2014, 08/14/2015   Moderna Covid-19 Fall Seasonal Vaccine 20yrs & older 10/04/2023   PFIZER(Purple Top)SARS-COV-2 Vaccination 01/27/2020, 02/22/2020, 09/29/2020   Pfizer Covid-19 Vaccine Bivalent Booster 73yrs & up 12/24/2021   Pneumococcal Conjugate-13 09/22/2013   Pneumococcal Polysaccharide-23 03/01/2008, 12/18/2019   Td 05/17/2010   Zoster Recombinant(Shingrix) 06/03/2023, 08/27/2023    Screening Tests Health Maintenance  Topic Date Due   DTaP/Tdap/Td (2 - Tdap) 05/17/2020   COVID-19  Vaccine (6 - Pfizer risk 2024-25 season) 04/02/2024   MAMMOGRAM  06/16/2024   INFLUENZA VACCINE  07/01/2024   Medicare Annual Wellness (AWV)  05/04/2025   Pneumonia Vaccine 71+ Years old  Completed   DEXA SCAN  Completed   Zoster Vaccines- Shingrix  Completed   HPV VACCINES  Aged Out   Meningococcal B Vaccine  Aged Out   Colonoscopy  Discontinued    Health Maintenance  Health Maintenance Due  Topic Date Due   DTaP/Tdap/Td (2 - Tdap) 05/17/2020   COVID-19 Vaccine (6 - Pfizer risk 2024-25 season) 04/02/2024   Health Maintenance Items Addressed: DEXA ordered, See Nurse Notes at the end of this note  Additional Screening:  Vision Screening: Recommended annual ophthalmology exams for early detection of glaucoma and other disorders of the eye. Would you like a referral to an eye doctor? No    Dental Screening: Recommended annual dental exams for proper oral hygiene  Community Resource Referral / Chronic Care Management: CRR required this visit?  No   CCM required this visit?  No   Plan:    I have personally reviewed and noted the following in the patient's chart:   Medical and social history Use of alcohol, tobacco or illicit drugs  Current medications and supplements including opioid prescriptions. Patient is not currently taking opioid prescriptions. Functional ability and status Nutritional status Physical activity Advanced directives List of other physicians Hospitalizations, surgeries, and ER visits in previous 12 months Vitals Screenings to include cognitive, depression, and falls Referrals and appointments  In addition, I have reviewed and discussed with patient certain preventive protocols, quality metrics, and best practice recommendations. A written personalized care plan for preventive services as well as general preventive health recommendations were provided to patient.   Myrle Dues L Melton Walls, CMA   05/04/2024   After Visit Summary: (MyChart) Due to this  being a telephonic visit, the after visit summary with patients personalized plan was offered to patient via MyChart   Notes: Please refer to Routing Comments.

## 2024-05-04 NOTE — Patient Instructions (Addendum)
 [name] , Thank you for taking time out of your busy schedule to complete your Annual Wellness Visit with me. I enjoyed our conversation and look forward to speaking with you again next year. I, as well as your care team,  appreciate your ongoing commitment to your health goals. Please review the following plan we discussed and let me know if I can assist you in the future. Your Game plan/ To Do List    Referrals: If you haven't heard from the office you've been referred to, please reach out to them at the phone provided.  You have an order for:  [x]   Bone Density    Please call for appointment: [facility] [address] [place] [address] [place], [place] [address] [phone]   Make sure to wear two-piece clothing.  No lotions, powders, or deodorants the day of the appointment. Make sure to bring picture ID and insurance card.  Bring list of medications you are currently taking including any supplements.   Follow up Visits: Next Medicare AWV with our clinical staff: 05/05/2025.   Have you seen your provider in the last 6 months (3 months if uncontrolled diabetes)? Yes Next Office Visit with your provider: 09/27/2025.  Clinician Recommendations:  Aim for 30 minutes of exercise or brisk walking, 6-8 glasses of water, and 5 servings of fruits and vegetables each day. You are due for a tetanus vaccine and can get that done at a local pharmacy.  Keep up the good work.      This is a list of the screening recommended for you and due dates:  Health Maintenance  Topic Date Due   DTaP/Tdap/Td vaccine (2 - Tdap) 05/17/2020   COVID-19 Vaccine (6 - Pfizer risk 2024-25 season) 04/02/2024   Mammogram  06/16/2024   Flu Shot  07/01/2024   Medicare Annual Wellness Visit  05/04/2025   Pneumonia Vaccine  Completed   DEXA scan (bone density measurement)  Completed   Zoster (Shingles) Vaccine  Completed   HPV Vaccine  Aged Out   Meningitis B Vaccine  Aged Out   Colon Cancer Screening   Discontinued    Advanced directives: (Declined) Advance directive discussed with you today. Even though you declined this today, please call our office should you change your mind, and we can give you the proper paperwork for you to fill out. Advance Care Planning is important because it:  [x]  Makes sure you receive the medical care that is consistent with your values, goals, and preferences  [x]  It provides guidance to your family and loved ones and reduces their decisional burden about whether or not they are making the right decisions based on your wishes.  Follow the link provided in your after visit summary or read over the paperwork we have mailed to you to help you started getting your Advance Directives in place. If you need assistance in completing these, please reach out to us  so that we can help you!  See attachments for Preventive Care and Fall Prevention Tips.

## 2024-05-08 ENCOUNTER — Other Ambulatory Visit: Payer: Self-pay | Admitting: Internal Medicine

## 2024-05-12 ENCOUNTER — Other Ambulatory Visit: Payer: Self-pay | Admitting: Internal Medicine

## 2024-05-18 ENCOUNTER — Other Ambulatory Visit: Payer: Self-pay | Admitting: Internal Medicine

## 2024-05-18 MED ORDER — AMLODIPINE BESYLATE 5 MG PO TABS: 5.0000 mg | ORAL_TABLET | Freq: Every day | ORAL | 1 refills | Status: DC

## 2024-05-18 MED ORDER — FLUTICASONE FUROATE-VILANTEROL 100-25 MCG/ACT IN AEPB: 1.0000 | INHALATION_SPRAY | Freq: Every day | RESPIRATORY_TRACT | 4 refills | Status: DC

## 2024-05-18 MED ORDER — FAMOTIDINE 20 MG PO TABS: 20.0000 mg | ORAL_TABLET | Freq: Two times a day (BID) | ORAL | 1 refills | Status: DC

## 2024-05-18 NOTE — Telephone Encounter (Signed)
 Copied from CRM [id number]. Topic: Clinical - Medication Refill >> May 18, 2024 10:20 AM Earnestine Goes B wrote: Medication: BREO ELLIPTA  100-25 MCG/ACT AEPB, famotidine  (PEPCID ) 20 MG tablamLODipine (NORVASC ) 5 MG tablet e,  Has the patient contacted their pharmacy? Yes (Agent: If no, request that the patient contact the pharmacy for the refill. If patient does not wish to contact the pharmacy document the reason why and proceed with request.) (Agent: If yes, when and what did the pharmacy advise?)  This is the patient's preferred pharmacy:  [phone] Jonette Nestle, [place] - [address]  [place] [address] Phone: [phone] Fax: [phone]  Is this the correct pharmacy for this prescription? Yes If no, delete pharmacy and type the correct one.   Has the prescription been filled recently? Yes  Is the patient out of the medication? Yes  Has the patient been seen for an appointment in the last year OR does the patient have an upcoming appointment? Yes  Can we respond through MyChart? Yes  Agent: Please be advised that Rx refills may take up to 3 business days. We ask that you follow-up with your pharmacy.

## 2024-05-19 ENCOUNTER — Inpatient Hospital Stay: Payer: Medicare PPO | Attending: Nurse Practitioner

## 2024-05-19 ENCOUNTER — Encounter: Payer: Self-pay | Admitting: Nurse Practitioner

## 2024-05-19 ENCOUNTER — Inpatient Hospital Stay: Payer: Medicare PPO | Admitting: Nurse Practitioner

## 2024-05-19 ENCOUNTER — Other Ambulatory Visit: Payer: Self-pay

## 2024-05-19 VITALS — BP 124/58 | HR 85 | Temp 98.0°F | Resp 17 | Ht 62.0 in | Wt 179.9 lb

## 2024-05-19 DIAGNOSIS — M1711 Unilateral primary osteoarthritis, right knee: Secondary | ICD-10-CM | POA: Insufficient documentation

## 2024-05-19 DIAGNOSIS — C180 Malignant neoplasm of cecum: Secondary | ICD-10-CM | POA: Diagnosis not present

## 2024-05-19 DIAGNOSIS — Z85038 Personal history of other malignant neoplasm of large intestine: Secondary | ICD-10-CM | POA: Diagnosis not present

## 2024-05-19 DIAGNOSIS — Z9049 Acquired absence of other specified parts of digestive tract: Secondary | ICD-10-CM | POA: Diagnosis not present

## 2024-05-19 LAB — CBC WITH DIFFERENTIAL (CANCER CENTER ONLY)
Abs Immature Granulocytes: 0.02 10*3/uL (ref 0.00–0.07)
Basophils Absolute: 0 10*3/uL (ref 0.0–0.1)
Basophils Relative: 0 %
Eosinophils Absolute: 0.3 10*3/uL (ref 0.0–0.5)
Eosinophils Relative: 5 %
HCT: 35 % — ABNORMAL LOW (ref 36.0–46.0)
Hemoglobin: 11.6 g/dL — ABNORMAL LOW (ref 12.0–15.0)
Immature Granulocytes: 0 %
Lymphocytes Relative: 24 %
Lymphs Abs: 1.4 10*3/uL (ref 0.7–4.0)
MCH: 31.9 pg (ref 26.0–34.0)
MCHC: 33.1 g/dL (ref 30.0–36.0)
MCV: 96.2 fL (ref 80.0–100.0)
Monocytes Absolute: 0.5 10*3/uL (ref 0.1–1.0)
Monocytes Relative: 9 %
Neutro Abs: 3.5 10*3/uL (ref 1.7–7.7)
Neutrophils Relative %: 62 %
Platelet Count: 193 10*3/uL (ref 150–400)
RBC: 3.64 MIL/uL — ABNORMAL LOW (ref 3.87–5.11)
RDW: 13.1 % (ref 11.5–15.5)
WBC Count: 5.7 10*3/uL (ref 4.0–10.5)
nRBC: 0 % (ref 0.0–0.2)

## 2024-05-19 LAB — CMP (CANCER CENTER ONLY)
ALT: 11 U/L (ref 0–44)
AST: 15 U/L (ref 15–41)
Albumin: 4.1 g/dL (ref 3.5–5.0)
Alkaline Phosphatase: 168 U/L — ABNORMAL HIGH (ref 38–126)
Anion gap: 5 (ref 5–15)
BUN: 13 mg/dL (ref 8–23)
CO2: 26 mmol/L (ref 22–32)
Calcium: 9.1 mg/dL (ref 8.9–10.3)
Chloride: 111 mmol/L (ref 98–111)
Creatinine: 0.98 mg/dL (ref 0.44–1.00)
GFR, Estimated: 55 mL/min — ABNORMAL LOW (ref >60)
Glucose, Bld: 118 mg/dL — ABNORMAL HIGH (ref 70–99)
Potassium: 4.1 mmol/L (ref 3.5–5.1)
Sodium: 142 mmol/L (ref 135–145)
Total Bilirubin: 0.5 mg/dL (ref 0.0–1.2)
Total Protein: 7.3 g/dL (ref 6.5–8.1)

## 2024-05-19 LAB — CEA (ACCESS): CEA (CHCC): 3 ng/mL (ref 0.00–5.00)

## 2024-05-19 NOTE — Progress Notes (Signed)
 [facility]   [phone] [phone]    Patient Care Team: Adelia Hom[place], MD as PCP - General (Internal Medicine) Jann Melody, MD as PCP - Cardiology (Cardiology) Melvenia Stabs, MD as Consulting Physician (General Surgery) Sonja [place], MD as Consulting Physician (Hematology) Kenney Peacemaker, MD as Consulting Physician (Gastroenterology) Aminta Kales, MD as Consulting Physician (Ophthalmology)   CHIEF COMPLAINT: Follow up right colon cancer  Oncology History Overview Note  Cancer Staging Adenocarcinoma of cecum [facility]) Staging form: Colon and Rectum, AJCC 8th Edition - Clinical: Stage Unknown (cTX, cN0, cM0) - Signed by Sonja [place], MD on 10/17/2019 - Pathologic stage from 12/15/2019: Stage IIA (pT3, pN0, cM0) - Signed by Sonja Dane, MD on 01/14/2020    Hx of colon cancer, stage II  09/26/2019 Imaging   CT AP W Contrast  IMPRESSION: Large cecal mass is noted concerning for malignancy. Colonoscopy is recommended for further evaluation. These results will be called to the ordering clinician or representative by the Radiologist Assistant, and communication documented in the PACS or zVision Dashboard.   Aortic Atherosclerosis (ICD10-I70.0).   09/28/2019 Initial Diagnosis   Adenocarcinoma of cecum (HCC)   10/04/2019 Pathology Results   Cecal mass biopsy showed adenocarcinoma   10/04/2019 Procedure   -Malignant tumor in the cecum. Biopsied. - Diverticulosis in the sigmoid colon. - The examination was otherwise normal   10/04/2019 Miscellaneous   MMR showed loss of MLH1 and PMS2 expression    10/10/2019 Imaging   CT chest W contrast  IMPRESSION: 1. Small volume nonocclusive pulmonary embolus identified in segmental left upper lobe pulmonary artery and subsegmental pulmonary artery to the left lower lobe. Left lower lobe embolus has a string like configuration suggesting that these findings may well be subacute  to chronic. Probable trace nonobstructive embolus in a subsegmental artery to the right lower lobe. 2. No evidence for metastatic disease in the thorax. 3.  Aortic Atherosclerois (ICD10-170.0)   10/17/2019 Cancer Staging   Staging form: Colon and Rectum, AJCC 8th Edition - Clinical: Stage Unknown (cTX, cN0, cM0) - Signed by Sonja [place], MD on 10/17/2019   12/15/2019 Surgery   LAPAROSCOPIC RIGHT HEMI COLECTOMY WITH LYSIS OF ADHESIONS by Dr. Camilo Cella     12/15/2019 Pathology Results   FINAL MICROSCOPIC DIAGNOSIS:   A. COLON, RIGHT, RESECTION:  -  Adenocarcinoma, moderately differentiated, 7 cm  -  Carcinoma invades through muscularis propria into pericolorectal  tissue  -  No carcinoma identified in nineteen (0/19)  -  Margins uninvolved by carcinoma  -  Appendix uninvolved by carcinoma  -  Tubular adenoma, incidental (0.3 cm)  -  See oncology table and comment below    12/15/2019 Cancer Staging   Staging form: Colon and Rectum, AJCC 8th Edition - Pathologic stage from 12/15/2019: Stage IIA (pT3, pN0, cM0) - Signed by Sonja , MD on 01/14/2020   11/15/2020 Imaging   CT CAP  IMPRESSION: No evidence of recurrent or metastatic carcinoma within the chest, abdomen, or pelvis.   Colonic diverticulosis. No radiographic evidence of diverticulitis.   Aortic and coronary atherosclerotic calcification.   03/18/2021 Procedure   Colonoscopy, surveillance  Impression: - Diverticulosis in the sigmoid colon. - Three diminutive polyps in the rectum and in the sigmoid colon, removed with a cold biopsy forceps. Resected and retrieved. - Patent end-to-side ileo-colonic anastomosis, characterized by healthy appearing mucosa.   03/18/2021 Pathology Results   Diagnosis 1. Surgical [P], colon, sigmoid, polyp (1) -  POLYPOID, INFLAMED GRANULATION TISSUE. - NO ADENOMATOUS CHANGE OR CARCINOMA. 2. Surgical [P], colon, rectum, polyp (2) - HYPERPLASTIC POLYP (TWO) - NO ADENOMATOUS CHANGE OR CARCINOMA.    11/13/2021 Imaging   EXAM: CT CHEST, ABDOMEN, AND PELVIS WITH CONTRAST  IMPRESSION: 1. No evidence of recurrence or metastatic disease within the chest, abdomen, or pelvis. 2. Colonic diverticulosis without findings of acute diverticulitis. 3. Three-vessel coronary artery disease. Please note that although the presence of coronary artery calcium  documents the presence of coronary artery disease, the severity of this disease and any potential stenosis cannot be assessed on this non-gated CT examination. Assessment for potential risk factor modification, dietary therapy or pharmacologic therapy may be warranted, if clinically indicated 4.  Aortic Atherosclerosis (ICD10-I70.0).      CURRENT THERAPY: Surveillance   INTERVAL HISTORY Jamie [name] returns for annual surviellance. Last seen by me 05/20/23.  She had COVID in January and has recovered, otherwise no change in her health.  Baseline intermittent cough is unchanged.  She is doing well from a colon cancer standpoint.  Good energy and appetite, no weight loss.  Denies change in bowel habits, black or bloody stools, N/V or abdominal pain/bloating.  ROS  All other systems reviewed and negative  Past Medical History:  Diagnosis Date   Adenocarcinoma of cecum (HCC) 09/28/2019   ANEMIA, IRON DEFICIENCY 09/10/2007   Anxiety    Arthritis    Asthma    Blood transfusion without reported diagnosis    Cataract 2008   bilateral cataract extraction   CATARACT EXTRACTION, HX OF 09/10/2007   DYSPEPSIA, HX OF 09/10/2007   GERD (gastroesophageal reflux disease)    HAIR LOSS 03/21/2009   HAY FEVER 09/10/2007   HEARING LOSS, SENSORINEURAL 09/10/2007   HYPERLIPIDEMIA 09/10/2007   HYPERTENSION 09/10/2007   LOW BACK PAIN 05/10/2008   Pain in joint, hand 03/01/2008   PEPTIC ULCER DISEASE 09/10/2007   POLYP, COLON 09/10/2007   POLYPECTOMY, HX OF 09/10/2007   Pulmonary embolism (HCC) 1- 2 non-occlusive 10/10/2019   SHINGLES 08/13/2009   SHOULDER  PAIN, LEFT 02/12/2008   TAH/BSO, HX OF 09/10/2007   Tuberculosis    PATIENT DENIES AT PHONE INTERVIEW ON 12/12/2019     Past Surgical History:  Procedure Laterality Date   ABDOMINAL HYSTERECTOMY     BREAST SURGERY     breast biopsy-benign   CATARACT EXTRACTION     COLONOSCOPY     EYE SURGERY     bilateral cataract with lens implant   IRRIGATION AND DEBRIDEMENT SEBACEOUS CYST     LAPAROSCOPIC RIGHT HEMI COLECTOMY Right 12/15/2019   Procedure: LAPAROSCOPIC RIGHT HEMI COLECTOMY WITH LYSIS OF ADHESIONS;  Surgeon: Melvenia Stabs, MD;  Location: [facility];  Service: General;  Laterality: Right;   TOTAL ABDOMINAL HYSTERECTOMY W/ BILATERAL SALPINGOOPHORECTOMY       Outpatient Encounter Medications as of 05/19/2024  Medication Sig   acetaminophen  (TYLENOL ) 500 MG tablet Take 500-1,000 mg by mouth every 6 (six) hours as needed (for pain.).   albuterol  (VENTOLIN  HFA) 108 (90 Base) MCG/ACT inhaler INHALE 2 PUFFS BY MOUTH EVERY 4 HOURS AS NEEDED FOR WHEEZE OR FOR SHORTNESS OF BREATH   ALPRAZolam  (XANAX ) 0.5 MG tablet TAKE 1 TABLET BY MOUTH EVERY DAY AS NEEDED FOR ANXIETY   alum & mag hydroxide-simeth (MAALOX/MYLANTA) 200-200-20 MG/5ML suspension Take 15 mLs by mouth every 6 (six) hours as needed for indigestion or heartburn.   amLODipine  (NORVASC ) 5 MG tablet Take 1 tablet (5 mg total) by mouth daily.  atorvastatin  (LIPITOR) 40 MG tablet TAKE 1 TABLET BY MOUTH EVERY DAY   benzonatate  (TESSALON ) 200 MG capsule TAKE 1 CAPSULE (200 MG TOTAL) BY MOUTH 3 (THREE) TIMES DAILY AS NEEDED FOR COUGH.   betamethasone  dipropionate 0.05 % cream APPLY TO AFFECTED AREA TWICE A DAY   diclofenac  Sodium (VOLTAREN ) 1 % GEL Apply 2 g topically 4 (four) times daily.   ezetimibe  (ZETIA ) 10 MG tablet TAKE 1 TABLET BY MOUTH EVERY DAY   famotidine  (PEPCID ) 20 MG tablet Take 1 tablet (20 mg total) by mouth 2 (two) times daily.   fluticasone  furoate-vilanterol (BREO ELLIPTA ) 100-25 MCG/ACT AEPB Inhale 1 puff into the  lungs daily.   furosemide  (LASIX ) 40 MG tablet TAKE 1 TABLET BY MOUTH EVERY DAY   montelukast  (SINGULAIR ) 10 MG tablet TAKE 1 TABLET BY MOUTH EVERYDAY AT BEDTIME   mupirocin  ointment (BACTROBAN ) 2 % APPLY TO AFFECTED AREA TWICE A DAY   nystatin  cream (MYCOSTATIN ) Apply 1 Application topically 2 (two) times daily. (Patient taking differently: Apply 1 Application topically as needed for dry skin.)   olmesartan  (BENICAR ) 40 MG tablet TAKE 1 TABLET BY MOUTH EVERY DAY   ondansetron  (ZOFRAN -ODT) 4 MG disintegrating tablet TAKE 1 TABLET BY MOUTH EVERY 8 HOURS AS NEEDED FOR NAUSEA AND VOMITING   potassium chloride  (KLOR-CON ) 10 MEQ tablet TAKE 1 TABLET BY MOUTH EVERY DAY   sennosides-docusate sodium (SENOKOT-S) 8.6-50 MG tablet Take 1 tablet by mouth daily as needed for constipation.   triamcinolone  cream (KENALOG ) 0.1 % APPLY TO AFFECTED AREA TOPICALLY TWICE A DAY AS NEEDED FOR SKIN IRRITATION/RASH   No facility-administered encounter medications on file as of 05/19/2024.     Today's Vitals   05/19/24 1036 05/19/24 1051  BP: (!) 124/58   Pulse: 85   Resp: 17   Temp: 98 F (36.7 C)   TempSrc: Temporal   SpO2: 99%   Weight: 179 lb 14.4 oz (81.6 kg)   Height: 5' 2 (1.575 m)   PainSc:  0-No pain   Body mass index is 32.9 kg/m.   ECOG PERFORMANCE STATUS: 0 - Asymptomatic  PHYSICAL EXAM GENERAL:alert, no distress and comfortable SKIN: no rash  EYES: sclera clear NECK: without mass LYMPH:  no palpable cervical or supraclavicular lymphadenopathy  LUNGS: clear with normal breathing effort HEART: regular rate & rhythm, no lower extremity edema ABDOMEN: abdomen soft, non-tender and normal bowel sounds NEURO: alert & oriented x 3 with fluent speech.  Exam performed to wheelchair   CBC    Latest Ref Rng & Units 05/19/2024   10:26 AM 03/28/2024    9:50 AM 12/11/2023   11:25 AM  CBC  WBC 4.0 - 10.5 K/uL 5.7  5.2  7.1   Hemoglobin 12.0 - 15.0 g/dL 04.5  40.9  81.1   Hematocrit 36.0 - 46.0  % 35.0  35.9  35.2   Platelets 150 - 400 K/uL 193  193.0  186       CMP     Latest Ref Rng & Units 05/19/2024   10:26 AM 03/28/2024    9:50 AM 12/11/2023   11:25 AM  CMP  Glucose 70 - 99 mg/dL 914  99  782   BUN 8 - 23 mg/dL 13  16  9    Creatinine 0.44 - 1.00 mg/dL 9.56  2.13  0.86   Sodium 135 - 145 mmol/L 142  142  139   Potassium 3.5 - 5.1 mmol/L 4.1  4.0  3.9   Chloride 98 - 111  mmol/L 111  109  109   CO2 22 - 32 mmol/L 26  27  23    Calcium  8.9 - 10.3 mg/dL 9.1  9.3  8.8   Total Protein 6.5 - 8.1 g/dL 7.3  7.2    Total Bilirubin 0.0 - 1.2 mg/dL 0.5  0.6    Alkaline Phos 38 - 126 U/L 168  147    AST 15 - 41 U/L 15  14    ALT 0 - 44 U/L 11  12        ASSESSMENT & PLAN:Jamie [name] is a 89 y.o. female with    1. Right colon cancer, pT3N0M0, stage IIA, MSI-H -Diagnosed 10/2019, s/p curative right hemicolectomy with Dr. Camilo Cella on 12/15/19 -MMR show MLH1 and PMS2 loss, and MLH hypermethylation positive, supports sporadic cancer, not Lynch syndrome.   -Adjuvant chemotherapy was not recommended.  -Surveillance colonoscopy on 03/18/21 was negative. Recall not indicated due to age. -surveillance CT CAP 11/13/21 showed NED. -Jamie [name] is clinically doing well.  Exam is benign, labs are stable.  Overall no clinical concern for recurrence. -Follow-up with PCP in the future, see us  back if needed   2. HTN, GERD, Arthritis, asthma/bronchitis -Continue to f/u with PCP -She has arthritis mainly in R knee and wrist -She ambulates with walker and cane, no fall.    PLAN: -Labs reviewed -Completed colon cancer surveillance in our clinic, f/up open -Continue PCP f/up and routine health maintenance     All questions were answered. The patient knows to call the clinic with any problems, questions or concerns. No barriers to learning were detected.   Lakin Rhine K Oreoluwa Gilmer, NP 05/19/2024

## 2024-06-01 ENCOUNTER — Ambulatory Visit: Admitting: Podiatry

## 2024-06-01 ENCOUNTER — Encounter: Payer: Self-pay | Admitting: Podiatry

## 2024-06-01 DIAGNOSIS — M79676 Pain in unspecified toe(s): Secondary | ICD-10-CM

## 2024-06-01 DIAGNOSIS — B351 Tinea unguium: Secondary | ICD-10-CM | POA: Diagnosis not present

## 2024-06-01 DIAGNOSIS — Q828 Other specified congenital malformations of skin: Secondary | ICD-10-CM

## 2024-06-01 DIAGNOSIS — D689 Coagulation defect, unspecified: Secondary | ICD-10-CM

## 2024-06-01 NOTE — Progress Notes (Signed)
 This patient presents to the office with chief complaint of long thick painful nails and painful calluses both feet. Patient says the nails  and callus are painful walking and wearing shoes.  This patient is unable to self treat.  This patient is unable to trim her nails and calluses since she is unable to reach her nails.  She presents to the office for preventative foot care services.  General Appearance  Alert, conversant and in no acute stress.  Vascular  Dorsalis pedis and posterior tibial  pulses are  weakly palpable  bilaterally.  Capillary return is within normal limits  bilaterally. Temperature is within normal limits  bilaterally.  Neurologic  Senn-Weinstein monofilament wire test within normal limits  bilaterally. Muscle power within normal limits bilaterally.  Nails Thick disfigured discolored nails with subungual debris  from hallux to fifth toes bilaterally. No evidence of bacterial infection or drainage bilaterally.  Orthopedic  No limitations of motion  feet .  No crepitus or effusions noted.  No bony pathology or digital deformities noted.  Skin  normotropic skin with no porokeratosis noted bilaterally.  No signs of infections or ulcers noted.   Porokeratosis sub 2 and heel left foot.  Porokeratosis under !ST MPJ right foot.   Onychomycosis  Nails  B/L.  Pain in right toes  Pain in left toes Porokeratosis  B/L.  Debridement of nails both feet followed trimming the nails with dremel tool. Debride callus with # 15 blade and dremel tool.    RTC 3 months.   Cordella Bold DPM

## 2024-06-14 ENCOUNTER — Ambulatory Visit (INDEPENDENT_AMBULATORY_CARE_PROVIDER_SITE_OTHER)
Admission: RE | Admit: 2024-06-14 | Discharge: 2024-06-14 | Disposition: A | Discharge status: Home / Self Care | Source: Ambulatory Visit | Attending: Internal Medicine | Admitting: Internal Medicine

## 2024-06-14 DIAGNOSIS — Z780 Asymptomatic menopausal state: Secondary | ICD-10-CM | POA: Diagnosis not present

## 2024-06-16 ENCOUNTER — Ambulatory Visit: Payer: Self-pay | Admitting: Internal Medicine

## 2024-06-16 LAB — HM DEXA SCAN: HM Dexa Scan: 0.5

## 2024-06-16 NOTE — Progress Notes (Signed)
This has been abstracted.  

## 2024-06-23 ENCOUNTER — Encounter: Payer: Self-pay | Admitting: Family Medicine

## 2024-06-23 ENCOUNTER — Ambulatory Visit (INDEPENDENT_AMBULATORY_CARE_PROVIDER_SITE_OTHER): Admitting: Family Medicine

## 2024-06-23 VITALS — BP 138/58 | HR 81 | Temp 98.4°F | Ht 62.0 in | Wt 179.0 lb

## 2024-06-23 DIAGNOSIS — H9192 Unspecified hearing loss, left ear: Secondary | ICD-10-CM | POA: Insufficient documentation

## 2024-06-23 DIAGNOSIS — H6123 Impacted cerumen, bilateral: Secondary | ICD-10-CM | POA: Diagnosis not present

## 2024-06-23 NOTE — Patient Instructions (Signed)
 We have cleaned out your ears in the office today.  You may use Debrox at home to try to help keep the wax from building up.  Follow-up with [place] as needed if you are having any persistent symptoms.

## 2024-06-23 NOTE — Progress Notes (Signed)
   Acute Office Visit  Subjective:     Patient ID: Jamie [name], female    DOB: 01-15-1933, 89 y.o.   MRN: [id number]  Chief Complaint  Patient presents with   Acute Visit    Ear fullness, ongoing for 2 weeks. Hearing is muffled, sounds distant    HPI  Discussed the use of AI scribe software for clinical note transcription with the patient, who gave verbal consent to proceed.  History of Present Illness Jamie [name] is an 89 year old female who presents with decreased hearing in the left ear.  Auditory disturbance - Decreased hearing in the left ear for the past two weeks - Describes hearing as muffled with sounds feeling distant - No associated otalgia, swelling, or otorrhea - No use of hearing aids  Cerumen impaction history - History of cerumen impaction in the left ear - Previously resolved with ear lavage     ROS Per HPI      Objective:    BP (!) 138/58 (BP Location: Left Arm, Patient Position: Sitting)   Pulse 81   Temp 98.4 F (36.9 C) (Temporal)   Ht 5' 2 (1.575 m)   Wt 179 lb (81.2 kg)   SpO2 98%   BMI 32.74 kg/m    Physical Exam Vitals and nursing note reviewed.  Constitutional:      General: She is not in acute distress.    Appearance: Normal appearance.     Comments: Elderly  HENT:     Head: Normocephalic and atraumatic.     Right Ear: External ear normal.     Left Ear: External ear normal.     Nose: Nose normal.     Mouth/Throat:     Mouth: Mucous membranes are moist.     Pharynx: Oropharynx is clear.  Eyes:     Extraocular Movements: Extraocular movements intact.     Pupils: Pupils are equal, round, and reactive to light.  Cardiovascular:     Rate and Rhythm: Normal rate.     Pulses: Normal pulses.     Heart sounds: Normal heart sounds.  Pulmonary:     Effort: Pulmonary effort is normal.  Musculoskeletal:     Cervical back: Normal range of motion.     Right lower leg: No edema.     Left lower leg: No edema.      Comments: In wheelchair for visit  Lymphadenopathy:     Cervical: No cervical adenopathy.  Neurological:     General: No focal deficit present.     Mental Status: She is alert and oriented to person, place, and time.  Psychiatric:        Mood and Affect: Mood normal.        Thought Content: Thought content normal.   Ear lavage completed by Ronnald Palms, CMA Patient tolerated well with good results Ear canals clear on post lavage exam  No results found for any visits on 06/23/24.      Assessment & Plan:   Assessment and Plan Assessment & Plan Cerumen Impaction Decreased hearing in the left ear due to cerumen impaction. Hearing improved post-lavage. - Performed ear lavage. - Advised follow-up as needed.     Orders Placed This Encounter  Procedures   Ear Lavage     No orders of the defined types were placed in this encounter.   Return if symptoms worsen or fail to improve.  Jamie LITTIE Ku, FNP

## 2024-07-18 ENCOUNTER — Other Ambulatory Visit: Payer: Self-pay | Admitting: Internal Medicine

## 2024-07-18 ENCOUNTER — Telehealth: Payer: Self-pay

## 2024-07-18 NOTE — Telephone Encounter (Signed)
 Copied from CRM [id number]. Topic: Clinical - Medication Prior Auth >> Jul 15, 2024  4:34 PM Jasmin G wrote: Reason for CRM: Pt's preferred pharmacy, [facility] [phone] - [place], [place] - [address], needs prior authorization from PCP, Dr. Rollene to refill her furosemide  (LASIX ) 40 MG tablet.

## 2024-07-18 NOTE — Telephone Encounter (Signed)
 This is a refill request. Did you address? Not controlled

## 2024-07-18 NOTE — Telephone Encounter (Unsigned)
 Copied from CRM #[id number]. Topic: Clinical - Medication Refill >> Jul 18, 2024  9:54 AM Chiquita SQUIBB wrote: Medication:  furosemide  furosemide  (LASIX ) 40 MG tablet   Has the patient contacted their pharmacy? Yes (Agent: If no, request that the patient contact the pharmacy for the refill. If patient does not wish to contact the pharmacy document the reason why and proceed with request.) (Agent: If yes, when and what did the pharmacy advise?)  This is the patient's preferred pharmacy:  [phone] [place],  - [address] [place] [address] Phone: [phone] Fax: [phone]  Is this the correct pharmacy for this prescription? Yes If no, delete pharmacy and type the correct one.   Has the prescription been filled recently? No  Is the patient out of the medication? Yes  Has the patient been seen for an appointment in the last year OR does the patient have an upcoming appointment? Yes  Can we respond through MyChart? Yes  Agent: Please be advised that Rx refills may take up to 3 business days. We ask that you follow-up with your pharmacy.

## 2024-07-18 NOTE — Telephone Encounter (Signed)
Please advise approval

## 2024-07-19 ENCOUNTER — Telehealth: Payer: Self-pay

## 2024-07-19 ENCOUNTER — Other Ambulatory Visit (HOSPITAL_COMMUNITY): Payer: Self-pay

## 2024-07-19 MED ORDER — FUROSEMIDE 40 MG PO TABS: 40.0000 mg | ORAL_TABLET | Freq: Every day | ORAL | 1 refills | Status: DC

## 2024-07-19 NOTE — Telephone Encounter (Signed)
 Copied from CRM [id number]. Topic: Clinical - Medication Prior Auth >> Jul 19, 2024  9:04 AM Jasmin G wrote: Reason for CRM: Pt's preferred pharmacy, [facility] [phone] - [place], [place] - [address] needs prior authorization from PCP, Dr. Rollene to refill her furosemide  (LASIX ) 40 MG tablet.

## 2024-07-20 NOTE — Telephone Encounter (Signed)
 This was sent in on the 19th

## 2024-07-20 NOTE — Telephone Encounter (Signed)
 Ok I did send in refill on 8/19

## 2024-09-01 ENCOUNTER — Ambulatory Visit: Admitting: Podiatry

## 2024-09-26 ENCOUNTER — Encounter: Payer: Self-pay | Admitting: Family Medicine

## 2024-09-26 ENCOUNTER — Ambulatory Visit: Payer: Self-pay

## 2024-09-26 ENCOUNTER — Ambulatory Visit: Admitting: Family Medicine

## 2024-09-26 VITALS — BP 136/60 | HR 66 | Temp 98.7°F | Resp 18 | Ht 62.0 in | Wt 178.0 lb

## 2024-09-26 DIAGNOSIS — Z23 Encounter for immunization: Secondary | ICD-10-CM

## 2024-09-26 DIAGNOSIS — N3001 Acute cystitis with hematuria: Secondary | ICD-10-CM

## 2024-09-26 LAB — POCT URINALYSIS DIPSTICK
Bilirubin, UA: NEGATIVE
Blood, UA: POSITIVE
Glucose, UA: NEGATIVE
Ketones, UA: POSITIVE
Nitrite, UA: NEGATIVE
Protein, UA: NEGATIVE
Spec Grav, UA: <=1.005 — AB (ref 1.010–1.025)
Urobilinogen, UA: NEGATIVE U/dL — AB
pH, UA: 6.5 (ref 5.0–8.0)

## 2024-09-26 MED ORDER — CEFDINIR 300 MG PO CAPS: 300.0000 mg | ORAL_CAPSULE | Freq: Two times a day (BID) | ORAL | 0 refills | Status: AC

## 2024-09-26 NOTE — Telephone Encounter (Signed)
  FYI Only or Action Required?: FYI only for provider.  Patient was last seen in primary care on 06/23/2024 by Alvia Corean CROME, FNP.  Called Nurse Triage reporting Abdominal Pain.  Symptoms began Saturday.  Interventions attempted: OTC medications: tylenol .  Symptoms are: unchanged.  Triage Disposition: See HCP Within 4 Hours (Or PCP Triage)  Patient/caregiver understands and will follow disposition?: Yes    Copied from CRM [id number]. Topic: Clinical - Red Word Triage >> Sep 26, 2024  7:40 AM Victoria A wrote: [facility] that prompted transfer to Nurse Triage: Patient is having pain in her stomach started Saturday morning. Reason for Disposition  [1] MILD-MODERATE pain AND [2] constant AND [3] present > 2 hours  Answer Assessment - Initial Assessment Questions 1. LOCATION: Where does it hurt?      Lower abd  2. RADIATION: Does the pain shoot anywhere else? (e.g., chest, back)     no 3. ONSET: When did the pain begin? (e.g., minutes, hours or days ago)      Saturday morning 4. SUDDEN: Gradual or sudden onset?     gradual 5. PATTERN Does the pain come and go, or is it constant?     Comes and goes 6. SEVERITY: How bad is the pain?  (e.g., Scale 1-10; mild, moderate, or severe)     8/10 7. RECURRENT SYMPTOM: Have you ever had this type of stomach pain before? If Yes, ask: When was the last time? and What happened that time?      Yes but can't remember what it was   8. CAUSE: What do you think is causing the stomach pain? (e.g., gallstones, recent abdominal surgery)     unsure 9. RELIEVING/AGGRAVATING FACTORS: What makes it better or worse? (e.g., antacids, bending or twisting motion, bowel movement)     no 10. OTHER SYMPTOMS: Do you have any other symptoms? (e.g., back pain, diarrhea, fever, urination pain, vomiting)       Low grade fever yesterday 11. PREGNANCY: Is there any chance you are pregnant? When was your last menstrual period?        na  Feels like squeezing comes and goes  Protocols used: Abdominal Pain - [facility]

## 2024-09-26 NOTE — Progress Notes (Signed)
 Assessment & Plan Acute cystitis with hematuria Education provided on UTIs. Encouraged adequate hydration.  Orders:   POCT urinalysis dipstick   cefdinir (OMNICEF) 300 MG capsule; Take 1 capsule (300 mg total) by mouth 2 (two) times daily for 7 days.   Urine Culture; Future  Immunization due  Orders:   Flu vaccine HIGH DOSE PF(Fluzone Trivalent)    Follow up plan: Return if symptoms worsen or fail to improve.  Niki Rung, MSN, APRN, FNP-C  Subjective:  HPI: Jamie [name] is a 89 y.o. female presenting on 09/26/2024 for Abdominal Pain (SAT am started with pain lower abdomen - squeezing feeling /No vomit, diarrhea, or nausea. No constipation )  Discussed the use of AI scribe software for clinical note transcription with the patient, who gave verbal consent to proceed.  She has been experiencing intermittent lower abdominal pain since Saturday morning, described as a squeezing sensation at the very bottom of her stomach. The pain is not constant and comes and goes.  No nausea, vomiting, diarrhea, or constipation. Regular bowel movements, with the last one occurring yesterday evening, which did not alleviate the pain.  She has been taking Tylenol , which helps relieve the pain, and has been drinking cranberry juice. She recalls having similar symptoms a long time ago, which were attributed to a urinary infection at that time.  No burning sensation during urination but notes an increase in frequency of urination without a decrease in the amount.       ROS: Negative unless specifically indicated above in HPI.   Relevant past medical history reviewed and updated as indicated.   Allergies and medications reviewed and updated.   Current Outpatient Medications:    acetaminophen  (TYLENOL ) 500 MG tablet, Take 500-1,000 mg by mouth every 6 (six) hours as needed (for pain.)., Disp: , Rfl:    albuterol  (VENTOLIN  HFA) 108 (90 Base) MCG/ACT inhaler, INHALE 2 PUFFS BY MOUTH  EVERY 4 HOURS AS NEEDED FOR WHEEZE OR FOR SHORTNESS OF BREATH, Disp: 25.5 each, Rfl: 1   ALPRAZolam  (XANAX ) 0.5 MG tablet, TAKE 1 TABLET BY MOUTH EVERY DAY AS NEEDED FOR ANXIETY, Disp: 20 tablet, Rfl: 0   alum & mag hydroxide-simeth (MAALOX/MYLANTA) 200-200-20 MG/5ML suspension, Take 15 mLs by mouth every 6 (six) hours as needed for indigestion or heartburn., Disp: , Rfl:    amLODipine  (NORVASC ) 5 MG tablet, Take 1 tablet (5 mg total) by mouth daily., Disp: 90 tablet, Rfl: 1   atorvastatin  (LIPITOR) 40 MG tablet, TAKE 1 TABLET BY MOUTH EVERY DAY, Disp: 90 tablet, Rfl: 3   benzonatate  (TESSALON ) 200 MG capsule, TAKE 1 CAPSULE (200 MG TOTAL) BY MOUTH 3 (THREE) TIMES DAILY AS NEEDED FOR COUGH., Disp: 60 capsule, Rfl: 0   betamethasone  dipropionate 0.05 % cream, APPLY TO AFFECTED AREA TWICE A DAY, Disp: 45 g, Rfl: 0   BREO ELLIPTA  100-25 MCG/ACT AEPB, INHALE 1 PUFF BY MOUTH EVERY DAY, Disp: 60 each, Rfl: 4   cefdinir (OMNICEF) 300 MG capsule, Take 1 capsule (300 mg total) by mouth 2 (two) times daily for 7 days., Disp: 14 capsule, Rfl: 0   diclofenac  Sodium (VOLTAREN ) 1 % GEL, Apply 2 g topically 4 (four) times daily., Disp: 100 g, Rfl: 5   ezetimibe  (ZETIA ) 10 MG tablet, TAKE 1 TABLET BY MOUTH EVERY DAY, Disp: 90 tablet, Rfl: 3   famotidine  (PEPCID ) 20 MG tablet, Take 1 tablet (20 mg total) by mouth 2 (two) times daily., Disp: 180 tablet, Rfl: 1   furosemide  (LASIX )  40 MG tablet, Take 1 tablet (40 mg total) by mouth daily., Disp: 90 tablet, Rfl: 1   montelukast  (SINGULAIR ) 10 MG tablet, TAKE 1 TABLET BY MOUTH EVERYDAY AT BEDTIME, Disp: 90 tablet, Rfl: 1   mupirocin  ointment (BACTROBAN ) 2 %, APPLY TO AFFECTED AREA TWICE A DAY, Disp: 22 g, Rfl: 0   nystatin  cream (MYCOSTATIN ), Apply 1 Application topically 2 (two) times daily., Disp: 100 g, Rfl: 0   olmesartan  (BENICAR ) 40 MG tablet, TAKE 1 TABLET BY MOUTH EVERY DAY, Disp: 90 tablet, Rfl: 3   ondansetron  (ZOFRAN -ODT) 4 MG disintegrating tablet, TAKE 1  TABLET BY MOUTH EVERY 8 HOURS AS NEEDED FOR NAUSEA AND VOMITING, Disp: 60 tablet, Rfl: 0   sennosides-docusate sodium (SENOKOT-S) 8.6-50 MG tablet, Take 1 tablet by mouth daily as needed for constipation., Disp: , Rfl:    triamcinolone  cream (KENALOG ) 0.1 %, APPLY TO AFFECTED AREA TOPICALLY TWICE A DAY AS NEEDED FOR SKIN IRRITATION/RASH, Disp: 90 g, Rfl: 0   fluticasone  furoate-vilanterol (BREO ELLIPTA ) 100-25 MCG/ACT AEPB, Inhale 1 puff into the lungs daily., Disp: 60 each, Rfl: 4   potassium chloride  (KLOR-CON ) 10 MEQ tablet, TAKE 1 TABLET BY MOUTH EVERY DAY, Disp: 90 tablet, Rfl: 1  Allergies  Allergen Reactions   Aspirin  Other (See Comments)    Stomach upset   Latex Itching and Rash    Objective:   BP 136/60   Pulse 66   Temp 98.7 F (37.1 C)   Resp 18   Ht 5' 2 (1.575 m)   Wt 178 lb (80.7 kg)   BMI 32.56 kg/m    Physical Exam Vitals reviewed.  Constitutional:      General: She is not in acute distress.    Appearance: Normal appearance. She is not ill-appearing, toxic-appearing or diaphoretic.  HENT:     Head: Normocephalic and atraumatic.  Eyes:     General: No scleral icterus.       Right eye: No discharge.        Left eye: No discharge.     Conjunctiva/sclera: Conjunctivae normal.  Cardiovascular:     Rate and Rhythm: Normal rate.  Pulmonary:     Effort: Pulmonary effort is normal. No respiratory distress.  Abdominal:     Tenderness: There is no abdominal tenderness.  Musculoskeletal:        General: Normal range of motion.     Cervical back: Normal range of motion.  Skin:    General: Skin is warm and dry.     Capillary Refill: Capillary refill takes less than 2 seconds.  Neurological:     General: No focal deficit present.     Mental Status: She is alert and oriented to person, place, and time. Mental status is at baseline.  Psychiatric:        Mood and Affect: Mood normal.        Behavior: Behavior normal.        Thought Content: Thought content  normal.        Judgment: Judgment normal.

## 2024-09-27 ENCOUNTER — Ambulatory Visit: Payer: Self-pay | Admitting: Internal Medicine

## 2024-09-27 ENCOUNTER — Ambulatory Visit: Admitting: Internal Medicine

## 2024-09-27 VITALS — BP 130/70 | HR 78 | Temp 98.7°F | Ht 62.0 in | Wt 177.8 lb

## 2024-09-27 DIAGNOSIS — N3000 Acute cystitis without hematuria: Secondary | ICD-10-CM

## 2024-09-27 DIAGNOSIS — R200 Anesthesia of skin: Secondary | ICD-10-CM

## 2024-09-27 DIAGNOSIS — G8929 Other chronic pain: Secondary | ICD-10-CM | POA: Diagnosis not present

## 2024-09-27 DIAGNOSIS — M545 Low back pain, unspecified: Secondary | ICD-10-CM | POA: Diagnosis not present

## 2024-09-27 LAB — VITAMIN B12: Vitamin B-12: 146 pg/mL — ABNORMAL LOW (ref 211–911)

## 2024-09-27 LAB — URINE CULTURE: Result:: NO GROWTH

## 2024-09-27 LAB — VITAMIN D 25 HYDROXY (VIT D DEFICIENCY, FRACTURES): VITD: 10.07 ng/mL — ABNORMAL LOW (ref 30.00–100.00)

## 2024-09-27 LAB — HEMOGLOBIN A1C: Hgb A1c MFr Bld: 5.9 % (ref 4.6–6.5)

## 2024-09-27 LAB — TSH: TSH: 1.81 u[IU]/mL (ref 0.35–5.50)

## 2024-09-27 MED ORDER — VITAMIN D (ERGOCALCIFEROL) 1.25 MG (50000 UNIT) PO CAPS: 50000.0000 [IU] | ORAL_CAPSULE | ORAL | 0 refills | Status: AC

## 2024-09-27 NOTE — Patient Instructions (Signed)
 We will check the vitamin levels and let you know if this is causing the tingling in the hands.

## 2024-09-27 NOTE — Progress Notes (Unsigned)
   Subjective:   Patient ID: Jamie [name], female    DOB: Jul 15, 1933, 89 y.o.   MRN: [id number]  Discussed the use of AI scribe software for clinical note transcription with the patient, who gave verbal consent to proceed.  History of Present Illness Jamie [name] is an 89 year old female who presents with numbness in her fingertips and follow-up for a urinary tract infection.  She experiences numbness at the ends of her fingers, which sometimes makes it difficult to pick up or grab objects. This sensation occurs more frequently when she is relaxing or going to sleep, and occasionally her hand shakes slightly. She recalls that at one point her vitamin B12 level was found to be a little low.  She is currently being treated for a urinary tract infection with antibiotics, having taken two doses so far. She has not experienced any new symptoms since starting the medication.  She experiences pain in her shoulders and wrists, with the pain sometimes radiating up into her neck. Her knee pain has improved, but she still feels discomfort in her shoulders and wrists.  She occasionally notices swelling around her ankles, but it is not frequent. No chest pain, breathing difficulties, stomach pain, diarrhea, constipation, blood in her stool, new headaches, or migraines.  Review of Systems  Constitutional: Negative.   HENT: Negative.    Eyes: Negative.   Respiratory:  Negative for cough, chest tightness and shortness of breath.   Cardiovascular:  Negative for chest pain, palpitations and leg swelling.  Gastrointestinal:  Negative for abdominal distention, abdominal pain, constipation, diarrhea, nausea and vomiting.  Genitourinary:  Positive for frequency.  Musculoskeletal: Negative.   Skin: Negative.   Neurological:  Positive for numbness.  Psychiatric/Behavioral: Negative.      Objective:  Physical Exam Constitutional:      Appearance: She is well-developed.  HENT:     Head:  Normocephalic and atraumatic.  Cardiovascular:     Rate and Rhythm: Normal rate and regular rhythm.  Pulmonary:     Effort: Pulmonary effort is normal. No respiratory distress.     Breath sounds: Normal breath sounds. No wheezing or rales.  Abdominal:     General: Bowel sounds are normal. There is no distension.     Palpations: Abdomen is soft.     Tenderness: There is no abdominal tenderness.  Musculoskeletal:     Cervical back: Normal range of motion.  Skin:    General: Skin is warm and dry.  Neurological:     Mental Status: She is alert and oriented to person, place, and time.     Coordination: Coordination normal.     Vitals:   09/27/24 0904  BP: 130/70  Pulse: 78  Temp: 98.7 F (37.1 C)  SpO2: 99%  Weight: 177 lb 12.8 oz (80.6 kg)  Height: 5' 2 (1.575 m)    Assessment and Plan Assessment & Plan Urinary tract infection She is currently on antibiotics. Advised to finish and drink plenty of fluids.   Osteoarthritis of the shoulder and wrist   Chronic pain persists with recent exacerbation.  Paresthesia of the fingers   Intermittent numbness and tingling may be due to low vitamin levels. Order vitamin B12, D, TSH, HgA1c test.

## 2024-09-28 ENCOUNTER — Telehealth: Payer: Self-pay

## 2024-09-28 ENCOUNTER — Ambulatory Visit: Payer: Self-pay | Admitting: Family Medicine

## 2024-09-28 NOTE — Telephone Encounter (Signed)
 Copied from CRM [id number]. Topic: Clinical - Lab/Test Results >> Sep 28, 2024 12:07 PM Rea ORN wrote: Reason for CRM: Pt returned call about lab results. She is asking when she should take the new vitamin and when should she take the B12 injection. Please call back [phone].

## 2024-09-28 NOTE — Telephone Encounter (Signed)
 Spoke with patient. Reviewed that she is to take Vit D once a week for 12 weeks and start Vitamin B12 injections weekly for one month. She wanted to know when she should start the injections. Pt wanted to start this next week. Scheduled for Nurse Visit on 10/04/24 @9am . Pt verbalized understanding.

## 2024-09-30 ENCOUNTER — Encounter: Payer: Self-pay | Admitting: Internal Medicine

## 2024-09-30 DIAGNOSIS — R200 Anesthesia of skin: Secondary | ICD-10-CM | POA: Insufficient documentation

## 2024-09-30 NOTE — Assessment & Plan Note (Signed)
 Intermittent numbness and tingling may be due to low vitamin levels. Order vitamin B12, D, TSH, HgA1c test.

## 2024-09-30 NOTE — Assessment & Plan Note (Signed)
 Overall stable but with intermittent flares. Uses tylenol  otc and can continue.

## 2024-10-01 DIAGNOSIS — Z1231 Encounter for screening mammogram for malignant neoplasm of breast: Secondary | ICD-10-CM | POA: Diagnosis not present

## 2024-10-01 LAB — HM MAMMOGRAPHY

## 2024-10-03 ENCOUNTER — Other Ambulatory Visit: Payer: Self-pay | Admitting: Internal Medicine

## 2024-10-04 ENCOUNTER — Encounter: Payer: Self-pay | Admitting: Internal Medicine

## 2024-10-04 ENCOUNTER — Ambulatory Visit (INDEPENDENT_AMBULATORY_CARE_PROVIDER_SITE_OTHER)

## 2024-10-04 DIAGNOSIS — E538 Deficiency of other specified B group vitamins: Secondary | ICD-10-CM | POA: Diagnosis not present

## 2024-10-04 MED ORDER — CYANOCOBALAMIN 1000 MCG/ML IJ SOLN
1000.0000 ug | Freq: Once | INTRAMUSCULAR | Status: AC
  Administered 2024-10-04: 1000 ug via INTRAMUSCULAR

## 2024-10-04 NOTE — Progress Notes (Signed)
 Patient visits today to receive her B12 injection/vaccine. Patient was informed and tolerated well. Patient was notified to reach out to [place] if needed.

## 2024-10-07 ENCOUNTER — Ambulatory Visit: Admitting: Podiatry

## 2024-10-07 ENCOUNTER — Encounter: Payer: Self-pay | Admitting: Podiatry

## 2024-10-07 DIAGNOSIS — D689 Coagulation defect, unspecified: Secondary | ICD-10-CM

## 2024-10-07 DIAGNOSIS — M79676 Pain in unspecified toe(s): Secondary | ICD-10-CM | POA: Diagnosis not present

## 2024-10-07 DIAGNOSIS — B351 Tinea unguium: Secondary | ICD-10-CM | POA: Diagnosis not present

## 2024-10-07 DIAGNOSIS — Q828 Other specified congenital malformations of skin: Secondary | ICD-10-CM | POA: Diagnosis not present

## 2024-10-07 NOTE — Progress Notes (Signed)
 This patient presents to the office with chief complaint of long thick painful nails and painful calluses both feet. Patient says the nails  and callus are painful walking and wearing shoes.  This patient is unable to self treat.  This patient is unable to trim her nails and calluses since she is unable to reach her nails.  She presents to the office for preventative foot care services.  General Appearance  Alert, conversant and in no acute stress.  Vascular  Dorsalis pedis and posterior tibial  pulses are  weakly palpable  bilaterally.  Capillary return is within normal limits  bilaterally. Temperature is within normal limits  bilaterally.  Neurologic  Senn-Weinstein monofilament wire test within normal limits  bilaterally. Muscle power within normal limits bilaterally.  Nails Thick disfigured discolored nails with subungual debris  from hallux to fifth toes bilaterally. No evidence of bacterial infection or drainage bilaterally.  Orthopedic  No limitations of motion  feet .  No crepitus or effusions noted.  No bony pathology or digital deformities noted.  Skin  normotropic skin with no porokeratosis noted bilaterally.  No signs of infections or ulcers noted.   Porokeratosis sub 2 and heel left foot.  Porokeratosis under !ST MPJ right foot.   Onychomycosis  Nails  B/L.  Pain in right toes  Pain in left toes Porokeratosis  B/L.  Debridement of nails both feet followed trimming the nails with dremel tool. Debride callus with # 15 blade and dremel tool.    RTC 3 months.   Cordella Bold DPM

## 2024-10-16 ENCOUNTER — Other Ambulatory Visit: Payer: Self-pay | Admitting: Internal Medicine

## 2024-10-18 ENCOUNTER — Other Ambulatory Visit: Payer: Self-pay | Admitting: Internal Medicine

## 2024-10-18 NOTE — Telephone Encounter (Unsigned)
 Copied from CRM #[id number]. Topic: Clinical - Medication Refill >> Oct 18, 2024  4:01 PM Viola F wrote: Medication: BREO ELLIPTA  100-25 MCG/ACT AEPB [[id number]]  Has the patient contacted their pharmacy? Yes (Agent: If no, request that the patient contact the pharmacy for the refill. If patient does not wish to contact the pharmacy document the reason why and proceed with request.) (Agent: If yes, when and what did the pharmacy advise?)  This is the patient's preferred pharmacy:  [facility] [phone] [address] [place],  - [address] [place] [address] Phone: [phone] Fax: [phone]  Is this the correct pharmacy for this prescription? Yes If no, delete pharmacy and type the correct one.   Has the prescription been filled recently? Yes  Is the patient out of the medication? No  Has the patient been seen for an appointment in the last year OR does the patient have an upcoming appointment? Yes  Can we respond through MyChart? Yes  Agent: Please be advised that Rx refills may take up to 3 business days. We ask that you follow-up with your pharmacy.

## 2024-10-21 ENCOUNTER — Ambulatory Visit: Admitting: Family Medicine

## 2024-10-21 ENCOUNTER — Encounter: Payer: Self-pay | Admitting: Family Medicine

## 2024-10-21 VITALS — BP 130/54 | HR 84 | Temp 98.7°F | Ht 62.0 in

## 2024-10-21 DIAGNOSIS — N3000 Acute cystitis without hematuria: Secondary | ICD-10-CM | POA: Diagnosis not present

## 2024-10-21 DIAGNOSIS — R300 Dysuria: Secondary | ICD-10-CM

## 2024-10-21 DIAGNOSIS — R1030 Lower abdominal pain, unspecified: Secondary | ICD-10-CM

## 2024-10-21 LAB — POC URINALSYSI DIPSTICK (AUTOMATED)
Bilirubin, UA: NEGATIVE
Blood, UA: NEGATIVE
Glucose, UA: NEGATIVE
Ketones, UA: NEGATIVE
Leukocytes, UA: NEGATIVE
Nitrite, UA: NEGATIVE
Protein, UA: NEGATIVE
Spec Grav, UA: 1.01 (ref 1.010–1.025)
Urobilinogen, UA: 0.2 U/dL
pH, UA: 6.5 (ref 5.0–8.0)

## 2024-10-21 MED ORDER — CEPHALEXIN 500 MG PO CAPS: 500.0000 mg | ORAL_CAPSULE | Freq: Two times a day (BID) | ORAL | 0 refills | Status: AC

## 2024-10-21 NOTE — Progress Notes (Signed)
 Subjective:     Patient ID: Jamie [name], female    DOB: July 03, 1933, 89 y.o.   MRN: [id number]  Chief Complaint  Patient presents with   Acute Visit    Lower stomach pain ongoing since 10/26 let up with medication but is back    HPI  Discussed the use of AI scribe software for clinical note transcription with the patient, who gave verbal consent to proceed.  History of Present Illness Jamie [name] is an 89 year old female who presents with abdominal pain.  Lower abdominal pain and dysuria - Lower abdominal pain described as a squeezing sensation, onset around October 25th or 26th - Pain accompanied by slight pressure and discomfort during urination - History of urinary tract infections with similar symptoms last month - Symptoms initially improved but recurred on November 19th - Current discomfort is significant - No changes in bowel habits - No nausea, vomiting, fever, chills, or hematuria - No recent vaginal discharge or irritation - Previous vaginal itching has resolved      Health Maintenance Due  Topic Date Due   DTaP/Tdap/Td (2 - Tdap) 05/17/2020   COVID-19 Vaccine (6 - 2025-26 season) 08/01/2024    Past Medical History:  Diagnosis Date   Adenocarcinoma of cecum (HCC) 09/28/2019   ANEMIA, IRON DEFICIENCY 09/10/2007   Anxiety    Arthritis    Asthma    Blood transfusion without reported diagnosis    Cataract 2008   bilateral cataract extraction   CATARACT EXTRACTION, HX OF 09/10/2007   DYSPEPSIA, HX OF 09/10/2007   GERD (gastroesophageal reflux disease)    HAIR LOSS 03/21/2009   HAY FEVER 09/10/2007   HEARING LOSS, SENSORINEURAL 09/10/2007   HYPERLIPIDEMIA 09/10/2007   HYPERTENSION 09/10/2007   LOW BACK PAIN 05/10/2008   Pain in joint, hand 03/01/2008   PEPTIC ULCER DISEASE 09/10/2007   POLYP, COLON 09/10/2007   POLYPECTOMY, HX OF 09/10/2007   Pulmonary embolism (HCC) 1- 2 non-occlusive 10/10/2019   SHINGLES 08/13/2009   SHOULDER PAIN,  LEFT 02/12/2008   TAH/BSO, HX OF 09/10/2007   Tuberculosis    PATIENT DENIES AT PHONE INTERVIEW ON 12/12/2019    Past Surgical History:  Procedure Laterality Date   ABDOMINAL HYSTERECTOMY     BREAST SURGERY     breast biopsy-benign   CATARACT EXTRACTION     COLONOSCOPY     EYE SURGERY     bilateral cataract with lens implant   IRRIGATION AND DEBRIDEMENT SEBACEOUS CYST     LAPAROSCOPIC RIGHT HEMI COLECTOMY Right 12/15/2019   Procedure: LAPAROSCOPIC RIGHT HEMI COLECTOMY WITH LYSIS OF ADHESIONS;  Surgeon: Teresa Lonni HERO, MD;  Location: [facility];  Service: General;  Laterality: Right;   TOTAL ABDOMINAL HYSTERECTOMY W/ BILATERAL SALPINGOOPHORECTOMY      Family History  Problem Relation Age of Onset   Hypertension Mother    Coronary artery disease Mother    Heart disease Mother        CHF   Cancer Son 71       prostate cancer    Cancer Son 50       prostate cancer   Diabetes Neg Hx     Social History   Socioeconomic History   Marital status: Widowed    Spouse name: Not on file   Number of children: 4   Years of education: Not on file   Highest education level: Not on file  Occupational History   Occupation: retired  Tobacco Use   Smoking status:  Former    Current packs/day: 0.00    Average packs/day: 0.2 packs/day for 25.0 years (5.0 ttl pk-yrs)    Types: Cigarettes    Start date: 12/01/1964    Quit date: 12/01/1989    Years since quitting: 34.9   Smokeless tobacco: Never   Tobacco comments:    smoked a long time ago; Quit 90 or 91/ number is approximate  Vaping Use   Vaping status: Never Used  Substance and Sexual Activity   Alcohol use: No    Alcohol/week: 0.0 standard drinks of alcohol   Drug use: No   Sexual activity: Not Currently  Other Topics Concern   Not on file  Social History Narrative   3 sons, 54,59, 59 and 1 daughter 52, 4 grandchildrenSO with multiple medical problems      1 daughter and 2 sons/2025   Social Drivers of Research Scientist (physical Sciences) Strain: Medium Risk (05/04/2024)   Overall Financial Resource Strain (CARDIA)    Difficulty of Paying Living Expenses: Somewhat hard  Food Insecurity: No Food Insecurity (05/04/2024)   Hunger Vital Sign    Worried About Running Out of Food in the Last Year: Never true    Ran Out of Food in the Last Year: Never true  Transportation Needs: No Transportation Needs (05/04/2024)   PRAPARE - Administrator, Civil Service (Medical): No    Lack of Transportation (Non-Medical): No  Physical Activity: Inactive (05/04/2024)   Exercise Vital Sign    Days of Exercise per Week: 0 days    Minutes of Exercise per Session: 0 min  Stress: No Stress Concern Present (05/04/2024)   [employer] of Occupational Health - Occupational Stress Questionnaire    Feeling of Stress : Not at all  Social Connections: Socially Isolated (05/04/2024)   Social Connection and Isolation Panel    Frequency of Communication with Friends and Family: More than three times a week    Frequency of Social Gatherings with Friends and Family: Once a week    Attends Religious Services: Never    Database Administrator or Organizations: No    Attends Banker Meetings: Never    Marital Status: Widowed  Intimate Partner Violence: Patient Unable To Answer (05/04/2024)   Humiliation, Afraid, Rape, and Kick questionnaire    Fear of Current or Ex-Partner: Patient unable to answer    Emotionally Abused: Patient unable to answer    Physically Abused: Patient unable to answer    Sexually Abused: Patient unable to answer    Outpatient Medications Prior to Visit  Medication Sig Dispense Refill   acetaminophen  (TYLENOL ) 500 MG tablet Take 500-1,000 mg by mouth every 6 (six) hours as needed (for pain.).     albuterol  (VENTOLIN  HFA) 108 (90 Base) MCG/ACT inhaler INHALE 2 PUFFS BY MOUTH EVERY 4 HOURS AS NEEDED FOR WHEEZE OR FOR SHORTNESS OF BREATH 25.5 each 1   ALPRAZolam  (XANAX ) 0.5 MG tablet TAKE 1 TABLET BY MOUTH  EVERY DAY AS NEEDED FOR ANXIETY 20 tablet 0   alum & mag hydroxide-simeth (MAALOX/MYLANTA) 200-200-20 MG/5ML suspension Take 15 mLs by mouth every 6 (six) hours as needed for indigestion or heartburn.     amLODipine  (NORVASC ) 5 MG tablet Take 1 tablet (5 mg total) by mouth daily. 90 tablet 1   atorvastatin  (LIPITOR) 40 MG tablet TAKE 1 TABLET BY MOUTH EVERY DAY 90 tablet 3   benzonatate  (TESSALON ) 200 MG capsule TAKE 1 CAPSULE (200 MG TOTAL) BY MOUTH  3 (THREE) TIMES DAILY AS NEEDED FOR COUGH. 60 capsule 0   betamethasone  dipropionate 0.05 % cream APPLY TO AFFECTED AREA TWICE A DAY 45 g 0   BREO ELLIPTA  100-25 MCG/ACT AEPB INHALE 1 PUFF BY MOUTH EVERY DAY 60 each 4   BREO ELLIPTA  100-25 MCG/ACT AEPB TAKE 1 PUFF BY MOUTH EVERY DAY 60 each 4   diclofenac  Sodium (VOLTAREN ) 1 % GEL Apply 2 g topically 4 (four) times daily. 100 g 5   ezetimibe  (ZETIA ) 10 MG tablet TAKE 1 TABLET BY MOUTH EVERY DAY 90 tablet 3   famotidine  (PEPCID ) 20 MG tablet Take 1 tablet (20 mg total) by mouth 2 (two) times daily. 180 tablet 1   furosemide  (LASIX ) 40 MG tablet Take 1 tablet (40 mg total) by mouth daily. 90 tablet 1   montelukast  (SINGULAIR ) 10 MG tablet TAKE 1 TABLET BY MOUTH EVERYDAY AT BEDTIME (Patient taking differently: Take 10 mg by mouth daily as needed.) 90 tablet 1   mupirocin  ointment (BACTROBAN ) 2 % APPLY TO AFFECTED AREA TWICE A DAY 22 g 0   nystatin  cream (MYCOSTATIN ) Apply 1 Application topically 2 (two) times daily. 100 g 0   olmesartan  (BENICAR ) 40 MG tablet TAKE 1 TABLET BY MOUTH EVERY DAY 90 tablet 3   ondansetron  (ZOFRAN -ODT) 4 MG disintegrating tablet TAKE 1 TABLET BY MOUTH EVERY 8 HOURS AS NEEDED FOR NAUSEA AND VOMITING (Patient taking differently: 4 mg daily as needed.) 60 tablet 0   potassium chloride  (KLOR-CON ) 10 MEQ tablet TAKE 1 TABLET BY MOUTH EVERY DAY 90 tablet 1   sennosides-docusate sodium (SENOKOT-S) 8.6-50 MG tablet Take 1 tablet by mouth daily as needed for constipation.      triamcinolone  cream (KENALOG ) 0.1 % APPLY TO AFFECTED AREA TOPICALLY TWICE A DAY AS NEEDED FOR SKIN IRRITATION/RASH 90 g 0   Vitamin D , Ergocalciferol , (DRISDOL ) 1.25 MG (50000 UNIT) CAPS capsule Take 1 capsule (50,000 Units total) by mouth every 7 (seven) days. 12 capsule 0   No facility-administered medications prior to visit.    Allergies  Allergen Reactions   Aspirin  Other (See Comments)    Stomach upset   Latex Itching and Rash    Review of Systems  Constitutional:  Negative for chills and fever.  Respiratory:  Negative for shortness of breath.   Cardiovascular:  Negative for chest pain, palpitations and leg swelling.  Gastrointestinal:  Positive for abdominal pain. Negative for constipation, diarrhea, nausea and vomiting.  Genitourinary:  Positive for dysuria. Negative for frequency and urgency.  Neurological:  Negative for dizziness and focal weakness.       Objective:    Physical Exam Constitutional:      General: She is not in acute distress.    Appearance: She is not ill-appearing.  HENT:     Mouth/Throat:     Mouth: Mucous membranes are moist.     Pharynx: Oropharynx is clear.  Eyes:     Extraocular Movements: Extraocular movements intact.     Conjunctiva/sclera: Conjunctivae normal.     Pupils: Pupils are equal, round, and reactive to light.  Cardiovascular:     Rate and Rhythm: Normal rate.  Pulmonary:     Effort: Pulmonary effort is normal.  Abdominal:     Palpations: Abdomen is soft.     Tenderness: There is no abdominal tenderness. There is no right CVA tenderness or left CVA tenderness.  Musculoskeletal:     Cervical back: Normal range of motion and neck supple.     Right  lower leg: No edema.     Left lower leg: No edema.  Skin:    General: Skin is warm and dry.  Neurological:     General: No focal deficit present.     Mental Status: She is alert and oriented to person, place, and time.     Comments: In wheelchair   Psychiatric:        Mood and  Affect: Mood normal.        Behavior: Behavior normal.        Thought Content: Thought content normal.      BP (!) 130/54 (BP Location: Left Arm, Patient Position: Sitting)   Pulse 84   Temp 98.7 F (37.1 C)   Ht 5' 2 (1.575 m)   BMI 32.52 kg/m  Wt Readings from Last 3 Encounters:  09/27/24 177 lb 12.8 oz (80.6 kg)  09/26/24 178 lb (80.7 kg)  06/23/24 179 lb (81.2 kg)       Assessment & Plan:   Problem List Items Addressed This Visit   None Visit Diagnoses       Acute cystitis without hematuria    -  Primary     Lower abdominal pain       Relevant Orders   POCT Urinalysis Dipstick (Automated) (Completed)   Urine Culture     Dysuria       Relevant Orders   POCT Urinalysis Dipstick (Automated) (Completed)   Urine Culture       Assessment and Plan Assessment & Plan Lower abdominal pain and dysuria Lower abdominal pain with a squeezing sensation and pressure, accompanied by dysuria. Symptoms began on October 25th or 26th, initially improved, but recurred on Wednesday. Previous urine culture did not confirm UTI, but dipstick showed leukocytes. No fever, chills, or hematuria. No significant changes in bowel habits. No tenderness over bladder or kidneys. Itching noted but resolved. Symptoms suggestive of UTI despite previous negative culture. - Obtained urine sample for culture. - Initiated antibiotic therapy due to discomfort and symptom recurrence. -Urinalysis today is not indicative of UTI. Treat empirically. Patient symptomatic.  - Keflex  ordered. Increase water intake.      I am having Carlon L. Spagnoli start on cephALEXin . I am also having her maintain her alum & mag hydroxide-simeth, acetaminophen , sennosides-docusate sodium, potassium chloride , betamethasone  dipropionate, nystatin  cream, diclofenac  Sodium, montelukast , ezetimibe , ALPRAZolam , atorvastatin , olmesartan , albuterol , mupirocin  ointment, benzonatate , ondansetron , Breo Ellipta , amLODipine , famotidine ,  furosemide , Vitamin D  (Ergocalciferol ), triamcinolone  cream, and Breo Ellipta .  Meds ordered this encounter  Medications   cephALEXin  (KEFLEX ) 500 MG capsule    Sig: Take 1 capsule (500 mg total) by mouth 2 (two) times daily.    Dispense:  10 capsule    Refill:  0    Supervising Provider:   ROLLENE NORRIS A [[id number]]

## 2024-10-21 NOTE — Patient Instructions (Signed)
 Take the antibiotic as prescribed.  Be sure you are drinking plenty of water throughout the day.  I will be in touch with your urine culture results.  Please follow-up Dr. Rollene in 2 to 3 weeks in case your lower abdominal pain does not resolve.

## 2024-10-22 LAB — URINE CULTURE: Result:: NO GROWTH

## 2024-10-24 ENCOUNTER — Ambulatory Visit: Payer: Self-pay | Admitting: Family Medicine

## 2024-10-24 NOTE — Progress Notes (Signed)
 Please reach out to her if she does not read her Mychart. Thanks

## 2024-11-04 ENCOUNTER — Ambulatory Visit

## 2024-11-07 ENCOUNTER — Ambulatory Visit

## 2024-11-07 DIAGNOSIS — E538 Deficiency of other specified B group vitamins: Secondary | ICD-10-CM

## 2024-11-07 MED ORDER — CYANOCOBALAMIN 1000 MCG/ML IJ SOLN
1000.0000 ug | Freq: Once | INTRAMUSCULAR | Status: AC
  Administered 2024-11-07: 1000 ug via INTRAMUSCULAR

## 2024-11-07 NOTE — Progress Notes (Signed)
 Pt was given B12 inject w/o any complications at this time.

## 2024-11-11 ENCOUNTER — Ambulatory Visit: Admitting: Internal Medicine

## 2024-11-11 ENCOUNTER — Encounter: Payer: Self-pay | Admitting: Internal Medicine

## 2024-11-11 VITALS — BP 124/62 | HR 78 | Temp 98.3°F | Ht 62.0 in

## 2024-11-11 DIAGNOSIS — M1712 Unilateral primary osteoarthritis, left knee: Secondary | ICD-10-CM

## 2024-11-11 DIAGNOSIS — E538 Deficiency of other specified B group vitamins: Secondary | ICD-10-CM | POA: Insufficient documentation

## 2024-11-11 DIAGNOSIS — E559 Vitamin D deficiency, unspecified: Secondary | ICD-10-CM | POA: Insufficient documentation

## 2024-11-11 NOTE — Progress Notes (Signed)
° °  Subjective:   Patient ID: Jamie [name], female    DOB: 1933/01/04, 89 y.o.   MRN: [id number]  Discussed the use of AI scribe software for clinical note transcription with the patient, who gave verbal consent to proceed.  History of Present Illness Jamie [name] is an 89 year old female who presents for follow-up after treatment for a suspected urinary infection.  She feels significantly better since her last visit. Previously experienced heart pain has resolved, although she occasionally feels soreness when moving around. She was treated with antibiotics for a suspected urinary infection, which helped significantly.  She experiences shortness of breath when walking from her house to the car, which makes her feel tired. No new chest pain, tightness, or breathing problems. Her appetite remains normal, and she has not experienced any new cough.  Joint pain remains consistent, with occasional soreness in her shoulder and hand, particularly in cold weather. No new swelling or changes in swelling in her legs.  She is currently taking vitamin D  supplements and B12 shots, which she feels have provided her with a little more energy.  Review of Systems  Constitutional:  Positive for fatigue.  HENT: Negative.    Eyes: Negative.   Respiratory:  Positive for shortness of breath. Negative for cough and chest tightness.   Cardiovascular:  Negative for chest pain, palpitations and leg swelling.  Gastrointestinal:  Negative for abdominal distention, abdominal pain, constipation, diarrhea, nausea and vomiting.  Musculoskeletal:  Positive for arthralgias.  Skin: Negative.   Neurological: Negative.   Psychiatric/Behavioral: Negative.      Objective:  Physical Exam Constitutional:      Appearance: She is well-developed.  HENT:     Head: Normocephalic and atraumatic.  Cardiovascular:     Rate and Rhythm: Normal rate and regular rhythm.  Pulmonary:     Effort: Pulmonary effort is  normal. No respiratory distress.     Breath sounds: Normal breath sounds. No wheezing or rales.  Abdominal:     General: Bowel sounds are normal. There is no distension.     Palpations: Abdomen is soft.     Tenderness: There is no abdominal tenderness.  Musculoskeletal:     Cervical back: Normal range of motion.  Skin:    General: Skin is warm and dry.  Neurological:     Mental Status: She is alert and oriented to person, place, and time.     Coordination: Coordination abnormal.     Comments: Wheelchair in office     Vitals:   11/11/24 0854  BP: 124/62  Pulse: 78  Temp: 98.3 F (36.8 C)  SpO2: 99%  Height: 5' 2 (1.575 m)    Assessment and Plan Assessment & Plan Recent Urinary tract infection  with abdominal pain Resolved with antibiotic treatment. Monitor for recurrence and report if she occurs.  Osteoarthritis of the shoulder and wrist   Chronic osteoarthritis with persistent pain, exacerbated by cold weather. No new swelling or changes. Continue current management and monitor for symptom changes.

## 2024-11-11 NOTE — Assessment & Plan Note (Signed)
 This is stable lately and she is using otc for pain as needed only.

## 2024-11-11 NOTE — Assessment & Plan Note (Signed)
 Currently taking high dose 50000 units weekly and will need repeat vitamin D  level with next labs.

## 2024-11-11 NOTE — Patient Instructions (Signed)
 We will leave things the same for now. Let us  know if the stomach pain comes back.

## 2024-11-11 NOTE — Assessment & Plan Note (Signed)
 Taking B12 shots then will transition to oral 1000 mcg daily otc B12. Will need recheck with next set of labs. Clinically feeling some improvement.

## 2024-11-15 ENCOUNTER — Other Ambulatory Visit: Payer: Self-pay | Admitting: Internal Medicine

## 2024-11-15 DIAGNOSIS — F419 Anxiety disorder, unspecified: Secondary | ICD-10-CM

## 2024-11-23 ENCOUNTER — Other Ambulatory Visit: Payer: Self-pay

## 2024-11-23 ENCOUNTER — Emergency Department (HOSPITAL_COMMUNITY)
Admission: EM | Admit: 2024-11-23 | Discharge: 2024-11-23 | Disposition: A | Discharge status: Home / Self Care | Attending: Emergency Medicine | Admitting: Emergency Medicine

## 2024-11-23 ENCOUNTER — Emergency Department (HOSPITAL_COMMUNITY)

## 2024-11-23 ENCOUNTER — Encounter (HOSPITAL_COMMUNITY): Payer: Self-pay

## 2024-11-23 DIAGNOSIS — R000 Tachycardia, unspecified: Secondary | ICD-10-CM | POA: Insufficient documentation

## 2024-11-23 DIAGNOSIS — R0602 Shortness of breath: Secondary | ICD-10-CM | POA: Insufficient documentation

## 2024-11-23 DIAGNOSIS — I10 Essential (primary) hypertension: Secondary | ICD-10-CM | POA: Diagnosis not present

## 2024-11-23 DIAGNOSIS — R002 Palpitations: Secondary | ICD-10-CM | POA: Insufficient documentation

## 2024-11-23 DIAGNOSIS — Z79899 Other long term (current) drug therapy: Secondary | ICD-10-CM | POA: Diagnosis not present

## 2024-11-23 DIAGNOSIS — Z91040 Latex allergy status: Secondary | ICD-10-CM | POA: Diagnosis not present

## 2024-11-23 LAB — CBC
HCT: 37.5 % (ref 36.0–46.0)
Hemoglobin: 12.2 g/dL (ref 12.0–15.0)
MCH: 32.3 pg (ref 26.0–34.0)
MCHC: 32.5 g/dL (ref 30.0–36.0)
MCV: 99.2 fL (ref 80.0–100.0)
Platelets: 204 K/uL (ref 150–400)
RBC: 3.78 MIL/uL — ABNORMAL LOW (ref 3.87–5.11)
RDW: 13.2 % (ref 11.5–15.5)
WBC: 6.8 K/uL (ref 4.0–10.5)
nRBC: 0 % (ref 0.0–0.2)

## 2024-11-23 LAB — BASIC METABOLIC PANEL WITH GFR
Anion gap: 14 (ref 5–15)
BUN: 17 mg/dL (ref 8–23)
CO2: 22 mmol/L (ref 22–32)
Calcium: 9.1 mg/dL (ref 8.9–10.3)
Chloride: 105 mmol/L (ref 98–111)
Creatinine, Ser: 1.1 mg/dL — ABNORMAL HIGH (ref 0.44–1.00)
GFR, Estimated: 48 mL/min — ABNORMAL LOW
Glucose, Bld: 78 mg/dL (ref 70–99)
Potassium: 4 mmol/L (ref 3.5–5.1)
Sodium: 140 mmol/L (ref 135–145)

## 2024-11-23 LAB — TROPONIN T, HIGH SENSITIVITY: Troponin T High Sensitivity: <15 ng/L (ref 0–19)

## 2024-11-23 NOTE — ED Provider Triage Note (Signed)
 Emergency Medicine Provider Triage Evaluation Note  Jamie [name] , a 89 y.o. female  was evaluated in triage.  Pt complains of palpitations and shortness of breath that lasted for approximately an hour before presenting to the ED today.  At present she does not have any shortness of breath or palpitations, but has not experienced similar type symptoms in the past.  Noted history of hyperlipidemia, hypertension, aortic atherosclerosis.  Echo done in a very 2025 showed LVEF of 72% with hyperdynamic function.  Review of Systems  Positive: As above Negative:   Physical Exam  BP (!) 160/61 (BP Location: Right Arm)   Pulse 82   Temp 98 F (36.7 C) (Oral)   Resp 14   SpO2 99%  Gen:   Awake, no distress   Resp:  Normal effort  MSK:   Moves extremities without difficulty  Other:    Medical Decision Making  Medically screening exam initiated at 2:03 PM.  Appropriate orders placed.  Jamie [name] was informed that the remainder of the evaluation will be completed by another provider, this initial triage assessment does not replace that evaluation, and the importance of remaining in the ED until their evaluation is complete.  Initial labs and imaging obtained.   Jamie [name], Jamie [name] 11/23/24 [phone]

## 2024-11-23 NOTE — ED Triage Notes (Signed)
 POV/ brought in by family/ placed in wheelchair/ pt c/o irregular HR/ denies hx of A-fib or blood thinner/ pt states she does have a hx of anxiety and takes meds daily for it/ A&OX4

## 2024-11-23 NOTE — Discharge Instructions (Signed)
 You were seen in the emergency room for palpitations Your cardiac workup did not show any evidence of a heart attack and you felt well Your blood pressure EKG chest x-ray looked okay We want you to return to the emergency department for chest pain or trouble breathing otherwise follow-up with your primary doctor Continue taking all previously prescribed medications and have a [employer]

## 2024-11-23 NOTE — ED Provider Notes (Signed)
 " [facility] AT [facility] Provider Note   CSN: [id number] Arrival date & time: 11/23/24  1301     Patient presents with: Palpitations   LACE CHENEVERT is a 89 y.o. female.  With a history of hypertension hyperlipidemia and anxiety who presents to ED for palpitations.  Patient was walking with a family member outside of a grocery store when she began to experience sensation of palpitations, fast heart rate and some shortness of breath.  The symptoms resolved spontaneously after resting.  No chest pain fevers chills recent illness.  States she feels well at this time.    Palpitations      Prior to Admission medications  Medication Sig Start Date End Date Taking? Authorizing Provider  acetaminophen  (TYLENOL ) 500 MG tablet Take 500-1,000 mg by mouth every 6 (six) hours as needed (for pain.).    [provider]  albuterol  (VENTOLIN  HFA) 108 (90 Base) MCG/ACT inhaler INHALE 2 PUFFS BY MOUTH EVERY 4 HOURS AS NEEDED FOR WHEEZE OR FOR SHORTNESS OF BREATH 05/09/24   Rollene Almarie LABOR, MD  ALPRAZolam  (XANAX ) 0.5 MG tablet TAKE 1 TABLET BY MOUTH EVERY DAY AS NEEDED FOR ANXIETY 11/18/24   Rollene Almarie LABOR, MD  alum & mag hydroxide-simeth (MAALOX/MYLANTA) 200-200-20 MG/5ML suspension Take 15 mLs by mouth every 6 (six) hours as needed for indigestion or heartburn.    [provider]  amLODipine  (NORVASC ) 5 MG tablet Take 1 tablet (5 mg total) by mouth daily. 05/18/24   Rollene Almarie LABOR, MD  atorvastatin  (LIPITOR) 40 MG tablet TAKE 1 TABLET BY MOUTH EVERY DAY 02/05/24   Chandrasekhar, Mahesh A, MD  benzonatate  (TESSALON ) 200 MG capsule TAKE 1 CAPSULE (200 MG TOTAL) BY MOUTH 3 (THREE) TIMES DAILY AS NEEDED FOR COUGH. 05/25/24   Rollene Almarie LABOR, MD  betamethasone  dipropionate 0.05 % cream APPLY TO AFFECTED AREA TWICE A DAY 06/04/22   Rollene Almarie LABOR, MD  BREO ELLIPTA  100-25 MCG/ACT AEPB INHALE 1 PUFF BY MOUTH EVERY DAY 05/25/24    Rollene Almarie LABOR, MD  BREO ELLIPTA  100-25 MCG/ACT AEPB TAKE 1 PUFF BY MOUTH EVERY DAY 10/19/24   Rollene Almarie LABOR, MD  cephALEXin  (KEFLEX ) 500 MG capsule Take 1 capsule (500 mg total) by mouth 2 (two) times daily. Patient not taking: Reported on 11/11/2024 10/21/24   Lendia Nordmann L, NP-C  diclofenac  Sodium (VOLTAREN ) 1 % GEL Apply 2 g topically 4 (four) times daily. 05/18/23   Rollene Almarie LABOR, MD  ezetimibe  (ZETIA ) 10 MG tablet TAKE 1 TABLET BY MOUTH EVERY DAY 11/30/23   Chandrasekhar, Mahesh A, MD  famotidine  (PEPCID ) 20 MG tablet Take 1 tablet (20 mg total) by mouth 2 (two) times daily. 05/18/24   Rollene Almarie LABOR, MD  furosemide  (LASIX ) 40 MG tablet Take 1 tablet (40 mg total) by mouth daily. 07/19/24   Rollene Almarie LABOR, MD  montelukast  (SINGULAIR ) 10 MG tablet TAKE 1 TABLET BY MOUTH EVERYDAY AT BEDTIME Patient taking differently: Take 10 mg by mouth daily as needed. 07/23/23   Rollene Almarie LABOR, MD  mupirocin  ointment (BACTROBAN ) 2 % APPLY TO AFFECTED AREA TWICE A DAY 05/25/24   Rollene Almarie LABOR, MD  nystatin  cream (MYCOSTATIN ) Apply 1 Application topically 2 (two) times daily. 06/20/22   Rollene Almarie LABOR, MD  olmesartan  (BENICAR ) 40 MG tablet TAKE 1 TABLET BY MOUTH EVERY DAY 03/21/24   Rollene Almarie LABOR, MD  ondansetron  (ZOFRAN -ODT) 4 MG disintegrating tablet TAKE 1 TABLET BY MOUTH EVERY 8 HOURS  AS NEEDED FOR NAUSEA AND VOMITING Patient taking differently: 4 mg daily as needed. 05/25/24   Rollene Almarie LABOR, MD  potassium chloride  (KLOR-CON ) 10 MEQ tablet TAKE 1 TABLET BY MOUTH EVERY DAY 09/23/21   Lanny Callander, MD  sennosides-docusate sodium (SENOKOT-S) 8.6-50 MG tablet Take 1 tablet by mouth daily as needed for constipation.    [provider]  triamcinolone  cream (KENALOG ) 0.1 % APPLY TO AFFECTED AREA TOPICALLY TWICE A DAY AS NEEDED FOR SKIN IRRITATION/RASH 10/04/24   Rollene Almarie LABOR, MD  Vitamin D , Ergocalciferol , (DRISDOL ) 1.25 MG (50000  UNIT) CAPS capsule Take 1 capsule (50,000 Units total) by mouth every 7 (seven) days. 09/27/24   Rollene Almarie LABOR, MD    Allergies: Aspirin  and Latex    Review of Systems  Cardiovascular:  Positive for palpitations.    Updated Vital Signs BP (!) 121/56   Pulse 66   Temp 97.9 F (36.6 C) (Oral)   Resp 19   SpO2 100%   Physical Exam Vitals and nursing note reviewed.  HENT:     Head: Normocephalic and atraumatic.  Eyes:     Pupils: Pupils are equal, round, and reactive to light.  Cardiovascular:     Rate and Rhythm: Normal rate and regular rhythm.  Pulmonary:     Effort: Pulmonary effort is normal.     Breath sounds: Normal breath sounds.  Abdominal:     Palpations: Abdomen is soft.     Tenderness: There is no abdominal tenderness.  Skin:    General: Skin is warm and dry.  Neurological:     Mental Status: She is alert.  Psychiatric:        Mood and Affect: Mood normal.     (all labs ordered are listed, but only abnormal results are displayed) Labs Reviewed  BASIC METABOLIC PANEL WITH GFR - Abnormal; Notable for the following components:      Result Value   Creatinine, Ser 1.10 (*)    GFR, Estimated 48 (*)    All other components within normal limits  CBC - Abnormal; Notable for the following components:   RBC 3.78 (*)    All other components within normal limits  TROPONIN T, HIGH SENSITIVITY    EKG: EKG Interpretation Date/Time:  Wednesday November 23 2024 13:17:58 EST Ventricular Rate:  85 PR Interval:  160 QRS Duration:  80 QT Interval:  378 QTC Calculation: 449 R Axis:   8  Text Interpretation: Normal sinus rhythm Minimal voltage criteria for LVH, may be normal variant ( R in aVL ) Borderline ECG When compared with ECG of 08-Jan-2024 08:08, PREVIOUS ECG IS PRESENT Confirmed by Pamella Sharper [phone]) on 11/23/2024 6:20:35 PM  Radiology: DG Chest 2 View Result Date: 11/23/2024 CLINICAL DATA:  Cardiac palpitations EXAM: CHEST - 2 VIEW COMPARISON:   December 11, 2023 FINDINGS: Stable cardiomediastinal silhouette. Minimal bibasilar subsegmental atelectasis or scarring is noted. Bony thorax is unremarkable. IMPRESSION: Minimal bibasilar subsegmental atelectasis or scarring. Electronically Signed   By: Lynwood Landy Raddle M.D.   On: 11/23/2024 14:15     Procedures   Medications Ordered in the ED - No data to display                                  Medical Decision Making 89 year old female with history as above presents to the ED for episodic tachycardia palpitations shortness of breath after walking out of the grocery store  today.  Resolved spontaneously after resting for a a short while.  Now asymptomatic reports feeling well.  She is in normal sinus rhythm in the 60s on my exam normotensive well-appearing.  Cardiac workup negative.  Low sufficient for ACS.  No focal consolidation on chest x-ray no evidence of dysrhythmia.  Patient would be appropriate for discharge with close PCP follow-up.  Return precautions were discussed with her and her family detail.  Amount and/or Complexity of Data Reviewed Labs: ordered. Radiology: ordered.        Final diagnoses:  Palpitations    ED Discharge Orders     None          Pamella Ozell LABOR, DO 11/23/24 1959  "

## 2024-12-02 ENCOUNTER — Other Ambulatory Visit: Payer: Self-pay | Admitting: Internal Medicine

## 2024-12-06 ENCOUNTER — Encounter: Payer: Self-pay | Admitting: Internal Medicine

## 2024-12-06 ENCOUNTER — Other Ambulatory Visit: Payer: Self-pay | Admitting: Internal Medicine

## 2024-12-06 ENCOUNTER — Other Ambulatory Visit: Payer: Self-pay | Admitting: Family Medicine

## 2024-12-06 ENCOUNTER — Ambulatory Visit: Admitting: Internal Medicine

## 2024-12-06 VITALS — BP 126/50 | HR 73 | Temp 98.9°F | Ht 62.0 in | Wt 176.0 lb

## 2024-12-06 DIAGNOSIS — E538 Deficiency of other specified B group vitamins: Secondary | ICD-10-CM | POA: Diagnosis not present

## 2024-12-06 DIAGNOSIS — E559 Vitamin D deficiency, unspecified: Secondary | ICD-10-CM

## 2024-12-06 DIAGNOSIS — F419 Anxiety disorder, unspecified: Secondary | ICD-10-CM

## 2024-12-06 DIAGNOSIS — R002 Palpitations: Secondary | ICD-10-CM | POA: Diagnosis not present

## 2024-12-06 DIAGNOSIS — N3001 Acute cystitis with hematuria: Secondary | ICD-10-CM

## 2024-12-06 LAB — VITAMIN B12: Vitamin B-12: 315 pg/mL (ref 211–911)

## 2024-12-06 LAB — VITAMIN D 25 HYDROXY (VIT D DEFICIENCY, FRACTURES): VITD: 37.55 ng/mL (ref 30.00–100.00)

## 2024-12-06 NOTE — Assessment & Plan Note (Addendum)
 1 episode and unclear if linked to any specific trigger. No recurrence. Will monitor closely for symptoms. If recurrence and/or regular will order holter monitoring and discussed with her today. Labs, EKG reviewed with her from the ER during visit.

## 2024-12-06 NOTE — Assessment & Plan Note (Signed)
 Checking vitamin D  and adjust as needed.

## 2024-12-06 NOTE — Patient Instructions (Signed)
 We will check the vitamin levels today.

## 2024-12-06 NOTE — Progress Notes (Signed)
" ° °  Subjective:   Patient ID: Jamie [name], female    DOB: 07-15-1933, 89 y.o.   MRN: [id number]  Discussed the use of AI scribe software for clinical note transcription with the patient, who gave verbal consent to proceed.  History of Present Illness Jamie [name] is an 89 year old female who presents with a recent episode of heart racing.  She experienced a sudden onset of heart racing on Christmas Eve while walking from a store to her car. The sensation was described as a 'funny feeling' and she was unable to relax. The episode persisted until she reached the emergency room, where her heart rate had slowed down. Her heart was checked in the emergency room and she was told that everything looked good.  Since the episode, she has felt better and has not experienced any further episodes of heart racing. She has been receiving vitamin B12 shots and notes feeling better after these treatments. Her recent blood work, including kidney function and blood count, was reported as normal.  Review of Systems  Constitutional: Negative.   HENT: Negative.    Eyes: Negative.   Respiratory:  Negative for cough, chest tightness and shortness of breath.   Cardiovascular:  Negative for chest pain, palpitations and leg swelling.  Gastrointestinal:  Negative for abdominal distention, abdominal pain, constipation, diarrhea, nausea and vomiting.  Musculoskeletal: Negative.   Skin: Negative.   Neurological: Negative.   Psychiatric/Behavioral: Negative.      Objective:  Physical Exam Constitutional:      Appearance: She is well-developed.  HENT:     Head: Normocephalic and atraumatic.  Cardiovascular:     Rate and Rhythm: Normal rate and regular rhythm.  Pulmonary:     Effort: Pulmonary effort is normal. No respiratory distress.     Breath sounds: Normal breath sounds. No wheezing or rales.  Abdominal:     General: Bowel sounds are normal. There is no distension.     Palpations: Abdomen is  soft.     Tenderness: There is no abdominal tenderness.  Musculoskeletal:     Cervical back: Normal range of motion.  Skin:    General: Skin is warm and dry.  Neurological:     Mental Status: She is alert and oriented to person, place, and time.     Coordination: Coordination normal.     Vitals:   12/06/24 1016  BP: (!) 126/50  Pulse: 73  Temp: 98.9 F (37.2 C)  TempSrc: Oral  SpO2: 99%  Weight: 176 lb (79.8 kg)  Height: 5' 2 (1.575 m)    "

## 2024-12-06 NOTE — Assessment & Plan Note (Signed)
Checking B12 level and adjust as needed.  

## 2024-12-07 ENCOUNTER — Ambulatory Visit: Payer: Self-pay | Admitting: Internal Medicine

## 2024-12-09 ENCOUNTER — Ambulatory Visit

## 2024-12-09 DIAGNOSIS — E538 Deficiency of other specified B group vitamins: Secondary | ICD-10-CM | POA: Diagnosis not present

## 2024-12-09 MED ORDER — CYANOCOBALAMIN 1000 MCG/ML IJ SOLN
1000.0000 ug | Freq: Once | INTRAMUSCULAR | Status: AC
  Administered 2024-12-09: 1000 ug via INTRAMUSCULAR

## 2024-12-09 NOTE — Progress Notes (Addendum)
 Patient visits today to receive her B12 injection/vaccine. Patient was informed and tolerated well. Patient was notified to reach out to [place] if needed.

## 2024-12-20 ENCOUNTER — Other Ambulatory Visit: Payer: Self-pay | Admitting: Internal Medicine

## 2024-12-29 ENCOUNTER — Other Ambulatory Visit: Payer: Self-pay | Admitting: Internal Medicine

## 2025-01-05 ENCOUNTER — Other Ambulatory Visit: Payer: Self-pay | Admitting: Internal Medicine

## 2025-01-06 ENCOUNTER — Ambulatory Visit: Admitting: Podiatry

## 2025-01-09 ENCOUNTER — Ambulatory Visit

## 2025-01-26 ENCOUNTER — Ambulatory Visit: Admitting: Podiatry

## 2025-05-05 ENCOUNTER — Ambulatory Visit
